# Patient Record
Sex: Female | Born: 1940 | Race: White | Hispanic: No | Smoking: Former smoker
Health system: Southern US, Community
[De-identification: ages and names within clinical notes are randomized; demographics above are authoritative.]

## PROBLEM LIST (undated history)

## (undated) DIAGNOSIS — I4891 Unspecified atrial fibrillation: Secondary | ICD-10-CM

## (undated) DIAGNOSIS — R002 Palpitations: Secondary | ICD-10-CM

## (undated) DIAGNOSIS — G14 Postpolio syndrome: Secondary | ICD-10-CM

## (undated) DIAGNOSIS — I499 Cardiac arrhythmia, unspecified: Secondary | ICD-10-CM

## (undated) DIAGNOSIS — I471 Supraventricular tachycardia: Secondary | ICD-10-CM

## (undated) DIAGNOSIS — A159 Respiratory tuberculosis unspecified: Secondary | ICD-10-CM

## (undated) DIAGNOSIS — J329 Chronic sinusitis, unspecified: Secondary | ICD-10-CM

## (undated) DIAGNOSIS — I10 Essential (primary) hypertension: Secondary | ICD-10-CM

## (undated) DIAGNOSIS — F419 Anxiety disorder, unspecified: Secondary | ICD-10-CM

## (undated) DIAGNOSIS — M1990 Unspecified osteoarthritis, unspecified site: Secondary | ICD-10-CM

## (undated) DIAGNOSIS — K219 Gastro-esophageal reflux disease without esophagitis: Secondary | ICD-10-CM

## (undated) DIAGNOSIS — I4719 Other supraventricular tachycardia: Secondary | ICD-10-CM

## (undated) DIAGNOSIS — F319 Bipolar disorder, unspecified: Secondary | ICD-10-CM

## (undated) DIAGNOSIS — E039 Hypothyroidism, unspecified: Secondary | ICD-10-CM

## (undated) HISTORY — DX: Supraventricular tachycardia: I47.1

## (undated) HISTORY — DX: Palpitations: R00.2

## (undated) HISTORY — DX: Other supraventricular tachycardia: I47.19

## (undated) HISTORY — PX: BREAST EXCISIONAL BIOPSY: SUR124

## (undated) HISTORY — PX: ABDOMINAL HYSTERECTOMY: SHX81

## (undated) HISTORY — PX: CATARACT EXTRACTION W/ INTRAOCULAR LENS  IMPLANT, BILATERAL: SHX1307

## (undated) HISTORY — PX: THYROIDECTOMY: SHX17

## (undated) HISTORY — PX: OTHER SURGICAL HISTORY: SHX169

## (undated) HISTORY — DX: Unspecified atrial fibrillation: I48.91

## (undated) HISTORY — DX: Postpolio syndrome: G14

## (undated) HISTORY — PX: CERVICAL LAMINECTOMY: SHX94

---

## 2003-01-11 ENCOUNTER — Encounter: Payer: Self-pay | Admitting: Family Medicine

## 2003-01-11 ENCOUNTER — Ambulatory Visit (HOSPITAL_COMMUNITY): Admission: RE | Admit: 2003-01-11 | Discharge: 2003-01-11 | Payer: Self-pay | Admitting: Family Medicine

## 2004-01-17 ENCOUNTER — Ambulatory Visit (HOSPITAL_COMMUNITY): Admission: RE | Admit: 2004-01-17 | Discharge: 2004-01-17 | Payer: Self-pay | Admitting: Family Medicine

## 2005-01-22 ENCOUNTER — Ambulatory Visit (HOSPITAL_COMMUNITY): Admission: RE | Admit: 2005-01-22 | Discharge: 2005-01-22 | Payer: Self-pay | Admitting: Family Medicine

## 2005-01-29 ENCOUNTER — Encounter: Admission: RE | Admit: 2005-01-29 | Discharge: 2005-01-29 | Payer: Self-pay | Admitting: Family Medicine

## 2006-01-28 ENCOUNTER — Ambulatory Visit (HOSPITAL_COMMUNITY): Admission: RE | Admit: 2006-01-28 | Discharge: 2006-01-28 | Payer: Self-pay | Admitting: Family Medicine

## 2007-02-09 ENCOUNTER — Ambulatory Visit (HOSPITAL_COMMUNITY): Admission: RE | Admit: 2007-02-09 | Discharge: 2007-02-09 | Payer: Self-pay | Admitting: Family Medicine

## 2008-02-11 ENCOUNTER — Ambulatory Visit (HOSPITAL_COMMUNITY): Admission: RE | Admit: 2008-02-11 | Discharge: 2008-02-11 | Payer: Self-pay | Admitting: Family Medicine

## 2008-09-22 ENCOUNTER — Emergency Department (HOSPITAL_COMMUNITY): Admission: EM | Admit: 2008-09-22 | Discharge: 2008-09-22 | Payer: Self-pay | Admitting: Emergency Medicine

## 2009-09-21 ENCOUNTER — Encounter: Admission: RE | Admit: 2009-09-21 | Discharge: 2009-09-21 | Payer: Self-pay | Admitting: Family Medicine

## 2010-06-07 ENCOUNTER — Ambulatory Visit (HOSPITAL_COMMUNITY): Admission: RE | Admit: 2010-06-07 | Discharge: 2010-06-07 | Payer: Self-pay | Admitting: Gastroenterology

## 2010-09-27 ENCOUNTER — Ambulatory Visit (HOSPITAL_COMMUNITY): Admission: RE | Admit: 2010-09-27 | Discharge: 2010-09-27 | Payer: Self-pay | Admitting: Family Medicine

## 2011-08-27 ENCOUNTER — Other Ambulatory Visit (HOSPITAL_COMMUNITY): Payer: Self-pay | Admitting: Family Medicine

## 2011-08-27 DIAGNOSIS — Z1231 Encounter for screening mammogram for malignant neoplasm of breast: Secondary | ICD-10-CM

## 2011-10-03 ENCOUNTER — Ambulatory Visit (HOSPITAL_COMMUNITY): Payer: Self-pay

## 2011-11-05 ENCOUNTER — Ambulatory Visit (HOSPITAL_COMMUNITY)
Admission: RE | Admit: 2011-11-05 | Discharge: 2011-11-05 | Disposition: A | Discharge status: Home / Self Care | Payer: BC Managed Care – PPO | Source: Ambulatory Visit | Attending: Family Medicine | Admitting: Family Medicine

## 2011-11-05 DIAGNOSIS — Z1231 Encounter for screening mammogram for malignant neoplasm of breast: Secondary | ICD-10-CM | POA: Insufficient documentation

## 2012-09-25 ENCOUNTER — Other Ambulatory Visit (HOSPITAL_COMMUNITY): Payer: Self-pay | Admitting: Family Medicine

## 2012-09-25 DIAGNOSIS — Z1231 Encounter for screening mammogram for malignant neoplasm of breast: Secondary | ICD-10-CM

## 2012-11-05 ENCOUNTER — Ambulatory Visit (HOSPITAL_COMMUNITY)
Admission: RE | Admit: 2012-11-05 | Discharge: 2012-11-05 | Disposition: A | Discharge status: Home / Self Care | Payer: BC Managed Care – PPO | Source: Ambulatory Visit | Attending: Family Medicine | Admitting: Family Medicine

## 2012-11-05 DIAGNOSIS — Z1231 Encounter for screening mammogram for malignant neoplasm of breast: Secondary | ICD-10-CM | POA: Insufficient documentation

## 2013-06-16 ENCOUNTER — Other Ambulatory Visit: Payer: Self-pay | Admitting: Gastroenterology

## 2013-06-16 DIAGNOSIS — R1310 Dysphagia, unspecified: Secondary | ICD-10-CM

## 2013-06-17 ENCOUNTER — Ambulatory Visit
Admission: RE | Admit: 2013-06-17 | Discharge: 2013-06-17 | Disposition: A | Discharge status: Home / Self Care | Payer: BC Managed Care – PPO | Source: Ambulatory Visit | Attending: Gastroenterology | Admitting: Gastroenterology

## 2013-06-17 DIAGNOSIS — R1310 Dysphagia, unspecified: Secondary | ICD-10-CM

## 2013-09-27 ENCOUNTER — Other Ambulatory Visit (HOSPITAL_COMMUNITY): Payer: Self-pay | Admitting: Family Medicine

## 2013-09-27 DIAGNOSIS — Z1231 Encounter for screening mammogram for malignant neoplasm of breast: Secondary | ICD-10-CM

## 2013-10-05 DIAGNOSIS — G14 Postpolio syndrome: Secondary | ICD-10-CM | POA: Insufficient documentation

## 2013-11-09 ENCOUNTER — Ambulatory Visit (HOSPITAL_COMMUNITY)
Admission: RE | Admit: 2013-11-09 | Discharge: 2013-11-09 | Disposition: A | Discharge status: Home / Self Care | Payer: BC Managed Care – PPO | Source: Ambulatory Visit | Attending: Family Medicine | Admitting: Family Medicine

## 2013-11-09 DIAGNOSIS — Z1231 Encounter for screening mammogram for malignant neoplasm of breast: Secondary | ICD-10-CM | POA: Insufficient documentation

## 2013-11-16 ENCOUNTER — Ambulatory Visit (HOSPITAL_COMMUNITY): Payer: BC Managed Care – PPO

## 2014-04-17 ENCOUNTER — Encounter (HOSPITAL_COMMUNITY): Payer: Self-pay | Admitting: Emergency Medicine

## 2014-04-17 ENCOUNTER — Inpatient Hospital Stay (HOSPITAL_COMMUNITY)
Admission: EM | Admit: 2014-04-17 | Discharge: 2014-04-19 | DRG: 310 | Disposition: A | Discharge status: Home / Self Care | Payer: Medicare Other | Attending: Internal Medicine | Admitting: Internal Medicine

## 2014-04-17 DIAGNOSIS — I471 Supraventricular tachycardia: Secondary | ICD-10-CM | POA: Diagnosis present

## 2014-04-17 DIAGNOSIS — E039 Hypothyroidism, unspecified: Secondary | ICD-10-CM | POA: Diagnosis present

## 2014-04-17 DIAGNOSIS — I2510 Atherosclerotic heart disease of native coronary artery without angina pectoris: Secondary | ICD-10-CM

## 2014-04-17 DIAGNOSIS — B91 Sequelae of poliomyelitis: Secondary | ICD-10-CM

## 2014-04-17 DIAGNOSIS — Z87891 Personal history of nicotine dependence: Secondary | ICD-10-CM

## 2014-04-17 DIAGNOSIS — Z882 Allergy status to sulfonamides status: Secondary | ICD-10-CM

## 2014-04-17 DIAGNOSIS — I4891 Unspecified atrial fibrillation: Principal | ICD-10-CM

## 2014-04-17 DIAGNOSIS — Z79899 Other long term (current) drug therapy: Secondary | ICD-10-CM

## 2014-04-17 DIAGNOSIS — I10 Essential (primary) hypertension: Secondary | ICD-10-CM

## 2014-04-17 DIAGNOSIS — Z7902 Long term (current) use of antithrombotics/antiplatelets: Secondary | ICD-10-CM

## 2014-04-17 DIAGNOSIS — R002 Palpitations: Secondary | ICD-10-CM | POA: Diagnosis present

## 2014-04-17 DIAGNOSIS — Z888 Allergy status to other drugs, medicaments and biological substances status: Secondary | ICD-10-CM

## 2014-04-17 DIAGNOSIS — G14 Postpolio syndrome: Secondary | ICD-10-CM

## 2014-04-17 DIAGNOSIS — I4719 Other supraventricular tachycardia: Secondary | ICD-10-CM

## 2014-04-17 HISTORY — DX: Essential (primary) hypertension: I10

## 2014-04-17 NOTE — ED Notes (Signed)
The pt has had tachycardia for years

## 2014-04-17 NOTE — ED Notes (Signed)
The pt started having a fast heart rate while in bed at 2030 tonight  With some lt sided chest pain.  No chest pain at  present

## 2014-04-18 ENCOUNTER — Telehealth: Payer: Self-pay | Admitting: Cardiology

## 2014-04-18 ENCOUNTER — Encounter (HOSPITAL_COMMUNITY): Payer: Self-pay | Admitting: Emergency Medicine

## 2014-04-18 ENCOUNTER — Emergency Department (HOSPITAL_COMMUNITY): Payer: Medicare Other

## 2014-04-18 DIAGNOSIS — G14 Postpolio syndrome: Secondary | ICD-10-CM

## 2014-04-18 DIAGNOSIS — I471 Supraventricular tachycardia: Secondary | ICD-10-CM | POA: Diagnosis present

## 2014-04-18 DIAGNOSIS — I4891 Unspecified atrial fibrillation: Principal | ICD-10-CM

## 2014-04-18 DIAGNOSIS — I2510 Atherosclerotic heart disease of native coronary artery without angina pectoris: Secondary | ICD-10-CM

## 2014-04-18 DIAGNOSIS — R002 Palpitations: Secondary | ICD-10-CM

## 2014-04-18 DIAGNOSIS — I10 Essential (primary) hypertension: Secondary | ICD-10-CM | POA: Diagnosis present

## 2014-04-18 DIAGNOSIS — B91 Sequelae of poliomyelitis: Secondary | ICD-10-CM

## 2014-04-18 DIAGNOSIS — I369 Nonrheumatic tricuspid valve disorder, unspecified: Secondary | ICD-10-CM

## 2014-04-18 LAB — COMPREHENSIVE METABOLIC PANEL
ALT: 14 U/L (ref 0–35)
AST: 29 U/L (ref 0–37)
Albumin: 3.6 g/dL (ref 3.5–5.2)
Alkaline Phosphatase: 81 U/L (ref 39–117)
BUN: 14 mg/dL (ref 6–23)
CALCIUM: 9.2 mg/dL (ref 8.4–10.5)
CO2: 26 mEq/L (ref 19–32)
Chloride: 106 mEq/L (ref 96–112)
Creatinine, Ser: 0.79 mg/dL (ref 0.50–1.10)
GFR calc Af Amer: >90 mL/min (ref >90)
GFR, EST NON AFRICAN AMERICAN: 81 mL/min — AB (ref >90)
Glucose, Bld: 101 mg/dL — ABNORMAL HIGH (ref 70–99)
Potassium: 3.8 mEq/L (ref 3.7–5.3)
SODIUM: 145 meq/L (ref 137–147)
Total Bilirubin: <0.2 mg/dL — ABNORMAL LOW (ref 0.3–1.2)
Total Protein: 7.4 g/dL (ref 6.0–8.3)

## 2014-04-18 LAB — CBC WITH DIFFERENTIAL/PLATELET
BASOS PCT: 0 % (ref 0–1)
Basophils Absolute: 0 10*3/uL (ref 0.0–0.1)
Eosinophils Absolute: 0.2 10*3/uL (ref 0.0–0.7)
Eosinophils Relative: 2 % (ref 0–5)
HCT: 40.2 % (ref 36.0–46.0)
HEMOGLOBIN: 13.4 g/dL (ref 12.0–15.0)
Lymphocytes Relative: 33 % (ref 12–46)
Lymphs Abs: 2.6 10*3/uL (ref 0.7–4.0)
MCH: 32.9 pg (ref 26.0–34.0)
MCHC: 33.3 g/dL (ref 30.0–36.0)
MCV: 98.8 fL (ref 78.0–100.0)
Monocytes Absolute: 0.6 10*3/uL (ref 0.1–1.0)
Monocytes Relative: 7 % (ref 3–12)
NEUTROS PCT: 58 % (ref 43–77)
Neutro Abs: 4.5 10*3/uL (ref 1.7–7.7)
PLATELETS: 189 10*3/uL (ref 150–400)
RBC: 4.07 MIL/uL (ref 3.87–5.11)
RDW: 12.4 % (ref 11.5–15.5)
WBC: 7.8 10*3/uL (ref 4.0–10.5)

## 2014-04-18 LAB — TSH: TSH: 0.748 u[IU]/mL (ref 0.350–4.500)

## 2014-04-18 LAB — TROPONIN I

## 2014-04-18 LAB — D-DIMER, QUANTITATIVE: D-Dimer, Quant: 0.41 ug/mL-FEU (ref 0.00–0.48)

## 2014-04-18 MED ORDER — ATENOLOL 50 MG PO TABS
50.0000 mg | ORAL_TABLET | Freq: Two times a day (BID) | ORAL | Status: DC
  Administered 2014-04-18: 50 mg via ORAL
  Filled 2014-04-18 (×3): qty 1

## 2014-04-18 MED ORDER — TEMAZEPAM 15 MG PO CAPS
30.0000 mg | ORAL_CAPSULE | Freq: Every day | ORAL | Status: DC
  Administered 2014-04-18: 30 mg via ORAL
  Filled 2014-04-18: qty 2

## 2014-04-18 MED ORDER — OMEGA-3-ACID ETHYL ESTERS 1 G PO CAPS
1.0000 g | ORAL_CAPSULE | Freq: Two times a day (BID) | ORAL | Status: DC
  Administered 2014-04-18 (×2): 1 g via ORAL
  Filled 2014-04-18 (×4): qty 1

## 2014-04-18 MED ORDER — DILTIAZEM LOAD VIA INFUSION
10.0000 mg | Freq: Once | INTRAVENOUS | Status: AC
  Administered 2014-04-18: 10 mg via INTRAVENOUS
  Filled 2014-04-18: qty 10

## 2014-04-18 MED ORDER — ZIPRASIDONE HCL 20 MG PO CAPS
20.0000 mg | ORAL_CAPSULE | Freq: Every day | ORAL | Status: DC
  Administered 2014-04-18: 20 mg via ORAL
  Filled 2014-04-18 (×2): qty 1

## 2014-04-18 MED ORDER — SODIUM CHLORIDE 0.9 % IV BOLUS (SEPSIS)
500.0000 mL | Freq: Once | INTRAVENOUS | Status: AC
  Administered 2014-04-18: 500 mL via INTRAVENOUS

## 2014-04-18 MED ORDER — ATENOLOL 25 MG PO TABS
25.0000 mg | ORAL_TABLET | Freq: Once | ORAL | Status: AC
  Administered 2014-04-18: 25 mg via ORAL
  Filled 2014-04-18: qty 1

## 2014-04-18 MED ORDER — ASPIRIN 81 MG PO CHEW
324.0000 mg | CHEWABLE_TABLET | Freq: Once | ORAL | Status: DC
  Filled 2014-04-18: qty 4

## 2014-04-18 MED ORDER — ZIPRASIDONE HCL 20 MG PO CAPS
20.0000 mg | ORAL_CAPSULE | Freq: Every day | ORAL | Status: DC
  Filled 2014-04-18: qty 1

## 2014-04-18 MED ORDER — SODIUM CHLORIDE 0.9 % IV SOLN: 250.0000 mL | INTRAVENOUS | Status: DC | PRN

## 2014-04-18 MED ORDER — ASPIRIN EC 81 MG PO TBEC
81.0000 mg | DELAYED_RELEASE_TABLET | Freq: Every day | ORAL | Status: DC
  Filled 2014-04-18 (×2): qty 1

## 2014-04-18 MED ORDER — ATENOLOL 25 MG PO TABS
25.0000 mg | ORAL_TABLET | Freq: Two times a day (BID) | ORAL | Status: DC
  Administered 2014-04-18: 25 mg via ORAL
  Filled 2014-04-18 (×2): qty 1

## 2014-04-18 MED ORDER — LEVOTHYROXINE SODIUM 100 MCG PO TABS
100.0000 ug | ORAL_TABLET | Freq: Every day | ORAL | Status: DC
  Administered 2014-04-18 – 2014-04-19 (×2): 100 ug via ORAL
  Filled 2014-04-18 (×4): qty 1

## 2014-04-18 MED ORDER — SODIUM CHLORIDE 0.9 % IJ SOLN: 3.0000 mL | INTRAMUSCULAR | Status: DC | PRN

## 2014-04-18 MED ORDER — ACYCLOVIR 400 MG PO TABS
400.0000 mg | ORAL_TABLET | Freq: Every day | ORAL | Status: DC
  Administered 2014-04-18: 400 mg via ORAL
  Filled 2014-04-18 (×3): qty 1

## 2014-04-18 MED ORDER — ATENOLOL 50 MG PO TABS: 50.0000 mg | ORAL_TABLET | Freq: Two times a day (BID) | ORAL | Status: DC

## 2014-04-18 MED ORDER — DILTIAZEM HCL 100 MG IV SOLR
5.0000 mg/h | INTRAVENOUS | Status: DC
  Administered 2014-04-18 (×2): 5 mg/h via INTRAVENOUS

## 2014-04-18 MED ORDER — OFF THE BEAT BOOK
Freq: Once | Status: AC
  Administered 2014-04-18: 14:00:00
  Filled 2014-04-18: qty 1

## 2014-04-18 MED ORDER — SODIUM CHLORIDE 0.9 % IJ SOLN: 3.0000 mL | Freq: Two times a day (BID) | INTRAMUSCULAR | Status: DC

## 2014-04-18 NOTE — Telephone Encounter (Signed)
Per Dr Marlou Porch he will add the pt to 04/20/14 schedule.  The pt is aware of appointment and will call back to get our address as she does not have anything to write on at this time.

## 2014-04-18 NOTE — H&P (Signed)
PCP:   Jonathon Bellows, MD   Chief Complaint:  palpitations  HPI: 73 yo female with h/o PAT usually controlled well with her atenolol, htn, cad comes in with one day of palpitations and she felt her heart really racing fast.  She thought it was her paroxysmal atrial tachycardia some came to the ED.  She has not had a problem with her PAT in years.  Denies any recent illnessess, no fevers/n/v/d.  No sob.  No cp.  No swelling.  No prior h/o irregular heartbeat.  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  She was just loaded and placed on a cardizem gtt, her bp is normal and rate is down to the 80s from 140s.  She no longer feels any palpitations.  Review of Systems:  Positive and negative as per HPI otherwise all other systems are negative  Past Medical History: Past Medical History  Diagnosis Date  . Hypertension   . Coronary artery disease    History reviewed. No pertinent past surgical history.  Medications: Prior to Admission medications   Not on File    Allergies:   Allergies  Allergen Reactions  . Sulfa Antibiotics     Childhood allergy  . Ciprocinonide [Fluocinolone] Rash    Social History:  reports that she has quit smoking. She does not have any smokeless tobacco history on file. She reports that she drinks alcohol. Her drug history is not on file.  Family History: History reviewed. No pertinent family history.  Physical Exam: Filed Vitals:   04/18/14 0200 04/18/14 0215 04/18/14 0218 04/18/14 0219  BP: 112/95 94/36 99/60    Pulse: 77 75 75 91  Temp:      TempSrc:      Resp: 19 21 22 20   Height:      Weight:      SpO2: 96% 96% 95% 90%   General appearance: alert, cooperative and no distress Head: Normocephalic, without obvious abnormality, atraumatic Eyes: negative Nose: Nares normal. Septum midline. Mucosa  normal. No drainage or sinus tenderness. Neck: no JVD and supple, symmetrical, trachea midline Lungs: clear to auscultation bilaterally Heart: irregularly irregular rhythm Abdomen: soft, non-tender; bowel sounds normal; no masses,  no organomegaly Extremities: extremities normal, atraumatic, no cyanosis or edema Pulses: 2+ and symmetric Skin: Skin color, texture, turgor normal. No rashes or lesions Neurologic: Grossly normal    Labs on Admission:   Recent Labs  04/17/14 2352  NA 145  K 3.8  CL 106  CO2 26  GLUCOSE 101*  BUN 14  CREATININE 0.79  CALCIUM 9.2    Recent Labs  04/17/14 2352  AST 29  ALT 14  ALKPHOS 81  BILITOT <0.2*  PROT 7.4  ALBUMIN 3.6    Recent Labs  04/17/14 2352  WBC 7.8  NEUTROABS 4.5  HGB 13.4  HCT 40.2  MCV 98.8  PLT 189    Recent Labs  04/17/14 2352  TROPONINI <0.30   Radiological Exams on Admission: Dg Chest 2 View  04/18/2014   CLINICAL DATA:  TACHYCARDIA  EXAM: CHEST  2 VIEW  COMPARISON:  None available  FINDINGS: The cardiac and mediastinal silhouettes are within normal limits.  The lungs are normally inflated. No airspace consolidation, pleural effusion, or pulmonary edema is identified. There is no pneumothorax.  No acute osseous abnormality identified.  IMPRESSION: No  acute cardiopulmonary abnormality.   Electronically Signed   By: Jeannine Boga M.D.   On: 04/18/2014 00:44    Assessment/Plan  73 yo female with new onset atrial fib with rvr with also h/o PAT/htn/CAD  Principal Problem:   Atrial fibrillation with RVR-  Will give an extra dose of her atenolol right now, and hopefully will be able to wean her cardizem quickly tonight, she is only on 5mg  /hr right now.  No evidence of chf /pulm edema.  Will ck echo in am.  Can have further f/u with her cardiologist if we can get her rate controlled tonight and possible d/c tom.  Her [place] score is 1.  Would put on aspirin.  Will also ck tsh level.  Active Problems:  Stable  unless o/w noted.   Palpitations   Coronary artery disease   Hypertension   PAT (paroxysmal atrial tachycardia)   Post-polio syndrome  obs to stepdown due to drip.  If can titrate off in next several hours in the ED (no stepdown beds) will downgrade to tele bed.   Full code.   Shuronda Santino A Shanon Brow 04/18/2014, 2:25 AM

## 2014-04-18 NOTE — Progress Notes (Signed)
  Echocardiogram 2D Echocardiogram has been performed.  Carney Corners 04/18/2014, 1:39 PM

## 2014-04-18 NOTE — ED Notes (Addendum)
Pt converted to NSR.  Admitting MD paged and responded.  Pt continues to deny chest pain.

## 2014-04-18 NOTE — Progress Notes (Addendum)
[facility] TEAM 1 - Stepdown/ICU TEAM Progress Note  Kaitlyn Hoover HCW:[id number] DOB: 1941-03-29 DOA: 04/17/2014 PCP: Jonathon Bellows, MD  Admit HPI / Brief Narrative: 73 yo female with hx of PAT usually controlled well with atenolol, HTN, and CAD who came in with a one day hx of palpitations. She came to the ED. She had not had a problem with her PAT in years. Denied any recent illnessess, no fevers, n/v/d, sob, or cp. No swelling. She was loaded and placed on a cardizem gtt.  HPI/Subjective: Pt is seen for a f/u visit.  Assessment/Plan:  Afib w/ RVR - prior hx of PAT Has converted back to NSR - off cardizem gtt - has been scheduled to f/u w/ Dr. Marlou Porch at his office on 04/20/14 as a work in - TSH normal/at goal - lytes normal - D-dimer normal - monitor tele overnight - CHADS2VASC score is 3, suggesting anticoag would be appropriate should she return to afib or prove to have parox afib  HTN Watch BP to assure it tolerates increased atenolol dosing  Hypothyroid  TSH at goal - no change in dosing   Reported hx of CAD No chest pain  Post-polio syndrome  Code Status: FULL Family Communication: no family present at time of exam Disposition Plan: probable d/c home 6/2 if remains in NSR w/ Cardiology f/u as discussed above on 04/20/14  Consultants: none  Procedures: TTE - 04/18/14 - pending  Antibiotics: none  DVT prophylaxis: SCDs  Objective: Blood pressure 143/60, pulse 57, temperature 97.5 F (36.4 C), temperature source Oral, resp. rate 16, height 5\' 3"  (1.6 m), weight 56.79 kg (125 lb 3.2 oz), SpO2 100.00%.  Intake/Output Summary (Last 24 hours) at 04/18/14 1716 Last data filed at 04/18/14 0359  Gross per 24 hour  Intake      0 ml  Output    300 ml  Net   -300 ml   Exam: F/U exam completed  Data Reviewed: Basic Metabolic Panel:  Recent Labs Lab 04/17/14 2352  NA 145  K 3.8  CL 106  CO2 26  GLUCOSE 101*  BUN 14  CREATININE 0.79  CALCIUM 9.2   Liver  Function Tests:  Recent Labs Lab 04/17/14 2352  AST 29  ALT 14  ALKPHOS 81  BILITOT <0.2*  PROT 7.4  ALBUMIN 3.6   CBC:  Recent Labs Lab 04/17/14 2352  WBC 7.8  NEUTROABS 4.5  HGB 13.4  HCT 40.2  MCV 98.8  PLT 189   Cardiac Enzymes:  Recent Labs Lab 04/17/14 2352  TROPONINI <0.30   Studies:  Recent x-ray studies have been reviewed in detail by the Attending Physician  Scheduled Meds:  Scheduled Meds: . acyclovir  400 mg Oral Daily  . aspirin  324 mg Oral Once  . aspirin EC  81 mg Oral Daily  . atenolol  25 mg Oral BID  . levothyroxine  100 mcg Oral Q breakfast  . omega-3 acid ethyl esters  1 g Oral BID  . sodium chloride  3 mL Intravenous Q12H  . temazepam  30 mg Oral Daily  . ziprasidone  20 mg Oral Q2000    Time spent on care of this patient: 25+ mins   Cherene Altes , MD   [facility] Office  [phone] Pager - Text Page per Amion as per below:  On-Call/Text Page:      [email or web address]      password [email or web address]  If 7PM-7AM, please contact night-coverage [email or web address] Password [email or web address]  04/18/2014, 5:16 PM   LOS: 1 day

## 2014-04-18 NOTE — ED Provider Notes (Signed)
CSN: [id number]     Arrival date & time 04/17/14  2329 History   First MD Initiated Contact with Patient 04/17/14 2347     Chief Complaint  Patient presents with  . Tachycardia     (Consider location/radiation/quality/duration/timing/severity/associated sxs/prior Treatment) Patient is a 73 y.o. female presenting with palpitations. The history is provided by the patient. No language interpreter was used.  Palpitations Palpitations quality:  Fast Onset quality:  Sudden Timing:  Constant Progression:  Unchanged Chronicity:  Recurrent Context: not anxiety, not caffeine and not hyperventilation   Relieved by:  Nothing Worsened by:  Nothing tried Ineffective treatments:  None tried Associated symptoms: no back pain, no cough, no leg pain and no near-syncope   Risk factors: no diabetes mellitus     Past Medical History  Diagnosis Date  . Hypertension   . Coronary artery disease    History reviewed. No pertinent past surgical history. No family history on file. History  Substance Use Topics  . Smoking status: Former Research scientist (life sciences)  . Smokeless tobacco: Not on file  . Alcohol Use: Yes   OB History   Grav Para Term Preterm Abortions TAB SAB Ect Mult Living                 Review of Systems  Constitutional: Negative for fever.  Respiratory: Negative for cough.   Cardiovascular: Positive for palpitations. Negative for leg swelling and near-syncope.  Musculoskeletal: Negative for back pain.  All other systems reviewed and are negative.     Allergies  Ciprocinonide and Sulfa antibiotics  Home Medications   Prior to Admission medications   Not on File   BP 101/73  Pulse 106  Temp(Src) 98.2 F (36.8 C) (Oral)  Resp 21  Ht 5\' 3"  (1.6 m)  Wt 125 lb 3.2 oz (56.79 kg)  BMI 22.18 kg/m2  SpO2 98% Physical Exam  Constitutional: She is oriented to person, place, and time. She appears well-developed and well-nourished. No distress.  HENT:  Head: Normocephalic and atraumatic.   Mouth/Throat: Oropharynx is clear and moist.  Eyes: Conjunctivae are normal. Pupils are equal, round, and reactive to light.  Neck: Normal range of motion. Neck supple.  Cardiovascular: An irregular rhythm present. Tachycardia present.   Pulmonary/Chest: Effort normal and breath sounds normal. She has no wheezes. She has no rales.  Abdominal: Soft. Bowel sounds are normal. There is no tenderness. There is no rebound and no guarding.  Musculoskeletal: Normal range of motion. She exhibits no edema and no tenderness.  Neurological: She is alert and oriented to person, place, and time.  Skin: Skin is warm and dry.  Psychiatric: She has a normal mood and affect.    ED Course  Procedures (including critical care time) Labs Review Labs Reviewed  COMPREHENSIVE METABOLIC PANEL - Abnormal; Notable for the following:    Glucose, Bld 101 (*)    Total Bilirubin <0.2 (*)    GFR calc non Af Amer 81 (*)    All other components within normal limits  CBC WITH DIFFERENTIAL  TROPONIN I  D-DIMER, QUANTITATIVE    Imaging Review Dg Chest 2 View  04/18/2014   CLINICAL DATA:  TACHYCARDIA  EXAM: CHEST  2 VIEW  COMPARISON:  None available  FINDINGS: The cardiac and mediastinal silhouettes are within normal limits.  The lungs are normally inflated. No airspace consolidation, pleural effusion, or pulmonary edema is identified. There is no pneumothorax.  No acute osseous abnormality identified.  IMPRESSION: No acute cardiopulmonary abnormality.   Electronically  Signed   By: Jeannine Boga M.D.   On: 04/18/2014 00:44     EKG Interpretation   Date/Time:  Sunday Apr 17 2014 23:36:11 EDT Ventricular Rate:  133 PR Interval:    QRS Duration: 86 QT Interval:  336 QTC Calculation: 500 R Axis:   66 Text Interpretation:  Atrial fibrillation with rapid ventricular response  ST \\T \ T wave changes  Confirmed by [facility]  MD, Sausha Raymond ([id number]) on  04/18/2014 12:37:12 AM      MDM   Final diagnoses:  None     Results for orders placed during the hospital encounter of 04/17/14  CBC WITH DIFFERENTIAL      Result Value Ref Range   WBC 7.8  4.0 - 10.5 K/uL   RBC 4.07  3.87 - 5.11 MIL/uL   Hemoglobin 13.4  12.0 - 15.0 g/dL   HCT 40.2  36.0 - 46.0 %   MCV 98.8  78.0 - 100.0 fL   MCH 32.9  26.0 - 34.0 pg   MCHC 33.3  30.0 - 36.0 g/dL   RDW 12.4  11.5 - 15.5 %   Platelets 189  150 - 400 K/uL   Neutrophils Relative % 58  43 - 77 %   Neutro Abs 4.5  1.7 - 7.7 K/uL   Lymphocytes Relative 33  12 - 46 %   Lymphs Abs 2.6  0.7 - 4.0 K/uL   Monocytes Relative 7  3 - 12 %   Monocytes Absolute 0.6  0.1 - 1.0 K/uL   Eosinophils Relative 2  0 - 5 %   Eosinophils Absolute 0.2  0.0 - 0.7 K/uL   Basophils Relative 0  0 - 1 %   Basophils Absolute 0.0  0.0 - 0.1 K/uL  COMPREHENSIVE METABOLIC PANEL      Result Value Ref Range   Sodium 145  137 - 147 mEq/L   Potassium 3.8  3.7 - 5.3 mEq/L   Chloride 106  96 - 112 mEq/L   CO2 26  19 - 32 mEq/L   Glucose, Bld 101 (*) 70 - 99 mg/dL   BUN 14  6 - 23 mg/dL   Creatinine, Ser 0.79  0.50 - 1.10 mg/dL   Calcium 9.2  8.4 - 10.5 mg/dL   Total Protein 7.4  6.0 - 8.3 g/dL   Albumin 3.6  3.5 - 5.2 g/dL   AST 29  0 - 37 U/L   ALT 14  0 - 35 U/L   Alkaline Phosphatase 81  39 - 117 U/L   Total Bilirubin <0.2 (*) 0.3 - 1.2 mg/dL   GFR calc non Af Amer 81 (*) >90 mL/min   GFR calc Af Amer >90  >90 mL/min  TROPONIN I      Result Value Ref Range   Troponin I <0.30  <0.30 ng/mL  D-DIMER, QUANTITATIVE      Result Value Ref Range   D-Dimer, Quant 0.41  0.00 - 0.48 ug/mL-FEU   Dg Chest 2 View  04/18/2014   CLINICAL DATA:  TACHYCARDIA  EXAM: CHEST  2 VIEW  COMPARISON:  None available  FINDINGS: The cardiac and mediastinal silhouettes are within normal limits.  The lungs are normally inflated. No airspace consolidation, pleural effusion, or pulmonary edema is identified. There is no pneumothorax.  No acute osseous abnormality identified.  IMPRESSION: No acute  cardiopulmonary abnormality.   Electronically Signed   By: Jeannine Boga M.D.   On: 04/18/2014 00:44   Medications  diltiazem (CARDIZEM)  1 mg/mL load via infusion 10 mg (not administered)    And  diltiazem (CARDIZEM) 100 mg in dextrose 5 % 100 mL infusion (not administered)  aspirin chewable tablet 324 mg (not administered)       Aniah Pauli Alfonso Patten, MD 04/18/14 7846

## 2014-04-18 NOTE — Telephone Encounter (Signed)
New message          Pt has a new onset of afib and will be discharged from the hospital on tomorrow / pt states she is a dr Marlou Porch pt and would like to make an appt asap with him / there are no open slots this week and he is not here next week / Can you work pt in?

## 2014-04-19 MED ORDER — CLOPIDOGREL BISULFATE 75 MG PO TABS: 75.0000 mg | ORAL_TABLET | Freq: Every day | ORAL | Status: DC

## 2014-04-19 MED ORDER — ATENOLOL 50 MG PO TABS: 50.0000 mg | ORAL_TABLET | Freq: Two times a day (BID) | ORAL | Status: DC

## 2014-04-19 NOTE — Discharge Summary (Signed)
Physician Discharge Summary  Kaitlyn Hoover UYQ:[id number] DOB: 03-10-1941 DOA: 04/17/2014  PCP: [name], D, MD  Admit date: 04/17/2014 Discharge date: 04/19/2014  Recommendations for Outpatient Follow-up:  1. Pt will need to follow up with PCP in 2 weeks post discharge 2. Please obtain BMP  3. F/u Dr. Marlou Porch on 04/20/14 @230PM    Discharge Diagnoses:  Afib w/ RVR - prior hx of PAT  -Initially started on Cardizem drip with spontaneous conversion to NSR -off cardizem gtt - has been scheduled to f/u w/ Dr. Marlou Porch at his office on 04/20/14@ 2:30pm  -lytes normal -  -D-dimer normal -CHADS2VASC score is 3,  -Discussed the risks, benefits, and alternatives of anticoagulation--she is hesitant to start anticoagulation at this time--she wishes to followup with her cardiologist Dr. Marlou Porch to make final determination -Patient has been ASA due to prior hx of GI irritation and N/V -Agreeable to starting plavix-->home with plavix  -Atenolol has been increased to 50 mg twice a day for blood pressure as well as HR HTN  -Patient is tolerating increased atenolol dose Hypothyroid  TSH at goal--0.748 Reported hx of CAD?  No chest pain  Post-polio syndrome   Discharge Condition: Stable  Disposition:  Follow-up Information   Follow up with Candee Furbish, MD. (April 20, 2014 @ 230PM)    Specialty:  Cardiology   Contact information:   [address] [place] [address] [phone]      home  Diet: Heart healthy Wt Readings from Last 3 Encounters:  04/17/14 56.79 kg (125 lb 3.2 oz)    History of present illness:  73 year old female with a history of PAT, hypertension, hypothyroidism, reported CAD presented to the emergency department due to palpitations. She denies any recent illnesses. There are no fevers, chills, nausea, vomiting, diarrhea, shortness of breath, chest pain, dizziness, syncope.  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 EKG in the emergency department suggested atrial fibrillation with RVR with a heart rate of 131. The patient was started on a Cardizem drip. The patient spontaneously converted to sinus rhythm. Her atenolol was increased to 50 mg twice a day. The patient remained in sinus rhythm. The patient was offered anticoagulation with discussion of the risks, benefits, and alternatives. The patient has an appointment to see her cardiologist, Dr. Michel Santee on 04/20/2014. She would prefer to more definitively discuss the issue with her cardiologist prior to making a definitive decision. The patient refused to take aspirin as it has caused nausea and vomiting and GI irritation in the past. She was started on Plavix. Her heart rate remained controlled and she remained hemodynamically stable.     Discharge Exam: Filed Vitals:   04/19/14 0500  BP: 156/66  Pulse: 76  Temp: 98 F (36.7 C)  Resp: 16   Filed Vitals:   04/18/14 1006 04/18/14 1137 04/18/14 2031 04/19/14 0500  BP: 133/65 143/60 152/78 156/66  Pulse:  57 61 76  Temp: 98 F (36.7 C) 97.5 F (36.4 C) 98 F (36.7 C) 98 F (36.7 C)  TempSrc: Oral Oral Oral Oral  Resp: 17 16 16 16   Height:      Weight:      SpO2: 97% 100% 100% 96%   General: A&O x 3, NAD, pleasant, cooperative Cardiovascular: RRR, no rub, no gallop, no S3 Respiratory: CTAB, no wheeze, no  rhonchi Abdomen:soft, nontender, nondistended, positive bowel sounds Extremities: No edema, No lymphangitis, no petechiae  Discharge Instructions      Discharge Instructions   Diet - low sodium heart healthy    Complete by:  As directed      Increase activity slowly    Complete by:  As directed             Medication List         acyclovir 400 MG tablet  Commonly known as:  ZOVIRAX  Take 400 mg by mouth daily.     atenolol 50 MG tablet  Commonly known as:  TENORMIN    Take 1 tablet (50 mg total) by mouth 2 (two) times daily.     clopidogrel 75 MG tablet  Commonly known as:  PLAVIX  Take 1 tablet (75 mg total) by mouth daily with breakfast.     fexofenadine 180 MG tablet  Commonly known as:  ALLEGRA  Take 180 mg by mouth daily.     levothyroxine 100 MCG tablet  Commonly known as:  SYNTHROID, LEVOTHROID  Take 100 mcg by mouth daily.     omeprazole 20 MG capsule  Commonly known as:  PRILOSEC  Take 20 mg by mouth daily.     temazepam 30 MG capsule  Commonly known as:  RESTORIL  Take 30 mg by mouth daily.     ziprasidone 20 MG capsule  Commonly known as:  GEODON  Take 20 mg by mouth daily.         The results of significant diagnostics from this hospitalization (including imaging, microbiology, ancillary and laboratory) are listed below for reference.    Significant Diagnostic Studies: Dg Chest 2 View  04/18/2014   CLINICAL DATA:  TACHYCARDIA  EXAM: CHEST  2 VIEW  COMPARISON:  None available  FINDINGS: The cardiac and mediastinal silhouettes are within normal limits.  The lungs are normally inflated. No airspace consolidation, pleural effusion, or pulmonary edema is identified. There is no pneumothorax.  No acute osseous abnormality identified.  IMPRESSION: No acute cardiopulmonary abnormality.   Electronically Signed   By: Jeannine Boga M.D.   On: 04/18/2014 00:44     Microbiology: No results found for this or any previous visit (from the past 240 hour(s)).   Labs: Basic Metabolic Panel:  Recent Labs Lab 04/17/14 2352  NA 145  K 3.8  CL 106  CO2 26  GLUCOSE 101*  BUN 14  CREATININE 0.79  CALCIUM 9.2   Liver Function Tests:  Recent Labs Lab 04/17/14 2352  AST 29  ALT 14  ALKPHOS 81  BILITOT <0.2*  PROT 7.4  ALBUMIN 3.6   No results found for this basename: LIPASE, AMYLASE,  in the last 168 hours No results found for this basename: AMMONIA,  in the last 168 hours CBC:  Recent Labs Lab 04/17/14 2352  WBC  7.8  NEUTROABS 4.5  HGB 13.4  HCT 40.2  MCV 98.8  PLT 189   Cardiac Enzymes:  Recent Labs Lab 04/17/14 2352  TROPONINI <0.30   BNP: No components found with this basename: POCBNP,  CBG: No results found for this basename: GLUCAP,  in the last 168 hours  Time coordinating discharge:  Greater than 30 minutes  Signed:  Orson Eva, DO [facility] Pager: [phone] 04/19/2014, 8:37 AM

## 2014-04-20 ENCOUNTER — Other Ambulatory Visit: Payer: Self-pay | Admitting: *Deleted

## 2014-04-20 ENCOUNTER — Ambulatory Visit (INDEPENDENT_AMBULATORY_CARE_PROVIDER_SITE_OTHER): Payer: Medicare Other | Admitting: Cardiology

## 2014-04-20 ENCOUNTER — Encounter: Payer: Self-pay | Admitting: *Deleted

## 2014-04-20 VITALS — BP 150/80 | HR 60 | Ht 63.0 in | Wt 125.8 lb

## 2014-04-20 DIAGNOSIS — I4891 Unspecified atrial fibrillation: Secondary | ICD-10-CM

## 2014-04-20 DIAGNOSIS — I10 Essential (primary) hypertension: Secondary | ICD-10-CM

## 2014-04-20 MED ORDER — APIXABAN 5 MG PO TABS: 5.0000 mg | ORAL_TABLET | Freq: Two times a day (BID) | ORAL | Status: DC

## 2014-04-20 MED ORDER — ATENOLOL 50 MG PO TABS: 50.0000 mg | ORAL_TABLET | Freq: Two times a day (BID) | ORAL | Status: DC

## 2014-04-20 NOTE — Patient Instructions (Signed)
Your physician recommends that you return for lab work in: 6 months : CBC,BMET  Your physician has recommended you make the following change in your medication:  1.  START ELIQUIS 5 MG TWICE DAILY 2. STOP PLAVIX  Your physician recommends that you schedule a follow-up appointment in: [facility]   Your physician wants you to follow-up in: 6 MONTHS WITH DR. Marlou Porch You will receive a reminder letter in the mail two months in advance. If you don't receive a letter, please call our office to schedule the follow-up appointment.

## 2014-04-20 NOTE — Progress Notes (Signed)
Utilization review completed- retro 

## 2014-04-20 NOTE — Progress Notes (Signed)
[facility] [address] [place], [place]  [address] Phone: [phone] Fax:  [phone]  Date:  04/20/2014   ID:  Kaitlyn Hoover, DOB 04/18/1941, MRN [id number]  PCP:  Jonathon Bellows, MD   History of Present Illness: Kaitlyn Hoover is a 73 y.o. female Here for the evaluation of atrial fibrillation with recent hospitalization, discharged on 04/19/14. She was initially started on Cardizem drip with spontaneous conversion to sinus rhythm. Lab work was normal. AFIB was 133.  Usually has SVT which can be terminated with Valsalva. Tachy is usually quite fast.   This atrial fibrillation episode was symptomatic for her, he was not prompted by any fevers, recent surgeries, thyroid issues. She has one glass of one per night.  After she converted, she felt well.  Wt Readings from Last 3 Encounters:  04/20/14 125 lb 12.8 oz (57.063 kg)  04/17/14 125 lb 3.2 oz (56.79 kg)     Past Medical History  Diagnosis Date  . Hypertension   . Coronary artery disease   . PAT (paroxysmal atrial tachycardia)   . Atrial fibrillation with RVR   . Post-polio syndrome   . Palpitations     No past surgical history on file.  Current Outpatient Prescriptions  Medication Sig Dispense Refill  . acyclovir (ZOVIRAX) 400 MG tablet Take 400 mg by mouth daily.       . Ascorbic Acid (VITAMIN C) 1000 MG tablet Take 1,000 mg by mouth daily.      Marland Kitchen atenolol (TENORMIN) 50 MG tablet Take 1 tablet (50 mg total) by mouth 2 (two) times daily.  60 tablet  0  . b complex vitamins tablet Take 1 tablet by mouth daily.      . calcium carbonate 1250 MG capsule Take 1,250 mg by mouth 2 (two) times daily with a meal.      . cholecalciferol (VITAMIN D) 1000 UNITS tablet Take 1,000 Units by mouth daily.      . fexofenadine (ALLEGRA) 180 MG tablet Take 180 mg by mouth daily.      . Fish Oil-Cholecalciferol (FISH OIL + D3) 1000-1000 MG-UNIT CAPS Take by mouth.      . levothyroxine (SYNTHROID, LEVOTHROID) 100 MCG tablet  Take 100 mcg by mouth daily.      . magnesium 30 MG tablet Take 500 mg by mouth 2 (two) times daily.      Marland Kitchen omeprazole (PRILOSEC) 20 MG capsule Take 20 mg by mouth daily.      . temazepam (RESTORIL) 30 MG capsule Take 30 mg by mouth daily.      . ziprasidone (GEODON) 20 MG capsule Take 20 mg by mouth daily.      . clopidogrel (PLAVIX) 75 MG tablet Take 1 tablet (75 mg total) by mouth daily with breakfast.  30 tablet  0   No current facility-administered medications for this visit.    Allergies:    Allergies  Allergen Reactions  . Sulfa Antibiotics     Childhood allergy  . Ciprocinonide [Fluocinolone] Rash    Social History:  The patient  reports that she has quit smoking. She does not have any smokeless tobacco history on file. She reports that she drinks alcohol.   No family history on file.Mother died in 49's from accident. Has HTN.   ROS:  Please see the history of present illness.      All other systems reviewed and negative.   PHYSICAL EXAM: VS:  BP 150/80  Pulse 60  Ht 5\' 3"  (1.6 m)  Wt 125 lb 12.8 oz (57.063 kg)  BMI 22.29 kg/m2 Well nourished, thin in no acute distress HEENT: normal, [place]/AT, EOMI, hearing aids, mild speech impairment Neck: no JVD, normal carotid upstroke, no bruit Cardiac:  normal S1, S2; RRR; no murmur Lungs:  clear to auscultation bilaterally, no wheezing, rhonchi or rales Abd: soft, nontender, no hepatomegaly, no bruits Ext: no edema, 2+ distal pulses Skin: warm and dry GU: deferred Neuro: no focal abnormalities noted, AAO x 3  EKG: 04/18/14 at 10:18 AM, sinus bradycardia rate 57, no other abnormalities.   ECHO: 04/18/14 - Left ventricle: The cavity size was normal. Wall thickness was normal. Systolic function was normal. The estimated ejection fraction was in the range of 60% to 65%. Wall motion was normal; there were no regional wall motion abnormalities. - Left atrium: The atrium was mildly dilated. - Atrial septum: No defect or patent foramen  ovale was identified. - Pulmonary arteries: PA peak pressure: 32 mm Hg (S).  Labs: 04/17/14-TSH normal, creatinine 0.79. Prior medical records reviewed  ASSESSMENT AND PLAN:  1. New onset atrial fibrillation, paroxysmal with prompt conversion. CHADS-VASc at least 3 (Female, AGE, HTN). We will start Eliquis. Stopped Plavix. See her back in one month to make sure that she is doing well with the medication. Risks and benefits of this medication and explained including bleeding. Rationale behind anticoagulation explained. Echocardiogram with mild left atrial enlargement. Overall reassuring. I will see her back in 6 months. If atrial fibrillation once again returns, she may take an extra atenolol tablet, lay down. Please let us know.  2. Chronic anticoagulation-we are going to start Eliquis. Creatinine, hemoglobin previously normal. Recheck blood work in 6 months. Stopping Plavix.  Signed, Candee Furbish, MD [facility]  04/20/2014 2:52 PM

## 2014-04-29 ENCOUNTER — Telehealth: Payer: Self-pay | Admitting: Cardiology

## 2014-04-29 NOTE — Telephone Encounter (Signed)
PA to [facility] for eliquis 

## 2014-04-29 NOTE — Telephone Encounter (Addendum)
New message     Ins needs prior authorization for eliquis---[facility]--[phone]

## 2014-05-06 ENCOUNTER — Other Ambulatory Visit: Payer: Self-pay | Admitting: *Deleted

## 2014-05-06 MED ORDER — APIXABAN 5 MG PO TABS: 5.0000 mg | ORAL_TABLET | Freq: Two times a day (BID) | ORAL | Status: DC

## 2014-05-09 ENCOUNTER — Telehealth: Payer: Self-pay | Admitting: *Deleted

## 2014-05-09 NOTE — Telephone Encounter (Signed)
PA to [facility] for eliquis

## 2014-05-09 NOTE — Telephone Encounter (Signed)
Received reply back from [place] patient has already been approved FOR ELIQUIS

## 2014-05-10 NOTE — Telephone Encounter (Signed)
PA [id number] approval for eliquis

## 2014-05-16 ENCOUNTER — Encounter: Payer: Self-pay | Admitting: *Deleted

## 2014-05-19 ENCOUNTER — Ambulatory Visit (INDEPENDENT_AMBULATORY_CARE_PROVIDER_SITE_OTHER): Payer: Medicare Other | Admitting: Physician Assistant

## 2014-05-19 ENCOUNTER — Encounter: Payer: Self-pay | Admitting: Physician Assistant

## 2014-05-19 VITALS — BP 121/81 | HR 57 | Ht 63.0 in | Wt 127.0 lb

## 2014-05-19 DIAGNOSIS — I480 Paroxysmal atrial fibrillation: Secondary | ICD-10-CM

## 2014-05-19 DIAGNOSIS — I10 Essential (primary) hypertension: Secondary | ICD-10-CM

## 2014-05-19 DIAGNOSIS — I4891 Unspecified atrial fibrillation: Secondary | ICD-10-CM

## 2014-05-19 NOTE — Progress Notes (Signed)
Cardiology Office Note    Date:  05/19/2014   ID:  BENITA BOONSTRA, DOB 27-Sep-1941, MRN [id number]  PCP:  Jonathon Bellows, MD  Cardiologist:  Dr. Candee Furbish      History of Present Illness: Kaitlyn Hoover is a 73 y.o. female with a hx of HTN, hypothyroidism, SVT who was admitted last month with paroxysmal AFib that converted spontaneously with rate control therapy.  CHADS2-VASc=3.  She followed up with Dr. Candee Furbish and was started on Eliquis.  She returns for follow up.   She is doing well. The patient denies chest pain, shortness of breath, syncope, orthopnea, PND or significant pedal edema.    Studies:  - Echo (04/18/14):  EF 60-65%, normal wall motion, mild LAE, PASP 32 mm Hg   Recent Labs: 04/17/2014: ALT 14; Creatinine 0.79; Hemoglobin 13.4; Potassium 3.8  04/18/2014: TSH 0.748   Wt Readings from Last 3 Encounters:  05/19/14 127 lb (57.607 kg)  04/20/14 125 lb 12.8 oz (57.063 kg)  04/17/14 125 lb 3.2 oz (56.79 kg)     Past Medical History  Diagnosis Date  . Hypertension   . PAT (paroxysmal atrial tachycardia)   . Atrial fibrillation with RVR   . Post-polio syndrome   . Palpitations     Current Outpatient Prescriptions  Medication Sig Dispense Refill  . acyclovir (ZOVIRAX) 400 MG tablet Take 400 mg by mouth daily.       Marland Kitchen apixaban (ELIQUIS) 5 MG TABS tablet Take 1 tablet (5 mg total) by mouth 2 (two) times daily.  10 tablet  0  . Ascorbic Acid (VITAMIN C) 1000 MG tablet Take 1,000 mg by mouth daily.      Marland Kitchen atenolol (TENORMIN) 50 MG tablet Take 1 tablet (50 mg total) by mouth 2 (two) times daily.  180 tablet  3  . b complex vitamins tablet Take 1 tablet by mouth daily.      . calcium carbonate 1250 MG capsule Take 1,250 mg by mouth 2 (two) times daily with a meal.      . cholecalciferol (VITAMIN D) 1000 UNITS tablet Take 1,000 Units by mouth daily.      . Coenzyme Q10 (CO Q-10) 100 MG CAPS Take 100 mLs by mouth daily.      . fexofenadine (ALLEGRA) 180 MG tablet  Take 180 mg by mouth daily.      Marland Kitchen levothyroxine (SYNTHROID, LEVOTHROID) 100 MCG tablet Take 100 mcg by mouth daily.      . magnesium 30 MG tablet Take 500 mg by mouth daily.       . Melatonin 5 MG CAPS Take 5 mg by mouth at bedtime.      . Omega-3 Fatty Acids (FISH OIL BURP-LESS) 1000 MG CAPS Take 3,000 Units by mouth 2 (two) times daily.      Marland Kitchen omeprazole (PRILOSEC) 20 MG capsule Take 20 mg by mouth daily.      . temazepam (RESTORIL) 30 MG capsule Take 30 mg by mouth daily.      . ziprasidone (GEODON) 20 MG capsule Take 20 mg by mouth daily.       No current facility-administered medications for this visit.    Allergies:   Sulfa antibiotics and Ciprocinonide   Social History:  The patient  reports that she has quit smoking. She does not have any smokeless tobacco history on file. She reports that she drinks alcohol.   Family History:  The patient's family history is not on file.  ROS:  Please see the history of present illness.   No bleeding problems.   All other systems reviewed and negative.   PHYSICAL EXAM: VS:  BP 121/81  Pulse 57  Ht 5\' 3"  (1.6 m)  Wt 127 lb (57.607 kg)  BMI 22.50 kg/m2 Well nourished, well developed, in no acute distress HEENT: normal Neck: no JVD Cardiac:  normal S1, S2; RRR; no murmur Lungs:  clear to auscultation bilaterally, no wheezing, rhonchi or rales Abd: soft, nontender, no hepatomegaly Ext: no edema Skin: warm and dry Neuro:  CNs 2-12 intact, no focal abnormalities noted  EKG:  Sinus bradycardia, HR 57, normal axis, nonspecific ST-T wave changes, no change from prior tracing     ASSESSMENT AND PLAN:  1. Paroxysmal atrial fibrillation:  Maintaining NSR.  Continue current dose of Atenolol and Eliquis.  She is tolerating Eliquis without any bleeding problems.  She will follow up with Dr. Candee Furbish as planned. 2. Essential hypertension:  Controlled. 3. Disposition:  F/u with Dr. Candee Furbish in 10/2014.    Signed, Versie Starks,  M[facility] 05/19/2014 2:27 PM    [facility] [place], [place],   [address] Phone: [phone]; Fax: [phone]

## 2014-05-19 NOTE — Patient Instructions (Signed)
Keep follow up and lab work with Dr. Candee Furbish in 10/2014 as planned. You should receive a reminder in the mail to schedule this about 2 months ahead of time. Return sooner if your symptoms change.

## 2014-06-17 ENCOUNTER — Telehealth: Payer: Self-pay | Admitting: Physician Assistant

## 2014-06-17 NOTE — Telephone Encounter (Signed)
Ekg faxed to [facility] x2 and is unreadable, Katie From their office called back this am asked if it could be Mailed. Placed in Mail To  [facility] [address]  [place] [address] [address]  [phone]

## 2014-08-03 ENCOUNTER — Other Ambulatory Visit: Payer: Self-pay | Admitting: *Deleted

## 2014-08-03 MED ORDER — ATENOLOL 50 MG PO TABS: 50.0000 mg | ORAL_TABLET | Freq: Two times a day (BID) | ORAL | Status: DC

## 2014-08-03 MED ORDER — APIXABAN 5 MG PO TABS: 5.0000 mg | ORAL_TABLET | Freq: Two times a day (BID) | ORAL | Status: DC

## 2014-08-15 ENCOUNTER — Encounter (HOSPITAL_COMMUNITY): Payer: Self-pay | Admitting: Emergency Medicine

## 2014-08-15 ENCOUNTER — Emergency Department (HOSPITAL_COMMUNITY)
Admission: EM | Admit: 2014-08-15 | Discharge: 2014-08-15 | Disposition: A | Discharge status: Home / Self Care | Payer: Medicare Other | Attending: Emergency Medicine | Admitting: Emergency Medicine

## 2014-08-15 DIAGNOSIS — I480 Paroxysmal atrial fibrillation: Secondary | ICD-10-CM

## 2014-08-15 DIAGNOSIS — Z87891 Personal history of nicotine dependence: Secondary | ICD-10-CM | POA: Insufficient documentation

## 2014-08-15 DIAGNOSIS — I10 Essential (primary) hypertension: Secondary | ICD-10-CM | POA: Insufficient documentation

## 2014-08-15 DIAGNOSIS — Z7902 Long term (current) use of antithrombotics/antiplatelets: Secondary | ICD-10-CM | POA: Diagnosis not present

## 2014-08-15 DIAGNOSIS — I4891 Unspecified atrial fibrillation: Secondary | ICD-10-CM | POA: Insufficient documentation

## 2014-08-15 DIAGNOSIS — R000 Tachycardia, unspecified: Secondary | ICD-10-CM | POA: Insufficient documentation

## 2014-08-15 DIAGNOSIS — Z8612 Personal history of poliomyelitis: Secondary | ICD-10-CM | POA: Insufficient documentation

## 2014-08-15 DIAGNOSIS — Z79899 Other long term (current) drug therapy: Secondary | ICD-10-CM | POA: Insufficient documentation

## 2014-08-15 LAB — CBC
HCT: 42.5 % (ref 36.0–46.0)
HEMOGLOBIN: 14.6 g/dL (ref 12.0–15.0)
MCH: 31.9 pg (ref 26.0–34.0)
MCHC: 34.4 g/dL (ref 30.0–36.0)
MCV: 92.8 fL (ref 78.0–100.0)
Platelets: 196 10*3/uL (ref 150–400)
RBC: 4.58 MIL/uL (ref 3.87–5.11)
RDW: 12.4 % (ref 11.5–15.5)
WBC: 7 10*3/uL (ref 4.0–10.5)

## 2014-08-15 LAB — I-STAT TROPONIN, ED: Troponin i, poc: 0 ng/mL (ref 0.00–0.08)

## 2014-08-15 LAB — BASIC METABOLIC PANEL
ANION GAP: 14 (ref 5–15)
BUN: 17 mg/dL (ref 6–23)
CHLORIDE: 102 meq/L (ref 96–112)
CO2: 25 mEq/L (ref 19–32)
CREATININE: 0.7 mg/dL (ref 0.50–1.10)
Calcium: 9.3 mg/dL (ref 8.4–10.5)
GFR calc non Af Amer: 85 mL/min — ABNORMAL LOW (ref >90)
Glucose, Bld: 83 mg/dL (ref 70–99)
Potassium: 4.1 mEq/L (ref 3.7–5.3)
Sodium: 141 mEq/L (ref 137–147)

## 2014-08-15 LAB — MAGNESIUM: Magnesium: 2 mg/dL (ref 1.5–2.5)

## 2014-08-15 MED ORDER — DILTIAZEM HCL 25 MG/5ML IV SOLN
15.0000 mg | Freq: Once | INTRAVENOUS | Status: AC
  Administered 2014-08-15: 15 mg via INTRAVENOUS
  Filled 2014-08-15: qty 5

## 2014-08-15 NOTE — ED Provider Notes (Signed)
CSN: [id number]     Arrival date & time 08/15/14  1837 History   First MD Initiated Contact with Patient 08/15/14 1913     Chief Complaint  Patient presents with  . Atrial Fibrillation     (Consider location/radiation/quality/duration/timing/severity/associated sxs/prior Treatment) Patient is a 73 y.o. female presenting with palpitations. The history is provided by the patient.  Palpitations Palpitations quality:  Irregular Onset quality:  Sudden Duration:  2 hours Timing:  Constant Progression:  Unchanged Chronicity:  Recurrent (hx of Parox Afib, states has had once before, normally in NSR, on Eliquis, follows with [facility] in Cards) Context: not anxiety, not appetite suppressants, not exercise, not hyperventilation, not nicotine and not stimulant use   Relieved by:  Nothing Worsened by:  Nothing tried Ineffective treatments:  None tried Associated symptoms: no back pain, no chest pain, no cough, no leg pain, no lower extremity edema, no nausea, no PND, no shortness of breath, no vomiting and no weakness   Risk factors: hx of atrial fibrillation   Risk factors: no diabetes mellitus, no heart disease, no hx of DVT, no hx of PE and no hypercoagulable state     Past Medical History  Diagnosis Date  . Hypertension   . PAT (paroxysmal atrial tachycardia)   . Atrial fibrillation with RVR   . Post-polio syndrome   . Palpitations    History reviewed. No pertinent past surgical history. No family history on file. History  Substance Use Topics  . Smoking status: Former Research scientist (life sciences)  . Smokeless tobacco: Not on file  . Alcohol Use: Yes   OB History   Grav Para Term Preterm Abortions TAB SAB Ect Mult Living                 Review of Systems  Constitutional: Negative for fever, activity change, appetite change and fatigue.  HENT: Negative for congestion and rhinorrhea.   Eyes: Negative for discharge, redness and itching.  Respiratory: Negative for cough, shortness of breath and  wheezing.   Cardiovascular: Positive for palpitations. Negative for chest pain and PND.  Gastrointestinal: Negative for nausea, vomiting, abdominal pain and diarrhea.  Genitourinary: Negative for dysuria and hematuria.  Musculoskeletal: Negative for back pain.  Skin: Negative for rash and wound.  Neurological: Negative for syncope.      Allergies  Sulfa antibiotics and Ciprocinonide  Home Medications   Prior to Admission medications   Medication Sig Start Date End Date Taking? Authorizing Provider  acyclovir (ZOVIRAX) 400 MG tablet Take 400 mg by mouth daily.  02/05/14  Yes Historical Provider, MD  apixaban (ELIQUIS) 5 MG TABS tablet Take 1 tablet (5 mg total) by mouth 2 (two) times daily. 08/03/14  Yes Candee Furbish, MD  Ascorbic Acid (VITAMIN C) 1000 MG tablet Take 1,000 mg by mouth daily.   Yes Historical Provider, MD  atenolol (TENORMIN) 50 MG tablet Take 1 tablet (50 mg total) by mouth 2 (two) times daily. 08/03/14  Yes Candee Furbish, MD  b complex vitamins tablet Take 1 tablet by mouth daily.   Yes Historical Provider, MD  calcium carbonate 1250 MG capsule Take 1,250 mg by mouth 2 (two) times daily with a meal.   Yes Historical Provider, MD  cholecalciferol (VITAMIN D) 1000 UNITS tablet Take 1,000 Units by mouth daily.   Yes Historical Provider, MD  Coenzyme Q10 (CO Q-10) 100 MG CAPS Take 100 mLs by mouth daily.   Yes Historical Provider, MD  fexofenadine (ALLEGRA) 180 MG tablet Take 180 mg by mouth  daily.   Yes Historical Provider, MD  levothyroxine (SYNTHROID, LEVOTHROID) 100 MCG tablet Take 100 mcg by mouth daily. 02/16/14  Yes Historical Provider, MD  Melatonin 5 MG CAPS Take 5 mg by mouth at bedtime.   Yes Historical Provider, MD  Omega-3 Fatty Acids (FISH OIL BURP-LESS) 1000 MG CAPS Take 3,000 Units by mouth 2 (two) times daily.   Yes Historical Provider, MD  temazepam (RESTORIL) 30 MG capsule Take 15 mg by mouth daily.  02/04/14  Yes Historical Provider, MD  ziprasidone (GEODON) 20  MG capsule Take 20 mg by mouth daily.   Yes Historical Provider, MD   BP 126/92  Pulse 93  Resp 24  Ht 5\' 2"  (1.575 m)  Wt 120 lb (54.432 kg)  BMI 21.94 kg/m2  SpO2 97% Physical Exam  Constitutional: She is oriented to person, place, and time. She appears well-developed and well-nourished. No distress.  HENT:  Head: Normocephalic and atraumatic.  Mouth/Throat: Oropharynx is clear and moist. No oropharyngeal exudate.  Eyes: Conjunctivae and EOM are normal. Pupils are equal, round, and reactive to light. Right eye exhibits no discharge. Left eye exhibits no discharge. No scleral icterus.  Neck: Normal range of motion. Neck supple.  Cardiovascular: Normal heart sounds.   No murmur heard. Tachy, irreg irreg  Pulmonary/Chest: Effort normal and breath sounds normal. No respiratory distress. She has no wheezes. She has no rales.  Abdominal: Soft. She exhibits no distension and no mass. There is no tenderness.  Neurological: She is alert and oriented to person, place, and time. She exhibits normal muscle tone. Coordination normal.  Skin: Skin is warm. No rash noted. She is not diaphoretic.    ED Course  Procedures (including critical care time) Labs Review Labs Reviewed  BASIC METABOLIC PANEL - Abnormal; Notable for the following:    GFR calc non Af Amer 85 (*)    All other components within normal limits  CBC  MAGNESIUM  I-STAT TROPOININ, ED    Imaging Review No results found.   EKG Interpretation   Date/Time:  Monday August 15 2014 18:41:57 EDT Ventricular Rate:  128 PR Interval:    QRS Duration: 84 QT Interval:  320 QTC Calculation: 467 R Axis:   78 Text Interpretation:  Atrial fibrillation with rapid ventricular response  Nonspecific ST abnormality Abnormal QRS-T angle, consider primary T wave  abnormality Abnormal ECG nonspecific ST segments similar to June 2015.  Afib with RVR new Confirmed by GOLDSTON  MD, SCOTT ([id number]) on 08/15/2014  7:14:39 PM      MDM    MDM; 73 y.o. WF w/ PMHx of Parox A fib, HTN, w/ cc: of palpitations. Occurred 2 hours ago. States felt palps. No chest pain, no SOB, no f/c, no abd pain. Hx of Afib. States has had it once before. On eliquis. Follows with [facility]. Here tachy, irreg irreg, stable. EKG with no ischemic change. Neg trop. D/w cards attending Meda Coffee) who recommend single Dilt dose. Given dilt and down to 60-70. Still afib. Watched for a while, patient reading leisurely, no complaints. D/w Cards attending Claiborne Billings) who states if no underlying trigger, and rate controlled, no indication for admission. Patient with no sxs or hx suggesting cause for Afib, likely just paroxysmal course of her afib. Patient has cardiologist. Rate controlled. D/w her on nature of Afib and that she will likely self convert. If she develops RVR again, to come back to ED or if she has chest pain, SOB. She will call Cardiologist in morning. She  takes Atenolol bid and instructed (per her Cards progress note) to take extra dose in AM or PM if she has palpitations tomorrow. Discharged.   Final diagnoses:  Paroxysmal atrial fibrillation    Discharge Medication List as of 08/15/2014  9:45 PM     Candee Furbish, MD [id number] [address] [phone]  Schedule an appointment as soon as possible for a visit   Discharged  Sol Passer, MD 08/16/14 [phone]

## 2014-08-15 NOTE — ED Notes (Signed)
MD at bedside. 

## 2014-08-15 NOTE — ED Notes (Signed)
Pt reports it is time for her to take her evening dose of geodon, Dr. Sunny Schlein informed, OK for pt to take nighttime dose.

## 2014-08-15 NOTE — Discharge Instructions (Signed)

## 2014-08-15 NOTE — ED Notes (Addendum)
Pt here for afib. Started feeling her heartrate going fast about 1730. Denies any cp of SOB with it. Takes atenolol and her HR is usually in the 50's. Pt states she took an atenolol and eliquis before leaving home.

## 2014-08-16 NOTE — ED Provider Notes (Signed)
I saw and evaluated the patient, reviewed the resident's note and I agree with the findings and plan.   EKG Interpretation   Date/Time:  Monday August 15 2014 18:41:57 EDT Ventricular Rate:  128 PR Interval:    QRS Duration: 84 QT Interval:  320 QTC Calculation: 467 R Axis:   78 Text Interpretation:  Atrial fibrillation with rapid ventricular response  Nonspecific ST abnormality Abnormal QRS-T angle, consider primary T wave  abnormality Abnormal ECG nonspecific ST segments similar to June 2015.  Afib with RVR new Confirmed by Nishat Livingston  MD, Manuelita Moxon ([id number]) on 08/15/2014  7:14:39 PM       Patient with recurrent afib, controlled with one dose of IV diltiazem after discussion with cardiology. No drip needed, did not recur. Stable for discharge. No anginal symptoms during this.  Ephraim Hamburger, MD 08/16/14 [phone]

## 2014-10-03 ENCOUNTER — Other Ambulatory Visit (HOSPITAL_COMMUNITY): Payer: Self-pay | Admitting: Family Medicine

## 2014-10-03 DIAGNOSIS — Z1231 Encounter for screening mammogram for malignant neoplasm of breast: Secondary | ICD-10-CM

## 2014-10-17 ENCOUNTER — Ambulatory Visit (INDEPENDENT_AMBULATORY_CARE_PROVIDER_SITE_OTHER): Payer: Medicare Other | Admitting: Cardiology

## 2014-10-17 ENCOUNTER — Encounter: Payer: Self-pay | Admitting: Cardiology

## 2014-10-17 VITALS — BP 120/88 | HR 57 | Ht 62.0 in | Wt 127.0 lb

## 2014-10-17 DIAGNOSIS — I10 Essential (primary) hypertension: Secondary | ICD-10-CM

## 2014-10-17 DIAGNOSIS — I480 Paroxysmal atrial fibrillation: Secondary | ICD-10-CM

## 2014-10-17 DIAGNOSIS — Z79899 Other long term (current) drug therapy: Secondary | ICD-10-CM

## 2014-10-17 LAB — CBC
HCT: 40.6 % (ref 36.0–46.0)
Hemoglobin: 13.4 g/dL (ref 12.0–15.0)
MCHC: 32.9 g/dL (ref 30.0–36.0)
MCV: 95.8 fl (ref 78.0–100.0)
Platelets: 195 10*3/uL (ref 150.0–400.0)
RBC: 4.24 Mil/uL (ref 3.87–5.11)
RDW: 14.2 % (ref 11.5–15.5)
WBC: 7.4 10*3/uL (ref 4.0–10.5)

## 2014-10-17 LAB — BASIC METABOLIC PANEL
BUN: 20 mg/dL (ref 6–23)
CALCIUM: 8.9 mg/dL (ref 8.4–10.5)
CO2: 27 mEq/L (ref 19–32)
Chloride: 103 mEq/L (ref 96–112)
Creatinine, Ser: 0.9 mg/dL (ref 0.4–1.2)
GFR: 67.86 mL/min (ref >60.00)
Glucose, Bld: 91 mg/dL (ref 70–99)
Potassium: 4 mEq/L (ref 3.5–5.1)
SODIUM: 137 meq/L (ref 135–145)

## 2014-10-17 MED ORDER — DILTIAZEM HCL ER COATED BEADS 180 MG PO CP24: 180.0000 mg | ORAL_CAPSULE | Freq: Every day | ORAL | Status: DC

## 2014-10-17 NOTE — Progress Notes (Signed)
[facility]. [address] [place], [place]  [address] Phone: [phone] Fax:  [phone]  Date:  10/17/2014   ID:  Kaitlyn Hoover, DOB [birth year], MRN [id number]  PCP:  Jonathon Bellows, MD   History of Present Illness: Kaitlyn Hoover is a 73 y.o. female Here for the evaluation of atrial fibrillation with recent hospitalization, discharged on 04/19/14. She was initially started on Cardizem drip with spontaneous conversion to sinus rhythm. Lab work was normal. AFIB was 133. Symptomatic. She is a [employer] scientist.   She had approximate 5 episodes of atrial fibrillation since she was first diagnosed. She's been to the emergency room twice for this.  Her brother was diagnosed as well with atrial fibrillation and diltiazem seems to working well for him.  Usually has SVT in the past which can be terminated with Valsalva.   AFIB was not prompted by any fevers, recent surgeries, thyroid issues. She has one glass of wine per night.  After she converted, she felt well.  Had another episode prompting ER visit 08/15/14-extra dose of diltiazem, sent home.  When asked if she had a history of coronary artery disease, she denies. This was listed in her past medical history.  Wt Readings from Last 3 Encounters:  10/17/14 127 lb (57.607 kg)  08/15/14 120 lb (54.432 kg)  05/19/14 127 lb (57.607 kg)     Past Medical History  Diagnosis Date  . Hypertension   . PAT (paroxysmal atrial tachycardia)   . Atrial fibrillation with RVR   . Post-polio syndrome   . Palpitations     No past surgical history on file.  Current Outpatient Prescriptions  Medication Sig Dispense Refill  . acyclovir (ZOVIRAX) 400 MG tablet Take 400 mg by mouth daily.     Marland Kitchen apixaban (ELIQUIS) 5 MG TABS tablet Take 1 tablet (5 mg total) by mouth 2 (two) times daily. 180 tablet 0  . Ascorbic Acid (VITAMIN C) 1000 MG tablet Take 1,000 mg by mouth daily.    Marland Kitchen atenolol (TENORMIN) 50 MG tablet Take 1 tablet (50 mg total) by mouth  2 (two) times daily. 180 tablet 0  . b complex vitamins tablet Take 1 tablet by mouth daily.    . calcium carbonate 1250 MG capsule Take 1,250 mg by mouth 2 (two) times daily with a meal.    . cholecalciferol (VITAMIN D) 1000 UNITS tablet Take 1,000 Units by mouth daily.    . Coenzyme Q10 (CO Q-10) 100 MG CAPS Take 100 mLs by mouth daily.    . fexofenadine (ALLEGRA) 180 MG tablet Take 180 mg by mouth daily.    Marland Kitchen levothyroxine (SYNTHROID, LEVOTHROID) 100 MCG tablet Take 100 mcg by mouth daily.    . Melatonin 5 MG CAPS Take 5 mg by mouth at bedtime.    . Omega-3 Fatty Acids (FISH OIL BURP-LESS) 1000 MG CAPS Take 3,000 Units by mouth 2 (two) times daily.    . temazepam (RESTORIL) 15 MG capsule     . traZODone (DESYREL) 50 MG tablet     . ziprasidone (GEODON) 20 MG capsule Take 20 mg by mouth daily.     No current facility-administered medications for this visit.    Allergies:    Allergies  Allergen Reactions  . Sulfa Antibiotics     Childhood allergy  . Ciprocinonide [Fluocinolone] Rash    Social History:  The patient  reports that she has quit smoking. She does not have any smokeless tobacco  history on file. She reports that she drinks alcohol.   Family History  Problem Relation Age of Onset  . Hypertension Mother   Mother died in 12's from accident. Has HTN.   ROS:  Please see the history of present illness.      All other systems reviewed and negative.   PHYSICAL EXAM: VS:  BP 120/88 mmHg  Pulse 57  Ht 5\' 2"  (1.575 m)  Wt 127 lb (57.607 kg)  BMI 23.22 kg/m2 Well nourished, thin in no acute distress HEENT: normal, [place]/AT, EOMI, hearing aids, mild speech impairment Neck: no JVD, normal carotid upstroke, no bruit Cardiac:  normal S1, S2; RRR; no murmur Lungs:  clear to auscultation bilaterally, no wheezing, rhonchi or rales Abd: soft, nontender, no hepatomegaly, no bruits Ext: no edema, 2+ distal pulses Skin: warm and dry GU: deferred Neuro: no focal abnormalities noted,  AAO x 3  EKG: 04/18/14 at 10:18 AM, sinus bradycardia rate 57, no other abnormalities.   ECHO: 04/18/14 - Left ventricle: The cavity size was normal. Wall thickness was normal. Systolic function was normal. The estimated ejection fraction was in the range of 60% to 65%. Wall motion was normal; there were no regional wall motion abnormalities. - Left atrium: The atrium was mildly dilated. - Atrial septum: No defect or patent foramen ovale was identified. - Pulmonary arteries: PA peak pressure: 32 mm Hg (S).  Labs: 04/17/14-TSH normal, creatinine 0.79. Prior medical records reviewed, ER visit reviewed.   ASSESSMENT AND PLAN:  1. Paroxysmal atrial fibrillation- CHADS-VASc at least 3 (Female, AGE, HTN).  Eliquis. Stopped Plavix previously. She is symptomatic with her episodes. Her brother, was diagnosed with atrial fibrillation around the same time. He seems to be doing very well with diltiazem. I will try diltiazem as well as, stop her atenolol 50 mg twice a day and start diltiazem CD 180 mg once a day. I've also asked her to refrain from drinking a glass of wine nightly. I have answered several questions for her. She must continue her anticoagulation.  Echocardiogram with mild left atrial enlargement. Overall reassuring. I will see her back in 2-4 weeks after changing to diltiazem to ensure that she has a adequate heart rate.  If atrial fibrillation once again returns, I've asked her to keep her atenolol handy, take an extra tablet if her heart rate is increased. Next step would be to use flecainide.Please let us know.  2. Essential hypertension-currently well controlled. Changing medications however. Monitor. 3. Chronic anticoagulation- Eliquis. Creatinine, hemoglobin previously normal. Recheck blood work in 6 months. Stopped Plavix.  Signed, Candee Furbish, MD [facility]  10/17/2014 11:24 AM

## 2014-10-17 NOTE — Patient Instructions (Addendum)
Please stop your Atenolol and start Diltiazem CD 180 mg a day. Continue all other medications as listed.  Please have blood work today (CBC, BMP)  Follow up in 2 to 4 weeks with Dr. Marlou Porch.  Thank you for choosing [facility]!!

## 2014-10-20 ENCOUNTER — Other Ambulatory Visit: Payer: Medicare Other

## 2014-11-02 ENCOUNTER — Encounter: Payer: Self-pay | Admitting: *Deleted

## 2014-11-03 ENCOUNTER — Encounter: Payer: Self-pay | Admitting: Cardiology

## 2014-11-03 ENCOUNTER — Ambulatory Visit (INDEPENDENT_AMBULATORY_CARE_PROVIDER_SITE_OTHER): Payer: Medicare Other | Admitting: Cardiology

## 2014-11-03 VITALS — BP 126/78 | HR 76 | Ht 62.0 in | Wt 127.0 lb

## 2014-11-03 DIAGNOSIS — I10 Essential (primary) hypertension: Secondary | ICD-10-CM

## 2014-11-03 DIAGNOSIS — Z7901 Long term (current) use of anticoagulants: Secondary | ICD-10-CM

## 2014-11-03 DIAGNOSIS — I471 Supraventricular tachycardia: Secondary | ICD-10-CM

## 2014-11-03 DIAGNOSIS — I4719 Other supraventricular tachycardia: Secondary | ICD-10-CM

## 2014-11-03 MED ORDER — DILTIAZEM HCL ER COATED BEADS 180 MG PO CP24: 180.0000 mg | ORAL_CAPSULE | Freq: Every day | ORAL | Status: DC

## 2014-11-03 NOTE — Patient Instructions (Signed)
The current medical regimen is effective;  continue present plan and medications.  Follow up in 4 months with Dr. Skains.  You will receive a letter in the mail 2 months before you are due.  Please call us when you receive this letter to schedule your follow up appointment.  Thank you for choosing [facility]!!     

## 2014-11-03 NOTE — Progress Notes (Signed)
[facility]. [address] [place], [place]  [address] Phone: [phone] Fax:  [phone]  Date:  11/03/2014   ID:  Kaitlyn Hoover, DOB 1940-12-28, MRN [id number]  PCP:  Jonathon Bellows, MD   History of Present Illness: Kaitlyn Hoover is a 73 y.o. female here for atrial fibrillation with recent hospitalization, discharged on 04/19/14. She was initially started on Cardizem drip with spontaneous conversion to sinus rhythm. Lab work was normal. AFIB was 133. Symptomatic. She is a [employer] scientist.   She had approximate 5 episodes of atrial fibrillation since she was first diagnosed. She's been to the emergency room twice for this.  Her brother was diagnosed as well with atrial fibrillation and diltiazem seems to working well for him.  Usually has SVT in the past which can be terminated with Valsalva.   AFIB was not prompted by any fevers, recent surgeries, thyroid issues. She has one glass of wine per night.  After she converted, she felt well.  Had another episode prompting ER visit 08/15/14-extra dose of diltiazem, sent home.  When asked if she had a history of coronary artery disease, she denies. This was listed in her past medical history.  Since putting her on diltiazem 180 mg, she has been doing quite well. She did have one episode which lasted less than 12 hours. During this episode, her heart rate was not quite as fast as it had been in the past. No bleeding, no syncope.  Wt Readings from Last 3 Encounters:  11/03/14 127 lb (57.607 kg)  10/17/14 127 lb (57.607 kg)  08/15/14 120 lb (54.432 kg)     Past Medical History  Diagnosis Date  . Hypertension   . PAT (paroxysmal atrial tachycardia)   . Atrial fibrillation with RVR   . Post-polio syndrome   . Palpitations     Past Surgical History  Procedure Laterality Date  . No surgical hx      Current Outpatient Prescriptions  Medication Sig Dispense Refill  . acyclovir (ZOVIRAX) 400 MG tablet Take 400 mg by mouth daily.      Marland Kitchen apixaban (ELIQUIS) 5 MG TABS tablet Take 1 tablet (5 mg total) by mouth 2 (two) times daily. 180 tablet 0  . Ascorbic Acid (VITAMIN C) 1000 MG tablet Take 1,000 mg by mouth daily.    Marland Kitchen b complex vitamins tablet Take 1 tablet by mouth daily.    . calcium carbonate 1250 MG capsule Take 1,250 mg by mouth 2 (two) times daily with a meal.    . cholecalciferol (VITAMIN D) 1000 UNITS tablet Take 1,000 Units by mouth daily.    . Coenzyme Q10 (CO Q-10) 100 MG CAPS Take 100 mLs by mouth daily.    Marland Kitchen diltiazem (CARDIZEM CD) 180 MG 24 hr capsule Take 1 capsule (180 mg total) by mouth daily. 30 capsule 6  . fexofenadine (ALLEGRA) 180 MG tablet Take 180 mg by mouth daily.    Marland Kitchen levothyroxine (SYNTHROID, LEVOTHROID) 100 MCG tablet Take 100 mcg by mouth daily.    . Melatonin 5 MG CAPS Take 5 mg by mouth at bedtime.    . Omega-3 Fatty Acids (FISH OIL BURP-LESS) 1000 MG CAPS Take 3,000 Units by mouth 2 (two) times daily.    . temazepam (RESTORIL) 15 MG capsule     . traZODone (DESYREL) 50 MG tablet     . ziprasidone (GEODON) 20 MG capsule Take 20 mg by mouth daily.     No  current facility-administered medications for this visit.    Allergies:    Allergies  Allergen Reactions  . Sulfa Antibiotics     Childhood allergy  . Ciprocinonide [Fluocinolone] Rash    Social History:  The patient  reports that she has quit smoking. She does not have any smokeless tobacco history on file. She reports that she drinks alcohol.   Family History  Problem Relation Age of Onset  . Hypertension Mother   Mother died in 70's from accident. Has HTN.   ROS:  Please see the history of present illness.      All other systems reviewed and negative.   PHYSICAL EXAM: VS:  BP 126/78 mmHg  Pulse 76  Ht 5\' 2"  (1.575 m)  Wt 127 lb (57.607 kg)  BMI 23.22 kg/m2 Well nourished, thin in no acute distress HEENT: normal, [place]/AT, EOMI, hearing aids, mild speech impairment Neck: no JVD, normal carotid upstroke, no  bruit Cardiac:  normal S1, S2; RRR; no murmur Lungs:  clear to auscultation bilaterally, no wheezing, rhonchi or rales Abd: soft, nontender, no hepatomegaly, no bruits Ext: no edema, 2+ distal pulses Skin: warm and dry GU: deferred Neuro: no focal abnormalities noted, AAO x 3  EKG: 04/18/14 at 10:18 AM, sinus bradycardia rate 57, no other abnormalities.   ECHO: 04/18/14 - Left ventricle: The cavity size was normal. Wall thickness was normal. Systolic function was normal. The estimated ejection fraction was in the range of 60% to 65%. Wall motion was normal; there were no regional wall motion abnormalities. - Left atrium: The atrium was mildly dilated. - Atrial septum: No defect or patent foramen ovale was identified. - Pulmonary arteries: PA peak pressure: 32 mm Hg (S).  Labs: 04/17/14-TSH normal, creatinine 0.79. Prior medical records reviewed, ER visit reviewed.   ASSESSMENT AND PLAN:  1. Paroxysmal atrial fibrillation- CHADS-VASc at least 3 (Female, AGE, HTN).  Eliquis. Stopped Plavix previously. She is symptomatic with her episodes. Her brother, was diagnosed with atrial fibrillation around the same time. He seems to be doing very well with diltiazem, thus we transitioned her previously from atenolol to diltiazem (stopped her atenolol 50 mg twice a day). She is only drinking a glass of wine nightly. I have answered several questions for her. Continue her anticoagulation.  Echocardiogram with mild left atrial enlargement. Overall reassuring. I will see her back in 4 months. If atrial fibrillation once again returns, I've asked her to keep her atenolol handy, take an extra tablet if her heart rate is increased. Next step would be to use flecainide.Please let us know. She is not interested at this time adding flecainide. 2. Essential hypertension-currently well controlled.  3. Chronic anticoagulation- Eliquis. Creatinine, hemoglobin previously normal. Recheck blood work in 6 months. Stopped  Plavix. 4. Four-month follow-up  Signed, Candee Furbish, MD [facility]  11/03/2014 10:25 AM

## 2014-11-07 ENCOUNTER — Other Ambulatory Visit: Payer: Self-pay | Admitting: Cardiology

## 2014-11-14 ENCOUNTER — Ambulatory Visit (HOSPITAL_COMMUNITY): Payer: Medicare Other

## 2014-11-17 ENCOUNTER — Ambulatory Visit (HOSPITAL_COMMUNITY)
Admission: RE | Admit: 2014-11-17 | Discharge: 2014-11-17 | Disposition: A | Discharge status: Home / Self Care | Payer: Medicare Other | Source: Ambulatory Visit | Attending: Family Medicine | Admitting: Family Medicine

## 2014-11-17 DIAGNOSIS — Z1231 Encounter for screening mammogram for malignant neoplasm of breast: Secondary | ICD-10-CM | POA: Insufficient documentation

## 2015-03-03 ENCOUNTER — Ambulatory Visit (INDEPENDENT_AMBULATORY_CARE_PROVIDER_SITE_OTHER): Payer: Self-pay | Admitting: Cardiology

## 2015-03-03 ENCOUNTER — Encounter: Payer: Self-pay | Admitting: Cardiology

## 2015-03-03 VITALS — BP 148/78 | HR 82 | Ht 62.0 in | Wt 135.4 lb

## 2015-03-03 DIAGNOSIS — I10 Essential (primary) hypertension: Secondary | ICD-10-CM | POA: Insufficient documentation

## 2015-03-03 DIAGNOSIS — I480 Paroxysmal atrial fibrillation: Secondary | ICD-10-CM

## 2015-03-03 DIAGNOSIS — Z79899 Other long term (current) drug therapy: Secondary | ICD-10-CM | POA: Insufficient documentation

## 2015-03-03 LAB — BASIC METABOLIC PANEL
BUN: 26 mg/dL — AB (ref 6–23)
CALCIUM: 9.4 mg/dL (ref 8.4–10.5)
CHLORIDE: 102 meq/L (ref 96–112)
CO2: 32 meq/L (ref 19–32)
Creatinine, Ser: 1.23 mg/dL — ABNORMAL HIGH (ref 0.40–1.20)
GFR: 45.46 mL/min — ABNORMAL LOW (ref >60.00)
GLUCOSE: 111 mg/dL — AB (ref 70–99)
Potassium: 3.8 mEq/L (ref 3.5–5.1)
Sodium: 138 mEq/L (ref 135–145)

## 2015-03-03 LAB — CBC
HEMATOCRIT: 39 % (ref 36.0–46.0)
Hemoglobin: 13 g/dL (ref 12.0–15.0)
MCHC: 33.4 g/dL (ref 30.0–36.0)
MCV: 94.9 fl (ref 78.0–100.0)
Platelets: 219 10*3/uL (ref 150.0–400.0)
RBC: 4.1 Mil/uL (ref 3.87–5.11)
RDW: 13 % (ref 11.5–15.5)
WBC: 7.9 10*3/uL (ref 4.0–10.5)

## 2015-03-03 NOTE — Progress Notes (Signed)
[facility]. [address] [place], [place]  [address] Phone: [phone] Fax:  [phone]  Date:  03/03/2015   ID:  Kaitlyn Hoover, DOB Oct 22, 1941, MRN [id number]  PCP:  Jonathon Bellows, MD   History of Present Illness: Kaitlyn Hoover is a 74 y.o. female here for atrial fibrillation with recent hospitalization, discharged on 04/19/14. She was initially started on Cardizem drip with spontaneous conversion to sinus rhythm. Lab work was normal. AFIB was 133. Symptomatic. She is a [employer].   She had approximate 5 episodes of atrial fibrillation since she was first diagnosed. She's been to the emergency room twice for this.  Her brother was diagnosed as well with atrial fibrillation and diltiazem seems to working well for him.  Usually has SVT in the past which can be terminated with Valsalva.   AFIB was not prompted by any fevers, recent surgeries, thyroid issues. She has one glass of wine per night.  After she converted, she felt well.  Had another episode prompting ER visit 08/15/14-extra dose of diltiazem, sent home.  When asked if she had a history of coronary artery disease, she denies. This was listed in her past medical history.  Since putting her on diltiazem 180 mg, she has been doing quite well. She did have one episode which lasted less than 12 hours. During this episode, her heart rate was not quite as fast as it had been in the past. No bleeding, no syncope.  03/03/15 - last Oct 26 2014 last episode. Good on Diltaizem.   Wt Readings from Last 3 Encounters:  03/03/15 135 lb 6.4 oz (61.417 kg)  11/03/14 127 lb (57.607 kg)  10/17/14 127 lb (57.607 kg)     Past Medical History  Diagnosis Date  . Hypertension   . PAT (paroxysmal atrial tachycardia)   . Atrial fibrillation with RVR   . Post-polio syndrome   . Palpitations     Past Surgical History  Procedure Laterality Date  . No surgical hx      Current Outpatient Prescriptions  Medication Sig Dispense  Refill  . acyclovir (ZOVIRAX) 400 MG tablet Take 400 mg by mouth daily.     . Ascorbic Acid (VITAMIN C) 1000 MG tablet Take 1,000 mg by mouth daily.    Marland Kitchen b complex vitamins tablet Take 1 tablet by mouth daily.    . calcium carbonate 1250 MG capsule Take 1,250 mg by mouth 2 (two) times daily with a meal.    . cholecalciferol (VITAMIN D) 1000 UNITS tablet Take 1,000 Units by mouth daily.    . Coenzyme Q10 (CO Q-10) 100 MG CAPS Take 100 mLs by mouth daily.    Marland Kitchen diltiazem (CARDIZEM CD) 180 MG 24 hr capsule Take 1 capsule (180 mg total) by mouth daily. 90 capsule 3  . ELIQUIS 5 MG TABS tablet TAKE 1 TABLET BY MOUTH 2 TIMES DAILY. 180 tablet 1  . fexofenadine (ALLEGRA) 180 MG tablet Take 180 mg by mouth daily.    Marland Kitchen levothyroxine (SYNTHROID, LEVOTHROID) 100 MCG tablet Take 100 mcg by mouth daily.    . Melatonin 5 MG CAPS Take 5 mg by mouth at bedtime.    . Omega-3 Fatty Acids (FISH OIL BURP-LESS) 1000 MG CAPS Take 3,000 Units by mouth 2 (two) times daily.    . pravastatin (PRAVACHOL) 20 MG tablet Take 20 mg by mouth daily.    . traZODone (DESYREL) 50 MG tablet     . ziprasidone (  GEODON) 20 MG capsule Take 20 mg by mouth daily.     No current facility-administered medications for this visit.    Allergies:    Allergies  Allergen Reactions  . Ciprocinonide [Fluocinolone] Rash  . Sulfa Antibiotics Rash    Childhood allergy    Social History:  The patient  reports that she has quit smoking. She does not have any smokeless tobacco history on file. She reports that she drinks alcohol.   Family History  Problem Relation Age of Onset  . Hypertension Mother   . Cancer Father     JAW BONE  . COPD Brother   . Atrial fibrillation Brother   Mother died in 38's from accident. Has HTN.   ROS:  Please see the history of present illness.      All other systems reviewed and negative.   PHYSICAL EXAM: VS:  BP 148/78 mmHg  Pulse 82  Ht 5\' 2"  (1.575 m)  Wt 135 lb 6.4 oz (61.417 kg)  BMI 24.76 kg/m2   SpO2 98% Well nourished, thin in no acute distress HEENT: normal, [place]/AT, EOMI, hearing aids, mild speech impairment Neck: no JVD, normal carotid upstroke, no bruit Cardiac:  normal S1, S2; RRR; no murmur Lungs:  clear to auscultation bilaterally, no wheezing, rhonchi or rales Abd: soft, nontender, no hepatomegaly, no bruits Ext: no edema, 2+ distal pulses Skin: warm and dry GU: deferred Neuro: no focal abnormalities noted, AAO x 3  EKG: 04/18/14 at 10:18 AM, sinus bradycardia rate 57, no other abnormalities.   ECHO: 04/18/14 - Left ventricle: The cavity size was normal. Wall thickness was normal. Systolic function was normal. The estimated ejection fraction was in the range of 60% to 65%. Wall motion was normal; there were no regional wall motion abnormalities. - Left atrium: The atrium was mildly dilated. - Atrial septum: No defect or patent foramen ovale was identified. - Pulmonary arteries: PA peak pressure: 32 mm Hg (S).  Labs: 04/17/14-TSH normal, creatinine 0.79. Prior medical records reviewed, ER visit reviewed.   ASSESSMENT AND PLAN:  1. Paroxysmal atrial fibrillation- She is doing very well. Last episode December 2015. Diltiazem. CHADS-VASc at least 3 (Female, AGE, HTN).  Eliquis. Stopped Plavix previously. She is symptomatic with her episodes. Her brother, was diagnosed with atrial fibrillation around the same time. He seems to be doing very well with diltiazem, thus we transitioned her previously from atenolol to diltiazem (stopped her atenolol 50 mg twice a day). She is only drinking a glass of wine nightly. I have answered several questions for her. Continue her anticoagulation.  Echocardiogram with mild left atrial enlargement. Overall reassuring. I've asked her to keep her atenolol handy, take an extra tablet if her heart rate is increased. Next step would be to use flecainide.Please let us know. She is not interested at this time adding flecainide. 2. Essential  hypertension-currently well controlled.  3. Chronic anticoagulation- Eliquis. Creatinine, hemoglobin previously normal. Recheck blood work 4. 29-month follow-up  Signed, Candee Furbish, MD [facility]  03/03/2015 1:53 PM

## 2015-03-03 NOTE — Patient Instructions (Signed)
The current medical regimen is effective;  continue present plan and medications.  Please have blood work today (BMP,CBC)  Follow up in 6 months with Dr. Marlou Porch.  You will receive a letter in the mail 2 months before you are due.  Please call us when you receive this letter to schedule your follow up appointment.  Thank you for choosing [facility]!!

## 2015-05-15 ENCOUNTER — Other Ambulatory Visit: Payer: Self-pay

## 2015-05-17 ENCOUNTER — Other Ambulatory Visit: Payer: Self-pay | Admitting: Cardiology

## 2015-05-30 ENCOUNTER — Telehealth: Payer: Self-pay | Admitting: Cardiology

## 2015-05-30 NOTE — Telephone Encounter (Signed)
Follow up ° ° ° ° °Returning Kaitlyn Hoover's call °

## 2015-05-30 NOTE — Telephone Encounter (Signed)
Attempted to call pt however connection had a lot of static.  I will attempt to call her back later.

## 2015-05-30 NOTE — Telephone Encounter (Signed)
Left message for pt to call back  °

## 2015-05-30 NOTE — Telephone Encounter (Signed)
New message      Pt c/o medication issue:  1. Name of Medication: eliquis 2. How are you currently taking this medication (dosage and times per day)? 5mg  daily  3. Are you having a reaction (difficulty breathing--STAT)?  4. What is your medication issue?  Pt cannot afford to take eliquis twice a day---she has been taking it once a day since mon the 11th.  Is this ok?

## 2015-05-30 NOTE — Telephone Encounter (Signed)
Spoke with pt who states understanding that she must take Eliquis twice a day in order for it to protect her from stroke.  Offered for her to change to Coumadin however pt states she just received a 90 day supply and she will take it twice a day.  She will let us know if she can not afford to get it the next time and will consider going on Coumadin then.

## 2015-06-26 ENCOUNTER — Other Ambulatory Visit: Payer: Self-pay | Admitting: Gastroenterology

## 2015-07-27 ENCOUNTER — Telehealth: Payer: Self-pay | Admitting: Cardiology

## 2015-07-27 NOTE — Telephone Encounter (Signed)
SPOKE WITH  PT  RE MESSAGE  LOCATED  AN ASSISTANCE  APP  TO HAVE PT  PICK AND  FILL OUT  TO SEE   IF  QUALIFIES   ALSO   LEFT SAMPLES  OF ELIQUIS AT FRONT  DESK FOR  PICK AS  WELL PER PT  WILL PICK UP  TOMORROW ./CY

## 2015-07-27 NOTE — Telephone Encounter (Signed)
New message    Pt has questions about her eliquis Pt is in doughnut hole and cannot afford; pt wanting to know what her options would be to get medication  Please call to discuss

## 2015-08-03 ENCOUNTER — Other Ambulatory Visit: Payer: Self-pay

## 2015-08-03 MED ORDER — APIXABAN 5 MG PO TABS: 5.0000 mg | ORAL_TABLET | Freq: Two times a day (BID) | ORAL | Status: DC

## 2015-08-16 ENCOUNTER — Telehealth: Payer: Self-pay | Admitting: Cardiology

## 2015-08-16 NOTE — Telephone Encounter (Signed)
New message     Pt brought a form to [place] about 1 month ago needing [place] to complete it for assistance with her eliquis.  She has 3wks work of rx but cannot afford to get a presc.  Did you get the form?  Please call pt and give update.

## 2015-08-18 NOTE — Telephone Encounter (Signed)
Spoke with patient and let her know we faxed PA form last week. It takes approx 2-3 weeks for a response. Also told her that sometimes patients find out before we do. She voiced understanding.

## 2015-09-04 ENCOUNTER — Ambulatory Visit (INDEPENDENT_AMBULATORY_CARE_PROVIDER_SITE_OTHER): Payer: Medicare Other | Admitting: Cardiology

## 2015-09-04 ENCOUNTER — Encounter: Payer: Self-pay | Admitting: Cardiology

## 2015-09-04 VITALS — BP 150/72 | HR 62 | Ht 62.0 in | Wt 134.0 lb

## 2015-09-04 DIAGNOSIS — I480 Paroxysmal atrial fibrillation: Secondary | ICD-10-CM | POA: Diagnosis not present

## 2015-09-04 DIAGNOSIS — Z79899 Other long term (current) drug therapy: Secondary | ICD-10-CM

## 2015-09-04 DIAGNOSIS — I10 Essential (primary) hypertension: Secondary | ICD-10-CM | POA: Diagnosis not present

## 2015-09-04 DIAGNOSIS — I6523 Occlusion and stenosis of bilateral carotid arteries: Secondary | ICD-10-CM

## 2015-09-04 NOTE — Patient Instructions (Signed)
Medication Instructions:  The current medical regimen is effective;  continue present plan and medications.  Testing/Procedures: Your physician has requested that you have a carotid duplex. This test is an ultrasound of the carotid arteries in your neck. It looks at blood flow through these arteries that supply the brain with blood. Allow one hour for this exam. There are no restrictions or special instructions.  Follow-Up: Follow up in 05/2016 with Dr. Marlou Porch.  You will receive a letter in the mail 2 months before you are due.  Please call us when you receive this letter to schedule your follow up appointment.  Thank you for choosing [facility]!!

## 2015-09-04 NOTE — Progress Notes (Signed)
[facility]. [address] [place], [place]  [address] Phone: [phone] Fax:  [phone]  Date:  09/04/2015   ID:  Kaitlyn Hoover, DOB [birth year], MRN [id number]  PCP:  Jonathon Bellows, MD   History of Present Illness: Kaitlyn Hoover is a 74 y.o. female "Dionne Milo for atrial fibrillation with recent hospitalization, discharged on 04/19/14. She was initially started on Cardizem drip with spontaneous conversion to sinus rhythm. Lab work was normal. AFIB was 133. Symptomatic. She is a Marine scientist.   She had approximate 5 episodes of atrial fibrillation since she was first diagnosed. She's been to the emergency room twice for this.  Her brother was diagnosed as well with atrial fibrillation and diltiazem seems to working well for him.  Usually has SVT in the past which can be terminated with Valsalva.   AFIB was not prompted by any fevers, recent surgeries, thyroid issues. She has one glass of wine per night.  After she converted, she felt well.  Had another episode prompting ER visit 08/15/14-extra dose of diltiazem, sent home.  When asked if she had a history of coronary artery disease, she denies. This was listed in her past medical history.  Since putting her on diltiazem 180 mg, she has been doing quite well. She did have one episode which lasted less than 12 hours. During this episode, her heart rate was not quite as fast as it had been in the past. No bleeding, no syncope.  03/03/15 - last Oct 26 2014 last episode. Good on Diltaizem.   09/04/15 - doing really well. No episodes. Occasionally will feel SVT, Valsalva. No chest pain. She reminded me that one episode in the hospital was not paid for by her insurance. Used to work for home health.   Wt Readings from Last 3 Encounters:  09/04/15 134 lb (60.782 kg)  03/03/15 135 lb 6.4 oz (61.417 kg)  11/03/14 127 lb (57.607 kg)     Past Medical History  Diagnosis Date  . Hypertension   . PAT (paroxysmal atrial tachycardia)  ([place])   . Atrial fibrillation with RVR ([place])   . Post-polio syndrome   . Palpitations     Past Surgical History  Procedure Laterality Date  . No surgical hx      Current Outpatient Prescriptions  Medication Sig Dispense Refill  . acyclovir (ZOVIRAX) 400 MG tablet Take 400 mg by mouth daily.     Marland Kitchen apixaban (ELIQUIS) 5 MG TABS tablet Take 1 tablet (5 mg total) by mouth 2 (two) times daily. 180 tablet 3  . Ascorbic Acid (VITAMIN C) 1000 MG tablet Take 1,000 mg by mouth daily.    Marland Kitchen b complex vitamins tablet Take 1 tablet by mouth daily.    . Calcium Carbonate-Vitamin D (CALCIUM-CARB 600 + D PO) Take 2 tablets by mouth at bedtime.    . cholecalciferol (VITAMIN D) 1000 UNITS tablet Take 1,000 Units by mouth daily.    . Coenzyme Q10 (CO Q-10) 100 MG CAPS Take 100 mLs by mouth daily.    Marland Kitchen diltiazem (CARDIZEM CD) 180 MG 24 hr capsule Take 1 capsule (180 mg total) by mouth daily. 90 capsule 3  . fexofenadine (ALLEGRA) 180 MG tablet Take 180 mg by mouth daily.    Marland Kitchen levothyroxine (SYNTHROID, LEVOTHROID) 100 MCG tablet Take 100 mcg by mouth daily.    . Magnesium 400 MG CAPS Take 1 tablet by mouth daily.    . Melatonin 5 MG CAPS Take  5 mg by mouth at bedtime.    . Multiple Vitamin (MULTIVITAMIN) capsule Take 1 capsule by mouth 2 (two) times daily.    . Omega-3 Fatty Acids (FISH OIL BURP-LESS) 1000 MG CAPS Take 3,000 Units by mouth 2 (two) times daily.    . temazepam (RESTORIL) 7.5 MG capsule Take 7.5 mg by mouth at bedtime as needed for sleep.    . ziprasidone (GEODON) 20 MG capsule Take 20 mg by mouth daily.     No current facility-administered medications for this visit.    Allergies:    Allergies  Allergen Reactions  . Ciprocinonide [Fluocinolone] Rash  . Sulfa Antibiotics Rash    Childhood allergy    Social History:  The patient  reports that she has quit smoking. She does not have any smokeless tobacco history on file. She reports that she drinks alcohol.   Family History  Problem  Relation Age of Onset  . Hypertension Mother   . Cancer Father     JAW BONE  . COPD Brother   . Atrial fibrillation Brother   Mother died in 70's from accident. Has HTN.   ROS:  Please see the history of present illness.      All other systems reviewed and negative.   PHYSICAL EXAM: VS:  BP 150/72 mmHg  Pulse 62  Ht 5\' 2"  (1.575 m)  Wt 134 lb (60.782 kg)  BMI 24.50 kg/m2 Well nourished, thin in no acute distress HEENT: normal, [place]/AT, EOMI, hearing aids, mild speech impairment Neck: no JVD, normal carotid upstroke, no bruit Cardiac:  normal S1, S2; RRR; no murmur Lungs:  clear to auscultation bilaterally, no wheezing, rhonchi or rales Abd: soft, nontender, no hepatomegaly, no bruits Ext: no edema, 2+ distal pulses Skin: warm and dry GU: deferred Neuro: no focal abnormalities noted, AAO x 3  EKG: 04/18/14 at 10:18 AM, sinus bradycardia rate 57, no other abnormalities.   ECHO: 04/18/14 - Left ventricle: The cavity size was normal. Wall thickness was normal. Systolic function was normal. The estimated ejection fraction was in the range of 60% to 65%. Wall motion was normal; there were no regional wall motion abnormalities. - Left atrium: The atrium was mildly dilated. - Atrial septum: No defect or patent foramen ovale was identified. - Pulmonary arteries: PA peak pressure: 32 mm Hg (S).  Labs: 04/17/14-TSH normal, creatinine 0.79. Prior medical records reviewed, ER visit reviewed.   ASSESSMENT AND PLAN:  1. Paroxysmal atrial fibrillation- She is doing very well. Last episode December 2015. Diltiazem. CHADS-VASc at least 3 (Female, AGE, HTN).  Eliquis. Stopped Plavix previously. She is symptomatic with her episodes. Her brother, was diagnosed with atrial fibrillation around the same time. He seems to be doing very well with diltiazem, thus we transitioned her previously from atenolol to diltiazem (stopped her atenolol 50 mg twice a day). She is only drinking a glass of wine nightly. I  have answered several questions for her. Continue her anticoagulation.  Echocardiogram with mild left atrial enlargement. Overall reassuring. I've asked her to keep her atenolol handy, take an extra tablet if her heart rate is increased. Next step would be to use flecainide.Please let us know. She is not interested at this time adding flecainide. Overall she's doing very well. She likes the diltiazem. 2. Essential hypertension-currently well controlled.  3. Chronic anticoagulation- Eliquis. Creatinine, hemoglobin previously normal. Recheck blood work in January with Dr. Justin Mend 4. Carotid artery stenosis notes that she has had test before which showed moderate amount of  right carotid artery plaque. We will check ultrasound. Carotid. We are going to try to alternate visits on a yearly basis with her primary doctor. 5. 80-month follow-up  Signed, Candee Furbish, MD [facility]  09/04/2015 2:40 PM

## 2015-09-12 ENCOUNTER — Ambulatory Visit (HOSPITAL_COMMUNITY)
Admission: RE | Admit: 2015-09-12 | Discharge: 2015-09-12 | Disposition: A | Discharge status: Home / Self Care | Payer: Medicare Other | Source: Ambulatory Visit | Attending: Cardiology | Admitting: Cardiology

## 2015-09-12 DIAGNOSIS — I6523 Occlusion and stenosis of bilateral carotid arteries: Secondary | ICD-10-CM

## 2015-09-12 DIAGNOSIS — I10 Essential (primary) hypertension: Secondary | ICD-10-CM | POA: Diagnosis not present

## 2015-09-12 DIAGNOSIS — R002 Palpitations: Secondary | ICD-10-CM | POA: Insufficient documentation

## 2015-10-30 ENCOUNTER — Other Ambulatory Visit: Payer: Self-pay

## 2015-10-30 DIAGNOSIS — Z1231 Encounter for screening mammogram for malignant neoplasm of breast: Secondary | ICD-10-CM

## 2015-11-16 ENCOUNTER — Other Ambulatory Visit: Payer: Self-pay

## 2015-11-16 DIAGNOSIS — I10 Essential (primary) hypertension: Secondary | ICD-10-CM

## 2015-11-16 MED ORDER — DILTIAZEM HCL ER COATED BEADS 180 MG PO CP24: 180.0000 mg | ORAL_CAPSULE | Freq: Every day | ORAL | Status: DC

## 2015-11-16 NOTE — Telephone Encounter (Signed)
Kaitlyn Pain, MD at 09/04/2015 1:43 PM  diltiazem (CARDIZEM CD) 180 MG 24 hr capsuleTake 1 capsule (180 mg total) by mouth daily ASSESSMENT AND PLAN:  1. Paroxysmal atrial fibrillation- She is doing very well. Last episode December 2015. Diltiazem. CHADS-VASc at least 3 (Female, AGE, HTN). Eliquis. Stopped Plavix previously. She is symptomatic with her episodes. Her brother, was diagnosed with atrial fibrillation around the same time. He seems to be doing very well with diltiazem, thus we transitioned her previously from atenolol to diltiazem (stopped her atenolol 50 mg twice a day). She is only drinking a glass of wine nightly. I have answered several questions for her. Continue her anticoagulation. Echocardiogram with mild left atrial enlargement. Overall reassuring. I've asked her to keep her atenolol handy, take an extra tablet if her heart rate is increased. Next step would be to use flecainide.Please let us know. She is not interested at this time adding flecainide. Overall she's doing very well. She likes the diltiazem.

## 2015-11-29 ENCOUNTER — Ambulatory Visit: Payer: Medicare Other

## 2015-12-01 ENCOUNTER — Ambulatory Visit
Admission: RE | Admit: 2015-12-01 | Discharge: 2015-12-01 | Disposition: A | Discharge status: Home / Self Care | Payer: PPO | Source: Ambulatory Visit

## 2015-12-01 DIAGNOSIS — Z1231 Encounter for screening mammogram for malignant neoplasm of breast: Secondary | ICD-10-CM

## 2015-12-26 ENCOUNTER — Telehealth: Payer: Self-pay | Admitting: Cardiology

## 2015-12-26 NOTE — Telephone Encounter (Signed)
Pt wants to know if ok to take a baby asa with Eliquis, pls [phone]

## 2015-12-26 NOTE — Telephone Encounter (Signed)
Patient would like to know if she may take ASA for inflammation with Eliquis.

## 2015-12-27 NOTE — Telephone Encounter (Signed)
Patient notified. Patient voiced understanding.

## 2015-12-27 NOTE — Telephone Encounter (Signed)
I would avoid ASA  Candee Furbish, MD

## 2016-01-08 DIAGNOSIS — A809 Acute poliomyelitis, unspecified: Secondary | ICD-10-CM | POA: Diagnosis not present

## 2016-01-08 DIAGNOSIS — E785 Hyperlipidemia, unspecified: Secondary | ICD-10-CM | POA: Diagnosis not present

## 2016-01-08 DIAGNOSIS — Z0000 Encounter for general adult medical examination without abnormal findings: Secondary | ICD-10-CM | POA: Diagnosis not present

## 2016-01-08 DIAGNOSIS — E039 Hypothyroidism, unspecified: Secondary | ICD-10-CM | POA: Diagnosis not present

## 2016-01-08 DIAGNOSIS — I10 Essential (primary) hypertension: Secondary | ICD-10-CM | POA: Diagnosis not present

## 2016-01-08 DIAGNOSIS — I4891 Unspecified atrial fibrillation: Secondary | ICD-10-CM | POA: Diagnosis not present

## 2016-01-12 ENCOUNTER — Encounter: Payer: Self-pay | Admitting: Neurology

## 2016-01-12 ENCOUNTER — Ambulatory Visit (INDEPENDENT_AMBULATORY_CARE_PROVIDER_SITE_OTHER): Payer: PPO | Admitting: Neurology

## 2016-01-12 ENCOUNTER — Encounter: Payer: Self-pay | Admitting: Cardiology

## 2016-01-12 VITALS — BP 128/72 | HR 68 | Resp 16 | Ht 62.0 in | Wt 138.0 lb

## 2016-01-12 DIAGNOSIS — M6250 Muscle wasting and atrophy, not elsewhere classified, unspecified site: Secondary | ICD-10-CM | POA: Diagnosis not present

## 2016-01-12 DIAGNOSIS — G14 Postpolio syndrome: Secondary | ICD-10-CM

## 2016-01-12 DIAGNOSIS — R531 Weakness: Secondary | ICD-10-CM

## 2016-01-12 DIAGNOSIS — R3915 Urgency of urination: Secondary | ICD-10-CM | POA: Insufficient documentation

## 2016-01-12 DIAGNOSIS — M6281 Muscle weakness (generalized): Secondary | ICD-10-CM | POA: Diagnosis not present

## 2016-01-12 NOTE — Progress Notes (Signed)
[facility]  PATIENT: Kaitlyn Hoover DOB: 1941-11-01  REFERRING DOCTOR OR PCP:  [name], MD SOURCE: patient, records form Dr. Justin Mend, records in EMR  _________________________________   HISTORICAL  CHIEF COMPLAINT:  Chief Complaint  Patient presents with  . Post Polio Syndrome    Sts. she had Polio in 1946.  Around 1995 she began having weakness in bilat legs.  Weakness has slowly worsened, and is worse in the mornings.  She is finding it more difficult to exercise--so [facility]    HISTORY OF PRESENT ILLNESS:  Kaitlyn Hoover is a 75 year old woman who has had progressive weakness in her legs over the past 20 years. She was diagnosed with post polio syndrome in 1998.Marland Kitchen   She had polio when she was 4 and it affected her legs, much more so on the right. Over the next year or 2 she recovered and was able to be very active as a child. Starting around age 75, she began to notice that she was getting weaker again in the legs. Initially the right leg was more involved but both legs seem to be involved now. She does report some urinary incontinence at times though usually just has frequency. She is on oxybutynin with benefit.   She denies neck pain or significant LBP She does get some mild discomfort in her legs at times.  She has noted mild swallowing difficulties and sees GI.   She was placed onm . She has not had any weakness in her arms. She snores but has not been told that she has gasping or pauses in her breathing. She denies significant fatigue or excessive daytime sleepiness.  About 5 years ago, she had a patch of dysesthetic numbness near the left knee.  This improved over time.    In 1998, she went to a post polio center in [place] and had a nerve conduction/EMG test performed confirming the history of polio and she was told that it was consistent with post polio syndrome.  Those results are not available at this  time.  Sometimes when she gets out of bed in the morning her legs freeze.    She has some discomfort in the mornings  REVIEW OF SYSTEMS: Constitutional: No fevers, chills, sweats, or change in appetite Eyes: No visual changes, double vision, eye pain Ear, nose and throat: No hearing loss, ear pain, nasal congestion, sore throat Cardiovascular: No chest pain, palpitations Respiratory: No shortness of breath at rest or with exertion.   No wheezes GastrointestinaI: No nausea, vomiting, diarrhea, abdominal pain, fecal incontinence Genitourinary: No dysuria, urinary retention or frequency.  No nocturia. Musculoskeletal: No neck pain, back pain Integumentary: No rash, pruritus, skin lesions Neurological: as above Psychiatric: No depression at this time.  No anxiety Endocrine: No palpitations, diaphoresis, change in appetite, change in weigh or increased thirst Hematologic/Lymphatic: No anemia, purpura, petechiae. Allergic/Immunologic: No itchy/runny eyes, nasal congestion, recent allergic reactions, rashes  ALLERGIES: Allergies  Allergen Reactions  . Ciprocinonide [Fluocinolone] Rash  . Sulfa Antibiotics Rash    Childhood allergy    HOME MEDICATIONS:  Current outpatient prescriptions:  .  acyclovir (ZOVIRAX) 400 MG tablet, Take 400 mg by mouth daily. , Disp: , Rfl:  .  apixaban (ELIQUIS) 5 MG TABS tablet, Take 1 tablet (5 mg total) by mouth 2 (two) times daily., Disp: 180 tablet, Rfl: 3 .  Ascorbic Acid (VITAMIN C) 1000 MG tablet, Take 1,000 mg by mouth daily., Disp: ,  Rfl:  .  b complex vitamins tablet, Take 1 tablet by mouth daily., Disp: , Rfl:  .  Calcium Carbonate-Vitamin D (CALCIUM-CARB 600 + D PO), Take 2 tablets by mouth at bedtime., Disp: , Rfl:  .  cholecalciferol (VITAMIN D) 1000 UNITS tablet, Take 1,000 Units by mouth daily., Disp: , Rfl:  .  Coenzyme Q10 (CO Q-10) 100 MG CAPS, Take 100 mLs by mouth daily., Disp: , Rfl:  .  diltiazem (CARDIZEM CD) 180 MG 24 hr capsule,  Take 1 capsule (180 mg total) by mouth daily., Disp: 90 capsule, Rfl: 2 .  fexofenadine (ALLEGRA) 180 MG tablet, Take 180 mg by mouth daily., Disp: , Rfl:  .  levothyroxine (SYNTHROID, LEVOTHROID) 100 MCG tablet, Take 100 mcg by mouth daily., Disp: , Rfl:  .  Magnesium 400 MG CAPS, Take 1 tablet by mouth daily., Disp: , Rfl:  .  Melatonin 5 MG CAPS, Take 5 mg by mouth at bedtime., Disp: , Rfl:  .  Multiple Vitamin (MULTIVITAMIN) capsule, Take 1 capsule by mouth 2 (two) times daily., Disp: , Rfl:  .  Omega-3 Fatty Acids (FISH OIL BURP-LESS) 1000 MG CAPS, Take 3,000 Units by mouth 2 (two) times daily., Disp: , Rfl:  .  temazepam (RESTORIL) 7.5 MG capsule, Take 7.5 mg by mouth at bedtime as needed for sleep., Disp: , Rfl:  .  ziprasidone (GEODON) 20 MG capsule, Take 20 mg by mouth daily., Disp: , Rfl:   PAST MEDICAL HISTORY: Past Medical History  Diagnosis Date  . Hypertension   . PAT (paroxysmal atrial tachycardia) ([place])   . Atrial fibrillation with RVR ([place])   . Post-polio syndrome   . Palpitations     PAST SURGICAL HISTORY: Past Surgical History  Procedure Laterality Date  . No surgical hx      FAMILY HISTORY: Family History  Problem Relation Age of Onset  . Hypertension Mother   . Cancer Father     JAW BONE  . COPD Brother   . Atrial fibrillation Brother     SOCIAL HISTORY:  Social History   Social History  . Marital Status: Divorced    Spouse Name: N/A  . Number of Children: N/A  . Years of Education: N/A   Occupational History  . Not on file.   Social History Main Topics  . Smoking status: Former Research scientist (life sciences)  . Smokeless tobacco: Not on file  . Alcohol Use: Yes  . Drug Use: Not on file  . Sexual Activity: Not on file   Other Topics Concern  . Not on file   Social History Narrative     PHYSICAL EXAM  Filed Vitals:   01/12/16 1016  BP: 128/72  Pulse: 68  Resp: 16  Height: 5\' 2"  (1.575 m)  Weight: 138 lb (62.596 kg)    Body mass index is 25.23  kg/(m^2).   General: The patient is well-developed and well-nourished and in no acute distress  Neck: The neck is supple, no carotid bruits are noted.  The neck is nontender.  Cardiovascular: The heart has a regular rate and rhythm with a normal S1 and S2. There were no murmurs, gallops or rubs. Lungs are clear to auscultation.  Skin: Extremities are without significant edema.  Extremities:   Right leg 1/2 inch shorter than left  Musculoskeletal:  Back is nontender  Neurologic Exam  Mental status: The patient is alert and oriented x 3 at the time of the examination. The patient has apparent normal recent and remote  memory, with an apparently normal attention span and concentration ability.   Speech is normal.  Cranial nerves: Extraocular movements are full.  There is good facial sensation to soft touch bilaterally.Facial strength is normal.  Trapezius and sternocleidomastoid strength is normal. No dysarthria is noted.  The tongue is midline, and the patient has symmetric elevation of the soft palate. No obvious hearing deficits are noted.  Motor:  Muscle bulk is reduced in right legl.   Tone is normal. Strength is  5 / 5 in arms and left leg.   Strength is 4+-5/5 in iliopsoas and quads, 4/5 hamstrings, 3/5 tib ant, and 1/5 EHL , 2/5 EDB..   Sensory: Sensory testing is intact to pinprick, soft touch and vibration sensation in all 4 extremities.  Coordination: Cerebellar testing reveals good finger-nose-finger and heel-to-shin bilaterally.  Gait and station: Station is normal.   Gait has right foot drop.l. Tandem gait is wide. Romberg is negative.   Reflexes: Deep tendon reflexes are reduced in right leg compared to left.   Plantar responses are flexor.    DIAGNOSTIC DATA (LABS, IMAGING, TESTING) - I reviewed patient records, labs, notes, testing and imaging myself where available.  Lab Results  Component Value Date   WBC 7.9 03/03/2015   HGB 13.0 03/03/2015   HCT 39.0  03/03/2015   MCV 94.9 03/03/2015   PLT 219.0 03/03/2015      Component Value Date/Time   NA 138 03/03/2015 1411   K 3.8 03/03/2015 1411   CL 102 03/03/2015 1411   CO2 32 03/03/2015 1411   GLUCOSE 111* 03/03/2015 1411   BUN 26* 03/03/2015 1411   CREATININE 1.23* 03/03/2015 1411   CALCIUM 9.4 03/03/2015 1411   PROT 7.4 04/17/2014 2352   ALBUMIN 3.6 04/17/2014 2352   AST 29 04/17/2014 2352   ALT 14 04/17/2014 2352   ALKPHOS 81 04/17/2014 2352   BILITOT <0.2* 04/17/2014 2352   GFRNONAA 85* 08/15/2014 1851   GFRAA >90 08/15/2014 1851   No results found for: CHOL, HDL, LDLCALC, LDLDIRECT, TRIG, CHOLHDL No results found for: HGBA1C No results found for: VITAMINB12 Lab Results  Component Value Date   TSH 0.748 04/18/2014       ASSESSMENT AND PLAN  Post poliomyelitis syndrome - Plan: MR Cervical Spine Wo Contrast  Progressive focal motor weakness - Plan: MR Cervical Spine Wo Contrast, MR Lumbar Spine Wo Contrast, CK  Muscle atrophy - Plan: MR Lumbar Spine Wo Contrast  Urinary urgency - Plan: MR Cervical Spine Wo Contrast   In summary, Kaitlyn Hoover is a 75 year old woman with postpolio syndrome who has had progressive weakness over the last 20 years, predominantly in the right leg that was affected when she was younger. However, more recently she is noting some weakness in the left leg as well. Additionally she has urinary urgency. Because of symptoms on the left that have not been involved with her initial polio and because of the urinary urgency, we need to rule out other processes that may be superimposed on her postpolio syndrome.    We need to check an MRI of the cervical spine to make sure that a myelopathy is not attributing to her progressive weakness. Additionally I will check an MRI of the lumbar spine to make sure that the worsening symptoms are not due to polyradiculopathy or severe spinal stenosis.    We discussed that there is no direct treatment for postpolio  syndrome. Vigorous exercises.recommended. She is advised to wear her right foot/ankle splint more.  She will return to see me in 2-3 months or sooner if there are new or worsening neurologic symptoms.  Jaysa Kise A. Felecia Shelling, MD, PhD [id number], [id number] AM Certified in Neurology, Clinical Neurophysiology, Sleep Medicine, Pain Medicine and Neuroimaging  [facility] [address] [place], [place] [address] [phone]

## 2016-01-13 LAB — CK: Total CK: 80 U/L (ref 24–173)

## 2016-01-18 ENCOUNTER — Telehealth: Payer: Self-pay | Admitting: Cardiology

## 2016-01-18 DIAGNOSIS — I779 Disorder of arteries and arterioles, unspecified: Secondary | ICD-10-CM

## 2016-01-18 DIAGNOSIS — M6250 Muscle wasting and atrophy, not elsewhere classified, unspecified site: Secondary | ICD-10-CM

## 2016-01-18 DIAGNOSIS — I739 Peripheral vascular disease, unspecified: Secondary | ICD-10-CM

## 2016-01-18 MED ORDER — ATORVASTATIN CALCIUM 40 MG PO TABS: 40.0000 mg | ORAL_TABLET | Freq: Every day | ORAL | Status: DC

## 2016-01-18 NOTE — Telephone Encounter (Signed)
Pt is aware  Of atorvastatin 40 mg was send to pt's pharmacy and Appointment date for labs.

## 2016-01-18 NOTE — Telephone Encounter (Signed)
Returning your call. °

## 2016-01-18 NOTE — Telephone Encounter (Signed)
Pt said she received a call yesterday and was told to call back for triage

## 2016-01-18 NOTE — Telephone Encounter (Signed)
Pt return call . She states that after she had the carotid duplex on 09/12/15, Dr skain's nurse called her the results she states that MD wants for her to start Atorvastatin due to her Carotic disease. Pt said that she has Post Polio symdrome , she always has leg pain and weakness. the nurse was going to call back to see if was okay with Dr. Marlou Porch for her to start this medication, but the nurse did not call her back. On 09/12/15 the carotid Duples results MD's not states:  "Please have her come in and check lipid panel and ALT if not recently done by Dr. Justin Mend (PCP) and then start atorvastatin 40mg  PO QD given her carotid dz. 6 weeks after starting, have her repeat lipid panel and ALT and have her come in for office visit to ensure medication is working well".  Pt states she is willing to try this medication if prescribed.  Lipids and BMET was done on 01/08/16 Atorvastatin 40 mg one tablet by mouth once daily  sent to [facility] pt's pharmacy. Pt is aware to have repeat labs ( Lipid and ALT)  6 weeks after starting medication, on 03/01/16.  Left pt a message to call back to let her know about appointment and prescription.

## 2016-01-18 NOTE — Telephone Encounter (Signed)
Left pt a message to call back. Pt has Lipids and BMET results back . Dr Marlou Porch would like to know if pt is taking Atorvastatin as prescribed before.

## 2016-01-24 ENCOUNTER — Ambulatory Visit
Admission: RE | Admit: 2016-01-24 | Discharge: 2016-01-24 | Disposition: A | Discharge status: Home / Self Care | Payer: PPO | Source: Ambulatory Visit | Attending: Neurology | Admitting: Neurology

## 2016-01-24 DIAGNOSIS — G14 Postpolio syndrome: Secondary | ICD-10-CM

## 2016-01-24 DIAGNOSIS — M6250 Muscle wasting and atrophy, not elsewhere classified, unspecified site: Secondary | ICD-10-CM

## 2016-01-24 DIAGNOSIS — R3915 Urgency of urination: Secondary | ICD-10-CM

## 2016-01-24 DIAGNOSIS — M6281 Muscle weakness (generalized): Secondary | ICD-10-CM | POA: Diagnosis not present

## 2016-01-24 DIAGNOSIS — R531 Weakness: Secondary | ICD-10-CM

## 2016-01-26 ENCOUNTER — Telehealth: Payer: Self-pay | Admitting: Neurology

## 2016-01-26 DIAGNOSIS — M48061 Spinal stenosis, lumbar region without neurogenic claudication: Secondary | ICD-10-CM

## 2016-01-26 DIAGNOSIS — G14 Postpolio syndrome: Secondary | ICD-10-CM

## 2016-01-26 DIAGNOSIS — R269 Unspecified abnormalities of gait and mobility: Secondary | ICD-10-CM | POA: Insufficient documentation

## 2016-01-26 NOTE — Telephone Encounter (Signed)
I spoke to Kaitlyn Hoover to let her know the results of the MRI.   The MRI of the cervical spine shows degenerative changes that could easily explain neck and arm pain but would not be expected to affect her walking   The MRI of the lumbar spine shows severe spinal stenosis at L4-L5 and mild to moderate spinal stenosis at L5-S1.   I think that the severe spinal stenosis could be affecting her walking. She does note that she feels weaker and gets some leg pain if she walks longer distances.   I will set up a neurosurgical appointment for her.   It is difficult to know the relative role that the spinal stenosis and the postpolio are playing in her issues.

## 2016-02-08 ENCOUNTER — Telehealth: Payer: Self-pay | Admitting: Cardiology

## 2016-02-08 NOTE — Telephone Encounter (Signed)
Pt reporting yesterday while walking her dog she became very SOB and dizzy after attempting to walk up an incline.  She sat on the curb and had someone drive her home shortly after.  Her HR was 102.  She took Atenolol and after 1 hr it was unchanged.  She took Diltiazem and her HR decreased to 64 but was still irregular.  Today she has felt weak but her rhythm is regular and she is feeling better.  BP today 122/64  HR 64.  Reassured pt and advised I will review with Dr Marlou Porch.  She is aware to call back if further concerns.

## 2016-02-08 NOTE — Telephone Encounter (Signed)
Patient c/o Palpitations:  High priority if patient c/o lightheadedness and shortness of breath.  1. How long have you been having palpitations? Since yesterday 3/22  2. Are you currently experiencing lightheadedness and shortness of breath?no  3. Have you checked your BP and heart rate? (document readings) BP stated is was normal- HR 64  4. Are you experiencing any other symptoms? no

## 2016-02-19 DIAGNOSIS — M4316 Spondylolisthesis, lumbar region: Secondary | ICD-10-CM | POA: Diagnosis not present

## 2016-02-19 DIAGNOSIS — I10 Essential (primary) hypertension: Secondary | ICD-10-CM | POA: Diagnosis not present

## 2016-02-19 DIAGNOSIS — M4726 Other spondylosis with radiculopathy, lumbar region: Secondary | ICD-10-CM | POA: Diagnosis not present

## 2016-02-19 DIAGNOSIS — M4806 Spinal stenosis, lumbar region: Secondary | ICD-10-CM | POA: Diagnosis not present

## 2016-03-01 ENCOUNTER — Other Ambulatory Visit (INDEPENDENT_AMBULATORY_CARE_PROVIDER_SITE_OTHER): Payer: PPO | Admitting: *Deleted

## 2016-03-01 DIAGNOSIS — Z79899 Other long term (current) drug therapy: Secondary | ICD-10-CM | POA: Diagnosis not present

## 2016-03-01 DIAGNOSIS — I739 Peripheral vascular disease, unspecified: Secondary | ICD-10-CM

## 2016-03-01 DIAGNOSIS — I779 Disorder of arteries and arterioles, unspecified: Secondary | ICD-10-CM

## 2016-03-01 DIAGNOSIS — M6250 Muscle wasting and atrophy, not elsewhere classified, unspecified site: Secondary | ICD-10-CM | POA: Diagnosis not present

## 2016-03-01 LAB — LIPID PANEL
CHOL/HDL RATIO: 2.7 ratio (ref <=5.0)
Cholesterol: 117 mg/dL — ABNORMAL LOW (ref 125–200)
HDL: 44 mg/dL — AB (ref >=46)
LDL CALC: 54 mg/dL (ref <130)
Triglycerides: 95 mg/dL (ref <150)
VLDL: 19 mg/dL (ref <30)

## 2016-03-01 LAB — ALT: ALT: 15 U/L (ref 6–29)

## 2016-03-01 NOTE — Addendum Note (Signed)
Addended by: Eulis Foster on: 03/01/2016 08:48 AM   Modules accepted: Orders

## 2016-03-06 ENCOUNTER — Ambulatory Visit (INDEPENDENT_AMBULATORY_CARE_PROVIDER_SITE_OTHER): Payer: PPO | Admitting: Neurology

## 2016-03-06 ENCOUNTER — Encounter: Payer: Self-pay | Admitting: Neurology

## 2016-03-06 VITALS — BP 130/68 | HR 68 | Resp 16 | Ht 62.0 in | Wt 137.0 lb

## 2016-03-06 DIAGNOSIS — M48061 Spinal stenosis, lumbar region without neurogenic claudication: Secondary | ICD-10-CM

## 2016-03-06 DIAGNOSIS — R269 Unspecified abnormalities of gait and mobility: Secondary | ICD-10-CM | POA: Diagnosis not present

## 2016-03-06 DIAGNOSIS — G14 Postpolio syndrome: Secondary | ICD-10-CM

## 2016-03-06 DIAGNOSIS — M4806 Spinal stenosis, lumbar region: Secondary | ICD-10-CM

## 2016-03-06 MED ORDER — TRAMADOL HCL 50 MG PO TABS: 50.0000 mg | ORAL_TABLET | Freq: Three times a day (TID) | ORAL | Status: DC | PRN

## 2016-03-06 NOTE — Progress Notes (Signed)
[facility]  PATIENT: Kaitlyn Hoover DOB: [birth year]  REFERRING DOCTOR OR PCP:  [name], MD SOURCE: patient, records form Dr. Justin Mend, records in EMR  _________________________________   HISTORICAL  CHIEF COMPLAINT:  Chief Complaint  Patient presents with  . Post-Polio Syndrome    Sts. she has seen NS ([facility]--unsure which NS she saw).  Sts. she was presented with a surgical option for lumbar spinal stenosis, but she has concerns regarding surgery.  Sts. she was told she may have postop pain for 4 mos. after surgery, and possibly could have pain for the remainder of her life.  She would like to discuss further./fim  . Lumbar Spinal Stenosis    HISTORY OF PRESENT ILLNESS:  Kaitlyn Hoover is a 75 year old woman who has had progressive weakness in her legs over the past 20 years. She was diagnosed with post polio syndrome in 1998.    MRI of the lumbar spine showed severe spinal stenosis.    Her legs feel weaker and more painful as she walks more.   She has not had much lower back pain.   Pain is mildly better when she leans forward as she walks.      She saw Dr. Maebelle Munroe neurosurgery area and he discussed her treatment with her but she has many concerns about the long recovery and possibility that she would not get any better.  Polio History:   She had polio when she was 4 and it affected her legs, much more so on the right. Over the next year or 2 she recovered and was able to be very active as a child. Starting around age 89, she began to notice that she was getting weaker again in the legs. Initially the right leg was more involved but both legs seem to be involved now.   In 1998, she went to a post polio center in [place] and had a nerve conduction/EMG test performed confirming the history of polio and she was told that it was consistent with post polio syndrome.  Those results are not available at this time.  Bladder:  She does report some urinary incontinence at  times though usually just has frequency. She is on oxybutynin with benefit.   She denies neck pain or significant LBP She does get some mild discomfort in her legs at times.  Spinal stenosis: MRI of the lumbar spine shows severe spinal stenosis at L4-L5 with an AP diameter of only about 5. Mm.   There is also left greater than right lateral recess stenosis there that could be causing L5 nerve root compression. There is milder spinal stenosis at L5-S1 that could lead to left S1 nerve root compression.   She does report symptoms of leg pain as she walks that are generally better when she is leaning forward generally worse when she is standing up straight. She finds her legs become weaker and weaker as she walks.  Sometimes when she gets out of bed in the morning her legs freeze.    She has some discomfort in the mornings  REVIEW OF SYSTEMS: Constitutional: No fevers, chills, sweats, or change in appetite Eyes: No visual changes, double vision, eye pain Ear, nose and throat: No hearing loss, ear pain, nasal congestion, sore throat Cardiovascular: No chest pain, palpitations Respiratory: No shortness of breath at rest or with exertion.   No wheezes GastrointestinaI: No nausea, vomiting, diarrhea, abdominal pain, fecal incontinence Genitourinary: No dysuria, urinary retention or frequency.  No nocturia. Musculoskeletal: No  neck pain, back pain Integumentary: No rash, pruritus, skin lesions Neurological: as above Psychiatric: No depression at this time.  No anxiety Endocrine: No palpitations, diaphoresis, change in appetite, change in weigh or increased thirst Hematologic/Lymphatic: No anemia, purpura, petechiae. Allergic/Immunologic: No itchy/runny eyes, nasal congestion, recent allergic reactions, rashes  ALLERGIES: Allergies  Allergen Reactions  . Ciprocinonide [Fluocinolone] Rash  . Sulfa Antibiotics Rash    Childhood allergy    HOME MEDICATIONS:  Current outpatient prescriptions:    .  acyclovir (ZOVIRAX) 400 MG tablet, Take 400 mg by mouth daily. , Disp: , Rfl:  .  apixaban (ELIQUIS) 5 MG TABS tablet, Take 1 tablet (5 mg total) by mouth 2 (two) times daily., Disp: 180 tablet, Rfl: 3 .  Ascorbic Acid (VITAMIN C) 1000 MG tablet, Take 1,000 mg by mouth daily., Disp: , Rfl:  .  atorvastatin (LIPITOR) 40 MG tablet, Take 1 tablet (40 mg total) by mouth daily., Disp: 30 tablet, Rfl: 6 .  b complex vitamins tablet, Take 1 tablet by mouth daily., Disp: , Rfl:  .  Calcium Carbonate-Vitamin D (CALCIUM-CARB 600 + D PO), Take 2 tablets by mouth at bedtime., Disp: , Rfl:  .  cholecalciferol (VITAMIN D) 1000 UNITS tablet, Take 1,000 Units by mouth daily., Disp: , Rfl:  .  Coenzyme Q10 (CO Q-10) 100 MG CAPS, Take 100 mLs by mouth daily., Disp: , Rfl:  .  diltiazem (CARDIZEM CD) 180 MG 24 hr capsule, Take 1 capsule (180 mg total) by mouth daily., Disp: 90 capsule, Rfl: 2 .  fexofenadine (ALLEGRA) 180 MG tablet, Take 180 mg by mouth daily., Disp: , Rfl:  .  levothyroxine (SYNTHROID, LEVOTHROID) 100 MCG tablet, Take 100 mcg by mouth daily., Disp: , Rfl:  .  Magnesium 400 MG CAPS, Take 1 tablet by mouth daily., Disp: , Rfl:  .  Melatonin 5 MG CAPS, Take 5 mg by mouth at bedtime., Disp: , Rfl:  .  Multiple Vitamin (MULTIVITAMIN) capsule, Take 1 capsule by mouth 2 (two) times daily., Disp: , Rfl:  .  Omega-3 Fatty Acids (FISH OIL BURP-LESS) 1000 MG CAPS, Take 3,000 Units by mouth 2 (two) times daily., Disp: , Rfl:  .  temazepam (RESTORIL) 7.5 MG capsule, Take 7.5 mg by mouth at bedtime as needed for sleep., Disp: , Rfl:  .  ziprasidone (GEODON) 20 MG capsule, Take 20 mg by mouth daily., Disp: , Rfl:   PAST MEDICAL HISTORY: Past Medical History  Diagnosis Date  . Hypertension   . PAT (paroxysmal atrial tachycardia) ([place])   . Atrial fibrillation with RVR ([place])   . Post-polio syndrome   . Palpitations     PAST SURGICAL HISTORY: Past Surgical History  Procedure Laterality Date  . No  surgical hx      FAMILY HISTORY: Family History  Problem Relation Age of Onset  . Hypertension Mother   . Cancer Father     JAW BONE  . COPD Brother   . Atrial fibrillation Brother     SOCIAL HISTORY:  Social History   Social History  . Marital Status: Divorced    Spouse Name: N/A  . Number of Children: N/A  . Years of Education: N/A   Occupational History  . Not on file.   Social History Main Topics  . Smoking status: Former Research scientist (life sciences)  . Smokeless tobacco: Not on file  . Alcohol Use: Yes  . Drug Use: Not on file  . Sexual Activity: Not on file   Other Topics Concern  .  Not on file   Social History Narrative     PHYSICAL EXAM  There were no vitals filed for this visit.  There is no weight on file to calculate BMI.   General: The patient is well-developed and well-nourished and in no acute distress   Extremities:   Right leg 1/2 inch shorter than left  Musculoskeletal:  Back is nontender  Neurologic Exam  Mental status: The patient is alert and oriented x 3 at the time of the examination. The patient has apparent normal recent and remote memory, with an apparently normal attention span and concentration ability.   Speech is normal.  Cranial nerves: Extraocular movements are full.  There is good facial sensation to soft touch bilaterally.Facial strength is normal.  Trapezius and sternocleidomastoid strength is normal. No dysarthria is noted.  The tongue is midline, and the patient has symmetric elevation of the soft palate. No obvious hearing deficits are noted.  Motor:  Muscle bulk is reduced in right legl.   Tone is normal. Strength is  5 / 5 in arms and left leg.   Strength is 4+-5/5 in iliopsoas and quads, 4/5 hamstrings, 3/5 tib ant, and 1/5 EHL , 2/5 EDB..   Sensory: Sensory testing is intact to pinprick, soft touch and vibration sensation in all 4 extremities.  Coordination: Cerebellar testing reveals good finger-nose-finger and heel-to-shin  bilaterally.  Gait and station: Station is normal.   Gait has right foot drop. Tandem gait is wide. Romberg is negative.   Reflexes: Deep tendon reflexes are reduced in right leg compared to left.   Plantar responses are flexor.    DIAGNOSTIC DATA (LABS, IMAGING, TESTING) - I reviewed patient records, labs, notes, testing and imaging myself where available.  Lab Results  Component Value Date   WBC 7.9 03/03/2015   HGB 13.0 03/03/2015   HCT 39.0 03/03/2015   MCV 94.9 03/03/2015   PLT 219.0 03/03/2015      Component Value Date/Time   NA 138 03/03/2015 1411   K 3.8 03/03/2015 1411   CL 102 03/03/2015 1411   CO2 32 03/03/2015 1411   GLUCOSE 111* 03/03/2015 1411   BUN 26* 03/03/2015 1411   CREATININE 1.23* 03/03/2015 1411   CALCIUM 9.4 03/03/2015 1411   PROT 7.4 04/17/2014 2352   ALBUMIN 3.6 04/17/2014 2352   AST 29 04/17/2014 2352   ALT 15 03/01/2016 0848   ALKPHOS 81 04/17/2014 2352   BILITOT <0.2* 04/17/2014 2352   GFRNONAA 85* 08/15/2014 1851   GFRAA >90 08/15/2014 1851   Lab Results  Component Value Date   CHOL 117* 03/01/2016   HDL 44* 03/01/2016   LDLCALC 54 03/01/2016   TRIG 95 03/01/2016   CHOLHDL 2.7 03/01/2016   No results found for: HGBA1C No results found for: VITAMINB12 Lab Results  Component Value Date   TSH 0.748 04/18/2014       ASSESSMENT AND PLAN  Post poliomyelitis syndrome  Gait disturbance  Spinal stenosis of lumbar region   1.   We had a further discussion about her spinal stenosis and possibility of surgery. I believe that her worsening gait is a combination of the spinal stenosis and her postpolio syndrome. The pain that she experiences with walking is more likely due to entirely from the spinal stenosis. I think she would have a reasonable chance of improved gait function is decompressive surgery was performed, though unfortunately this can't be guaranteed. Pain at baseline is minimal and hopefully she would not have too much pain  after the surgery. She is going to give surgery some additional thought and get back to Dr. Cyndy Freeze if she opts to proceed.. Today, I reviewed the MRI of her lumbar spine in her presence. 2.    She asked about stem cells and postpolio syndrome. Unfortunately, stem cell centers with [employer] and ridiculous claims are popping up all over the country. Amazingly, some are even charging patients thousands of dollars to be part of a "research study".  I would recommend that she not consider the liposuction/stem cell procedures as there is no evidence that this would help her in any way. There has been some legitimate stem cell research at academic centers with motor neuron disease such as ALS though I am unfamiliar with anything involving postpolio at this time. 3.    She will continue her current medications. 4.  She will return to see me in 4 months or sooner if there are new or worsening neurologic symptoms.  45 minutes face-to-face evaluation with greater than one half of the time counseling and coordinating care about her spinal stenosis, post polio and various treatment options.   Gevorg Brum A. Felecia Shelling, MD, PhD [id number], [id number] AM Certified in Neurology, Clinical Neurophysiology, Sleep Medicine, Pain Medicine and Neuroimaging  [facility] [address], [place], [place] [address] [phone]

## 2016-03-08 ENCOUNTER — Other Ambulatory Visit: Payer: Self-pay | Admitting: *Deleted

## 2016-03-08 MED ORDER — ATORVASTATIN CALCIUM 40 MG PO TABS: 40.0000 mg | ORAL_TABLET | Freq: Every day | ORAL | Status: DC

## 2016-03-18 ENCOUNTER — Other Ambulatory Visit: Payer: Self-pay | Admitting: *Deleted

## 2016-03-18 MED ORDER — APIXABAN 5 MG PO TABS: 5.0000 mg | ORAL_TABLET | Freq: Two times a day (BID) | ORAL | Status: DC

## 2016-03-25 ENCOUNTER — Telehealth: Payer: Self-pay | Admitting: Cardiology

## 2016-03-25 ENCOUNTER — Telehealth: Payer: Self-pay | Admitting: Neurology

## 2016-03-25 NOTE — Telephone Encounter (Signed)
Pt called said she very constipated with the traMADol (ULTRAM) 50 MG tablet . Said she has went 3 days without BM. Pt is inquiring if there another medication she could try that would not cause constipation.

## 2016-03-25 NOTE — Telephone Encounter (Signed)
New message      Pt c/o medication issue:  1. Name of Medication: atorvastatin 2. How are you currently taking this medication (dosage and times per day)? 40mg  daily 3. Are you having a reaction (difficulty breathing--STAT)? no 4. What is your medication issue? Pt stopped medication last Friday.  She cannot take it.  It makes her have severe muscle cramps.  Pt states that she has leg problems and if the muscle cramps does not go away, she will restart the atorvastatin.

## 2016-03-25 NOTE — Telephone Encounter (Signed)
Left message to call back  

## 2016-03-25 NOTE — Telephone Encounter (Signed)
Patient st she has taken Lipitor as directed for 6 weeks and can barely walk her legs ache so badly. She has tried Pravastatin in the past and had similar issues.  She offers to stay off the Lipitor for 2 weeks and see if cramping subsides. If it doesn't, she will restart the medication (she st she has problems with her legs anyway so she isn't 100% sure it's the Lipitor - the problems have just worsened since starting). Instructed the patient to continue to hold her Lipitor and Dr. Marlou Porch will be consulted for medication recommendations.  The patient is interested in trying a different statin than Lipitor and pravastatin.

## 2016-03-25 NOTE — Telephone Encounter (Signed)
I have spoken with Kaitlyn Hoover and per RAS, advised she take otc stool stofteners/laxatives to help with constipation--that there really isn't another pain med that will cause less constipation.  She verbalized understanding of same, sts. MOM helped--she will take this/fim

## 2016-04-01 NOTE — Telephone Encounter (Signed)
Continue to hold atorvastatin. Please set up with lipid clinic for further suggestions.  Candee Furbish, MD

## 2016-04-01 NOTE — Telephone Encounter (Signed)
Pt aware and will continue to hold atorvastatin.  She is able someone will be calling her to schedule with the Lipid Clinic.

## 2016-04-12 ENCOUNTER — Ambulatory Visit (INDEPENDENT_AMBULATORY_CARE_PROVIDER_SITE_OTHER): Payer: PPO | Admitting: Pharmacist

## 2016-04-12 DIAGNOSIS — E785 Hyperlipidemia, unspecified: Secondary | ICD-10-CM

## 2016-04-12 MED ORDER — ROSUVASTATIN CALCIUM 10 MG PO TABS: 10.0000 mg | ORAL_TABLET | Freq: Every day | ORAL | Status: DC

## 2016-04-12 NOTE — Patient Instructions (Signed)
Start taking rosuvastatin (Crestor) 10mg  daily.   If you start to develop increasing pain in your legs that will not improve, stop taking the rosuvastatin until your pain becomes manageable again.  Once your pain becomes manageable, resume rosuvastatin three times a week. You may choose any three days of the week but separate doses by at least 24hrs  If your pain returns, call our clinic [phone]

## 2016-04-12 NOTE — Progress Notes (Signed)
Patient ID: Kaitlyn Hoover                 DOB: 1941-03-04                    MRN: [id number]     HPI: Kaitlyn Hoover is a 75 y.o. female patient referred to lipid clinic by Dr. Marlou Porch. PMH significant for carotid artery disease, TIA (per patient), Afib, HTN, spinal stenosis, and post poliomyelitis syndrome. Atorvastatin 40mg  started in Mar 2017 in the setting of carotid artery disease. Pt reports a 6 week trial with atorvastatin and developed myalgias to the point of causing difficulty walking. Reports having chronic pain in lower extremities due to post poliomyelitis syndrome and spinal stenosis therefore, pt not sure if pain due to atorvastatin - problems have just worsened since starting. Has had a similar issue with pravastatin in the past. States pain in LE has subsided since discontinuing the atorvastatin. Currently pursuing weight loss with goal of 1lb per week.  Current Medications: None Intolerances: Pravastatin 20mg , atorvastatin 40mg  - worsening myalgia Risk Factors: TIA, carotid artery disease LDL goal: <70 mg/dL  Family History: Includes HTN - mother, cancer - father, COPD/Afib - brother  Social History: Former smoker (unsure of quit date). Denies EtOH or illicit drugs  0000000 Lipid Panel (no therapy): TC 226, TG 167, HDL 41, LDL 152 - LFTs WNL 03/01/2016 Lipid Panel (atorva 40mg ): TC 117, TG 95, HDL 44, LDL 54   Past Medical History  Diagnosis Date  . Hypertension   . PAT (paroxysmal atrial tachycardia) ([place])   . Atrial fibrillation with RVR ([place])   . Post-polio syndrome   . Palpitations     Current Outpatient Prescriptions on File Prior to Visit  Medication Sig Dispense Refill  . acyclovir (ZOVIRAX) 400 MG tablet Take 400 mg by mouth daily.     Marland Kitchen apixaban (ELIQUIS) 5 MG TABS tablet Take 1 tablet (5 mg total) by mouth 2 (two) times daily. 180 tablet 1  . Ascorbic Acid (VITAMIN C) 1000 MG tablet Take 1,000 mg by mouth daily.    Marland Kitchen b complex vitamins tablet Take 1  tablet by mouth daily.    . Calcium Carbonate-Vitamin D (CALCIUM-CARB 600 + D PO) Take 2 tablets by mouth at bedtime.    . cholecalciferol (VITAMIN D) 1000 UNITS tablet Take 1,000 Units by mouth daily.    . Coenzyme Q10 (CO Q-10) 100 MG CAPS Take 100 mLs by mouth daily.    Marland Kitchen diltiazem (CARDIZEM CD) 180 MG 24 hr capsule Take 1 capsule (180 mg total) by mouth daily. 90 capsule 2  . fexofenadine (ALLEGRA) 180 MG tablet Take 180 mg by mouth daily.    Marland Kitchen levothyroxine (SYNTHROID, LEVOTHROID) 100 MCG tablet Take 100 mcg by mouth daily.    . Magnesium 400 MG CAPS Take 1 tablet by mouth daily.    . Melatonin 5 MG CAPS Take 5 mg by mouth at bedtime.    . Multiple Vitamin (MULTIVITAMIN) capsule Take 1 capsule by mouth 2 (two) times daily.    . Omega-3 Fatty Acids (FISH OIL BURP-LESS) 1000 MG CAPS Take 3,000 Units by mouth 2 (two) times daily.    . temazepam (RESTORIL) 7.5 MG capsule Take 7.5 mg by mouth at bedtime as needed for sleep.    . traMADol (ULTRAM) 50 MG tablet Take 1 tablet (50 mg total) by mouth 3 (three) times daily as needed. 90 tablet 5  . ziprasidone (GEODON) 20 MG  capsule Take 20 mg by mouth daily.     No current facility-administered medications on file prior to visit.    Allergies  Allergen Reactions  . Ciprocinonide [Fluocinolone] Rash  . Sulfa Antibiotics Rash    Childhood allergy    Assessment/Plan:  1.) Hyperlipidemia: LDL 54 at goal of <70 mg/dL on atorvastatin 40mg . Discontinued in Apr 2017 due to worsening myalgias. Discussed therapy options with pt and is willing to trial another statin. Will start rosuvastatin 10mg  daily. Instructed patient to stop medication if she begins to experience further myalgias. Will then hold rosuvastatin until pain resolves and switch to 3x weekly dosing. Pt agreeable to this plan and will call clinic with any issues/concerns. Follow up in 3 months for office visit and lipid panel/LFT  Stephens November, PharmD Clinical Pharmacy Resident 2:52 PM,  04/12/2016.

## 2016-04-16 DIAGNOSIS — F28 Other psychotic disorder not due to a substance or known physiological condition: Secondary | ICD-10-CM | POA: Diagnosis not present

## 2016-04-24 ENCOUNTER — Telehealth: Payer: Self-pay | Admitting: Neurology

## 2016-04-24 NOTE — Telephone Encounter (Signed)
Pt called said she can hardly walk, has to take 3 tramadol/day and thinks she will need the surgery. She is wondering if she should come back and see Dr Felecia Shelling or Dr Christella Noa. Please call and direct her. Thank you

## 2016-04-25 NOTE — Telephone Encounter (Signed)
I have spoken with Kaitlyn Hoover this morning, and per RAS last ov note, advised that he felt a good bit of her pain was due to spinal stenosis, and she would likely benefit from surgery.  The next appropriate step for her would be to f/u with Dr. Christella Noa.  She is agreeable with this.  She has already met with Dr. Christella Noa once and sts. she was comfortable with him.  She will call and sched. a f/u appt. with him.  She does not need anything further from our office at this time/fim

## 2016-04-30 DIAGNOSIS — J0100 Acute maxillary sinusitis, unspecified: Secondary | ICD-10-CM | POA: Diagnosis not present

## 2016-05-06 ENCOUNTER — Other Ambulatory Visit: Payer: Self-pay | Admitting: Neurosurgery

## 2016-05-14 DIAGNOSIS — F28 Other psychotic disorder not due to a substance or known physiological condition: Secondary | ICD-10-CM | POA: Diagnosis not present

## 2016-05-22 ENCOUNTER — Telehealth: Payer: Self-pay | Admitting: Pharmacist

## 2016-05-22 NOTE — Telephone Encounter (Signed)
Pt called to report she continued to have myalgias with Crestor.  She started with Crestor 10mg  daily and could not tolerate this.  She decreased to 3 times a week with no relief in symptoms.  We discussed options including PCSK-9 inhibitors or clinical trials.  She was not interested in either one at the moment.  She states she has lost 7 lbs since her visit and will just continue lifestyle modifications.  She has a back surgery later this fall and will hopefully be more active at that point.

## 2016-05-31 ENCOUNTER — Ambulatory Visit: Payer: PPO | Admitting: Cardiology

## 2016-06-11 DIAGNOSIS — F28 Other psychotic disorder not due to a substance or known physiological condition: Secondary | ICD-10-CM | POA: Diagnosis not present

## 2016-06-12 ENCOUNTER — Telehealth: Payer: Self-pay | Admitting: Cardiology

## 2016-06-12 NOTE — Telephone Encounter (Signed)
New message    Pt went to get Eliquis today and it was $400. Not going to be able to get the prescription. She is trying to get help with [facility]. She sent the information via text but her name was not on the form. Please keep an eye out for it.   Patient calling the office for samples of medication:   1.  What medication and dosage are you requesting samples for? Eliquis 5 mg  2.  Are you currently out of this medication? She has a week supply left. Has an appt in the morning, would like to pick-up the medicine then.

## 2016-06-13 ENCOUNTER — Encounter: Payer: Self-pay | Admitting: Cardiology

## 2016-06-13 ENCOUNTER — Telehealth: Payer: Self-pay | Admitting: Cardiology

## 2016-06-13 ENCOUNTER — Ambulatory Visit (INDEPENDENT_AMBULATORY_CARE_PROVIDER_SITE_OTHER): Payer: PPO | Admitting: Cardiology

## 2016-06-13 VITALS — BP 118/62 | HR 69 | Ht 62.0 in | Wt 129.8 lb

## 2016-06-13 DIAGNOSIS — Z7901 Long term (current) use of anticoagulants: Secondary | ICD-10-CM | POA: Diagnosis not present

## 2016-06-13 DIAGNOSIS — Z789 Other specified health status: Secondary | ICD-10-CM

## 2016-06-13 DIAGNOSIS — I480 Paroxysmal atrial fibrillation: Secondary | ICD-10-CM

## 2016-06-13 DIAGNOSIS — I739 Peripheral vascular disease, unspecified: Secondary | ICD-10-CM

## 2016-06-13 DIAGNOSIS — E785 Hyperlipidemia, unspecified: Secondary | ICD-10-CM | POA: Diagnosis not present

## 2016-06-13 DIAGNOSIS — I779 Disorder of arteries and arterioles, unspecified: Secondary | ICD-10-CM | POA: Diagnosis not present

## 2016-06-13 DIAGNOSIS — Z889 Allergy status to unspecified drugs, medicaments and biological substances status: Secondary | ICD-10-CM

## 2016-06-13 MED ORDER — DILTIAZEM HCL 60 MG PO TABS: 60.0000 mg | ORAL_TABLET | Freq: Four times a day (QID) | ORAL | 3 refills | Status: DC | PRN

## 2016-06-13 MED ORDER — APIXABAN 5 MG PO TABS: 5.0000 mg | ORAL_TABLET | Freq: Two times a day (BID) | ORAL | 1 refills | Status: DC

## 2016-06-13 NOTE — Patient Instructions (Addendum)
Medication Instructions:  You may take Diltiazem 60 mg up to 4 times a day as needed for palpitations. Continue all other medications as listed.  Follow-Up Follow up in 6 months with Dr. Marlou Porch.  You will receive a letter in the mail 2 months before you are due.  Please call us when you receive this letter to schedule your follow up appointment.  If you need a refill on your cardiac medications before your next appointment, please call your pharmacy.  Thank you for choosing [facility]!!

## 2016-06-13 NOTE — Progress Notes (Signed)
Kaitlyn Hoover. [address], [address] [place],   [address] Phone: [phone] Fax:  [phone]  Date:  06/13/2016   ID:  Kaitlyn Hoover, DOB 1941-02-02, MRN [id number]  PCP:  Jonathon Bellows, MD   History of Present Illness: Kaitlyn Hoover is a 75 y.o. female "Dionne Milo for atrial fibrillation with recent hospitalization, discharged on 04/19/14. She was initially started on Cardizem drip with spontaneous conversion to sinus rhythm. Lab work was normal. AFIB was 133. Symptomatic. She is a Marine scientist.   She had approximate 5 episodes of atrial fibrillation since she was first diagnosed. She's been to the emergency room twice for this.  Her brother was diagnosed as well with atrial fibrillation and diltiazem seems to working well for him.  Usually has SVT in the past which can be terminated with Valsalva.   AFIB was not prompted by any fevers, recent surgeries, thyroid issues. She has one glass of wine per night.  After she converted, she felt well.  Had another episode prompting ER visit 08/15/14-extra dose of diltiazem, sent home.  When asked if she had a history of coronary artery disease, she denies. This was listed in her past medical history.  Since putting her on diltiazem 180 mg, she has been doing quite well. She did have one episode which lasted less than 12 hours. During this episode, her heart rate was not quite as fast as it had been in the past. No bleeding, no syncope.  03/03/15 - last Oct 26 2014 last episode. Good on Diltaizem.   09/04/15 - doing really well. No episodes. Occasionally will feel SVT, Valsalva. No chest pain. She reminded me that one episode in the hospital was not paid for by her insurance. Used to work for home health.   Wt Readings from Last 3 Encounters:  06/13/16 129 lb 12.8 oz (58.9 kg)  03/06/16 137 lb (62.1 kg)  01/12/16 138 lb (62.6 kg)     Past Medical History:  Diagnosis Date  . Atrial fibrillation with RVR ([place])   . Hypertension   .  Palpitations   . PAT (paroxysmal atrial tachycardia) ([place])   . Post-polio syndrome     Past Surgical History:  Procedure Laterality Date  . no surgical hx      Current Outpatient Prescriptions  Medication Sig Dispense Refill  . acyclovir (ZOVIRAX) 400 MG tablet Take 400 mg by mouth daily.     Marland Kitchen apixaban (ELIQUIS) 5 MG TABS tablet Take 1 tablet (5 mg total) by mouth 2 (two) times daily. 180 tablet 1  . Ascorbic Acid (VITAMIN C) 1000 MG tablet Take 1,000 mg by mouth daily.    . Calcium Carbonate-Vitamin D (CALCIUM-CARB 600 + D PO) Take 2 tablets by mouth at bedtime.    . Coenzyme Q10 (CO Q-10) 100 MG CAPS Take 100 mLs by mouth daily.    Marland Kitchen diltiazem (CARDIZEM CD) 180 MG 24 hr capsule Take 1 capsule (180 mg total) by mouth daily. 90 capsule 2  . FANAPT 2 MG TABS Take 2 mg by mouth daily.    . fexofenadine (ALLEGRA) 180 MG tablet Take 180 mg by mouth daily.    Marland Kitchen levothyroxine (SYNTHROID, LEVOTHROID) 100 MCG tablet Take 100 mcg by mouth daily.    . Melatonin 3 MG TABS Take 3 mg by mouth at bedtime.    . Multiple Vitamin (MULTIVITAMIN) capsule Take 1 capsule by mouth 2 (two) times daily.    . Omega-3 Fatty Acids (  FISH OIL BURP-LESS) 1000 MG CAPS Take 3,000 Units by mouth 2 (two) times daily.    . temazepam (RESTORIL) 15 MG capsule Take 15 mg by mouth at bedtime as needed for sleep.    . traMADol (ULTRAM) 50 MG tablet Take 1 tablet (50 mg total) by mouth 3 (three) times daily as needed. 90 tablet 5  . ziprasidone (GEODON) 20 MG capsule Take 20 mg by mouth daily.    Marland Kitchen diltiazem (CARDIZEM) 60 MG tablet Take 1 tablet (60 mg total) by mouth 4 (four) times daily as needed. 30 tablet 3   No current facility-administered medications for this visit.     Allergies:    Allergies  Allergen Reactions  . Ciprocinonide [Fluocinolone] Rash  . Sulfa Antibiotics Rash    Childhood allergy    Social History:  The patient  reports that she has quit smoking. She does not have any smokeless tobacco history  on file. She reports that she drinks alcohol.   Family History  Problem Relation Age of Onset  . Hypertension Mother   . Cancer Father     JAW BONE  . COPD Brother   . Atrial fibrillation Brother   Mother died in 62's from accident. Has HTN.   ROS:  Please see the history of present illness.      All other systems reviewed and negative.   PHYSICAL EXAM: VS:  BP 118/62 (Cuff Size: Normal)   Pulse 69   Ht 5\' 2"  (1.575 m)   Wt 129 lb 12.8 oz (58.9 kg)   BMI 23.74 kg/m  Well nourished, thin in no acute distress  HEENT: normal, [place]/AT, EOMI, hearing aids, mild speech impairment Neck: no JVD, normal carotid upstroke, no bruit Cardiac:  normal S1, S2; RRR; no murmur  Lungs:  clear to auscultation bilaterally, no wheezing, rhonchi or rales  Abd: soft, nontender, no hepatomegaly, no bruits  Ext: no edema, 2+ distal pulses Skin: warm and dry  GU: deferred Neuro: no focal abnormalities noted, AAO x 3  EKG: 04/18/14 at 10:18 AM, sinus bradycardia rate 57, no other abnormalities.   ECHO: 04/18/14 - Left ventricle: The cavity size was normal. Wall thickness was normal. Systolic function was normal. The estimated ejection fraction was in the range of 60% to 65%. Wall motion was normal; there were no regional wall motion abnormalities. - Left atrium: The atrium was mildly dilated. - Atrial septum: No defect or patent foramen ovale was identified. - Pulmonary arteries: PA peak pressure: 32 mm Hg (S).  Labs: 04/17/14-TSH normal, creatinine 0.79. Prior medical records reviewed, ER visit reviewed.   ASSESSMENT AND PLAN:  1. Paroxysmal atrial fibrillation- She is doing very well. Last episode December 2015. Diltiazem. CHADS-VASc at least 3 (Female, AGE, HTN).  Eliquis. Stopped Plavix previously. She is symptomatic with her episodes. Her brother, was diagnosed with atrial fibrillation around the same time. He seems to be doing very well with diltiazem, thus we transitioned her previously from  atenolol to diltiazem (stopped her atenolol 50 mg twice a day). She is only drinking a glass of wine nightly. I have answered several questions for her. Continue her anticoagulation.  Echocardiogram with mild left atrial enlargement. Overall reassuring. I prescribed her diltiazem 60 mg by mouth to be taken when necessary if necessary atrial fibrillation. Next step would be to use flecainide. Please let us know. She is not interested at this time adding flecainide. Overall she's doing very well. She likes the diltiazem. 2. Essential hypertension-currently well controlled.  3. Chronic anticoagulation- Eliquis. Cost worry. Filling out patient assistance form. Creatinine, hemoglobin previously normal. Blood work with Dr. Justin Mend 4. Carotid artery stenosis notes that she has had test before which showed moderate amount of right carotid artery plaque. We will check ultrasound. Carotid.  5. Statin intolerance - tried Crestor, could not tolerate, even decreased frequency. Discussed with Gay Filler. We may try Crestor once a week after she has her back surgery. 6. 85-month follow-up  Signed, Candee Furbish, MD [facility]  06/13/2016 10:32 AM

## 2016-06-13 NOTE — Telephone Encounter (Signed)
Pt wants printed Rx for Eliquis

## 2016-06-13 NOTE — Telephone Encounter (Signed)
Pt was here for an appt today with Dr Marlou Porch.  His portion of the Pt Assistance paperwork for BMS was filled and given to the pt to complete.  She reports she has done this in the past and knows how to complete it.  She will c/b if further needs.

## 2016-06-13 NOTE — Telephone Encounter (Signed)
°  New Prob   Pt has a question regarding her prescription assistance form given to her today. Please call.

## 2016-06-14 ENCOUNTER — Telehealth: Payer: Self-pay | Admitting: Cardiology

## 2016-06-14 NOTE — Telephone Encounter (Signed)
Wonderful. Thank you.

## 2016-06-14 NOTE — Telephone Encounter (Signed)
New message         The patient asst program is going to send a fax but the pt has been approved on Eliquis through 2017   Just to let the staff be aware

## 2016-07-05 DIAGNOSIS — M2669 Other specified disorders of temporomandibular joint: Secondary | ICD-10-CM | POA: Diagnosis not present

## 2016-07-16 ENCOUNTER — Other Ambulatory Visit: Payer: PPO | Admitting: *Deleted

## 2016-07-16 ENCOUNTER — Encounter: Payer: Self-pay | Admitting: Cardiology

## 2016-07-16 DIAGNOSIS — E785 Hyperlipidemia, unspecified: Secondary | ICD-10-CM | POA: Diagnosis not present

## 2016-07-16 LAB — LIPID PANEL
CHOL/HDL RATIO: 4 ratio (ref <=5.0)
CHOLESTEROL: 173 mg/dL (ref 125–200)
HDL: 43 mg/dL — ABNORMAL LOW (ref >=46)
LDL CALC: 109 mg/dL (ref <130)
TRIGLYCERIDES: 105 mg/dL (ref <150)
VLDL: 21 mg/dL (ref <30)

## 2016-07-16 LAB — HEPATIC FUNCTION PANEL
ALT: 12 U/L (ref 6–29)
AST: 18 U/L (ref 10–35)
Albumin: 3.8 g/dL (ref 3.6–5.1)
Alkaline Phosphatase: 59 U/L (ref 33–130)
BILIRUBIN DIRECT: 0.1 mg/dL (ref <=0.2)
BILIRUBIN INDIRECT: 0.3 mg/dL (ref 0.2–1.2)
TOTAL PROTEIN: 6.8 g/dL (ref 6.1–8.1)
Total Bilirubin: 0.4 mg/dL (ref 0.2–1.2)

## 2016-07-17 ENCOUNTER — Ambulatory Visit (INDEPENDENT_AMBULATORY_CARE_PROVIDER_SITE_OTHER): Payer: PPO | Admitting: Pharmacist

## 2016-07-17 DIAGNOSIS — E785 Hyperlipidemia, unspecified: Secondary | ICD-10-CM | POA: Diagnosis not present

## 2016-07-17 MED ORDER — ROSUVASTATIN CALCIUM 10 MG PO TABS: ORAL_TABLET | ORAL | 11 refills | Status: DC

## 2016-07-17 NOTE — Patient Instructions (Addendum)
START taking Crestor (rosuvastatin) 10mg  once a week as tolerated.  Recheck cholesterol in 3 months on Tuesday, December 5th. Come in any time after 7:30am for fasting blood work.  Follow up in lipid clinic on Wednesday, December 6th at 11am for lipid visit.  Call Mohamadou Maciver in clinic with any questions about your cholesterol [phone].

## 2016-07-17 NOTE — Progress Notes (Signed)
Patient ID: Kaitlyn Hoover                 DOB: [birth year]                    MRN: [id number]     HPI: Kaitlyn Hoover is a 75 y.o. female patient referred to lipid clinic by Dr. Marlou Porch. She was seen by our clinic in the past, and presents today for 3 month f/u. PMH is significant for carotid artery disease, TIA (per patient), Afib, HTN, spinal stenosis, and post poliomyelitis syndrome.   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 At last OV with our lipid clinic, she was started on rosuvastatin 10mg  daily, and instructed to decrease her dose to a few times a week if she experienced side effects. Reports she tried 3x weekly dosing for about 2 weeks, but had to stop it due to worsening muscle pains. She believes her leg pains are multi-factorial, and is uncertain if her muscle pain is from spinal stenosis/post-poliomyelitis syndrome or from statin. Reports having a surgery scheduled for spinal stenosis on 07/31/2016, and she believes this will help with pain and her tolerance with statins.   She is taking fish oil 3g BID without history of elevated triglycerides. Pt reports her psychiatrist started her on this as a mood stabilizer.  Current Medications: omega-3 3g BID (for mood stabilization) Intolerances: pravastatin 20mg , atorvastatin 40mg , rosuvastatin 10mg  daily and TIW- worsening myalgias Risk Factors: TIA, carotid artery disease LDL goal: <70 mg/dL  Diet: fruits, skim milks, grain waffles, yogurt, vegetables, doesn't eat much meat, 1-2x week of frozen burger, doesn't eat out much (1x/week)  Exercise: Fairly limited due to severe muscle pains. Reports using motorized shopping cart at stores. Used to swim, but stopped due to pain.   Family History: Includes HTN - mother, cancer - father, COPD/Afib - brother  Social History: Quit smoking. Does not have smokeless  tobacco history on file.  Labs: (07/16/2016) TC 173, TG 105, HDL 43, LDL 109, AST 18, ALT 12 (omega-3 3g BID)  (03/01/2016) TC 117, TG 95, HDL 44, LDL 54, ALT 15 (atorva 40mg )  (01/09/2016) TC 226, TG 167, HDL 41, LDL 152 (no therapy)    Past Medical History:  Diagnosis Date  . Atrial fibrillation with RVR ([place])   . Hypertension   . Palpitations   . PAT (paroxysmal atrial tachycardia) ([place])   . Post-polio syndrome     Current Outpatient Prescriptions on File Prior to Visit  Medication Sig Dispense Refill  . acyclovir (ZOVIRAX) 400 MG tablet Take 400 mg by mouth daily.     Marland Kitchen apixaban (ELIQUIS) 5 MG TABS tablet Take 1 tablet (5 mg total) by mouth 2 (two) times daily. 180 tablet 1  . Ascorbic Acid (VITAMIN C) 1000 MG tablet Take 1,000 mg by mouth daily.    . Calcium Carbonate-Vitamin D (CALCIUM-CARB 600 + D PO) Take 2 tablets by mouth at bedtime.    . Coenzyme Q10 (CO Q-10) 100 MG CAPS Take 100 mLs by mouth daily.    Marland Kitchen diltiazem (CARDIZEM CD) 180 MG 24 hr capsule Take 1 capsule (180 mg total) by mouth daily. 90 capsule 2  . diltiazem (CARDIZEM) 60 MG tablet Take 1 tablet (60 mg total) by mouth 4 (four) times daily as needed. 30 tablet 3  . FANAPT 2 MG TABS Take 2 mg by  mouth daily.    . fexofenadine (ALLEGRA) 180 MG tablet Take 180 mg by mouth daily.    Marland Kitchen levothyroxine (SYNTHROID, LEVOTHROID) 100 MCG tablet Take 100 mcg by mouth daily.    . Melatonin 3 MG TABS Take 3 mg by mouth at bedtime.    . Multiple Vitamin (MULTIVITAMIN) capsule Take 1 capsule by mouth 2 (two) times daily.    . Omega-3 Fatty Acids (FISH OIL BURP-LESS) 1000 MG CAPS Take 3,000 Units by mouth 2 (two) times daily.    . temazepam (RESTORIL) 15 MG capsule Take 15 mg by mouth at bedtime as needed for sleep.    . traMADol (ULTRAM) 50 MG tablet Take 1 tablet (50 mg total) by mouth 3 (three) times daily as needed. 90 tablet 5  . ziprasidone (GEODON) 20 MG capsule Take 20 mg by mouth daily.     No current  facility-administered medications on file prior to visit.     Allergies  Allergen Reactions  . Ciprocinonide [Fluocinolone] Rash  . Sulfa Antibiotics Rash    Childhood allergy    Assessment/Plan:  1. Hyperlipidemia with CAD: LDL was at goal of <70 mg/dL on atorvastatin 40mg , however pt experienced myalgias with this dose. Her most recent LDL was 109 without any therapy. Patient agreeable to rechallenge with Crestor 10mg  once a week. Advised pt she can cut tablet into half and take 5mg  once a week if muscle pain develops with 10mg  weekly. Also advised her to call the clinic if she experiences worsening muscle pains. Will f/u with lipid panel and OV in 3 months with plan to start Zetia if LDL still elevated.   Kaitlyn Hoover E. Kaitlyn Hoover, PharmD, [place] [id number], [place], [place] [address] Phone: [phone]; Fax: [phone] 07/17/2016 12:15 PM

## 2016-07-24 ENCOUNTER — Encounter (HOSPITAL_COMMUNITY)
Admission: RE | Admit: 2016-07-24 | Discharge: 2016-07-24 | Disposition: A | Discharge status: Home / Self Care | Payer: PPO | Source: Ambulatory Visit | Attending: Neurosurgery | Admitting: Neurosurgery

## 2016-07-24 ENCOUNTER — Encounter (HOSPITAL_COMMUNITY): Payer: Self-pay

## 2016-07-24 DIAGNOSIS — Z01812 Encounter for preprocedural laboratory examination: Secondary | ICD-10-CM | POA: Diagnosis not present

## 2016-07-24 HISTORY — DX: Respiratory tuberculosis unspecified: A15.9

## 2016-07-24 HISTORY — DX: Anxiety disorder, unspecified: F41.9

## 2016-07-24 HISTORY — DX: Hypothyroidism, unspecified: E03.9

## 2016-07-24 HISTORY — DX: Bipolar disorder, unspecified: F31.9

## 2016-07-24 HISTORY — DX: Chronic sinusitis, unspecified: J32.9

## 2016-07-24 HISTORY — DX: Unspecified osteoarthritis, unspecified site: M19.90

## 2016-07-24 HISTORY — DX: Gastro-esophageal reflux disease without esophagitis: K21.9

## 2016-07-24 HISTORY — DX: Cardiac arrhythmia, unspecified: I49.9

## 2016-07-24 LAB — BASIC METABOLIC PANEL
ANION GAP: 6 (ref 5–15)
BUN: 21 mg/dL — ABNORMAL HIGH (ref 6–20)
CALCIUM: 9.3 mg/dL (ref 8.9–10.3)
CO2: 27 mmol/L (ref 22–32)
Chloride: 105 mmol/L (ref 101–111)
Creatinine, Ser: 0.94 mg/dL (ref 0.44–1.00)
GFR calc Af Amer: >60 mL/min (ref >60)
GFR, EST NON AFRICAN AMERICAN: 58 mL/min — AB (ref >60)
GLUCOSE: 98 mg/dL (ref 65–99)
Potassium: 3.9 mmol/L (ref 3.5–5.1)
SODIUM: 138 mmol/L (ref 135–145)

## 2016-07-24 LAB — CBC
HCT: 39.5 % (ref 36.0–46.0)
Hemoglobin: 12.7 g/dL (ref 12.0–15.0)
MCH: 30.5 pg (ref 26.0–34.0)
MCHC: 32.2 g/dL (ref 30.0–36.0)
MCV: 95 fL (ref 78.0–100.0)
PLATELETS: 227 10*3/uL (ref 150–400)
RBC: 4.16 MIL/uL (ref 3.87–5.11)
RDW: 13 % (ref 11.5–15.5)
WBC: 7.9 10*3/uL (ref 4.0–10.5)

## 2016-07-24 LAB — SURGICAL PCR SCREEN
MRSA, PCR: NEGATIVE
STAPHYLOCOCCUS AUREUS: NEGATIVE

## 2016-07-24 NOTE — Pre-Procedure Instructions (Signed)
Kaitlyn Hoover  07/24/2016      [facility] # [place], [place] [address] Phone: [phone] Fax: [phone]    Your procedure is scheduled on   Wednesday  07/31/16  Report to [facility] Admitting at 630 A.M.  Call this number if you have problems the morning of surgery:  [phone]   Remember:  Do not eat food or drink liquids after midnight.  Take these medicines the morning of surgery with A SIP OF WATER   DILTIAZEM (CARDIZEM), LEVOTHYROXINE, OMEPRAZOLE (PRILOSEC) OXYBUTYNIN, TRAMADOL IF NEEDED    (STOP ELIQUIS AS DIRECTED  ( OMEGA 3 FISH OIL, MULTIVITAMIN, GINKO BILOBA, CO Q 1 0, IBUPROFEN/ ADVIL/ MOTRIN, GOODY POWDERS, BC'S, HERBAL MEDICINES)              Do not wear jewelry, make-up or nail polish.  Do not wear lotions, powders, or perfumes, or deoderant.  Do not shave 48 hours prior to surgery.  Men may shave face and neck.  Do not bring valuables to the hospital.  [facility] is not responsible for any belongings or valuables.  Contacts, dentures or bridgework may not be worn into surgery.  Leave your suitcase in the car.  After surgery it may be brought to your room.  For patients admitted to the hospital, discharge time will be determined by your treatment team.  Patients discharged the day of surgery will not be allowed to drive home.   Name and phone number of your driver:    Special instructions:  [place] - Preparing for Surgery  Before surgery, you can play an important role.  Because skin is not sterile, your skin needs to be as free of germs as possible.  You can reduce the number of germs on you skin by washing with CHG (chlorahexidine gluconate) soap before surgery.  CHG is an antiseptic cleaner which kills germs and bonds with the skin to continue killing germs even after washing.  Please DO NOT use if you have an allergy to CHG or antibacterial soaps.  If your skin becomes  reddened/irritated stop using the CHG and inform your nurse when you arrive at Short Stay.  Do not shave (including legs and underarms) for at least 48 hours prior to the first CHG shower.  You may shave your face.  Please follow these instructions carefully:   1.  Shower with CHG Soap the night before surgery and the                                morning of Surgery.  2.  If you choose to wash your hair, wash your hair first as usual with your       normal shampoo.  3.  After you shampoo, rinse your hair and body thoroughly to remove the                      Shampoo.  4.  Use CHG as you would any other liquid soap.  You can apply chg directly       to the skin and wash gently with scrungie or a clean washcloth.  5.  Apply the CHG Soap to your body ONLY FROM THE NECK DOWN.        Do not use on open wounds or open sores.  Avoid contact with your  eyes,       ears, mouth and genitals (private parts).  Wash genitals (private parts)       with your normal soap.  6.  Wash thoroughly, paying special attention to the area where your surgery        will be performed.  7.  Thoroughly rinse your body with warm water from the neck down.  8.  DO NOT shower/wash with your normal soap after using and rinsing off       the CHG Soap.  9.  Pat yourself dry with a clean towel.            10.  Wear clean pajamas.            11.  Place clean sheets on your bed the night of your first shower and do not        sleep with pets.  Day of Surgery  Do not apply any lotions/deoderants the morning of surgery.  Please wear clean clothes to the hospital/surgery center.    Please read over the following fact sheets that you were given. Pain Booklet, Coughing and Deep Breathing, MRSA Information and Surgical Site Infection Prevention

## 2016-07-24 NOTE — Progress Notes (Signed)
NIKKI AT DR. CABBELL'S OFFICE MADE AWARE OF PATIENT BEING ON ANTIBIOTIC FOR SINUS INFECTION.  ALSO ASKED NIKKI TO CHECK ABOUT PROCEDURE- PATIENT STATES SHE THOUGHT SHE WAS HAVING A FUSION AND WAS NNOT IN ORDERS.  WILL NEED TO SIGN CONSENT DOS.   CARDIOLOGIST IS DR. Marlou Porch   PATIENT STATED SHE WAS INSTRUCTED NOT TO TAKE ANYMORE ELIQUIS AFTER 07/27/16.

## 2016-07-25 ENCOUNTER — Other Ambulatory Visit: Payer: Self-pay | Admitting: Neurosurgery

## 2016-07-25 NOTE — Progress Notes (Signed)
Anesthesia Chart Review: Patient is a 75 year old female posted for L4-5, L5-S1 laminectomy, PLIF on 07/31/16 by Dr. Christella Noa.  History includes former smoker, HTN, PAF (last episode of atrial fibrillation with RVR 07/2014), post polio syndrome, thyroidectomy, hypothyroidism, + PPD, bipolar disorder, anxiety, GERD, arthritis, hysterectomy, cervical laminectomy.   PCP is Dr. [name]. Cardiologist is Dr. Candee Furbish, last visit 06/13/16. He gave permission to hold Eliquis three days prior to surgery.  Meds includes acyclovir, Fosamax, Eliquis, Cardizem CD, Fanapt, Allegra, levothyroxine, melatonin, fish oil, Prilosec, levothyroxine, Restoril, tramadol, Crestor (weekly as tolerated; has statin intolerance). Eliquis to be held after 07/27/16 dose.   09/04/15 EKG ([facility]): NSR, ST abnormality, possible digitalis effect.  04/18/14 Echo: Study Conclusions - Left ventricle: The cavity size was normal. Wall thickness was normal. Systolic function was normal. The estimated ejection fraction was in the range of 60% to 65%. Wall motion was normal; there were no regional wall motion abnormalities. - Left atrium: The atrium was mildly dilated. - Atrial septum: No defect or patent foramen ovale was identified. - Pulmonary arteries: PA peak pressure: 32 mm Hg (S).  09/12/15 Carotid U/S: Heterogeneous plaque, bilaterally. 40-59% bilateral ICA stenosis. Normal subclavian arteries, bilaterally. Patent vertebral arteries with antegrade flow. f/u 1 year with Dopplers  Preoperative labs noted. (T&S was not done at PAT due to case not initially being posted for lumbar fusion. Nikki from Dr. Lacy Duverney office called and confirmed patient will need T&S on the day of surgery.)  She will need new consent/sign consent and T&S on the day of surgery. If no acute changes then I would anticipate that she could proceed as planned.  George Hugh [facility] Short Stay Center/Anesthesiology Phone [phone] 07/25/2016 12:22 PM

## 2016-07-30 ENCOUNTER — Encounter (HOSPITAL_COMMUNITY): Payer: Self-pay | Admitting: Anesthesiology

## 2016-07-30 NOTE — Anesthesia Preprocedure Evaluation (Addendum)
Anesthesia Evaluation  Patient identified by MRN, date of birth, ID band Patient awake    Reviewed: Allergy & Precautions, NPO status , Patient's Chart, lab work & pertinent test results  Airway Mallampati: III  TM Distance: >3 FB Neck ROM: Full    Dental  (+) Partial Upper, Partial Lower   Pulmonary former smoker,    Pulmonary exam normal breath sounds clear to auscultation       Cardiovascular hypertension, Pt. on medications Normal cardiovascular exam+ dysrhythmias Atrial Fibrillation  Rhythm:Regular Rate:Normal  Hx/o paroxysmal atrial fibrillation- currently in NSR   Neuro/Psych PSYCHIATRIC DISORDERS Anxiety Depression Bipolar Disorder Post polio syndrome  Neuromuscular disease    GI/Hepatic Neg liver ROS, GERD  Medicated and Controlled,  Endo/Other  Hypothyroidism   Renal/GU negative Renal ROS Bladder dysfunction      Musculoskeletal  (+) Arthritis , Lumbar spinal stenosis   Abdominal   Peds  Hematology Chronic anticoagulant use-Eliquis, Last dose 9/9   Anesthesia Other Findings Atrophy muscles right lower extremity.  Reproductive/Obstetrics                          Lab Results  Component Value Date   WBC 7.9 07/24/2016   HGB 12.7 07/24/2016   HCT 39.5 07/24/2016   MCV 95.0 07/24/2016   PLT 227 07/24/2016     Chemistry      Component Value Date/Time   NA 138 07/24/2016 1539   K 3.9 07/24/2016 1539   CL 105 07/24/2016 1539   CO2 27 07/24/2016 1539   BUN 21 (H) 07/24/2016 1539   CREATININE 0.94 07/24/2016 1539      Component Value Date/Time   CALCIUM 9.3 07/24/2016 1539   ALKPHOS 59 07/16/2016 0958   AST 18 07/16/2016 0958   ALT 12 07/16/2016 0958   BILITOT 0.4 07/16/2016 0958     EKG: normal EKG, normal sinus rhythm.  Anesthesia Physical Anesthesia Plan  ASA: III  Anesthesia Plan: General   Post-op Pain Management:    Induction: Intravenous  Airway Management  Planned: Oral ETT  Additional Equipment:   Intra-op Plan:   Post-operative Plan: Extubation in OR  Informed Consent: I have reviewed the patients History and Physical, chart, labs and discussed the procedure including the risks, benefits and alternatives for the proposed anesthesia with the patient or authorized representative who has indicated his/her understanding and acceptance.   Dental advisory given  Plan Discussed with: Anesthesiologist, CRNA and Surgeon  Anesthesia Plan Comments: (Will use minimal doses of muscle relaxants.)       Anesthesia Quick Evaluation

## 2016-07-31 ENCOUNTER — Encounter (HOSPITAL_COMMUNITY): Payer: Self-pay | Admitting: *Deleted

## 2016-07-31 ENCOUNTER — Ambulatory Visit (HOSPITAL_COMMUNITY): Payer: PPO

## 2016-07-31 ENCOUNTER — Encounter (HOSPITAL_COMMUNITY)
Admission: AD | Disposition: A | Discharge status: Home / Self Care | Payer: Self-pay | Source: Ambulatory Visit | Attending: Neurosurgery

## 2016-07-31 ENCOUNTER — Inpatient Hospital Stay (HOSPITAL_COMMUNITY)
Admission: AD | Admit: 2016-07-31 | Discharge: 2016-08-02 | DRG: 460 | Disposition: A | Discharge status: Home / Self Care | Payer: PPO | Source: Ambulatory Visit | Attending: Neurosurgery | Admitting: Neurosurgery

## 2016-07-31 ENCOUNTER — Ambulatory Visit (HOSPITAL_COMMUNITY): Payer: PPO | Admitting: Vascular Surgery

## 2016-07-31 DIAGNOSIS — M6281 Muscle weakness (generalized): Secondary | ICD-10-CM

## 2016-07-31 DIAGNOSIS — M4806 Spinal stenosis, lumbar region: Secondary | ICD-10-CM | POA: Diagnosis not present

## 2016-07-31 DIAGNOSIS — Z419 Encounter for procedure for purposes other than remedying health state, unspecified: Secondary | ICD-10-CM

## 2016-07-31 DIAGNOSIS — I10 Essential (primary) hypertension: Secondary | ICD-10-CM | POA: Diagnosis present

## 2016-07-31 DIAGNOSIS — K219 Gastro-esophageal reflux disease without esophagitis: Secondary | ICD-10-CM | POA: Diagnosis not present

## 2016-07-31 DIAGNOSIS — E890 Postprocedural hypothyroidism: Secondary | ICD-10-CM | POA: Diagnosis not present

## 2016-07-31 DIAGNOSIS — I480 Paroxysmal atrial fibrillation: Secondary | ICD-10-CM | POA: Diagnosis present

## 2016-07-31 DIAGNOSIS — F418 Other specified anxiety disorders: Secondary | ICD-10-CM | POA: Diagnosis not present

## 2016-07-31 DIAGNOSIS — Z961 Presence of intraocular lens: Secondary | ICD-10-CM | POA: Diagnosis not present

## 2016-07-31 DIAGNOSIS — F319 Bipolar disorder, unspecified: Secondary | ICD-10-CM | POA: Diagnosis not present

## 2016-07-31 DIAGNOSIS — M4316 Spondylolisthesis, lumbar region: Secondary | ICD-10-CM | POA: Diagnosis not present

## 2016-07-31 DIAGNOSIS — Z888 Allergy status to other drugs, medicaments and biological substances status: Secondary | ICD-10-CM | POA: Diagnosis not present

## 2016-07-31 DIAGNOSIS — Z79899 Other long term (current) drug therapy: Secondary | ICD-10-CM | POA: Diagnosis not present

## 2016-07-31 DIAGNOSIS — Z881 Allergy status to other antibiotic agents status: Secondary | ICD-10-CM

## 2016-07-31 DIAGNOSIS — M5136 Other intervertebral disc degeneration, lumbar region: Secondary | ICD-10-CM | POA: Diagnosis not present

## 2016-07-31 DIAGNOSIS — G14 Postpolio syndrome: Secondary | ICD-10-CM | POA: Diagnosis present

## 2016-07-31 DIAGNOSIS — Z87891 Personal history of nicotine dependence: Secondary | ICD-10-CM

## 2016-07-31 DIAGNOSIS — Z974 Presence of external hearing-aid: Secondary | ICD-10-CM

## 2016-07-31 DIAGNOSIS — M545 Low back pain: Secondary | ICD-10-CM | POA: Diagnosis not present

## 2016-07-31 DIAGNOSIS — M21371 Foot drop, right foot: Secondary | ICD-10-CM | POA: Diagnosis not present

## 2016-07-31 DIAGNOSIS — Z8249 Family history of ischemic heart disease and other diseases of the circulatory system: Secondary | ICD-10-CM

## 2016-07-31 DIAGNOSIS — Z981 Arthrodesis status: Secondary | ICD-10-CM | POA: Diagnosis not present

## 2016-07-31 DIAGNOSIS — H918X9 Other specified hearing loss, unspecified ear: Secondary | ICD-10-CM | POA: Diagnosis not present

## 2016-07-31 DIAGNOSIS — Z7901 Long term (current) use of anticoagulants: Secondary | ICD-10-CM | POA: Diagnosis not present

## 2016-07-31 LAB — PROTIME-INR
INR: 1.05
Prothrombin Time: 13.7 seconds (ref 11.4–15.2)

## 2016-07-31 LAB — ABO/RH: ABO/RH(D): O POS

## 2016-07-31 LAB — TYPE AND SCREEN
ABO/RH(D): O POS
Antibody Screen: NEGATIVE

## 2016-07-31 SURGERY — POSTERIOR LUMBAR FUSION 2 LEVEL
Anesthesia: General

## 2016-07-31 MED ORDER — ADULT MULTIVITAMIN W/MINERALS CH
1.0000 | ORAL_TABLET | Freq: Two times a day (BID) | ORAL | Status: DC
  Administered 2016-08-01 – 2016-08-02 (×3): 1 via ORAL
  Filled 2016-07-31 (×2): qty 1

## 2016-07-31 MED ORDER — METOCLOPRAMIDE HCL 5 MG/ML IJ SOLN
INTRAMUSCULAR | Status: AC
  Filled 2016-07-31: qty 2

## 2016-07-31 MED ORDER — ROCURONIUM BROMIDE 100 MG/10ML IV SOLN
INTRAVENOUS | Status: DC | PRN
  Administered 2016-07-31: 30 mg via INTRAVENOUS
  Administered 2016-07-31 (×3): 10 mg via INTRAVENOUS

## 2016-07-31 MED ORDER — EPHEDRINE SULFATE 50 MG/ML IJ SOLN
INTRAMUSCULAR | Status: DC | PRN
  Administered 2016-07-31 (×3): 5 mg via INTRAVENOUS

## 2016-07-31 MED ORDER — POLYETHYLENE GLYCOL 3350 17 G PO PACK
17.0000 g | PACK | Freq: Every day | ORAL | Status: DC
  Filled 2016-07-31: qty 1

## 2016-07-31 MED ORDER — PHENOL 1.4 % MT LIQD: 1.0000 | OROMUCOSAL | Status: DC | PRN

## 2016-07-31 MED ORDER — CYCLOBENZAPRINE HCL 10 MG PO TABS: 10.0000 mg | ORAL_TABLET | Freq: Three times a day (TID) | ORAL | Status: DC | PRN

## 2016-07-31 MED ORDER — ONDANSETRON HCL 4 MG/2ML IJ SOLN
INTRAMUSCULAR | Status: AC
  Filled 2016-07-31: qty 2

## 2016-07-31 MED ORDER — OXYCODONE-ACETAMINOPHEN 5-325 MG PO TABS
1.0000 | ORAL_TABLET | ORAL | Status: DC | PRN
  Administered 2016-07-31: 1 via ORAL
  Administered 2016-08-01 – 2016-08-02 (×4): 2 via ORAL
  Administered 2016-08-02 (×2): 1 via ORAL
  Filled 2016-07-31: qty 2
  Filled 2016-07-31: qty 1
  Filled 2016-07-31 (×2): qty 2
  Filled 2016-07-31: qty 1
  Filled 2016-07-31: qty 2

## 2016-07-31 MED ORDER — HYDROCODONE-ACETAMINOPHEN 5-325 MG PO TABS: 1.0000 | ORAL_TABLET | ORAL | Status: DC | PRN

## 2016-07-31 MED ORDER — SUGAMMADEX SODIUM 200 MG/2ML IV SOLN
INTRAVENOUS | Status: AC
  Filled 2016-07-31: qty 2

## 2016-07-31 MED ORDER — PROPOFOL 10 MG/ML IV BOLUS
INTRAVENOUS | Status: DC | PRN
  Administered 2016-07-31: 100 mg via INTRAVENOUS

## 2016-07-31 MED ORDER — ZOLPIDEM TARTRATE 5 MG PO TABS: 5.0000 mg | ORAL_TABLET | Freq: Every evening | ORAL | Status: DC | PRN

## 2016-07-31 MED ORDER — BUPIVACAINE HCL 0.5 % IJ SOLN
INTRAMUSCULAR | Status: DC | PRN
  Administered 2016-07-31: 30 mL

## 2016-07-31 MED ORDER — MELATONIN 3 MG PO TABS
3.0000 mg | ORAL_TABLET | Freq: Every day | ORAL | Status: DC
  Administered 2016-08-01: 3 mg via ORAL
  Filled 2016-07-31 (×3): qty 1

## 2016-07-31 MED ORDER — POTASSIUM CHLORIDE IN NACL 20-0.9 MEQ/L-% IV SOLN
INTRAVENOUS | Status: DC
  Administered 2016-07-31: 1000 mL via INTRAVENOUS
  Filled 2016-07-31: qty 1000

## 2016-07-31 MED ORDER — LEVOTHYROXINE SODIUM 100 MCG PO TABS
100.0000 ug | ORAL_TABLET | Freq: Every day | ORAL | Status: DC
  Administered 2016-08-01 – 2016-08-02 (×2): 100 ug via ORAL
  Filled 2016-07-31 (×2): qty 1

## 2016-07-31 MED ORDER — SODIUM CHLORIDE 0.9 % IV SOLN
250.0000 mL | INTRAVENOUS | Status: DC
Start: 2016-07-31 — End: 2016-08-02

## 2016-07-31 MED ORDER — ACETAMINOPHEN 325 MG PO TABS: 650.0000 mg | ORAL_TABLET | ORAL | Status: DC | PRN

## 2016-07-31 MED ORDER — SODIUM CHLORIDE 0.9% FLUSH
3.0000 mL | Freq: Two times a day (BID) | INTRAVENOUS | Status: DC
  Administered 2016-08-02: 3 mL via INTRAVENOUS

## 2016-07-31 MED ORDER — CEFAZOLIN SODIUM-DEXTROSE 2-4 GM/100ML-% IV SOLN
2.0000 g | INTRAVENOUS | Status: AC
  Administered 2016-07-31 (×2): 2 g via INTRAVENOUS
  Filled 2016-07-31: qty 100

## 2016-07-31 MED ORDER — CHLORHEXIDINE GLUCONATE CLOTH 2 % EX PADS: 6.0000 | MEDICATED_PAD | Freq: Once | CUTANEOUS | Status: DC

## 2016-07-31 MED ORDER — OXYBUTYNIN CHLORIDE ER 10 MG PO TB24
10.0000 mg | ORAL_TABLET | Freq: Two times a day (BID) | ORAL | Status: DC
  Administered 2016-08-01 – 2016-08-02 (×3): 10 mg via ORAL
  Filled 2016-07-31 (×5): qty 1

## 2016-07-31 MED ORDER — SENNA 8.6 MG PO TABS
1.0000 | ORAL_TABLET | Freq: Two times a day (BID) | ORAL | Status: DC
  Administered 2016-08-01 – 2016-08-02 (×2): 8.6 mg via ORAL
  Filled 2016-07-31 (×2): qty 1

## 2016-07-31 MED ORDER — BISACODYL 5 MG PO TBEC
5.0000 mg | DELAYED_RELEASE_TABLET | Freq: Every day | ORAL | Status: DC | PRN
  Filled 2016-07-31: qty 1

## 2016-07-31 MED ORDER — ACYCLOVIR 800 MG PO TABS
400.0000 mg | ORAL_TABLET | Freq: Every day | ORAL | Status: DC
  Filled 2016-07-31 (×2): qty 1

## 2016-07-31 MED ORDER — DILTIAZEM HCL ER COATED BEADS 180 MG PO CP24
180.0000 mg | ORAL_CAPSULE | Freq: Every day | ORAL | Status: DC
  Administered 2016-08-01 – 2016-08-02 (×2): 180 mg via ORAL
  Filled 2016-07-31 (×2): qty 1

## 2016-07-31 MED ORDER — SURGIFOAM 100 EX MISC
CUTANEOUS | Status: DC | PRN
  Administered 2016-07-31: 10:00:00 via TOPICAL

## 2016-07-31 MED ORDER — ACETAMINOPHEN 10 MG/ML IV SOLN
INTRAVENOUS | Status: DC | PRN
  Administered 2016-07-31: 1000 mg via INTRAVENOUS

## 2016-07-31 MED ORDER — MAGNESIUM CITRATE PO SOLN: 1.0000 | Freq: Once | ORAL | Status: DC | PRN

## 2016-07-31 MED ORDER — MIDAZOLAM HCL 2 MG/2ML IJ SOLN
INTRAMUSCULAR | Status: AC
  Filled 2016-07-31: qty 2

## 2016-07-31 MED ORDER — SUCCINYLCHOLINE CHLORIDE 200 MG/10ML IV SOSY
PREFILLED_SYRINGE | INTRAVENOUS | Status: AC
  Filled 2016-07-31: qty 10

## 2016-07-31 MED ORDER — PHENYLEPHRINE HCL 10 MG/ML IJ SOLN
INTRAVENOUS | Status: DC | PRN
  Administered 2016-07-31: 10 ug/min via INTRAVENOUS

## 2016-07-31 MED ORDER — PANTOPRAZOLE SODIUM 40 MG PO TBEC
40.0000 mg | DELAYED_RELEASE_TABLET | Freq: Every day | ORAL | Status: DC
  Administered 2016-08-01: 40 mg via ORAL
  Filled 2016-07-31 (×2): qty 1

## 2016-07-31 MED ORDER — MENTHOL 3 MG MT LOZG: 1.0000 | LOZENGE | OROMUCOSAL | Status: DC | PRN

## 2016-07-31 MED ORDER — ROSUVASTATIN CALCIUM 10 MG PO TABS
10.0000 mg | ORAL_TABLET | Freq: Every day | ORAL | Status: DC
  Filled 2016-07-31 (×2): qty 1

## 2016-07-31 MED ORDER — MIDAZOLAM HCL 5 MG/5ML IJ SOLN
INTRAMUSCULAR | Status: DC | PRN
  Administered 2016-07-31 (×2): 0.5 mg via INTRAVENOUS

## 2016-07-31 MED ORDER — 0.9 % SODIUM CHLORIDE (POUR BTL) OPTIME
TOPICAL | Status: DC | PRN
  Administered 2016-07-31: 1000 mL

## 2016-07-31 MED ORDER — PHENYLEPHRINE 40 MCG/ML (10ML) SYRINGE FOR IV PUSH (FOR BLOOD PRESSURE SUPPORT)
PREFILLED_SYRINGE | INTRAVENOUS | Status: AC
  Filled 2016-07-31: qty 10

## 2016-07-31 MED ORDER — LIDOCAINE HCL (CARDIAC) 20 MG/ML IV SOLN
INTRAVENOUS | Status: DC | PRN
  Administered 2016-07-31: 60 mg via INTRAVENOUS

## 2016-07-31 MED ORDER — VITAMIN C 500 MG PO TABS
1000.0000 mg | ORAL_TABLET | Freq: Every day | ORAL | Status: DC
  Administered 2016-08-01 – 2016-08-02 (×2): 1000 mg via ORAL
  Filled 2016-07-31 (×2): qty 2

## 2016-07-31 MED ORDER — HYDROMORPHONE HCL 1 MG/ML IJ SOLN
INTRAMUSCULAR | Status: AC
  Filled 2016-07-31: qty 1

## 2016-07-31 MED ORDER — SUGAMMADEX SODIUM 200 MG/2ML IV SOLN
INTRAVENOUS | Status: DC | PRN
  Administered 2016-07-31: 150 mg via INTRAVENOUS

## 2016-07-31 MED ORDER — ONDANSETRON HCL 4 MG/2ML IJ SOLN
INTRAMUSCULAR | Status: DC | PRN
  Administered 2016-07-31: 4 mg via INTRAVENOUS

## 2016-07-31 MED ORDER — AMOXICILLIN-POT CLAVULANATE 875-125 MG PO TABS
1.0000 | ORAL_TABLET | Freq: Two times a day (BID) | ORAL | Status: DC
  Administered 2016-08-01 – 2016-08-02 (×3): 1 via ORAL
  Filled 2016-07-31 (×3): qty 1

## 2016-07-31 MED ORDER — ONDANSETRON HCL 4 MG/2ML IJ SOLN
4.0000 mg | INTRAMUSCULAR | Status: DC | PRN
  Administered 2016-08-01: 4 mg via INTRAVENOUS
  Filled 2016-07-31: qty 2

## 2016-07-31 MED ORDER — ALUM & MAG HYDROXIDE-SIMETH 200-200-20 MG/5ML PO SUSP
30.0000 mL | Freq: Four times a day (QID) | ORAL | Status: DC | PRN
  Administered 2016-07-31 – 2016-08-01 (×4): 30 mL via ORAL
  Filled 2016-07-31 (×6): qty 30

## 2016-07-31 MED ORDER — METOCLOPRAMIDE HCL 5 MG/ML IJ SOLN
10.0000 mg | Freq: Once | INTRAMUSCULAR | Status: AC | PRN
  Administered 2016-07-31: 10 mg via INTRAVENOUS

## 2016-07-31 MED ORDER — LIDOCAINE-EPINEPHRINE 0.5 %-1:200000 IJ SOLN
INTRAMUSCULAR | Status: DC | PRN
  Administered 2016-07-31: 10 mL

## 2016-07-31 MED ORDER — ILOPERIDONE 2 MG PO TABS
3.0000 mg | ORAL_TABLET | Freq: Every day | ORAL | Status: DC
  Filled 2016-07-31 (×3): qty 2

## 2016-07-31 MED ORDER — LIDOCAINE 2% (20 MG/ML) 5 ML SYRINGE
INTRAMUSCULAR | Status: AC
  Filled 2016-07-31: qty 5

## 2016-07-31 MED ORDER — TEMAZEPAM 15 MG PO CAPS
15.0000 mg | ORAL_CAPSULE | Freq: Every evening | ORAL | Status: DC | PRN
  Administered 2016-08-01: 15 mg via ORAL
  Filled 2016-07-31: qty 1

## 2016-07-31 MED ORDER — SODIUM CHLORIDE 0.9% FLUSH: 3.0000 mL | INTRAVENOUS | Status: DC | PRN

## 2016-07-31 MED ORDER — SUFENTANIL CITRATE 50 MCG/ML IV SOLN
INTRAVENOUS | Status: AC
  Filled 2016-07-31: qty 1

## 2016-07-31 MED ORDER — EPHEDRINE 5 MG/ML INJ
INTRAVENOUS | Status: AC
  Filled 2016-07-31: qty 10

## 2016-07-31 MED ORDER — MEPERIDINE HCL 25 MG/ML IJ SOLN: 6.2500 mg | INTRAMUSCULAR | Status: DC | PRN

## 2016-07-31 MED ORDER — LORATADINE 10 MG PO TABS
10.0000 mg | ORAL_TABLET | Freq: Every day | ORAL | Status: DC
  Administered 2016-08-01 – 2016-08-02 (×2): 10 mg via ORAL
  Filled 2016-07-31 (×2): qty 1

## 2016-07-31 MED ORDER — PROPOFOL 10 MG/ML IV BOLUS
INTRAVENOUS | Status: AC
  Filled 2016-07-31: qty 20

## 2016-07-31 MED ORDER — DEXAMETHASONE SODIUM PHOSPHATE 10 MG/ML IJ SOLN
INTRAMUSCULAR | Status: AC
  Filled 2016-07-31: qty 1

## 2016-07-31 MED ORDER — ARTIFICIAL TEARS OP OINT
TOPICAL_OINTMENT | OPHTHALMIC | Status: DC | PRN
  Administered 2016-07-31: 1 via OPHTHALMIC

## 2016-07-31 MED ORDER — FENTANYL CITRATE (PF) 100 MCG/2ML IJ SOLN
INTRAMUSCULAR | Status: AC
  Filled 2016-07-31: qty 2

## 2016-07-31 MED ORDER — OXYCODONE-ACETAMINOPHEN 5-325 MG PO TABS
ORAL_TABLET | ORAL | Status: AC
  Filled 2016-07-31: qty 1

## 2016-07-31 MED ORDER — ACETAMINOPHEN 10 MG/ML IV SOLN
INTRAVENOUS | Status: AC
  Filled 2016-07-31: qty 100

## 2016-07-31 MED ORDER — SUFENTANIL CITRATE 50 MCG/ML IV SOLN
INTRAVENOUS | Status: DC | PRN
  Administered 2016-07-31 (×2): 5 ug via INTRAVENOUS
  Administered 2016-07-31: 15 ug via INTRAVENOUS
  Administered 2016-07-31 (×5): 5 ug via INTRAVENOUS

## 2016-07-31 MED ORDER — LACTATED RINGERS IV SOLN
INTRAVENOUS | Status: DC
  Administered 2016-07-31 (×3): via INTRAVENOUS

## 2016-07-31 MED ORDER — ARTIFICIAL TEARS OP OINT
TOPICAL_OINTMENT | OPHTHALMIC | Status: AC
  Filled 2016-07-31: qty 3.5

## 2016-07-31 MED ORDER — ALBUMIN HUMAN 5 % IV SOLN
INTRAVENOUS | Status: DC | PRN
  Administered 2016-07-31: 13:00:00 via INTRAVENOUS

## 2016-07-31 MED ORDER — SODIUM CHLORIDE 0.9 % IJ SOLN
INTRAMUSCULAR | Status: AC
  Filled 2016-07-31: qty 10

## 2016-07-31 MED ORDER — ACETAMINOPHEN 650 MG RE SUPP: 650.0000 mg | RECTAL | Status: DC | PRN

## 2016-07-31 MED ORDER — ROCURONIUM BROMIDE 10 MG/ML (PF) SYRINGE
PREFILLED_SYRINGE | INTRAVENOUS | Status: AC
  Filled 2016-07-31: qty 10

## 2016-07-31 MED ORDER — MORPHINE SULFATE (PF) 2 MG/ML IV SOLN
1.0000 mg | INTRAVENOUS | Status: DC | PRN
  Administered 2016-07-31: 2 mg via INTRAVENOUS
  Filled 2016-07-31: qty 1

## 2016-07-31 MED ORDER — CALCIUM CARBONATE-VITAMIN D 500-200 MG-UNIT PO TABS
2.0000 | ORAL_TABLET | Freq: Every day | ORAL | Status: DC
  Administered 2016-08-01: 2 via ORAL
  Filled 2016-07-31: qty 2

## 2016-07-31 MED ORDER — HYDROMORPHONE HCL 1 MG/ML IJ SOLN
0.2500 mg | INTRAMUSCULAR | Status: DC | PRN
  Administered 2016-07-31: 1 mg via INTRAVENOUS

## 2016-07-31 SURGICAL SUPPLY — 69 items
ADH SKN CLS APL DERMABOND .7 (GAUZE/BANDAGES/DRESSINGS) ×1
APL SKNCLS STERI-STRIP NONHPOA (GAUZE/BANDAGES/DRESSINGS)
BAG DECANTER FOR FLEXI CONT (MISCELLANEOUS) ×2 IMPLANT
BENZOIN TINCTURE PRP APPL 2/3 (GAUZE/BANDAGES/DRESSINGS) IMPLANT
BIT DRILL PLIF MAS 5.0MM DISP (DRILL) IMPLANT
BLADE CLIPPER SURG (BLADE) IMPLANT
BUR MATCHSTICK NEURO 3.0 LAGG (BURR) ×2 IMPLANT
BUR PRECISION FLUTE 5.0 (BURR) ×2 IMPLANT
CANISTER SUCT 3000ML PPV (MISCELLANEOUS) ×2 IMPLANT
CAP RELINE MOD TULIP RMM (Cap) ×4 IMPLANT
CONT SPEC 4OZ CLIKSEAL STRL BL (MISCELLANEOUS) ×2 IMPLANT
COVER BACK TABLE 60X90IN (DRAPES) IMPLANT
DECANTER SPIKE VIAL GLASS SM (MISCELLANEOUS) ×2 IMPLANT
DERMABOND ADVANCED (GAUZE/BANDAGES/DRESSINGS) ×1
DERMABOND ADVANCED .7 DNX12 (GAUZE/BANDAGES/DRESSINGS) ×1 IMPLANT
DRAPE C-ARM 42X72 X-RAY (DRAPES) ×4 IMPLANT
DRAPE C-ARMOR (DRAPES) ×1 IMPLANT
DRAPE LAPAROTOMY 100X72X124 (DRAPES) ×2 IMPLANT
DRAPE POUCH INSTRU U-SHP 10X18 (DRAPES) ×2 IMPLANT
DRAPE SURG 17X23 STRL (DRAPES) ×2 IMPLANT
DRILL PLIF MAS 5.0MM DISP (DRILL) ×2
DRSG OPSITE POSTOP 4X8 (GAUZE/BANDAGES/DRESSINGS) ×1 IMPLANT
DURAPREP 26ML APPLICATOR (WOUND CARE) ×2 IMPLANT
ELECT REM PT RETURN 9FT ADLT (ELECTROSURGICAL) ×2
ELECTRODE REM PT RTRN 9FT ADLT (ELECTROSURGICAL) ×1 IMPLANT
GAUZE SPONGE 4X4 12PLY STRL (GAUZE/BANDAGES/DRESSINGS) IMPLANT
GAUZE SPONGE 4X4 16PLY XRAY LF (GAUZE/BANDAGES/DRESSINGS) IMPLANT
GLOVE ECLIPSE 6.5 STRL STRAW (GLOVE) ×4 IMPLANT
GLOVE EXAM NITRILE LRG STRL (GLOVE) IMPLANT
GLOVE EXAM NITRILE XL STR (GLOVE) IMPLANT
GLOVE EXAM NITRILE XS STR PU (GLOVE) IMPLANT
GLOVE INDICATOR 7.0 STRL GRN (GLOVE) ×2 IMPLANT
GLOVE INDICATOR 7.5 STRL GRN (GLOVE) ×2 IMPLANT
GLOVE SS BIOGEL STRL SZ 7 (GLOVE) IMPLANT
GLOVE SUPERSENSE BIOGEL SZ 7 (GLOVE) ×2
GOWN STRL REUS W/ TWL LRG LVL3 (GOWN DISPOSABLE) ×2 IMPLANT
GOWN STRL REUS W/ TWL XL LVL3 (GOWN DISPOSABLE) IMPLANT
GOWN STRL REUS W/TWL 2XL LVL3 (GOWN DISPOSABLE) IMPLANT
GOWN STRL REUS W/TWL LRG LVL3 (GOWN DISPOSABLE) ×6
GOWN STRL REUS W/TWL XL LVL3 (GOWN DISPOSABLE) ×2
KIT BASIN OR (CUSTOM PROCEDURE TRAY) ×2 IMPLANT
KIT POSITION SURG JACKSON T1 (MISCELLANEOUS) ×2 IMPLANT
KIT ROOM TURNOVER OR (KITS) ×2 IMPLANT
MILL MEDIUM DISP (BLADE) ×1 IMPLANT
NDL HYPO 25X1 1.5 SAFETY (NEEDLE) ×1 IMPLANT
NDL SPNL 18GX3.5 QUINCKE PK (NEEDLE) IMPLANT
NEEDLE HYPO 25X1 1.5 SAFETY (NEEDLE) ×2 IMPLANT
NEEDLE SPNL 18GX3.5 QUINCKE PK (NEEDLE) IMPLANT
NS IRRIG 1000ML POUR BTL (IV SOLUTION) ×2 IMPLANT
PACK LAMINECTOMY NEURO (CUSTOM PROCEDURE TRAY) ×2 IMPLANT
PAD ARMBOARD 7.5X6 YLW CONV (MISCELLANEOUS) ×6 IMPLANT
ROD RELINE COCR 5.0X60MM (Rod) ×1 IMPLANT
ROD RELINE-O COCR 5.0X55MM (Rod) ×1 IMPLANT
SCREW LOCK RSS 4.5/5.0MM (Screw) ×6 IMPLANT
SCREW RELINE RMM 5.5X35 4S (Screw) ×2 IMPLANT
SCREW SHANK RELINE MOD 5.0X35 (Screw) ×3 IMPLANT
SCREW SHANK RELINE MOD 5.0X40 (Screw) ×1 IMPLANT
SPACER OPAL REVOLVE 10MM ×2 IMPLANT
SPONGE LAP 4X18 X RAY DECT (DISPOSABLE) IMPLANT
SPONGE SURGIFOAM ABS GEL 100 (HEMOSTASIS) ×2 IMPLANT
STRIP CLOSURE SKIN 1/2X4 (GAUZE/BANDAGES/DRESSINGS) IMPLANT
SUT PROLENE 6 0 BV (SUTURE) IMPLANT
SUT VIC AB 0 CT1 18XCR BRD8 (SUTURE) ×1 IMPLANT
SUT VIC AB 0 CT1 8-18 (SUTURE) ×6
SUT VIC AB 2-0 CT1 18 (SUTURE) ×3 IMPLANT
SUT VIC AB 3-0 SH 8-18 (SUTURE) ×2 IMPLANT
TOWEL OR 17X24 6PK STRL BLUE (TOWEL DISPOSABLE) ×2 IMPLANT
TOWEL OR 17X26 10 PK STRL BLUE (TOWEL DISPOSABLE) ×2 IMPLANT
WATER STERILE IRR 1000ML POUR (IV SOLUTION) ×2 IMPLANT

## 2016-07-31 NOTE — Op Note (Signed)
07/31/2016  2:14 PM  PATIENT:  Kaitlyn Hoover  75 y.o. 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   PROCEDURE:  Procedure(s): Lumbar four-five posterior lumbar interbody arthrodesis (Synthes opal)13mm x32mm x2cages, autograft morsels Lumbar laminectomy L4,5,hemilaminectomy S1 for decompression of the L4, L5, and S1 nerve roots, beyond the needed exposure for a PLIF Posterolateral arthrodesis L4/5,5/S1 autograft morsels Segmental pedicle screw(nuvasive) fixation L4-S1 SURGEON:  Surgeon(s): Ashok Pall, MD Newman Pies, MD  ASSISTANTS:Jenkins, Dellis Filbert  ANESTHESIA:   general  EBL:  Total I/O In: 1250 [I.V.:1000; IV Piggyback:250] Out: 860 [Urine:755; Blood:105]  BLOOD ADMINISTERED:none  CELL SAVER GIVEN:none  COUNT:per nursing  DRAINS: none   SPECIMEN:  No Specimen  DICTATION: Kaitlyn Hoover is a 76 y.o. female whom was taken to the operating room intubated, and placed under a general anesthetic without difficulty. A foley catheter was placed under sterile conditions. She was positioned prone on a Jackson stable with all pressure points properly padded.  Her lumbar region was prepped and draped in a sterile manner. I infiltrated 10cc's 1/2%lidocaine/1:2000,000 strength epinephrine into the planned incision. I opened the skin with a 10 blade and took the incision down to the thoracolumbar fascia. I exposed the lamina of L3,4,5,and S1 in a subperiosteal fashion bilaterally. I confirmed my location with an intraoperative xray.  I placed self retaining retractors and started the decompression.  I decompressed the spinal canal at L4/5, and L5/S1 via complete L4, and L5 laminectomies  and inferior L4, and L5 facetectomies. I used the Leksell rongeur, drill, and Kerrison punches to remove the bone and to decompress the neural elements. The lateral recesses were decompressed and the nerve roots L4,5,and S1 had free egress out of the canal. I removed more bone than was necessary to perform a PLIF at L4/5. PLIF's were performed at L4/5 in the same fashion. I opened the disc space with a 15 blade then used a variety of instruments to remove the disc and prepare the space for the arthrodesis. I used curettes, rongeurs, punches, shavers for the disc space, and rasps in the discetomy. I measured the disc space and placed 10 x 72mm  Peek cages(Synthes Opal) into the disc space(s).  We decorticated the lateral bone and transverse processes at L4,5,and S1. I then placed autograft morsels on the decorticated surfaces to complete the posterolateral arthrodesis.  I placed pedicle screws at L4,5, and S1, using fluoroscopic guidance. I drilled a pilot hole, then cannulated the pedicle with a drill at each site. I then tapped each pedicle, assessing each site for pedicle violations. No cutouts were appreciated. Screws (Nuvasive plif) were then placed at each site without difficulty. Dr. Arnoldo Morale and I placed the tulip heads, then rods, and finally locking caps. The locking caps were secured with the torque screwdriver.  Final films were performed and all screws appeared to be in good position.  We closed the wound in a layered fashion. We approximated the thoracolumbar fascia, subcutaneous, and subcuticular planes with vicryl sutures. I used dermabond, and an occlusive bandage for a sterile dressing.     PLAN OF CARE: Admit to inpatient   PATIENT DISPOSITION:  PACU - hemodynamically stable.   Delay start of Pharmacological VTE agent (>24hrs) due to surgical blood loss or risk of bleeding:  yes

## 2016-07-31 NOTE — Progress Notes (Signed)
Received from PACU. Oreinted to room, safety precautions & plan of care.  Nausea continues with movement, tolerates sips.

## 2016-07-31 NOTE — Anesthesia Postprocedure Evaluation (Signed)
Anesthesia Post Note  Patient: Kaitlyn Hoover  Procedure(s) Performed: Procedure(s) (LRB): Lumbar four-five ,five-sacal-one Lumbar Fusion and lumbar four-five lumbar five sacral one laminectomy (N/A)  Patient location during evaluation: PACU Anesthesia Type: General Level of consciousness: awake and alert and oriented Pain management: pain level controlled Vital Signs Assessment: post-procedure vital signs reviewed and stable Respiratory status: spontaneous breathing, nonlabored ventilation, respiratory function stable and patient connected to nasal cannula oxygen Cardiovascular status: blood pressure returned to baseline and stable Postop Assessment: no signs of nausea or vomiting Anesthetic complications: no    Last Vitals:  Vitals:   07/31/16 1457 07/31/16 1500  BP: (!) 127/52   Pulse:  85  Resp: 12 10  Temp:      Last Pain:  Vitals:   07/31/16 1437  TempSrc:   PainSc: 5                  Adelia Baptista A.

## 2016-07-31 NOTE — Transfer of Care (Signed)
Immediate Anesthesia Transfer of Care Note  Patient: Kaitlyn Hoover  Procedure(s) Performed: Procedure(s): Lumbar four-five ,five-sacal-one Lumbar Fusion and lumbar four-five lumbar five sacral one laminectomy (N/A)  Patient Location: PACU  Anesthesia Type:General  Level of Consciousness: awake, alert , oriented and patient cooperative  Airway & Oxygen Therapy: Patient Spontanous Breathing and Patient connected to nasal cannula oxygen  Post-op Assessment: Report given to RN, Post -op Vital signs reviewed and stable, Patient moving all extremities and Patient moving all extremities X 4  Post vital signs: Reviewed and stable  Last Vitals:  Vitals:   07/31/16 0717  BP: (!) 171/54  Pulse: 64  Resp: 20  Temp: 36.6 C    Last Pain:  Vitals:   07/31/16 0815  TempSrc:   PainSc: 5       Patients Stated Pain Goal: 3 ([id number])  Complications: No apparent anesthesia complications

## 2016-07-31 NOTE — Anesthesia Procedure Notes (Signed)
Procedure Name: Intubation Date/Time: 07/31/2016 9:21 AM Performed by: Izora Gala Pre-anesthesia Checklist: Patient identified, Emergency Drugs available, Suction available and Patient being monitored Patient Re-evaluated:Patient Re-evaluated prior to inductionOxygen Delivery Method: Circle system utilized Preoxygenation: Pre-oxygenation with 100% oxygen Intubation Type: IV induction Ventilation: Mask ventilation without difficulty Laryngoscope Size: Miller and 3 Grade View: Grade I Tube type: Oral Tube size: 7.0 mm Number of attempts: 1 Airway Equipment and Method: Stylet and Bite block Placement Confirmation: ETT inserted through vocal cords under direct vision,  positive ETCO2 and breath sounds checked- equal and bilateral Secured at: 21 cm Tube secured with: Tape Dental Injury: Teeth and Oropharynx as per pre-operative assessment

## 2016-07-31 NOTE — H&P (Signed)
BP (!) 171/54   Pulse 64   Temp 97.8 F (36.6 C) (Oral)   Resp 20   Wt 58.5 kg (129 lb)   SpO2 99%   BMI 23.59 kg/m  Ms. Deltoro is a 75 year old who presents today for evaluation of pain that she has in the back and both lower extremities. She says she has problems since 1998. 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 She says that she is not sure how much of this is post polio and how much may be the spinal stenosis. 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  DATA: MRI of the lumbar spine is reviewed. It shows profound stenosis at L4-5 and facet arthropathy, lateral recess stenosis, and at L5-S1 lateral recess stenosis, ligamentous hypertrophy, stenosis, and not as bad as 4-5 but both are fairly bad. Conus medullaris is normal. Cauda equina is normal.  PAST MEDICAL HISTORY: Past medical history is significant for gastrointestinal problems, atrial fibrillation and the polio. She has bladder incontinence.  PAST SURGICAL HISTORY: She has had surgery for the polio, Cesarean sections, and a cervical laminectomy. INTERVAL PFSH: Mother and father are both deceased. Hypertension and cancer present in the family history. She does not smoke. She does drink alcohol daily. She does not have a history of substance abuse. She has gained 10 pounds in the last year.  ALLERGIES: CIPROFLOXACIN GIVES HER A RASH. SULFA DRUGS UNKNOWN WHAT HER REACTION IS BUT SHE BELIEVES SHE IS ALLERGIC TO IT. MEDICATIONS: She currently takes Atenolol, Cardia, Eliquis, Ziprasidone, Temazepam, Acyclovir, Levothyroxine, Oxybutynin, Fosamax, Allegra, Flonase, Noritote 1%, Tylenol, and Ketoprofen Lidocaine Gel. REVIEW OF SYSTEMS: Review of systems is positive for eyeglasses, cataracts, she has implants. Hearing aid, hearing loss, tinnitus. Sinus problems. Irregular pulse. Leg weakness post polio, right foot drop. She has rosacea, bipolar disorder, bleeding tendencies because she is on Eliquis,  and she has had a blood transfusion. On the pain chart, she lists pain in the lower extremities. Walking makes the pain worse.  PHYSICAL EXAMINATION: Vital signs: Height 5 feet 1.5 inches, weight 136.8 pounds. Blood pressure I s148/52. Pulse is 56. Temperature is 99.5. Respiratory rate is 12. Pain is 5/10.  On exam, she is alert, oriented x4 and answering all questions appropriately. Memory, language, attention span, and fund of knowledge are normal. Pupils equal, round, and reactive to light. Full extraocular movements. Full visual fields. Hearing is intact to voice with her appliances. Uvula elevates in the midline. Shoulder shrug is normal. Tongue protrudes in the midline. Symmetric facial movements and sensation. 5/5 strength in the upper and lower extremities with the exception of a dropped right foot. She has sightly abnormal gait, but I am assuming this is to accommodate the dropped right foot. Reflexes are trace if present at all at the knees and ankles. She has 1+ in the upper extremities. Proprioception is intact.  IMPRESSION/PLAN: Ms. 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 She has stenosis at 4-5, 5-1. I think operative decompression would be beneficial, but only if she is in enough pain that that makes sense to her. I also believe that she could try injections with very little risk and while they may not be helpful it may be something to do if you just have to know that you did everything before you submitted to surgery. I do think the surgery would involve an arthrodesis and pedicle screws, as she has such significant facet arthropathy. I gave her a  detailed sheet with regard to that operation, a lumbar fusion, and she will contact us.

## 2016-07-31 NOTE — Progress Notes (Signed)
Off monitor pending room assignment/stable/comfortable

## 2016-07-31 NOTE — Progress Notes (Deleted)
NO Rx for BP per Dr Royce Macadamia

## 2016-08-01 MED ORDER — MANAGING BACK PAIN BOOK
Freq: Once | Status: AC
  Administered 2016-08-01: 08:00:00
  Filled 2016-08-01: qty 1

## 2016-08-01 MED FILL — Heparin Sodium (Porcine) Inj 1000 Unit/ML: INTRAMUSCULAR | Qty: 30 | Status: AC

## 2016-08-01 MED FILL — Sodium Chloride IV Soln 0.9%: INTRAVENOUS | Qty: 1000 | Status: AC

## 2016-08-01 NOTE — Progress Notes (Signed)
Orthopedic Tech Progress Note Patient Details:  Kaitlyn Hoover 1941/03/06 [id number]  Patient ID: [name], female   DOB: [birth year], 75 y.o.   MRN: [id number]   [name] 08/01/2016, 10:33 AMCalled [employer] for Lumbar brace.

## 2016-08-01 NOTE — Evaluation (Signed)
Physical Therapy Evaluation Patient Details Name: Kaitlyn Hoover MRN: [id number] DOB: 03-27-1941 Today's Date: 08/01/2016   History of Present Illness  Patient is a 75 y/o female with hx of post polio, A-fib, bipolar disorder, HTN and TB presents s/p L4-5, L5-S1 PLIF.  Clinical Impression  Patient presents with pain, weakness and post surgical deficits s/p above surgery. Tolerated transfers and gait training with Min guard-Min A for balance/safety. Pt impulsive with mobility. Education re: back precautions, brace, positioning, functional activities etc. Discussed hiring outside help to assist with dog walking, IADLs due to pt's new precautions. Pt will have son and brother's support at d/c. Will plan for stair training tomorrow as tolerated. Will follow acutely to maximize independence and mobility prior to return home.    Follow Up Recommendations Supervision - Intermittent;Home health PT    Equipment Recommendations  Rolling walker with 5" wheels    Recommendations for Other Services OT consult     Precautions / Restrictions Precautions Precautions: Back Precaution Booklet Issued: No Precaution Comments: Reviewed back precautions Required Braces or Orthoses: Spinal Brace Spinal Brace: Lumbar corset Restrictions Weight Bearing Restrictions: No      Mobility  Bed Mobility Overal bed mobility: Needs Assistance Bed Mobility: Rolling;Sidelying to Sit Rolling: Min guard Sidelying to sit: Min assist       General bed mobility comments: HOB flat, use of rails for support. Cues for log roll technique. Increased time. Assist to elevate trunk.  Transfers Overall transfer level: Needs assistance Equipment used: None Transfers: Sit to/from Stand Sit to Stand: Min guard;Min assist         General transfer comment: Min guard for safety, Min A to stand from low toilet and grab bar. Transferred to chair post ambulation bout.  Ambulation/Gait Ambulation/Gait assistance: Min  guard Ambulation Distance (Feet): 150 Feet Assistive device: Rolling walker (2 wheeled) Gait Pattern/deviations: Step-through pattern;Decreased stride length;Steppage Gait velocity: decreased Gait velocity interpretation: <1.8 ft/sec, indicative of risk for recurrent falls General Gait Details: Slow, mostly steady gait but reaching for furniture in room for support; balance improved with use of RW. Cues to adhere to back precautions during mobility.  Stairs            Wheelchair Mobility    Modified Rankin (Stroke Patients Only)       Balance Overall balance assessment: Needs assistance Sitting-balance support: Feet supported;No upper extremity supported Sitting balance-Leahy Scale: Good Sitting balance - Comments: Able to perform pericare without assist, cues to avoid twisting.   Standing balance support: During functional activity Standing balance-Leahy Scale: Fair Standing balance comment: Reaching for furniture for support, balance improved with use of RW for ambulation.                              Pertinent Vitals/Pain Pain Assessment: 0-10 Pain Score: 5  Pain Location: back at incision Pain Descriptors / Indicators: Sore;Operative site guarding Pain Intervention(s): Monitored during session;Repositioned;Patient requesting pain meds-RN notified;Limited activity within patient's tolerance    Home Living Family/patient expects to be discharged to:: Private residence Living Arrangements: Other relatives (brother and son) Available Help at Discharge: Family;Available 24 hours/day Type of Home: House Home Access: Stairs to enter Entrance Stairs-Rails: Left;Can reach both Entrance Stairs-Number of Steps: 6+6 to side door to kitchen or 3 STE to front door Home Layout: One level Home Equipment: Shower seat;Grab bars - tub/shower      Prior Function Level of Independence: Independent  Comments: Pt drives, rides buggys and scooters in grocery  store. Has a brace for RLE but does not wear it due to hx of polio.      Hand Dominance        Extremity/Trunk Assessment   Upper Extremity Assessment: Defer to OT evaluation           Lower Extremity Assessment: Generalized weakness (Premorbid weakness RLE due to hx of polio; decreased DF.)      Cervical / Trunk Assessment: Other exceptions  Communication   Communication: No difficulties  Cognition Arousal/Alertness: Awake/alert Behavior During Therapy: WFL for tasks assessed/performed Overall Cognitive Status: Within Functional Limits for tasks assessed (concern for safety awareness.)                      General Comments General comments (skin integrity, edema, etc.): Brother persent in room during session.    Exercises        Assessment/Plan    PT Assessment Patient needs continued PT services  PT Diagnosis Difficulty walking;Acute pain   PT Problem List Decreased strength;Decreased balance;Pain;Decreased knowledge of use of DME;Decreased activity tolerance;Decreased mobility;Decreased knowledge of precautions  PT Treatment Interventions Therapeutic activities;Gait training;Therapeutic exercise;Patient/family education;Stair training;Functional mobility training;Balance training;DME instruction   PT Goals (Current goals can be found in the Care Plan section) Acute Rehab PT Goals Patient Stated Goal: to be able to take my dog for a walk PT Goal Formulation: With patient Time For Goal Achievement: 08/15/16 Potential to Achieve Goals: Good    Frequency Min 5X/week   Barriers to discharge Inaccessible home environment stairs to enter home    Co-evaluation               End of Session Equipment Utilized During Treatment: Gait belt;Back brace Activity Tolerance: Patient tolerated treatment well Patient left: in chair;with call bell/phone within reach Nurse Communication: Mobility status         Time: [id number] PT Time Calculation (min)  (ACUTE ONLY): 32 min   Charges:   PT Evaluation $PT Eval Moderate Complexity: 1 Procedure PT Treatments $Gait Training: 8-22 mins   PT G Codes:        Maniah Nading A Dahmir Epperly 08/01/2016, 11:57 AM Wray Kearns, Patrick, DPT [phone]

## 2016-08-01 NOTE — Progress Notes (Signed)
.  Patient ID: Kaitlyn Hoover, female   DOB: 05/04/1941, 75 y.o.   MRN: [id number] BP (!) 149/61 (BP Location: Right Arm)   Pulse 94   Temp 99.6 F (37.6 C) (Oral)   Resp 18   Ht 5\' 2"  (1.575 m)   Wt 58.8 kg (129 lb 9.6 oz)   SpO2 98%   BMI 23.70 kg/m  Alert and oriented x 4, speech is clear and fluent Moving well Normal strength lower extremities Wound is clean, dry, without signs of infection Discharge tomorrow.

## 2016-08-01 NOTE — Evaluation (Signed)
Occupational Therapy Evaluation Patient Details Name: Kaitlyn Hoover MRN: [id number] DOB: 08-24-1941 Today's Date: 08/01/2016    History of Present Illness Patient is a 75 y/o female with hx of post polio, A-fib, bipolar disorder, HTN and TB presents s/p L4-5, L5-S1 PLIF.   Clinical Impression   Pt admitted with the above diagnoses and presents with below problem list. Pt will benefit from continued acute OT to address the below listed deficits and maximize independence with basic ADLs prior to d/c home with family. PTA pt was independent with ADLs. Pt is currently min guard with LB ADLs and functional mobility; min A for tub transfer. Cues needed at times for adherence to back precautions.       Follow Up Recommendations  Home health OT;Supervision/Assistance - 24 hour    Equipment Recommendations  3 in 1 bedside comode    Recommendations for Other Services       Precautions / Restrictions Precautions Precautions: Back Precaution Booklet Issued: Yes (comment) Precaution Comments: Reviewed back precautions Required Braces or Orthoses: Spinal Brace Spinal Brace: Lumbar corset Restrictions Weight Bearing Restrictions: No      Mobility Bed Mobility Overal bed mobility: Needs Assistance Bed Mobility: Rolling;Sidelying to Sit;Sit to Sidelying Rolling: Min guard Sidelying to sit: Min guard     Sit to sidelying: Min guard General bed mobility comments: HOB slightly elevated during sidelying>EOB. Rails used. Extra time and effort. Cues for technique.  Transfers Overall transfer level: Needs assistance Equipment used: Rolling walker (2 wheeled) Transfers: Sit to/from Stand Sit to Stand: Min guard;From elevated surface         General transfer comment: from EOB and 3n1 over toilet. Cues not to leave rw to the side and for hand placement.     Balance Overall balance assessment: Needs assistance Sitting-balance support: Feet supported;No upper extremity  supported Sitting balance-Leahy Scale: Good Sitting balance - Comments: Able to perform pericare without assist, cues to avoid twisting.   Standing balance support: Bilateral upper extremity supported;During functional activity Standing balance-Leahy Scale: Fair Standing balance comment: Reaching for furniture for support, balance improved with use of RW for ambulation.                             ADL Overall ADL's : Needs assistance/impaired Eating/Feeding: Set up;Sitting   Grooming: Wash/dry hands;Min guard;Standing   Upper Body Bathing: Set up;Sitting;Cueing for compensatory techniques   Lower Body Bathing: Min guard;Cueing for back precautions;Sit to/from stand   Upper Body Dressing : Set up;Sitting;Cueing for compensatory techniques   Lower Body Dressing: Min guard;Cueing for back precautions;Sit to/from stand   Toilet Transfer: Designer, television/film set Details (indicate cue type and reason): 3n1 over toilet Toileting- Clothing Manipulation and Hygiene: Set up;Min guard;Sitting/lateral lean Toileting - Clothing Manipulation Details (indicate cue type and reason): Pt initially leaning to left for anterior pericare. Educated on techniques/AE to avoid bending or twisting during pericare. Tub/ Shower Transfer: Tub transfer;Minimal assistance;Min guard;Ambulation;3 in 1;Rolling walker Tub/Shower Transfer Details (indicate cue type and reason): Educated on having someone with her during tub transfers.  Functional mobility during ADLs: Min guard;Rolling walker General ADL Comments: Pt completed in-room functional mobility, toilet transfer, pericare, and grooming task as detaled above. Cues needed at times for adherance to BAT precautions during session.     Vision     Perception     Praxis      Pertinent Vitals/Pain Pain Assessment: Faces Pain Score: 5  Faces  Pain Scale: Hurts little more Pain Location: back Pain Descriptors / Indicators: Sore Pain  Intervention(s): Premedicated before session;Monitored during session;Repositioned     Hand Dominance     Extremity/Trunk Assessment Upper Extremity Assessment Upper Extremity Assessment: Overall WFL for tasks assessed   Lower Extremity Assessment Lower Extremity Assessment: Defer to PT evaluation   Cervical / Trunk Assessment Cervical / Trunk Assessment: Other exceptions Cervical / Trunk Exceptions: s/p back surgery   Communication Communication Communication: No difficulties   Cognition Arousal/Alertness: Awake/alert Behavior During Therapy: WFL for tasks assessed/performed Overall Cognitive Status: Within Functional Limits for tasks assessed (cues to adhere to BAT precautions during session)                     General Comments       Exercises       Shoulder Instructions      Home Living Family/patient expects to be discharged to:: Private residence Living Arrangements: Other relatives (brother and son) Available Help at Discharge: Family;Available 24 hours/day Type of Home: House Home Access: Stairs to enter [employer] of Steps: 6+6 to side door to kitchen or 3 STE to front door Entrance Stairs-Rails: Left;Can reach both Home Layout: One level     Bathroom Shower/Tub: Tub/shower unit Shower/tub characteristics: Architectural technologist: Standard Bathroom Accessibility: No   Home Equipment: Shower seat;Grab bars - tub/shower          Prior Functioning/Environment Level of Independence: Independent        Comments: Pt drives, rides buggys and scooters in grocery store. Has a brace for RLE but does not wear it due to hx of polio.     OT Diagnosis: Acute pain   OT Problem List: Impaired balance (sitting and/or standing);Decreased safety awareness;Decreased knowledge of use of DME or AE;Decreased knowledge of precautions;Pain   OT Treatment/Interventions: Self-care/ADL training;DME and/or AE instruction;Therapeutic  activities;Patient/family education;Balance training    OT Goals(Current goals can be found in the care plan section) Acute Rehab OT Goals Patient Stated Goal: to be able to take my dog for a walk OT Goal Formulation: With patient Time For Goal Achievement: 08/08/16 Potential to Achieve Goals: Good ADL Goals Pt Will Perform Lower Body Bathing: with modified independence;sit to/from stand Pt Will Perform Lower Body Dressing: with modified independence;sit to/from stand Pt Will Transfer to Toilet: with modified independence;ambulating (3n1 over toilet) Pt Will Perform Toileting - Clothing Manipulation and hygiene: with modified independence;with adaptive equipment;sit to/from stand;sitting/lateral leans Pt Will Perform Tub/Shower Transfer: Tub transfer;with supervision;ambulating;shower seat;rolling walker  OT Frequency: Min 2X/week   Barriers to D/C:            Co-evaluation              End of Session Equipment Utilized During Treatment: Gait belt;Rolling walker;Back brace  Activity Tolerance: Patient tolerated treatment well Patient left: in bed;with call bell/phone within reach;with bed alarm set   Time: 1325-1345 OT Time Calculation (min): 20 min Charges:  OT General Charges $OT Visit: 1 Procedure OT Evaluation $OT Eval Low Complexity: 1 Procedure G-Codes:    Lettie, Munn 08/12/2016, 2:03 PM

## 2016-08-02 DIAGNOSIS — Z419 Encounter for procedure for purposes other than remedying health state, unspecified: Secondary | ICD-10-CM

## 2016-08-02 DIAGNOSIS — M4316 Spondylolisthesis, lumbar region: Secondary | ICD-10-CM | POA: Diagnosis not present

## 2016-08-02 DIAGNOSIS — R269 Unspecified abnormalities of gait and mobility: Secondary | ICD-10-CM | POA: Diagnosis not present

## 2016-08-02 MED ORDER — OXYCODONE-ACETAMINOPHEN 5-325 MG PO TABS: 1.0000 | ORAL_TABLET | Freq: Four times a day (QID) | ORAL | 0 refills | Status: DC | PRN

## 2016-08-02 MED ORDER — TIZANIDINE HCL 4 MG PO TABS: 4.0000 mg | ORAL_TABLET | Freq: Four times a day (QID) | ORAL | 0 refills | Status: DC | PRN

## 2016-08-02 NOTE — Care Management Note (Signed)
Case Management Note  Patient Details  Name: Kaitlyn Hoover MRN: [id number] Date of Birth: 29-Dec-1940  Subjective/Objective:                 Patient from home, lives on first floor brother lives in basement apartment. Patient declines HH, but requested RW and #in !. Referral placed to [facility] for DME to be delivered to room.    Action/Plan:  No further CM needs at this time.  Expected Discharge Date:  08/04/16               Expected Discharge Plan:  Home/Self Care  In-House Referral:  NA  Discharge planning Services  CM Consult  Post Acute Care Choice:  Durable Medical Equipment Choice offered to:  Patient  DME Arranged:  3-N-1, Walker rolling DME Agency:  [employer]:  Patient Refused [facility] Agency:     Status of Service:  Completed, signed off  If discussed at [employer] of Stay Meetings, dates discussed:    Additional Comments:  Carles Collet, RN 08/02/2016, 11:16 AM

## 2016-08-02 NOTE — Progress Notes (Addendum)
Pt discharged home with family, by car, assessment stable, discharge instructions reviewed, 2 printed prescriptions given, all questions answered. No IV present.  Pt taken by wheelchair to exit. Pt has 2 pieces of equipment being sent home with her. Pt has dentures with her. At this time waiting for pts son to arrive to be discharged.  Son arrived at 13, went over discharge instructions with son. Pt refused wheelchair. Walked out using her walker.    Time of discharge: 1718

## 2016-08-02 NOTE — Progress Notes (Cosign Needed)
Occupational Therapy Treatment Patient Details Name: Kaitlyn Hoover MRN: [id number] DOB: 06-17-1941 Today's Date: 08/02/2016    History of present illness Patient is a 75 y/o female with hx of post polio, A-fib, bipolar disorder, HTN and TB presents s/p L4-5, L5-S1 PLIF.   OT comments  Pt. Making gains with skilled OT and is performing all ADLS at MOD I level of A.  Clear for d/c from OT. OTR/l to sign off.   Follow Up Recommendations  Home health OT;Supervision/Assistance - 24 hour    Equipment Recommendations  3 in 1 bedside comode    Recommendations for Other Services      Precautions / Restrictions Precautions Precautions: Back Precaution Booklet Issued: Yes (comment) Precaution Comments: Reviewed back precautions. Able to recall 3/3 independently. Required Braces or Orthoses: Spinal Brace Spinal Brace: Lumbar corset Restrictions Weight Bearing Restrictions: No       Mobility Bed Mobility Overal bed mobility: Modified Independent Bed Mobility: Rolling;Sidelying to Sit Rolling: Modified independent (Device/Increase time) Sidelying to sit: Modified independent (Device/Increase time)     Sit to sidelying: Supervision General bed mobility comments: HOB flat, no use of rails to simulate home.   Transfers Overall transfer level: Modified independent Equipment used: Rolling walker (2 wheeled) Transfers: Sit to/from [facility] Sit to Stand: Modified independent (Device/Increase time) Stand pivot transfers: Modified independent (Device/Increase time)       General transfer comment: Min guard to steady in standing. No LOB or assist needed.    Balance Overall balance assessment: Needs assistance Sitting-balance support: Feet supported;No upper extremity supported Sitting balance-Leahy Scale: Good     Standing balance support: During functional activity;Single extremity supported Standing balance-Leahy Scale: Fair Standing balance comment: Able to  adjust sock in standing with 1 UE support while maintaining back precautions.                   ADL Overall ADL's : Needs assistance/impaired                 Upper Body Dressing : Set up;Sitting Upper Body Dressing Details (indicate cue type and reason): able to don brace without assist Lower Body Dressing: Set up;Sitting/lateral leans;Sit to/from stand Lower Body Dressing Details (indicate cue type and reason): seated, able to complete without use of A/E but still interested in possible purchase of reacher  Toilet Transfer: Supervision/safety;RW Toilet Transfer Details (indicate cue type and reason): simulated during functional mobility in the room Toileting- Clothing Manipulation and Hygiene: Supervision/safety Toileting - Clothing Manipulation Details (indicate cue type and reason): simulated with functional mobility in the room Tub/ Shower Transfer: Tub transfer;Supervision/safety;Grab bars;Rolling walker;Shower Scientist, research (medical) Details (indicate cue type and reason): pt. able to return demo of tub transfer for R faucet with use of grab bars, hand held shower head, and shower seat.  son will be present for transfers Functional mobility during ADLs: Supervision/safety        Vision                     Perception     Praxis      Cognition   Behavior During Therapy: [facility] for tasks assessed/performed Overall Cognitive Status: Within Functional Limits for tasks assessed                       Extremity/Trunk Assessment               Exercises     Shoulder Instructions  General Comments      Pertinent Vitals/ Pain       Pain Assessment: No/denies pain Pain Score: 2  Pain Location: back at incision Pain Descriptors / Indicators: Sore Pain Intervention(s): Monitored during session;Repositioned  Home Living                                          Prior Functioning/Environment               Frequency   OT Diagnosis  Min 2X/week    Muscle weakness (generalized)  Surgery, elective - Plan: DG Lumbar Spine 2-3 Views, DG Lumbar Spine 2-3 Views     Progress Toward Goals  OT Goals(current goals can now be found in the care plan section)  Progress towards OT goals: Goals met/education completed, patient discharged from [facility] Discharge plan remains appropriate    Co-evaluation                 End of Session Equipment Utilized During Treatment: Rolling walker   Activity Tolerance Patient tolerated treatment well   Patient Left in chair;with call bell/phone within reach   Nurse Communication          Time: 8592-9244 OT Time Calculation (min): 13 min  Charges: OT General Charges $OT Visit: 1 Procedure OT Treatments $Self Care/Home Management : 8-22 mins  Janice Coffin , COTA/L 08/02/2016, 11:15 AM

## 2016-08-02 NOTE — Discharge Instructions (Signed)

## 2016-08-02 NOTE — Progress Notes (Signed)
Physical Therapy Treatment Patient Details Name: Kaitlyn Hoover MRN: [id number] DOB: November 14, 1941 Today's Date: 08/02/2016    History of Present Illness Patient is a 76 y/o female with hx of post polio, A-fib, bipolar disorder, HTN and TB presents s/p L4-5, L5-S1 PLIF.    PT Comments    Patient progressing well towards PT goals. Tolerated stair training with min guard assist. Pt able to recall 3/3 back precautions and better able to follow them during mobility today with some cueing. Lengthy discussion with pt about performing functional activities at home while adhering to back precautions. Pt will have assist from son for 10 days and brother at discharge. Will continue to follow.   Follow Up Recommendations  Supervision - Intermittent;Home health PT     Equipment Recommendations  Rolling walker with 5" wheels;3in1 (PT)    Recommendations for Other Services       Precautions / Restrictions Precautions Precautions: Back Precaution Booklet Issued: Yes (comment) Precaution Comments: Reviewed back precautions. Able to recall 3/3 independently. Required Braces or Orthoses: Spinal Brace Spinal Brace: Lumbar corset Restrictions Weight Bearing Restrictions: No    Mobility  Bed Mobility Overal bed mobility: Modified Independent Bed Mobility: Rolling;Sidelying to Sit Rolling: Modified independent (Device/Increase time) Sidelying to sit: Modified independent (Device/Increase time)     Sit to sidelying: Supervision General bed mobility comments: HOB flat, no use of rails to simulate home.   Transfers Overall transfer level: Modified independent Equipment used: Rolling walker (2 wheeled) Transfers: Sit to/from [facility] Sit to Stand: Modified independent (Device/Increase time) Stand pivot transfers: Modified independent (Device/Increase time)       General transfer comment: Min guard to steady in standing. No LOB or assist  needed.  Ambulation/Gait Ambulation/Gait assistance: Min guard Ambulation Distance (Feet): 200 Feet Assistive device: Rolling walker (2 wheeled) Gait Pattern/deviations: Step-through pattern;Decreased stride length Gait velocity: decreased Gait velocity interpretation: <1.8 ft/sec, indicative of risk for recurrent falls General Gait Details: Slow, steady gait with some difficulty with turns; better able to follow back precautions today.   Stairs Stairs: Yes Stairs assistance: Min guard Stair Management: Step to pattern;One rail Left Number of Stairs: 3 General stair comments: Cues for technique and safety.   Wheelchair Mobility    Modified Rankin (Stroke Patients Only)       Balance Overall balance assessment: Needs assistance Sitting-balance support: Feet supported;No upper extremity supported Sitting balance-Leahy Scale: Good     Standing balance support: During functional activity;Single extremity supported Standing balance-Leahy Scale: Fair Standing balance comment: Able to adjust sock in standing with 1 UE support while maintaining back precautions.                    Cognition Arousal/Alertness: Awake/alert Behavior During Therapy: WFL for tasks assessed/performed Overall Cognitive Status: Within Functional Limits for tasks assessed                      Exercises      General Comments        Pertinent Vitals/Pain Pain Assessment: No/denies pain Pain Score: 2  Pain Location: back at incision Pain Descriptors / Indicators: Sore Pain Intervention(s): Monitored during session;Repositioned    Home Living                      Prior Function            PT Goals (current goals can now be found in the care plan section) Progress towards PT  goals: Progressing toward goals    Frequency   PT Diagnosis  Min 5X/week   Muscle weakness (generalized)  Surgery, elective - Plan: DG Lumbar Spine 2-3 Views, DG Lumbar Spine 2-3  Views     PT Plan Current plan remains appropriate    Co-evaluation             End of Session Equipment Utilized During Treatment: Gait belt;Back brace Activity Tolerance: Patient tolerated treatment well Patient left: in bed;with call bell/phone within reach;with bed alarm set     Time: [id number] PT Time Calculation (min) (ACUTE ONLY): 23 min  Charges:  $Gait Training: 8-22 mins $Self Care/Home Management: 8-22                    G Codes:      Kason Benak A Rayner Erman 08/02/2016, 11:15 AM Wray Kearns, [place], DPT [phone]

## 2016-08-02 NOTE — Progress Notes (Signed)
Pt ambulating independently with brace and walker, yellow socks on. Will start checking pt off independent.

## 2016-08-02 NOTE — Progress Notes (Signed)
Pt eager to be discharged, also wants to know if she should start back on eloquis today. rn called md office early this afternoon. Waiting for a reply.

## 2016-08-02 NOTE — Consult Note (Signed)
            [facility] CM Primary Care Navigator  08/02/2016  Kaitlyn Hoover 1940/12/02 [id number]  Patient seen at the bedside to identify possible discharge needs. She states she had worsening pain to both legs and back that made her decide to undergo surgery.  Patient confirms Dr. [name] from [place] at [facility] as her primary care provider.    Patient shared using [name] in [place] to obtain medications and manages her own medicines using "pillbox" system. She mentioned that cardiologist's office had aided in processing coverage for 3 months supply of Eliquis while in "donut hole".   According to patient, she drives herself prior to surgery. Her neighbor/ close friend [name]) will be able to provide transportation (to doctors' appointments) when needed. Her son (from [place]) will be with her for 10 days to assist with care. Patient also has a brother whom she lives with in the house that can assist with her care as stated.  Patient voiced understanding to call primary care provider's office for a post discharge follow-up appointment within a week or sooner if needed. Patient letter given for reminder.  She denies further needs or concerns at this time.         For additional questions please contact:  [name] A. Lucky Alverson, BSN, RN-BC [facility] PRIMARY CARE Navigator Cell: [phone]

## 2016-08-13 NOTE — Discharge Summary (Signed)
Physician Discharge Summary  Patient ID: Kaitlyn Hoover MRN: [id number] DOB/AGE: 02-12-1941 75 y.o.  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 Ocana was admitted and taken to the operating room for an uncomplicated lumbar fusion at L4-S1. Post op her wound is clean, dry, and without signs of infection at discharge. She is moving all extremities, voiding, and tolerating a regular diet.   Treatments: surgery: Lumbar four-five posterior lumbar interbody arthrodesis (Synthes opal)10mm x83mm x2cages, autograft morsels Lumbar laminectomy L4,5,hemilaminectomy S1 for decompression of the L4, L5, and S1 nerve roots, beyond the needed exposure for a PLIF Posterolateral arthrodesis L4/5,5/S1 autograft morsels Segmental pedicle screw(nuvasive) fixation L4-S1   Discharge Exam: Blood pressure (!) 130/58, pulse 80, temperature 99.9 F (37.7 C), temperature source Oral, resp. rate 18, height 5\' 2"  (1.575 m), weight 58.8 kg (129 lb 9.6 oz), SpO2 94 %. General appearance: alert, cooperative, appears stated age and mild distress Neurologic: Alert and oriented X 3, normal strength and tone. Normal symmetric reflexes. Normal coordination and gait  Disposition: 01-Home or Self Care Spinal stenosis of lumbar region    Medication List    TAKE these medications   acyclovir 400 MG tablet Commonly known as:  ZOVIRAX Take 400 mg by mouth daily.   alendronate 70 MG tablet Commonly known as:  FOSAMAX Take 70 mg by mouth once a week. Thursday Take  with a full glass of water on an empty stomach.   amoxicillin-clavulanate 875-125 MG tablet Commonly known as:  AUGMENTIN Take 1 tablet by mouth every 12 (twelve) hours.   apixaban 5 MG Tabs tablet Commonly known as:  ELIQUIS Take 1 tablet (5 mg total) by mouth 2 (two) times daily.   CALCIUM-CARB 600 + D PO Take 2 tablets by mouth at bedtime.   Co Q-10 100 MG Caps Take 100 mg by mouth daily.   diltiazem 180 MG 24 hr capsule Commonly known as:  CARDIZEM CD Take 1 capsule (180 mg total) by mouth daily.   FANAPT 2 MG Tabs Generic drug:  Iloperidone Take 3 mg by mouth at bedtime.   fexofenadine 180 MG tablet Commonly known as:  ALLEGRA Take 180 mg by mouth daily.   FISH OIL BURP-LESS 1000 MG Caps Take 3,000 Units by mouth 2 (two) times daily.   Ginkgo Biloba Extract 60 MG Caps Take 240 mg by mouth daily.   levothyroxine 100 MCG tablet Commonly known as:  SYNTHROID, LEVOTHROID Take 100 mcg by mouth daily.   Melatonin 3 MG Tabs Take 3 mg by mouth at bedtime.   multivitamin capsule Take 1 capsule by mouth 2 (two) times daily.   omeprazole 20 MG capsule Commonly known as:  PRILOSEC Take 20 mg by mouth every morning.   oxybutynin 10 MG 24 hr tablet Commonly known as:  DITROPAN-XL Take 10 mg by mouth 2 (two) times daily.   oxyCODONE-acetaminophen 5-325 MG tablet Commonly known as:  ROXICET Take 1 tablet by mouth every 6 (six) hours as needed for severe pain.   polyethylene glycol packet Commonly known as:  MIRALAX / GLYCOLAX Take 17 g by mouth at bedtime.   rosuvastatin 10 MG tablet  Commonly known as:  CRESTOR Take 1 tablet by mouth once a week as tolerated.   temazepam 15 MG capsule Commonly known as:  RESTORIL Take 15 mg by mouth at bedtime as needed for sleep.   tiZANidine 4 MG tablet Commonly known as:  ZANAFLEX Take 1 tablet (4 mg total) by mouth every 6 (six) hours as needed for muscle spasms.   traMADol 50 MG tablet Commonly known as:   ULTRAM Take 1 tablet (50 mg total) by mouth 3 (three) times daily as needed. What changed:  when to take this   vitamin C 1000 MG tablet Take 1,000 mg by mouth daily.      Follow-up Information    Maryn Freelove L, MD Follow up in 3 day(s).   Specialty:  Neurosurgery Why:  please call to make an appointment Contact information: [address] [place] [address] [phone]           Signed: Winfield Cunas 08/13/2016, 7:13 PM

## 2016-08-19 ENCOUNTER — Other Ambulatory Visit: Payer: Self-pay | Admitting: Cardiology

## 2016-08-19 DIAGNOSIS — I10 Essential (primary) hypertension: Secondary | ICD-10-CM

## 2016-08-20 ENCOUNTER — Other Ambulatory Visit: Payer: Self-pay | Admitting: *Deleted

## 2016-08-20 DIAGNOSIS — I10 Essential (primary) hypertension: Secondary | ICD-10-CM

## 2016-08-20 MED ORDER — DILTIAZEM HCL ER COATED BEADS 180 MG PO CP24: 180.0000 mg | ORAL_CAPSULE | Freq: Every day | ORAL | 3 refills | Status: DC

## 2016-08-22 DIAGNOSIS — M4316 Spondylolisthesis, lumbar region: Secondary | ICD-10-CM | POA: Diagnosis not present

## 2016-08-28 DIAGNOSIS — R042 Hemoptysis: Secondary | ICD-10-CM | POA: Diagnosis not present

## 2016-08-28 DIAGNOSIS — Z09 Encounter for follow-up examination after completed treatment for conditions other than malignant neoplasm: Secondary | ICD-10-CM | POA: Diagnosis not present

## 2016-08-28 DIAGNOSIS — Z23 Encounter for immunization: Secondary | ICD-10-CM | POA: Diagnosis not present

## 2016-08-28 DIAGNOSIS — R399 Unspecified symptoms and signs involving the genitourinary system: Secondary | ICD-10-CM | POA: Diagnosis not present

## 2016-09-05 ENCOUNTER — Ambulatory Visit (INDEPENDENT_AMBULATORY_CARE_PROVIDER_SITE_OTHER): Payer: PPO | Admitting: Neurology

## 2016-09-05 ENCOUNTER — Encounter: Payer: Self-pay | Admitting: Neurology

## 2016-09-05 VITALS — BP 130/64 | HR 70 | Resp 14 | Ht 62.0 in | Wt 130.0 lb

## 2016-09-05 DIAGNOSIS — G14 Postpolio syndrome: Secondary | ICD-10-CM | POA: Diagnosis not present

## 2016-09-05 DIAGNOSIS — M4316 Spondylolisthesis, lumbar region: Secondary | ICD-10-CM

## 2016-09-05 DIAGNOSIS — M48062 Spinal stenosis, lumbar region with neurogenic claudication: Secondary | ICD-10-CM | POA: Diagnosis not present

## 2016-09-05 DIAGNOSIS — M62569 Muscle wasting and atrophy, not elsewhere classified, unspecified lower leg: Secondary | ICD-10-CM

## 2016-09-05 NOTE — Progress Notes (Signed)
[facility]  PATIENT: Kaitlyn Hoover DOB: 1941-10-30  REFERRING DOCTOR OR PCP:  [name], MD SOURCE: patient, records form Dr. Justin Mend, records in EMR  _________________________________   HISTORICAL  CHIEF COMPLAINT:  Chief Complaint  Patient presents with  . Gait Disturbance    Sts. gait and pain in legs is much improved since having surgery on 09-30-16.      HISTORY OF PRESENT ILLNESS:  Kaitlyn Hoover is a 75 year old woman who had progressive weakness in her legs over the past 20 years. She was diagnosed with post polio syndrome in 1998.    MRI of the lumbar spine showed severe spinal stenosis.    Her legs felt weaker and more painful as she walked more.     Pain was mildly better when she leans forward as she walks.         Dr. Christella Noa at [facility] did a fusion surgey and she feels the pain and gait are both better.   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  She had severe L4L5 stenosis with AP diameter only 5 mm and apparent left S1 nerve root compression on MRI.    Post-op images were reviewed and alignment looks good.    She feels strength is better though still has right > left leg weakness and has atrophy more on the right (right side has always been worse)   Polio History:   She had polio when she was 4 and it affected her legs, much more so on the right. Over the next year or 2 she recovered and was able to be very active as a child. Starting around age 64, she began to notice that she was getting weaker again in the legs. Initially the right leg was more involved but both legs seem to be involved now.   In 1998, she went to a post polio center in [place] and had a nerve conduction/EMG test performed confirming the history of polio and she was told that it was consistent with post polio syndrome.  Those results are not available at this time.  Spinal stenosis: MRI of the lumbar spine shows severe spinal  stenosis at L4-L5 with an AP diameter of only about 5. Mm.   There is also left greater than right lateral recess stenosis there that could be causing L5 nerve root compression. There is milder spinal stenosis at L5-S1 that could lead to left S1 nerve root compression.   She does report symptoms of leg pain as she walks that are generally better when she is leaning forward generally worse when she is standing up straight. She finds her legs become weaker and weaker as she walks.  REVIEW OF SYSTEMS: Constitutional: No fevers, chills, sweats, or change in appetite Eyes: No visual changes, double vision, eye pain Ear, nose and throat: No hearing loss, ear pain, nasal congestion, sore throat Cardiovascular: No chest pain, palpitations Respiratory: No shortness of breath at rest or with exertion.   No wheezes GastrointestinaI: No nausea, vomiting, diarrhea, abdominal pain, fecal incontinence Genitourinary: No dysuria, urinary retention or frequency.  No nocturia. Musculoskeletal: No neck pain, back pain Integumentary: No rash, pruritus, skin lesions Neurological: as above Psychiatric: No depression at this time.  No anxiety Endocrine: No palpitations, diaphoresis, change in appetite, change in weigh or increased thirst Hematologic/Lymphatic: No anemia, purpura, petechiae. Allergic/Immunologic: No itchy/runny eyes, nasal congestion, recent allergic reactions, rashes  ALLERGIES: Allergies  Allergen Reactions  .  Sulfa Antibiotics Rash    Childhood allergy  . Ciprocinonide [Fluocinolone] Rash    HOME MEDICATIONS:  Current Outpatient Prescriptions:  .  acyclovir (ZOVIRAX) 400 MG tablet, Take 400 mg by mouth daily. , Disp: , Rfl:  .  alendronate (FOSAMAX) 70 MG tablet, Take 70 mg by mouth once a week. Thursday Take with a full glass of water on an empty stomach., Disp: , Rfl:  .  amoxicillin-clavulanate (AUGMENTIN) 875-125 MG tablet, Take 1 tablet by mouth every 12 (twelve) hours., Disp: ,  Rfl:  .  apixaban (ELIQUIS) 5 MG TABS tablet, Take 1 tablet (5 mg total) by mouth 2 (two) times daily., Disp: 180 tablet, Rfl: 1 .  Ascorbic Acid (VITAMIN C) 1000 MG tablet, Take 1,000 mg by mouth daily., Disp: , Rfl:  .  Calcium Carbonate-Vitamin D (CALCIUM-CARB 600 + D PO), Take 2 tablets by mouth at bedtime., Disp: , Rfl:  .  Coenzyme Q10 (CO Q-10) 100 MG CAPS, Take 100 mg by mouth daily. , Disp: , Rfl:  .  diltiazem (CARDIZEM CD) 180 MG 24 hr capsule, Take 1 capsule (180 mg total) by mouth daily., Disp: 90 capsule, Rfl: 3 .  FANAPT 2 MG TABS, Take 3 mg by mouth at bedtime. , Disp: , Rfl:  .  fexofenadine (ALLEGRA) 180 MG tablet, Take 180 mg by mouth daily., Disp: , Rfl:  .  Ginkgo Biloba Extract 60 MG CAPS, Take 240 mg by mouth daily., Disp: , Rfl:  .  levothyroxine (SYNTHROID, LEVOTHROID) 100 MCG tablet, Take 100 mcg by mouth daily., Disp: , Rfl:  .  Melatonin 3 MG TABS, Take 3 mg by mouth at bedtime., Disp: , Rfl:  .  Multiple Vitamin (MULTIVITAMIN) capsule, Take 1 capsule by mouth 2 (two) times daily., Disp: , Rfl:  .  Omega-3 Fatty Acids (FISH OIL BURP-LESS) 1000 MG CAPS, Take 3,000 Units by mouth 2 (two) times daily., Disp: , Rfl:  .  omeprazole (PRILOSEC) 20 MG capsule, Take 20 mg by mouth every morning., Disp: , Rfl:  .  oxybutynin (DITROPAN-XL) 10 MG 24 hr tablet, Take 10 mg by mouth 2 (two) times daily., Disp: , Rfl:  .  oxyCODONE-acetaminophen (ROXICET) 5-325 MG tablet, Take 1 tablet by mouth every 6 (six) hours as needed for severe pain., Disp: 80 tablet, Rfl: 0 .  polyethylene glycol (MIRALAX / GLYCOLAX) packet, Take 17 g by mouth at bedtime., Disp: , Rfl:  .  rosuvastatin (CRESTOR) 10 MG tablet, Take 1 tablet by mouth once a week as tolerated., Disp: 5 tablet, Rfl: 11 .  temazepam (RESTORIL) 15 MG capsule, Take 15 mg by mouth at bedtime as needed for sleep., Disp: , Rfl:  .  tiZANidine (ZANAFLEX) 4 MG tablet, Take 1 tablet (4 mg total) by mouth every 6 (six) hours as needed for  muscle spasms., Disp: 60 tablet, Rfl: 0 .  traMADol (ULTRAM) 50 MG tablet, Take 1 tablet (50 mg total) by mouth 3 (three) times daily as needed. (Patient taking differently: Take 50 mg by mouth 2 (two) times daily. ), Disp: 90 tablet, Rfl: 5  PAST MEDICAL HISTORY: Past Medical History:  Diagnosis Date  . Anxiety   . Arthritis   . Atrial fibrillation with RVR ([place])   . Bipolar disorder ([place])   . Dysrhythmia   . GERD (gastroesophageal reflux disease)   . Hypertension   . Hypothyroidism   . Palpitations   . PAT (paroxysmal atrial tachycardia) ([place])   . Post-polio syndrome   .  Sinus infection   . 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 ABDOMINAL HYSTERECTOMY    . CATARACT EXTRACTION W/ INTRAOCULAR LENS  IMPLANT, BILATERAL    . CERVICAL LAMINECTOMY    . CESAREAN SECTION     X2   . LEGS      AGE 11-11   SEVERAL SURGERIES FOR POLIO   . THYROIDECTOMY      FAMILY HISTORY: Family History  Problem Relation Age of Onset  . Hypertension Mother   . Cancer Father     JAW BONE  . COPD Brother   . Atrial fibrillation Brother     SOCIAL HISTORY:  Social History   Social History  . Marital status: Divorced    Spouse name: N/A  . Number of children: N/A  . Years of education: N/A   Occupational History  . Not on file.   Social History Main Topics  . Smoking status: Former Research scientist (life sciences)  . Smokeless tobacco: Never Used  . Alcohol use Yes     Comment: GLASS WINE W/ DINNER  . Drug use: No  . Sexual activity: Not on file   Other Topics Concern  . Not on file   Social History Narrative  . No narrative on file     PHYSICAL EXAM  Vitals:   09/05/16 1441  BP: 130/64  Pulse: 70  Resp: 14  Weight: 130 lb (59 kg)  Height: 5\' 2"  (1.575 m)    Body mass index is 23.78 kg/m.   General: The patient is well-developed and well-nourished and in no acute distress   Extremities:   Right leg is shorter  than left  Musculoskeletal:  Back is nontender  Neurologic Exam  Mental status: The patient is alert and oriented x 3 at the time of the examination. The patient has apparent normal recent and remote memory, with an apparently normal attention span and concentration ability.   Speech is normal.  Cranial nerves: Extraocular movements are full.  There is good facial sensation to soft touch bilaterally.Facial strength is normal.  Trapezius and sternocleidomastoid strength is normal. No dysarthria is noted.  The tongue is midline, and the patient has symmetric elevation of the soft palate. No obvious hearing deficits are noted.  Motor:  Muscle bulk is reduced in right leg.   Tone is normal. Strength is  5 / 5 in arms and left leg.   Strength is 4+-5/5 in iliopsoas and quads, 4/5 hamstrings, 3/5 tib ant, and 1/5 EHL , 2/5 EDB..   Sensory: Sensory testing is intact to pinprick, soft touch and vibration sensation in all 4 extremities.  Coordination: Cerebellar testing reveals good finger-nose-finger and heel-to-shin bilaterally.  Gait and station: Station is normal.   Gait has right foot drop. Tandem gait is wide. Romberg is negative.   Reflexes: Deep tendon reflexes are reduced in right leg compared to left.   Plantar responses are flexor.    DIAGNOSTIC DATA (LABS, IMAGING, TESTING) - I reviewed patient records, labs, notes, testing and imaging myself where available.  Lab Results  Component Value Date   WBC 7.9 07/24/2016   HGB 12.7 07/24/2016   HCT 39.5 07/24/2016   MCV 95.0 07/24/2016   PLT 227 07/24/2016      Component Value Date/Time   NA 138 07/24/2016 1539   K 3.9 07/24/2016 1539   CL 105 07/24/2016 1539   CO2 27 07/24/2016 1539  GLUCOSE 98 07/24/2016 1539   BUN 21 (H) 07/24/2016 1539   CREATININE 0.94 07/24/2016 1539   CALCIUM 9.3 07/24/2016 1539   PROT 6.8 07/16/2016 0958   ALBUMIN 3.8 07/16/2016 0958   AST 18 07/16/2016 0958   ALT 12 07/16/2016 0958   ALKPHOS 59  07/16/2016 0958   BILITOT 0.4 07/16/2016 0958   GFRNONAA 58 (L) 07/24/2016 1539   GFRAA >60 07/24/2016 1539   Lab Results  Component Value Date   CHOL 173 07/16/2016   HDL 43 (L) 07/16/2016   LDLCALC 109 07/16/2016   TRIG 105 07/16/2016   CHOLHDL 4.0 07/16/2016   No results found for: HGBA1C No results found for: VITAMINB12 Lab Results  Component Value Date   TSH 0.748 04/18/2014       ASSESSMENT AND PLAN  No diagnosis found.  1.   We had a further discussion about her spinal stenosis and possibility of surgery. I believe that her worsening gait is a combination of the spinal stenosis and her postpolio syndrome. The pain that she experiences with walking is more likely due to entirely from the spinal stenosis. I think she would have a reasonable chance of improved gait function is decompressive surgery was performed, though unfortunately this can't be guaranteed. Pain at baseline is minimal and hopefully she would not have too much pain after the surgery. She is going to give surgery some additional thought and get back to Dr. Cyndy Freeze if she opts to proceed.. Today, I reviewed the MRI of her lumbar spine in her presence. 2.    She asked about stem cells and postpolio syndrome. Unfortunately, stem cell centers with [employer] and ridiculous claims are popping up all over the country. Amazingly, some are even charging patients thousands of dollars to be part of a "research study".  I would recommend that she not consider the liposuction/stem cell procedures as there is no evidence that this would help her in any way. There has been some legitimate stem cell research at academic centers with motor neuron disease such as ALS though I am unfamiliar with anything involving postpolio at this time. 3.    She will continue her current medications. 4.  She will return to see me in 4 months or sooner if there are new or worsening neurologic symptoms.  45 minutes face-to-face evaluation with  greater than one half of the time counseling and coordinating care about her spinal stenosis, post polio and various treatment options.   Kristain Filo A. Felecia Shelling, MD, PhD [id number], [id number] PM Certified in Neurology, Clinical Neurophysiology, Sleep Medicine, Pain Medicine and Neuroimaging  [facility] [address], [place], [place] [address] [phone]

## 2016-09-06 ENCOUNTER — Other Ambulatory Visit: Payer: Self-pay | Admitting: Interventional Cardiology

## 2016-09-06 DIAGNOSIS — I6523 Occlusion and stenosis of bilateral carotid arteries: Secondary | ICD-10-CM

## 2016-09-11 ENCOUNTER — Ambulatory Visit
Admission: RE | Admit: 2016-09-11 | Discharge: 2016-09-11 | Disposition: A | Discharge status: Home / Self Care | Payer: PPO | Source: Ambulatory Visit | Attending: Family Medicine | Admitting: Family Medicine

## 2016-09-11 ENCOUNTER — Other Ambulatory Visit: Payer: Self-pay | Admitting: Family Medicine

## 2016-09-11 DIAGNOSIS — R05 Cough: Secondary | ICD-10-CM

## 2016-09-11 DIAGNOSIS — R042 Hemoptysis: Secondary | ICD-10-CM

## 2016-09-11 DIAGNOSIS — R059 Cough, unspecified: Secondary | ICD-10-CM

## 2016-09-11 DIAGNOSIS — R197 Diarrhea, unspecified: Secondary | ICD-10-CM

## 2016-09-17 ENCOUNTER — Ambulatory Visit (HOSPITAL_COMMUNITY)
Admission: RE | Admit: 2016-09-17 | Discharge: 2016-09-17 | Disposition: A | Discharge status: Home / Self Care | Payer: PPO | Source: Ambulatory Visit | Attending: Cardiovascular Disease | Admitting: Cardiovascular Disease

## 2016-09-17 DIAGNOSIS — I2510 Atherosclerotic heart disease of native coronary artery without angina pectoris: Secondary | ICD-10-CM | POA: Insufficient documentation

## 2016-09-17 DIAGNOSIS — I6523 Occlusion and stenosis of bilateral carotid arteries: Secondary | ICD-10-CM | POA: Diagnosis not present

## 2016-09-17 DIAGNOSIS — E785 Hyperlipidemia, unspecified: Secondary | ICD-10-CM | POA: Diagnosis not present

## 2016-09-17 DIAGNOSIS — Z87891 Personal history of nicotine dependence: Secondary | ICD-10-CM | POA: Diagnosis not present

## 2016-09-17 DIAGNOSIS — I10 Essential (primary) hypertension: Secondary | ICD-10-CM | POA: Insufficient documentation

## 2016-09-25 ENCOUNTER — Telehealth: Payer: Self-pay | Admitting: *Deleted

## 2016-09-25 NOTE — Telephone Encounter (Signed)
WHILE   GIVING PT RESULTS  OF  CAROTID PT STATED DID NOT  FEEL WELL WENT  INTO  AFIB  THIS  AM AROUND   7:00 AM  HAS  SINCE  SLOWED  DOWN TO 88  AFTER  TAKING DILTIAZEM  .BUT  STILL FEELS  IRREGULAR  AND  B/P WAS 99/58 WILL  FORWARD TO DR Marlou Porch  FOR REVIEW .Adonis Housekeeper

## 2016-09-25 NOTE — Telephone Encounter (Signed)
Agree with current plan. Heart rate seems to be under good control currently. Try to drink fluids. Candee Furbish, MD

## 2016-09-26 ENCOUNTER — Telehealth: Payer: Self-pay | Admitting: Cardiology

## 2016-09-26 NOTE — Telephone Encounter (Signed)
LM TO CALL BACK ./CY 

## 2016-09-26 NOTE — Telephone Encounter (Signed)
New message     Patient calling back to speak with nurse from yesterday  - Went back into sinus rhythm @ am last night.  In Afib for about 12 hours.

## 2016-09-26 NOTE — Telephone Encounter (Signed)
Lm to cb.

## 2016-10-01 DIAGNOSIS — F28 Other psychotic disorder not due to a substance or known physiological condition: Secondary | ICD-10-CM | POA: Diagnosis not present

## 2016-10-03 DIAGNOSIS — L578 Other skin changes due to chronic exposure to nonionizing radiation: Secondary | ICD-10-CM | POA: Diagnosis not present

## 2016-10-04 NOTE — Telephone Encounter (Signed)
Pt has not returned any call/VM left for her.  Will await her return call if problems.

## 2016-10-22 ENCOUNTER — Other Ambulatory Visit (INDEPENDENT_AMBULATORY_CARE_PROVIDER_SITE_OTHER): Payer: PPO

## 2016-10-22 DIAGNOSIS — E785 Hyperlipidemia, unspecified: Secondary | ICD-10-CM

## 2016-10-22 LAB — LIPID PANEL
Cholesterol: 167 mg/dL (ref <200)
HDL: 48 mg/dL — ABNORMAL LOW (ref >50)
LDL CALC: 100 mg/dL — AB (ref <100)
TRIGLYCERIDES: 93 mg/dL (ref <150)
Total CHOL/HDL Ratio: 3.5 Ratio (ref <5.0)
VLDL: 19 mg/dL (ref <30)

## 2016-10-23 ENCOUNTER — Ambulatory Visit (INDEPENDENT_AMBULATORY_CARE_PROVIDER_SITE_OTHER): Payer: PPO | Admitting: Pharmacist

## 2016-10-23 VITALS — BP 122/62 | HR 68 | Ht 62.0 in | Wt 133.5 lb

## 2016-10-23 DIAGNOSIS — E785 Hyperlipidemia, unspecified: Secondary | ICD-10-CM

## 2016-10-23 MED ORDER — ROSUVASTATIN CALCIUM 10 MG PO TABS: ORAL_TABLET | ORAL | 11 refills | Status: DC

## 2016-10-23 NOTE — Patient Instructions (Addendum)
Increase your Crestor dose to 10mg  3 days a week.   Adjust your dose based on tolerability.  Recheck cholesterol in 2 months - Friday January 26th before visit with Dr Marlou Porch on the 29th. Come in any time after 7:30am for fasting labs.

## 2016-10-23 NOTE — Progress Notes (Signed)
Patient ID: MELENA DOVEY                 DOB: Oct 06, 1941                    MRN: [id number]     HPI: Kaitlyn Hoover is a 75 y.o. female patient referred to lipid clinic by Dr. Marlou Porch who presents today for follow up. PMH is significant for carotid artery disease, TIA (per patient), Afib, HTN, spinal stenosis, and post poliomyelitis syndrome. Carotid ultrasound from 09/17/16 showed stable 40-59% bilateral ICA stenosis and >50% RECA stenosis. At last OV with lipid clinic, she was started on rosuvastatin 5-10mg  once a week as tolerated. She presents today for lipid follow up.  Patient reports that her back pain is much better since she had surgery 3 months ago in September 2017. She has previously been intolerant to pravastatin, atorvastatin, and rosuvastatin but was not sure if her myalgias were due to her poliomyelitis syndrome and spinal stenosis or statin-induced. She has been taking Crestor 10mg  once weekly since her back surgery and LDL has only minimally improved from 109 to 100.   She is frustrated that she gained ~5 lbs since her back surgery. Her appetite increased after and her exercise was limited. She is getting back into Weight [employer] and would like to start swimming again if her back doctor clears her. Of note, she takes a high dose of fish oil 3g BID without history of elevated triglycerides. Pt reports her psychiatrist started her on this as a mood stabilizer.  Current Medications: Crestor 10mg  once weekly, omega-3 3g BID (for mood stabilization) Intolerances: pravastatin 20mg , atorvastatin 40mg , rosuvastatin 10mg  daily and 3x/week- worsening myalgias Risk Factors: TIA, carotid artery disease with 40-59% bilateral ICA stenosis and >50% RECA stenosis LDL goal: <70 mg/dL  Diet: fruits, skim milks, grain waffles, yogurt, vegetables, doesn't eat much meat, 1-2x week of frozen burger, doesn't eat out much (1x/week)  Exercise: Fairly limited due to severe muscle pains. Reports using  motorized shopping cart at stores. Used to swim, but stopped due to pain.   Family History: Includes HTN - mother, cancer - father, COPD/Afib - brother  Social History: Quit smoking. Does not have smokeless tobacco history on file.  Labs: 10/22/2016: TC 167, TG 93, HDL 48, LDL 100 (Crestor 10mg  once weekly and fish oil 3g BID) 07/16/2016: TC 173, TG 105, HDL 43, LDL 109 (fish oil 3g BID)  03/01/2016: TC 117, TG 95, HDL 44, LDL 54  (atorvastatin 40mg )  01/09/2016: TC 226, TG 167, HDL 41, LDL 152 (no therapy)   Past Medical History:  Diagnosis Date  . Anxiety   . Arthritis   . Atrial fibrillation with RVR ([place])   . Bipolar disorder ([place])   . Dysrhythmia   . GERD (gastroesophageal reflux disease)   . Hypertension   . Hypothyroidism   . Palpitations   . PAT (paroxysmal atrial tachycardia) ([place])   . Post-polio syndrome   . Sinus infection   . 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 acyclovir (ZOVIRAX) 400 MG tablet Take 400 mg by mouth daily.     Marland Kitchen alendronate (FOSAMAX) 70 MG tablet Take 70 mg by mouth once a week. Thursday Take with a full glass of water on an empty stomach.    Marland Kitchen amoxicillin-clavulanate (AUGMENTIN) 875-125 MG tablet Take 1 tablet by  mouth every 12 (twelve) hours.    Marland Kitchen apixaban (ELIQUIS) 5 MG TABS tablet Take 1 tablet (5 mg total) by mouth 2 (two) times daily. 180 tablet 1  . Ascorbic Acid (VITAMIN C) 1000 MG tablet Take 1,000 mg by mouth daily.    . Calcium Carbonate-Vitamin D (CALCIUM-CARB 600 + D PO) Take 2 tablets by mouth at bedtime.    . Coenzyme Q10 (CO Q-10) 100 MG CAPS Take 100 mg by mouth daily.     Marland Kitchen diltiazem (CARDIZEM CD) 180 MG 24 hr capsule Take 1 capsule (180 mg total) by mouth daily. 90 capsule 3  . FANAPT 2 MG TABS Take 3 mg by mouth at bedtime.     . fexofenadine (ALLEGRA) 180 MG tablet Take 180 mg by mouth daily.    . Ginkgo Biloba Extract 60 MG CAPS Take 240  mg by mouth daily.    Marland Kitchen levothyroxine (SYNTHROID, LEVOTHROID) 100 MCG tablet Take 100 mcg by mouth daily.    . Melatonin 3 MG TABS Take 3 mg by mouth at bedtime.    . Multiple Vitamin (MULTIVITAMIN) capsule Take 1 capsule by mouth 2 (two) times daily.    . Omega-3 Fatty Acids (FISH OIL BURP-LESS) 1000 MG CAPS Take 3,000 Units by mouth 2 (two) times daily.    Marland Kitchen omeprazole (PRILOSEC) 20 MG capsule Take 20 mg by mouth every morning.    Marland Kitchen oxybutynin (DITROPAN-XL) 10 MG 24 hr tablet Take 10 mg by mouth 2 (two) times daily.    Marland Kitchen oxyCODONE-acetaminophen (ROXICET) 5-325 MG tablet Take 1 tablet by mouth every 6 (six) hours as needed for severe pain. 80 tablet 0  . polyethylene glycol (MIRALAX / GLYCOLAX) packet Take 17 g by mouth at bedtime.    . rosuvastatin (CRESTOR) 10 MG tablet Take 1 tablet by mouth once a week as tolerated. 5 tablet 11  . temazepam (RESTORIL) 15 MG capsule Take 15 mg by mouth at bedtime as needed for sleep.    Marland Kitchen tiZANidine (ZANAFLEX) 4 MG tablet Take 1 tablet (4 mg total) by mouth every 6 (six) hours as needed for muscle spasms. 60 tablet 0  . traMADol (ULTRAM) 50 MG tablet Take 1 tablet (50 mg total) by mouth 3 (three) times daily as needed. (Patient taking differently: Take 50 mg by mouth 2 (two) times daily. ) 90 tablet 5   No current facility-administered medications on file prior to visit.     Allergies  Allergen Reactions  . Sulfa Antibiotics Rash    Childhood allergy  . Ciprocinonide [Fluocinolone] Rash    Assessment/Plan:  1. Hyperlipidemia - LDL slightly improved to 100mg /dL since starting Crestor 10mg  once weekly, but still above goal LDL < 70mg /dL given history of CAD and TIA. Pt's back pain has greatly improved following her back surgery in September. Cause of myalgias was likely multifactorial and due to statins + poliomyelitis syndrome and spinal stenosis. Pt is willing to increase Crestor to 10mg  3 days a week with directions to self-titrate dose up to 10mg   daily as tolerated. Will recheck lipid panel in 2 months prior to next OV with Dr Marlou Porch.  Patient signed informed consent for [facility] lipid registry.  Kaitlyn Hoover, PharmD, CPP, [place] [id number] [place], [place] [address] Phone: [phone]; Fax: [phone] 10/23/2016 12:01 PM

## 2016-10-25 ENCOUNTER — Other Ambulatory Visit: Payer: Self-pay | Admitting: Family Medicine

## 2016-10-25 DIAGNOSIS — Z1231 Encounter for screening mammogram for malignant neoplasm of breast: Secondary | ICD-10-CM

## 2016-11-01 ENCOUNTER — Encounter: Payer: Self-pay | Admitting: *Deleted

## 2016-11-01 DIAGNOSIS — Z006 Encounter for examination for normal comparison and control in clinical research program: Secondary | ICD-10-CM

## 2016-11-01 NOTE — Progress Notes (Signed)
Late entry: Subject met inclusion and exclusion criteria. The informed consent form, study requirements and expectations were reviewed with the subject and questions and concerns were addressed prior to the signing of the consent form. The subject verbalized understanding of the trail requirements. The subject agreed to participate in the [place] Registryand signed the informed consent. The informed consent was obtained prior to performance of any protocol-specific procedures for the subject. A copy of the signed informed consent was given to the subject and a copy was placed in the subject's medical record.  Jake Bathe, RN 10/23/2016

## 2016-12-03 ENCOUNTER — Ambulatory Visit
Admission: RE | Admit: 2016-12-03 | Discharge: 2016-12-03 | Disposition: A | Discharge status: Home / Self Care | Payer: PPO | Source: Ambulatory Visit | Attending: Family Medicine | Admitting: Family Medicine

## 2016-12-03 DIAGNOSIS — Z1231 Encounter for screening mammogram for malignant neoplasm of breast: Secondary | ICD-10-CM

## 2016-12-13 ENCOUNTER — Other Ambulatory Visit: Payer: PPO | Admitting: *Deleted

## 2016-12-13 DIAGNOSIS — A084 Viral intestinal infection, unspecified: Secondary | ICD-10-CM | POA: Diagnosis not present

## 2016-12-13 DIAGNOSIS — E785 Hyperlipidemia, unspecified: Secondary | ICD-10-CM

## 2016-12-13 NOTE — Addendum Note (Signed)
Addended by: Eulis Foster on: 12/13/2016 08:48 AM   Modules accepted: Orders

## 2016-12-14 LAB — LIPID PANEL
CHOL/HDL RATIO: 3.4 ratio (ref 0.0–4.4)
Cholesterol, Total: 151 mg/dL (ref 100–199)
HDL: 44 mg/dL (ref >39)
LDL Calculated: 85 mg/dL (ref 0–99)
TRIGLYCERIDES: 112 mg/dL (ref 0–149)
VLDL CHOLESTEROL CAL: 22 mg/dL (ref 5–40)

## 2016-12-16 ENCOUNTER — Encounter: Payer: Self-pay | Admitting: Cardiology

## 2016-12-16 ENCOUNTER — Ambulatory Visit (INDEPENDENT_AMBULATORY_CARE_PROVIDER_SITE_OTHER): Payer: PPO | Admitting: Cardiology

## 2016-12-16 VITALS — BP 124/70 | HR 68 | Ht 62.0 in | Wt 135.0 lb

## 2016-12-16 DIAGNOSIS — Z789 Other specified health status: Secondary | ICD-10-CM

## 2016-12-16 DIAGNOSIS — Z7901 Long term (current) use of anticoagulants: Secondary | ICD-10-CM

## 2016-12-16 DIAGNOSIS — I480 Paroxysmal atrial fibrillation: Secondary | ICD-10-CM

## 2016-12-16 DIAGNOSIS — E7800 Pure hypercholesterolemia, unspecified: Secondary | ICD-10-CM

## 2016-12-16 NOTE — Patient Instructions (Signed)

## 2016-12-16 NOTE — Progress Notes (Signed)
[facility]. [address] [place], [place]  [address] Phone: [phone] Fax:  [phone]  Date:  12/17/2016   ID:  Kaitlyn Hoover, DOB [birth year], MRN [id number]  PCP:  Jonathon Bellows, MD   History of Present Illness: Kaitlyn Hoover is a 76 y.o. female "Kaitlyn Hoover" here for atrial fibrillation with recent hospitalization, discharged on 04/19/14. She was initially started on Cardizem drip with spontaneous conversion to sinus rhythm. Lab work was normal. AFIB was 133. Symptomatic. She is a Marine scientist.   She had approximate 5 episodes of atrial fibrillation since she was first diagnosed. She's been to the emergency room twice for this.  Her brother was diagnosed as well with atrial fibrillation and diltiazem seems to working well for him.  Usually has SVT in the past which can be terminated with Valsalva.   AFIB was not prompted by any fevers, recent surgeries, thyroid issues. She has one glass of wine per night.  After she converted, she felt well.  Had another episode prompting ER visit 08/15/14-extra dose of diltiazem, sent home.  When asked if she had a history of coronary artery disease, she denies. This was listed in her past medical history.  Since putting her on diltiazem 180 mg, she has been doing quite well. She did have one episode which lasted less than 12 hours. During this episode, her heart rate was not quite as fast as it had been in the past. No bleeding, no syncope.  03/03/15 - last Oct 26 2014 last episode. Good on Diltaizem.   09/04/15 - doing really well. No episodes. Occasionally will feel SVT, Valsalva. No chest pain. She reminded me that one episode in the hospital was not paid for by her insurance. Used to work for home health.   12/16/16 - had episode in November, took extra dilt 180 in bed. 12 hours converted no chest pain, no shortness of breath, no syncope, no dizziness. She has been tolerating Crestor 10 3 times a week. Doing well..   Wt Readings from Last 3  Encounters:  12/16/16 135 lb (61.2 kg)  10/23/16 133 lb 8 oz (60.6 kg)  09/05/16 130 lb (59 kg)     Past Medical History:  Diagnosis Date  . Anxiety   . Arthritis   . Atrial fibrillation with RVR ([place])   . Bipolar disorder ([place])   . Dysrhythmia   . GERD (gastroesophageal reflux disease)   . Hypertension   . Hypothyroidism   . Palpitations   . PAT (paroxysmal atrial tachycardia) ([place])   . Post-polio syndrome   . Sinus infection   . 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 ABDOMINAL HYSTERECTOMY    . CATARACT EXTRACTION W/ INTRAOCULAR LENS  IMPLANT, BILATERAL    . CERVICAL LAMINECTOMY    . CESAREAN SECTION     X2   . LEGS      AGE 51-11   SEVERAL SURGERIES FOR POLIO   . THYROIDECTOMY      Current Outpatient Prescriptions  Medication Sig Dispense Refill  . acyclovir (ZOVIRAX) 400 MG tablet Take 400 mg by mouth daily.     Marland Kitchen alendronate (FOSAMAX) 70 MG tablet Take 70 mg by mouth once a week. Thursday Take with a full glass of water on an empty stomach.    Marland Kitchen apixaban (ELIQUIS) 5 MG TABS tablet Take 1 tablet (5 mg total) by mouth  2 (two) times daily. 180 tablet 1  . Ascorbic Acid (VITAMIN C) 1000 MG tablet Take 1,000 mg by mouth daily.    . Calcium Carbonate-Vitamin D (CALCIUM-CARB 600 + D PO) Take 2 tablets by mouth at bedtime.    . Coenzyme Q10 (CO Q-10) 100 MG CAPS Take 100 mg by mouth daily.     Marland Kitchen diltiazem (CARDIZEM CD) 180 MG 24 hr capsule Take 1 capsule (180 mg total) by mouth daily. 90 capsule 3  . FANAPT 4 MG TABS tablet Take 4 mg by mouth daily.    . fexofenadine (ALLEGRA) 180 MG tablet Take 180 mg by mouth daily.    . Ginkgo Biloba Extract 60 MG CAPS Take 240 mg by mouth daily.    Marland Kitchen levothyroxine (SYNTHROID, LEVOTHROID) 100 MCG tablet Take 100 mcg by mouth daily.    . Melatonin 3 MG TABS Take 3 mg by mouth at bedtime.    . Multiple Vitamin (MULTIVITAMIN) capsule Take 1 capsule by mouth 2 (two) times daily.    .  Omega-3 Fatty Acids (FISH OIL BURP-LESS) 1000 MG CAPS Take 3,000 Units by mouth 2 (two) times daily.    Marland Kitchen omeprazole (PRILOSEC) 20 MG capsule Take 20 mg by mouth every morning.    Marland Kitchen oxybutynin (DITROPAN-XL) 10 MG 24 hr tablet Take 10 mg by mouth 2 (two) times daily.    Marland Kitchen oxyCODONE-acetaminophen (ROXICET) 5-325 MG tablet Take 1 tablet by mouth every 6 (six) hours as needed for severe pain. 80 tablet 0  . polyethylene glycol (MIRALAX / GLYCOLAX) packet Take 17 g by mouth at bedtime.    . rosuvastatin (CRESTOR) 10 MG tablet Take 1 tablet by mouth daily as tolerated. 30 tablet 11  . temazepam (RESTORIL) 15 MG capsule Take 15 mg by mouth at bedtime as needed for sleep.    Marland Kitchen tiZANidine (ZANAFLEX) 4 MG tablet Take 1 tablet (4 mg total) by mouth every 6 (six) hours as needed for muscle spasms. 60 tablet 0  . traMADol (ULTRAM) 50 MG tablet Take 1 tablet (50 mg total) by mouth 3 (three) times daily as needed. (Patient taking differently: Take 50 mg by mouth 2 (two) times daily. ) 90 tablet 5   No current facility-administered medications for this visit.     Allergies:    Allergies  Allergen Reactions  . Sulfa Antibiotics Rash    Childhood allergy  . Ciprocinonide [Fluocinolone] Rash    Social History:  The patient  reports that she has quit smoking. She has never used smokeless tobacco. She reports that she drinks alcohol. She reports that she does not use drugs.   Family History  Problem Relation Age of Onset  . Hypertension Mother   . Cancer Father     JAW BONE  . COPD Brother   . Atrial fibrillation Brother   Mother died in 38's from accident. Has HTN.   ROS:  Please see the history of present illness.      All other systems reviewed and negative.   PHYSICAL EXAM: VS:  BP 124/70   Pulse 68   Ht 5\' 2"  (1.575 m)   Wt 135 lb (61.2 kg)   LMP  (LMP Unknown)   BMI 24.69 kg/m  Well nourished, thin in no acute distress  HEENT: normal, [place]/AT, EOMI, hearing aids, mild speech  impairment Neck: no JVD, normal carotid upstroke, no bruit Cardiac:  normal S1, S2; RRR; no murmur  Lungs:  clear to auscultation bilaterally, no wheezing, rhonchi or  rales  Abd: soft, nontender, no hepatomegaly, no bruits  Ext: no edema, 2+ distal pulses Skin: warm and dry  GU: deferred Neuro: no focal abnormalities noted, AAO x 3  EKG: 12/16/16-sinus rhythm 68, nonspecific ST-T wave changes, sinus arrhythmia. Personally viewed-prior 04/18/14 at 10:18 AM, sinus bradycardia rate 57, no other abnormalities.    ECHO: 04/18/14 - Left ventricle: The cavity size was normal. Wall thickness was normal. Systolic function was normal. The estimated ejection fraction was in the range of 60% to 65%. Wall motion was normal; there were no regional wall motion abnormalities. - Left atrium: The atrium was mildly dilated. - Atrial septum: No defect or patent foramen ovale was identified. - Pulmonary arteries: PA peak pressure: 32 mm Hg (S).  Labs: 04/17/14-TSH normal, creatinine 0.79. Prior medical records reviewed, ER visit reviewed.   ASSESSMENT AND PLAN:  1. Paroxysmal atrial fibrillation- She is doing very well. Last episode December 2015. Diltiazem. CHADS-VASc at least 3 (Female, AGE, HTN).  Eliquis. Stopped Plavix previously. She is symptomatic with her episodes. Her brother, was diagnosed with atrial fibrillation around the same time. He seems to be doing very well with diltiazem, thus we transitioned her previously from atenolol to diltiazem (stopped her atenolol 50 mg twice a day). She is only drinking a glass of wine nightly. I have answered several questions for her. Continue her anticoagulation.  Echocardiogram with mild left atrial enlargement. Overall reassuring. I prescribed her diltiazem 60 mg by mouth to be taken when necessary if necessary atrial fibrillation. Next step would be to use flecainide. Please let us know. She is not interested at this time adding flecainide. Overall she's doing very  well. She likes the diltiazem. She is only having episodes approximate twice a year. I don't think we need to broach antiarrhythmic therapy just yet. 2. Essential hypertension-currently well controlled.  3. Chronic anticoagulation- Eliquis. Cost worry. Filling out patient assistance form. Creatinine, hemoglobin previously normal. Blood work with Dr. Justin Mend 4. Carotid artery stenosis notes that she has had test before which showed moderate amount of right carotid artery plaque. Carotid.  5. Statin intolerance - On 12/13/16, LDL 85 taking Crestor 10 mg 3 times a week.Marland Kitchen Appreciate lipid clinic. She seems to be doing well on this regimen 6. 3-month follow-up  Signed, Candee Furbish, MD [facility]  12/17/2016 6:49 AM

## 2016-12-17 ENCOUNTER — Ambulatory Visit (INDEPENDENT_AMBULATORY_CARE_PROVIDER_SITE_OTHER): Payer: PPO

## 2016-12-17 ENCOUNTER — Other Ambulatory Visit: Payer: Self-pay | Admitting: Neurosurgery

## 2016-12-17 ENCOUNTER — Telehealth: Payer: Self-pay | Admitting: Pharmacist

## 2016-12-17 DIAGNOSIS — M4316 Spondylolisthesis, lumbar region: Secondary | ICD-10-CM | POA: Diagnosis not present

## 2016-12-17 DIAGNOSIS — R030 Elevated blood-pressure reading, without diagnosis of hypertension: Secondary | ICD-10-CM | POA: Diagnosis not present

## 2016-12-17 DIAGNOSIS — M545 Low back pain: Secondary | ICD-10-CM | POA: Diagnosis not present

## 2016-12-17 DIAGNOSIS — M4726 Other spondylosis with radiculopathy, lumbar region: Secondary | ICD-10-CM | POA: Diagnosis not present

## 2016-12-17 DIAGNOSIS — Z6825 Body mass index (BMI) 25.0-25.9, adult: Secondary | ICD-10-CM | POA: Diagnosis not present

## 2016-12-17 NOTE — Addendum Note (Signed)
Addended by: Shellia Cleverly on: 12/17/2016 01:30 PM   Modules accepted: Orders

## 2016-12-17 NOTE — Telephone Encounter (Signed)
Reviewed lipid results with pt. She is tolerating Crestor 10mg  3 days a week with no side effects. LDL has improved from 100 to 85mg /dL but is still above goal 70mg /dL. Pt agreeable to increase Crestor to 4-5x per week as tolerated. She will call with tolerability in a few months.

## 2016-12-19 DIAGNOSIS — H35372 Puckering of macula, left eye: Secondary | ICD-10-CM | POA: Diagnosis not present

## 2016-12-31 DIAGNOSIS — F28 Other psychotic disorder not due to a substance or known physiological condition: Secondary | ICD-10-CM | POA: Diagnosis not present

## 2017-01-08 DIAGNOSIS — E785 Hyperlipidemia, unspecified: Secondary | ICD-10-CM | POA: Diagnosis not present

## 2017-01-08 DIAGNOSIS — I4891 Unspecified atrial fibrillation: Secondary | ICD-10-CM | POA: Diagnosis not present

## 2017-01-08 DIAGNOSIS — I10 Essential (primary) hypertension: Secondary | ICD-10-CM | POA: Diagnosis not present

## 2017-01-08 DIAGNOSIS — Z0000 Encounter for general adult medical examination without abnormal findings: Secondary | ICD-10-CM | POA: Diagnosis not present

## 2017-01-08 DIAGNOSIS — E039 Hypothyroidism, unspecified: Secondary | ICD-10-CM | POA: Diagnosis not present

## 2017-02-03 DIAGNOSIS — J300 Vasomotor rhinitis: Secondary | ICD-10-CM | POA: Diagnosis not present

## 2017-02-03 DIAGNOSIS — J309 Allergic rhinitis, unspecified: Secondary | ICD-10-CM | POA: Diagnosis not present

## 2017-02-06 ENCOUNTER — Telehealth: Payer: Self-pay | Admitting: *Deleted

## 2017-02-06 DIAGNOSIS — E7800 Pure hypercholesterolemia, unspecified: Secondary | ICD-10-CM

## 2017-02-06 NOTE — Telephone Encounter (Signed)
Pt called to schedule lipid follow up appt.

## 2017-02-12 ENCOUNTER — Other Ambulatory Visit: Payer: PPO | Admitting: *Deleted

## 2017-02-12 DIAGNOSIS — E7800 Pure hypercholesterolemia, unspecified: Secondary | ICD-10-CM | POA: Diagnosis not present

## 2017-02-12 LAB — LIPID PANEL
CHOL/HDL RATIO: 3 ratio (ref 0.0–4.4)
CHOLESTEROL TOTAL: 136 mg/dL (ref 100–199)
HDL: 46 mg/dL (ref >39)
LDL CALC: 67 mg/dL (ref 0–99)
TRIGLYCERIDES: 114 mg/dL (ref 0–149)
VLDL Cholesterol Cal: 23 mg/dL (ref 5–40)

## 2017-02-12 LAB — HEPATIC FUNCTION PANEL
ALT: 12 IU/L (ref 0–32)
AST: 22 IU/L (ref 0–40)
Albumin: 4 g/dL (ref 3.5–4.8)
Alkaline Phosphatase: 87 IU/L (ref 39–117)
BILIRUBIN TOTAL: 0.4 mg/dL (ref 0.0–1.2)
BILIRUBIN, DIRECT: 0.13 mg/dL (ref 0.00–0.40)
Total Protein: 7.2 g/dL (ref 6.0–8.5)

## 2017-02-12 NOTE — Addendum Note (Signed)
Addended by: Eulis Foster on: 02/12/2017 10:13 AM   Modules accepted: Orders

## 2017-02-17 ENCOUNTER — Ambulatory Visit (INDEPENDENT_AMBULATORY_CARE_PROVIDER_SITE_OTHER): Payer: PPO | Admitting: Pharmacist

## 2017-02-17 DIAGNOSIS — E7800 Pure hypercholesterolemia, unspecified: Secondary | ICD-10-CM | POA: Diagnosis not present

## 2017-02-17 NOTE — Patient Instructions (Addendum)
LDL was at GOAL on most recent check.   Continue Crestor 10mg  4 times a week. Follow up with Dr. Marlou Porch as indicated.   Cholesterol Cholesterol is a white, waxy, fat-like substance that is needed by the human body in small amounts. The liver makes all the cholesterol we need. Cholesterol is carried from the liver by the blood through the blood vessels. Deposits of cholesterol (plaques) may build up on blood vessel (artery) walls. Plaques make the arteries narrower and stiffer. Cholesterol plaques increase the risk for heart attack and stroke. You cannot feel your cholesterol level even if it is very high. The only way to know that it is high is to have a blood test. Once you know your cholesterol levels, you should keep a record of the test results. Work with your health care provider to keep your levels in the desired range. What do the results mean?  Total cholesterol is a rough measure of all the cholesterol in your blood.  LDL (low-density lipoprotein) is the "bad" cholesterol. This is the type that causes plaque to build up on the artery walls. You want this level to be low.  HDL (high-density lipoprotein) is the "good" cholesterol because it cleans the arteries and carries the LDL away. You want this level to be high.  Triglycerides are fat that the body can either burn for energy or store. High levels are closely linked to heart disease. What are the desired levels of cholesterol?  Total cholesterol below 200.  LDL below 100 for people who are at risk, below 70 for people at very high risk.  HDL above 40 is good. A level of 60 or higher is considered to be protective against heart disease.  Triglycerides below 150. How can I lower my cholesterol? Diet  Follow your diet program as told by your health care provider.  Choose fish or white meat chicken and [place], roasted or baked. Limit fatty cuts of red meat, fried foods, and processed meats, such as sausage and lunch meats.  Eat  lots of fresh fruits and vegetables.  Choose whole grains, beans, pasta, potatoes, and cereals.  Choose olive oil, corn oil, or canola oil, and use only small amounts.  Avoid butter, mayonnaise, shortening, or palm kernel oils.  Avoid foods with trans fats.  Drink skim or nonfat milk and eat low-fat or nonfat yogurt and cheeses. Avoid whole milk, cream, ice cream, egg yolks, and full-fat cheeses.  Healthier desserts include angel food cake, ginger snaps, animal crackers, hard candy, popsicles, and low-fat or nonfat frozen yogurt. Avoid pastries, cakes, pies, and cookies. Exercise  Follow your exercise program as told by your health care provider. A regular program:  Helps to decrease LDL and raise HDL.  Helps with weight control.  Do things that increase your activity level, such as gardening, walking, and taking the stairs.  Ask your health care provider about ways that you can be more active in your daily life. Medicine  Take over-the-counter and prescription medicines only as told by your health care provider.  Medicine may be prescribed by your health care provider to help lower cholesterol and decrease the risk for heart disease. This is usually done if diet and exercise have failed to bring down cholesterol levels.  If you have several risk factors, you may need medicine even if your levels are normal. This information is not intended to replace advice given to you by your health care provider. Make sure you discuss any questions you have  with your health care provider. Document Released: 07/30/2001 Document Revised: 06/01/2016 Document Reviewed: 05/04/2016 Elsevier Interactive Patient Education  2017 [employer].

## 2017-02-17 NOTE — Progress Notes (Signed)
Patient ID: Kaitlyn Hoover                 DOB: [birth year]                    MRN: [id number]     HPI: Kaitlyn Hoover is a 76 y.o. female patient of Dr. Marlou Porch that presents today for lipid follow up. PMH is significant for carotid artery disease, TIA (per patient), Afib, HTN, spinal stenosis, and post poliomyelitis syndrome. Carotid ultrasound from 09/17/16 showed stable 40-59% bilateral ICA stenosis and >50% RECA stenosis. At her most recent OV in lipid clinic her Crestor was increased from 10mg  once weekly to 3times weekly. She did report increased back pain that was thought to be multifactoral as she had just had back surgery. She presents today for follow up.   She states she has been doing well on rosuvastatin 10mg  four times weekly. She denies muscle aching.    Current Medications: Crestor 10mg  once weekly, omega-3 3g BID (for mood stabilization) Intolerances: pravastatin 20mg , atorvastatin 40mg , rosuvastatin 10mg  daily and 3x/week- worsening myalgias Risk Factors: TIA, carotid artery disease with 40-59% bilateral ICA stenosis and >50% RECA stenosis LDL goal: <70 mg/dL  Diet: fruits, skim milks, grain waffles, yogurt, vegetables, doesn't eat much meat, 1-2x week of frozen burger, doesn't eat out much (1x/week)  Exercise: Fairly limited due to severe muscle pains. Reports using motorized shopping cart at stores. Used to swim, but stopped due to pain.   Family History: Includes HTN - mother, cancer - father, COPD/Afib - brother  Social History: Quit smoking. Does not have smokeless tobacco history on file.  Labs: 02/12/17:  TC 136, TG 114, HDL 46, LDL 67 (Crestor 10mg  three times weekly and fish oil 3g BID) 10/22/2016: TC 167, TG 93, HDL 48, LDL 100 (Crestor 10mg  once weekly and fish oil 3g BID) 07/16/2016: TC 173, TG 105, HDL 43, LDL 109 (fish oil 3g BID)  03/01/2016: TC 117, TG 95, HDL 44, LDL 54  (atorvastatin 40mg )  01/09/2016: TC 226, TG 167, HDL 41, LDL 152 (no therapy)    Past Medical History:  Diagnosis Date  . Anxiety   . Arthritis   . Atrial fibrillation with RVR ([place])   . Bipolar disorder ([place])   . Dysrhythmia   . GERD (gastroesophageal reflux disease)   . Hypertension   . Hypothyroidism   . Palpitations   . PAT (paroxysmal atrial tachycardia) ([place])   . Post-polio syndrome   . Sinus infection   . 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 acyclovir (ZOVIRAX) 400 MG tablet Take 400 mg by mouth daily.     Marland Kitchen alendronate (FOSAMAX) 70 MG tablet Take 70 mg by mouth once a week. Thursday Take with a full glass of water on an empty stomach.    Marland Kitchen apixaban (ELIQUIS) 5 MG TABS tablet Take 1 tablet (5 mg total) by mouth 2 (two) times daily. 180 tablet 1  . Ascorbic Acid (VITAMIN C) 1000 MG tablet Take 1,000 mg by mouth daily.    . Calcium Carbonate-Vitamin D (CALCIUM-CARB 600 + D PO) Take 2 tablets by mouth at bedtime.    . Coenzyme Q10 (CO Q-10) 100 MG CAPS Take 100 mg by mouth daily.     Marland Kitchen diltiazem (CARDIZEM CD) 180 MG 24 hr capsule Take 1 capsule (180 mg total) by mouth  daily. 90 capsule 3  . FANAPT 4 MG TABS tablet Take 4 mg by mouth daily.    . fexofenadine (ALLEGRA) 180 MG tablet Take 180 mg by mouth daily.    . Ginkgo Biloba Extract 60 MG CAPS Take 240 mg by mouth daily.    Marland Kitchen levothyroxine (SYNTHROID, LEVOTHROID) 100 MCG tablet Take 100 mcg by mouth daily.    . Melatonin 3 MG TABS Take 3 mg by mouth at bedtime.    . Multiple Vitamin (MULTIVITAMIN) capsule Take 1 capsule by mouth 2 (two) times daily.    . Omega-3 Fatty Acids (FISH OIL BURP-LESS) 1000 MG CAPS Take 3,000 Units by mouth 2 (two) times daily.    Marland Kitchen omeprazole (PRILOSEC) 20 MG capsule Take 20 mg by mouth every morning.    Marland Kitchen oxybutynin (DITROPAN-XL) 10 MG 24 hr tablet Take 10 mg by mouth 2 (two) times daily.    Marland Kitchen oxyCODONE-acetaminophen (ROXICET) 5-325 MG tablet Take 1 tablet by mouth every 6 (six) hours  as needed for severe pain. 80 tablet 0  . polyethylene glycol (MIRALAX / GLYCOLAX) packet Take 17 g by mouth at bedtime.    . rosuvastatin (CRESTOR) 10 MG tablet Take 1 tablet by mouth daily as tolerated. 30 tablet 11  . temazepam (RESTORIL) 15 MG capsule Take 15 mg by mouth at bedtime as needed for sleep.    Marland Kitchen tiZANidine (ZANAFLEX) 4 MG tablet Take 1 tablet (4 mg total) by mouth every 6 (six) hours as needed for muscle spasms. 60 tablet 0  . traMADol (ULTRAM) 50 MG tablet Take 1 tablet (50 mg total) by mouth 3 (three) times daily as needed. (Patient taking differently: Take 50 mg by mouth 2 (two) times daily. ) 90 tablet 5   No current facility-administered medications on file prior to visit.     Allergies  Allergen Reactions  . Sulfa Antibiotics Rash    Childhood allergy  . Ciprocinonide [Fluocinolone] Rash    Assessment/Plan: Hyperlipidemia: LDL now at goal <70 on Crestor 4 times weekly. Continue current therapy and increase as tolerated. Follow up with Dr. Marlou Porch as scheduled in July.   Thank you,  Lelan Pons. Patterson Hammersmith, [facility]  02/17/2017 7:12 AM

## 2017-03-05 DIAGNOSIS — J310 Chronic rhinitis: Secondary | ICD-10-CM | POA: Diagnosis not present

## 2017-03-05 DIAGNOSIS — J343 Hypertrophy of nasal turbinates: Secondary | ICD-10-CM | POA: Diagnosis not present

## 2017-03-05 DIAGNOSIS — J342 Deviated nasal septum: Secondary | ICD-10-CM | POA: Diagnosis not present

## 2017-03-11 ENCOUNTER — Encounter: Payer: Self-pay | Admitting: Cardiology

## 2017-03-11 ENCOUNTER — Ambulatory Visit (INDEPENDENT_AMBULATORY_CARE_PROVIDER_SITE_OTHER): Payer: PPO | Admitting: Cardiology

## 2017-03-11 VITALS — BP 138/68 | HR 62 | Ht 62.0 in | Wt 137.0 lb

## 2017-03-11 DIAGNOSIS — I480 Paroxysmal atrial fibrillation: Secondary | ICD-10-CM

## 2017-03-11 DIAGNOSIS — E785 Hyperlipidemia, unspecified: Secondary | ICD-10-CM | POA: Diagnosis not present

## 2017-03-11 DIAGNOSIS — I10 Essential (primary) hypertension: Secondary | ICD-10-CM | POA: Diagnosis not present

## 2017-03-11 NOTE — Progress Notes (Signed)
[name]. [address], [place]  [address] Phone: [phone] Fax:  [phone]  Date:  03/11/2017   ID:  Kaitlyn CRAIGO, DOB 1941/11/13, MRN [id number]  PCP:  Jonathon Bellows, MD   History of Present Illness: Kaitlyn Hoover is a 76 y.o. female "Kaitlyn Hoover" here for atrial fibrillation with recent hospitalization, discharged on 04/19/14. She was initially started on Cardizem drip with spontaneous conversion to sinus rhythm. Lab work was normal. AFIB was 133. Symptomatic. She is a Marine scientist.   She had approximate 5 episodes of atrial fibrillation since she was first diagnosed. She's been to the emergency room twice for this.  Her brother was diagnosed as well with atrial fibrillation and diltiazem seems to working well for him.  Usually has SVT in the past which can be terminated with Valsalva.   AFIB was not prompted by any fevers, recent surgeries, thyroid issues. She has one glass of wine per night.  After she converted, she felt well.  Had another episode prompting ER visit 08/15/14-extra dose of diltiazem, sent home.  When asked if she had a history of coronary artery disease, she denies. This was listed in her past medical history.  Since putting her on diltiazem 180 mg, she has been doing quite well. She did have one episode which lasted less than 12 hours. During this episode, her heart rate was not quite as fast as it had been in the past. No bleeding, no syncope.  03/03/15 - last Oct 26 2014 last episode. Good on Diltaizem.   09/04/15 - doing really well. No episodes. Occasionally will feel SVT, Valsalva. No chest pain. She reminded me that one episode in the hospital was not paid for by her insurance. Used to work for home health.   12/16/16 - had episode in November, took extra dilt 180 in bed. 12 hours converted no chest pain, no shortness of breath, no syncope, no dizziness. She has been tolerating Crestor 10 3 times a week. Doing well.  03/11/17-she has been able  to tolerate Crestor 4 times a week. Excellent LDL less than 70 now. Really appreciate lipid clinic. She has had more frequent episodes of atrial fibrillation. On Friday she had an episode where her heart rate was measured at 193 bpm and she felt dizzy but over the next 10-15 minutes this decreased to the 120 range. Finally after proximally 8 hours her atrial fibrillation broke. Denies chest pain, syncope, bleeding, orthopnea, PND. No bleeding with anticoagulation.   Wt Readings from Last 3 Encounters:  03/11/17 137 lb (62.1 kg)  12/16/16 135 lb (61.2 kg)  10/23/16 133 lb 8 oz (60.6 kg)     Past Medical History:  Diagnosis Date  . Anxiety   . Arthritis   . Atrial fibrillation with RVR ([place])   . Bipolar disorder ([place])   . Dysrhythmia   . GERD (gastroesophageal reflux disease)   . Hypertension   . Hypothyroidism   . Palpitations   . PAT (paroxysmal atrial tachycardia) ([place])   . Post-polio syndrome   . Sinus infection   . 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 ABDOMINAL HYSTERECTOMY    . CATARACT EXTRACTION W/ INTRAOCULAR LENS  IMPLANT, BILATERAL    . CERVICAL LAMINECTOMY    . CESAREAN SECTION     X2   . LEGS      AGE 33-11  SEVERAL SURGERIES FOR POLIO   . THYROIDECTOMY      Current Outpatient Prescriptions  Medication Sig Dispense Refill  . acyclovir (ZOVIRAX) 400 MG tablet Take 400 mg by mouth daily.     Marland Kitchen alendronate (FOSAMAX) 70 MG tablet Take 70 mg by mouth once a week. Take with a full glass of water on an empty stomach.    . Alendronate-Cholecalciferol (FOSAMAX PLUS D PO) Take by mouth.    Marland Kitchen apixaban (ELIQUIS) 5 MG TABS tablet Take 1 tablet (5 mg total) by mouth 2 (two) times daily. 180 tablet 1  . Ascorbic Acid (VITAMIN C) 1000 MG tablet Take 1,000 mg by mouth daily.    Marland Kitchen atenolol (TENORMIN) 25 MG tablet Take 1 tablet by mouth as needed. Irregular heartbeat    . calcium carbonate (OSCAL) 1500 (600 Ca) MG TABS tablet  Take 2 tablets by mouth daily.    . Coenzyme Q10 (CO Q-10) 100 MG CAPS Take 100 mg by mouth daily.     Marland Kitchen diltiazem (CARDIZEM CD) 180 MG 24 hr capsule Take 1 capsule (180 mg total) by mouth daily. 90 capsule 3  . FANAPT 4 MG TABS tablet Take 1 mg by mouth daily.     . Ginkgo Biloba Extract 60 MG CAPS Take 240 mg by mouth daily.    Marland Kitchen levothyroxine (SYNTHROID, LEVOTHROID) 100 MCG tablet Take 100 mcg by mouth daily.    . Melatonin 3 MG TABS Take 3 mg by mouth at bedtime.    . Multiple Vitamin (MULTIVITAMIN) capsule Take 1 capsule by mouth 2 (two) times daily.    . Omega-3 Fatty Acids (FISH OIL BURP-LESS) 1000 MG CAPS Take 3,000 Units by mouth 2 (two) times daily.    Marland Kitchen omeprazole (PRILOSEC) 20 MG capsule Take 20 mg by mouth every morning.    Marland Kitchen oxybutynin (DITROPAN-XL) 10 MG 24 hr tablet Take 10 mg by mouth 2 (two) times daily.    . polyethylene glycol (MIRALAX / GLYCOLAX) packet Take 17 g by mouth at bedtime.    . rosuvastatin (CRESTOR) 10 MG tablet Take 10 mg by mouth as directed. Take one tablet up to 4 times weekly    . temazepam (RESTORIL) 15 MG capsule Take 15 mg by mouth at bedtime as needed for sleep.    . ziprasidone (GEODON) 20 MG capsule Take 20 mg by mouth daily.     No current facility-administered medications for this visit.     Allergies:    Allergies  Allergen Reactions  . Sulfa Antibiotics Rash    Childhood allergy  . Ciprocinonide [Fluocinolone] Rash    Social History:  The patient  reports that she has quit smoking. She has never used smokeless tobacco. She reports that she drinks alcohol. She reports that she does not use drugs.   Family History  Problem Relation Age of Onset  . Hypertension Mother   . Cancer Father     JAW BONE  . COPD Brother   . Atrial fibrillation Brother   Mother died in 54's from accident. Has HTN.   ROS:  Please see the history of present illness.      Unless stated above all other review of systems negative. PHYSICAL EXAM: VS:  BP 138/68    Pulse 62   Ht 5\' 2"  (1.575 m)   Wt 137 lb (62.1 kg)   LMP  (LMP Unknown)   BMI 25.06 kg/m  GEN: Thin, well developed, in no acute distress  HEENT:  thyroid scar  noted Neck: no JVD, carotid bruits, or masses Cardiac: RRR; no murmurs, rubs, or gallops,no edema  Respiratory:  clear to auscultation bilaterally, normal work of breathing GI: soft, nontender, nondistended, + BS MS: no deformity or atrophy  Skin: warm and dry, no rash Neuro:  Alert and Oriented x 3, Strength and sensation are intact Psych: euthymic mood, full affect   EKG: 12/16/16-sinus rhythm 68, nonspecific ST-T wave changes, sinus arrhythmia. Personally viewed-prior 04/18/14 at 10:18 AM, sinus bradycardia rate 57, no other abnormalities.    ECHO: 04/18/14 - Left ventricle: The cavity size was normal. Wall thickness was normal. Systolic function was normal. The estimated ejection fraction was in the range of 60% to 65%. Wall motion was normal; there were no regional wall motion abnormalities. - Left atrium: The atrium was mildly dilated. - Atrial septum: No defect or patent foramen ovale was identified. - Pulmonary arteries: PA peak pressure: 32 mm Hg (S).  Labs: 12/2016-TSH normal, creatinine 0.79. Prior medical records reviewed, ER visit reviewed.   ASSESSMENT AND PLAN:  Hyperlipidemia with statin intolerance  - Appreciate lipid clinic. Notes reviewed. She is now at goal LDL less than 70 on Crestor for times weekly. Excellent job.  - Back surgery seemed to have helped correct her bilateral leg issues that were causing pain that she thought was from the Crestor.  Paroxysmal atrial fibrillation  - CHADS-VASc at least 3 (Female, AGE, HTN)  - Doing well.  - Previously on atenolol now on diltiazem.  - Diltiazem 60 mg when necessary A. fib.  - 193 on Friday - 127 after 10 min. 8.5 hours before conversion.  Felt dizzy at times.   - Since the symptoms are becoming more frequent and she is highly symptomatic with them, I  will have her see EP to discuss possible ablative therapy or potentially antiarrhythmic.  - order echo  Essential hypertension  - BP well controlled. Medication reviewed.  Chronic anticoagulation  - Eliquis. Dr. Justin Mend has been following lab work.  Moderate carotid artery plaque  - Statin. Asymptomatic.    Signed, Candee Furbish, MD [facility]  03/11/2017 2:36 PM

## 2017-03-11 NOTE — Patient Instructions (Signed)
Medication Instructions:  The current medical regimen is effective;  continue present plan and medications.  Testing/Procedures: Your physician has requested that you have an echocardiogram. Echocardiography is a painless test that uses sound waves to create images of your heart. It provides your doctor with information about the size and shape of your heart and how well your heart's chambers and valves are working. This procedure takes approximately one hour. There are no restrictions for this procedure.  Follow-Up: Follow up in 6 months with Dr. Marlou Porch.  You will receive a letter in the mail 2 months before you are due.  Please call us when you receive this letter to schedule your follow up appointment.  You have been referred to EP to discuss At Fib ablation.  Thank you for choosing [facility]!!

## 2017-03-17 ENCOUNTER — Encounter: Payer: Self-pay | Admitting: *Deleted

## 2017-03-17 ENCOUNTER — Other Ambulatory Visit: Payer: Self-pay | Admitting: Cardiology

## 2017-03-17 ENCOUNTER — Encounter: Payer: Self-pay | Admitting: Cardiology

## 2017-03-17 ENCOUNTER — Ambulatory Visit (INDEPENDENT_AMBULATORY_CARE_PROVIDER_SITE_OTHER): Payer: PPO | Admitting: Cardiology

## 2017-03-17 VITALS — BP 142/70 | HR 76 | Ht 62.0 in | Wt 136.0 lb

## 2017-03-17 DIAGNOSIS — Z01812 Encounter for preprocedural laboratory examination: Secondary | ICD-10-CM | POA: Diagnosis not present

## 2017-03-17 DIAGNOSIS — I480 Paroxysmal atrial fibrillation: Secondary | ICD-10-CM

## 2017-03-17 NOTE — Patient Instructions (Addendum)
Medication Instructions:    Your physician recommends that you continue on your current medications as directed. Please refer to the Current Medication list given to you today.  Labwork:  Your physician recommends that you return for pre procedure  lab work between: 04/11/2017 - 04/23/2017 for BMET & CBC w/ diff  Testing/Procedures: Your physician has recommended that you have an ablation. Catheter ablation is a medical procedure used to treat some cardiac arrhythmias (irregular heartbeats). During catheter ablation, a long, thin, flexible tube is put into a blood vessel in your groin (upper thigh), or neck. This tube is called an ablation catheter. It is then guided to your heart through the blood vessel. Radio frequency waves destroy small areas of heart tissue where abnormal heartbeats may cause an arrhythmia to start. Please see the instruction sheet given to you today.  Your physician has requested that you have cardiac CT within 7 days prior to your ablation on 04/25/2017.. Cardiac computed tomography (CT) is a painless test that uses an x-ray machine to take clear, detailed pictures of your heart. For further information please visit [email or web address].   Someone from the office will call you to arrange this testing.  Follow-Up:  .Your physician recommends that you schedule a follow-up appointment in: 4 weeks, after your procedure on 04/25/2017, with Roderic Palau in the AFib clinic.   Your physician recommends that you schedule a follow-up appointment in: 3 months, after your procedure on 04/25/2017, with Dr. Curt Bears  - If you need a refill on your cardiac medications before your next appointment, please call your pharmacy.   Thank you for choosing [facility]!!   Trinidad Curet, RN [phone]  Any Other Special Instructions Will Be Listed Below (If Applicable).  Cardiac Ablation Cardiac ablation is a procedure to disable (ablate) a small amount of heart tissue in very  specific places. The heart has many electrical connections. Sometimes these connections are abnormal and can cause the heart to beat very fast or irregularly. Ablating some of the problem areas can improve the heart rhythm or return it to normal. Ablation may be done for people who:  Have Wolff-Parkinson-White syndrome.  Have fast heart rhythms (tachycardia).  Have taken medicines for an abnormal heart rhythm (arrhythmia) that were not effective or caused side effects.  Have a high-risk heartbeat that may be life-threatening. During the procedure, a small incision is made in the neck or the groin, and a long, thin, flexible tube (catheter) is inserted into the incision and moved to the heart. Small devices (electrodes) on the tip of the catheter will send out electrical currents. A type of X-ray (fluoroscopy) will be used to help guide the catheter and to provide images of the heart. Tell a health care provider about:  Any allergies you have.  All medicines you are taking, including vitamins, herbs, eye drops, creams, and over-the-counter medicines.  Any problems you or family members have had with anesthetic medicines.  Any blood disorders you have.  Any surgeries you have had.  Any medical conditions you have, such as kidney failure.  Whether you are pregnant or may be pregnant. What are the risks? Generally, this is a safe procedure. However, problems may occur, including:  Infection.  Bruising and bleeding at the catheter insertion site.  Bleeding into the chest, especially into the sac that surrounds the heart. This is a serious complication.  Stroke or blood clots.  Damage to other structures or organs.  Allergic reaction to medicines or dyes.  Need  for a permanent pacemaker if the normal electrical system is damaged. A pacemaker is a small computer that sends electrical signals to the heart and helps your heart beat normally.  The procedure not being fully effective.  This may not be recognized until months later. Repeat ablation procedures are sometimes required. What happens before the procedure?  Follow instructions from your health care provider about eating or drinking restrictions.  Ask your health care provider about:  Changing or stopping your regular medicines. This is especially important if you are taking diabetes medicines or blood thinners.  Taking medicines such as aspirin and ibuprofen. These medicines can thin your blood. Do not take these medicines before your procedure if your health care provider instructs you not to.  Plan to have someone take you home from the hospital or clinic.  If you will be going home right after the procedure, plan to have someone with you for 24 hours. What happens during the procedure?  To lower your risk of infection:  Your health care team will wash or sanitize their hands.  Your skin will be washed with soap.  Hair may be removed from the incision area.  An IV tube will be inserted into one of your veins.  You will be given a medicine to help you relax (sedative).  The skin on your neck or groin will be numbed.  An incision will be made in your neck or your groin.  A needle will be inserted through the incision and into a large vein in your neck or groin.  A catheter will be inserted into the needle and moved to your heart.  Dye may be injected through the catheter to help your surgeon see the area of the heart that needs treatment.  Electrical currents will be sent from the catheter to ablate heart tissue in desired areas. There are three types of energy that may be used to ablate heart tissue:  Heat (radiofrequency energy).  Laser energy.  Extreme cold (cryoablation).  When the necessary tissue has been ablated, the catheter will be removed.  Pressure will be held on the catheter insertion area to prevent excessive bleeding.  A bandage (dressing) will be placed over the catheter  insertion area. The procedure may vary among health care providers and hospitals. What happens after the procedure?  Your blood pressure, heart rate, breathing rate, and blood oxygen level will be monitored until the medicines you were given have worn off.  Your catheter insertion area will be monitored for bleeding. You will need to lie still for a few hours to ensure that you do not bleed from the catheter insertion area.  Do not drive for 24 hours or as long as directed by your health care provider. Summary  Cardiac ablation is a procedure to disable (ablate) a small amount of heart tissue in very specific places. Ablating some of the problem areas can improve the heart rhythm or return it to normal.  During the procedure, electrical currents will be sent from the catheter to ablate heart tissue in desired areas. This information is not intended to replace advice given to you by your health care provider. Make sure you discuss any questions you have with your health care provider. Document Released: 03/23/2009 Document Revised: 09/23/2016 Document Reviewed: 09/23/2016 Elsevier Interactive Patient Education  2017 [employer].

## 2017-03-17 NOTE — Progress Notes (Signed)
Electrophysiology Office Note   Date:  03/17/2017   ID:  KIRSI HUGH, DOB [birth year], MRN [id number]  PCP:  Kaitlyn Bellows, MD  Cardiologist:  Kaitlyn Hoover Primary Electrophysiologist:  Kaitlyn Haw, MD    Chief Complaint  Patient presents with  . Advice Only    PAF     History of Present Illness: Kaitlyn Hoover is a 76 y.o. female who is being seen today for the evaluation of SVT, atrial fibrillation at the request of Kaitlyn Small, MD. Presenting today for electrophysiology evaluation. She hadn't had multiple episodes of ER visits due to atrial fibrillation. She was initially put on diltiazem 180 mg which has improved her symptoms. Her episodes of SVT have been converted with Valsalva. More recently, she has had more episodes of atrial fibrillation. This month, she had an episode where her heart rate got to 193. She felt dizzy. Over the next 10-15 minutes her heart rate decreased to 120 and broke to sinus rhythm 8 hours later.  Her symptoms with atrial fibrillation and SVT are of palpitations, fatigue, and shortness of breath. She has been having palpitations daily over the last few weeks.    Today, she denies symptoms of palpitations, chest pain, shortness of breath, orthopnea, PND, lower extremity edema, claudication, dizziness, presyncope, syncope, bleeding, or neurologic sequela. The patient is tolerating medications without difficulties.    Past Medical History:  Diagnosis Date  . Anxiety   . Arthritis   . Atrial fibrillation with RVR ([place])   . Bipolar disorder ([place])   . Dysrhythmia   . GERD (gastroesophageal reflux disease)   . Hypertension   . Hypothyroidism   . Palpitations   . PAT (paroxysmal atrial tachycardia) ([place])   . Post-polio syndrome   . Sinus infection   . 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 ABDOMINAL HYSTERECTOMY    . CATARACT EXTRACTION W/ INTRAOCULAR LENS  IMPLANT, BILATERAL    . CERVICAL  LAMINECTOMY    . CESAREAN SECTION     X2   . LEGS      AGE 54-11   SEVERAL SURGERIES FOR POLIO   . THYROIDECTOMY       Current Outpatient Prescriptions  Medication Sig Dispense Refill  . acyclovir (ZOVIRAX) 400 MG tablet Take 400 mg by mouth daily.     Marland Kitchen alendronate (FOSAMAX) 70 MG tablet Take 70 mg by mouth once a week. Take with a full glass of water on an empty stomach.    . Alendronate-Cholecalciferol (FOSAMAX PLUS D PO) Take by mouth.    Marland Kitchen apixaban (ELIQUIS) 5 MG TABS tablet Take 1 tablet (5 mg total) by mouth 2 (two) times daily. 180 tablet 1  . Ascorbic Acid (VITAMIN C) 1000 MG tablet Take 1,000 mg by mouth daily.    Marland Kitchen atenolol (TENORMIN) 25 MG tablet Take 1 tablet by mouth as needed. Irregular heartbeat    . calcium carbonate (OSCAL) 1500 (600 Ca) MG TABS tablet Take 2 tablets by mouth daily.    . Coenzyme Q10 (CO Q-10) 100 MG CAPS Take 100 mg by mouth daily.     Marland Kitchen diltiazem (CARDIZEM CD) 180 MG 24 hr capsule Take 1 capsule (180 mg total) by mouth daily. 90 capsule 3  . FANAPT 4 MG TABS tablet Take 1 mg by mouth daily.     . Ginkgo Biloba Extract 60 MG CAPS Take 240 mg by mouth daily.    Marland Kitchen  levothyroxine (SYNTHROID, LEVOTHROID) 100 MCG tablet Take 100 mcg by mouth daily.    . Melatonin 3 MG TABS Take 3 mg by mouth at bedtime.    . Multiple Vitamin (MULTIVITAMIN) capsule Take 1 capsule by mouth 2 (two) times daily.    . Omega-3 Fatty Acids (FISH OIL BURP-LESS) 1000 MG CAPS Take 3,000 Units by mouth 2 (two) times daily.    Marland Kitchen omeprazole (PRILOSEC) 20 MG capsule Take 20 mg by mouth every morning.    Marland Kitchen oxybutynin (DITROPAN-XL) 10 MG 24 hr tablet Take 10 mg by mouth 2 (two) times daily.    . polyethylene glycol (MIRALAX / GLYCOLAX) packet Take 17 g by mouth at bedtime.    . rosuvastatin (CRESTOR) 10 MG tablet Take 10 mg by mouth as directed. Take one tablet up to 4 times weekly    . temazepam (RESTORIL) 15 MG capsule Take 15 mg by mouth at bedtime as needed for sleep.    . ziprasidone  (GEODON) 20 MG capsule Take 20 mg by mouth daily.     No current facility-administered medications for this visit.     Allergies:   Sulfa antibiotics and Ciprocinonide [fluocinolone]   Social History:  The patient  reports that she has quit smoking. She has never used smokeless tobacco. She reports that she drinks alcohol. She reports that she does not use drugs.   Family History:  The patient's family history includes Atrial fibrillation in her brother; COPD in her brother; Cancer in her father; Hypertension in her mother.    ROS:  Please see the history of present illness.   Otherwise, review of systems is positive for palpitations, anxiety.   All other systems are reviewed and negative.    PHYSICAL EXAM: VS:  BP (!) 142/70   Pulse 76   Ht 5\' 2"  (1.575 m)   Wt 136 lb (61.7 kg)   LMP  (LMP Unknown)   SpO2 97%   BMI 24.87 kg/m  , BMI Body mass index is 24.87 kg/m. GEN: Well nourished, well developed, in no acute distress  HEENT: normal  Neck: no JVD, carotid bruits, or masses Cardiac: RRR; no murmurs, rubs, or gallops,no edema  Respiratory:  clear to auscultation bilaterally, normal work of breathing GI: soft, nontender, nondistended, + BS MS: no deformity or atrophy  Skin: warm and dry Neuro:  Strength and sensation are intact Psych: euthymic mood, full affect  EKG:  EKG is ordered today. Personal review of the ekg ordered shows sinus rhythm, rate 60, T wave flattening  Recent Labs: 07/24/2016: BUN 21; Creatinine, Ser 0.94; Hemoglobin 12.7; Platelets 227; Potassium 3.9; Sodium 138 02/12/2017: ALT 12    Lipid Panel     Component Value Date/Time   CHOL 136 02/12/2017 1013   TRIG 114 02/12/2017 1013   HDL 46 02/12/2017 1013   CHOLHDL 3.0 02/12/2017 1013   CHOLHDL 3.5 10/22/2016 1039   VLDL 19 10/22/2016 1039   LDLCALC 67 02/12/2017 1013     Wt Readings from Last 3 Encounters:  03/17/17 136 lb (61.7 kg)  03/11/17 137 lb (62.1 kg)  12/16/16 135 lb (61.2 kg)       Other studies Reviewed: Additional studies/ records that were reviewed today include: TTE 2015  Review of the above records today demonstrates:  - Left ventricle: The cavity size was normal. Wall thickness was normal. Systolic function was normal. The estimated ejection fraction was in the range of 60% to 65%. Wall motion was normal; there were no  regional wall motion abnormalities. - Left atrium: The atrium was mildly dilated. - Atrial septum: No defect or patent foramen ovale was identified. - Pulmonary arteries: PA peak pressure: 32 mm Hg (S).   ASSESSMENT AND PLAN:  1.  Paroxysmal atrial fibrillation: Currently on atenolol and diltiazem. Eliquis for anticoagulation.  Discussed with her options of ablation versus medical management. Unfortunately, she is on multiple psych medications for her bipolar that have serious interactions with antiarrhythmic medications. We'll plan for ablation. Risks and benefits of ablation were discussed. Risks include bleeding, tamponade, heart block, stroke, and damage to surrounding organs. She understands these risks and has agreed to the procedure.  This patients CHA2DS2-VASc Score and unadjusted Ischemic Stroke Rate (% per year) is equal to 3.2 % stroke rate/year from a score of 3  Above score calculated as 1 point each if present [CHF, HTN, DM, Vascular=MI/PAD/Aortic Plaque, Age if 65-74, or Female] Above score calculated as 2 points each if present [Age > 75, or Stroke/TIA/TE]  2. Hypertension: Well-controlled, continue current medications   3. Carotid artery plaque: Currently on a statin. Asymptomatic.  4. SVT: Has had multiple episodes of SVT. Breaks with Valsalva. I discussed with her the possibility of ablation versus medical management. We'll do a full EP study during the ablation for atrial fibrillation and Merrit Friesen plan for ablation of SVT if anything is induced.  Current medicines are reviewed at length with the patient today.   The  patient does not have concerns regarding her medicines.  The following changes were made today:  flecainide  Labs/ tests ordered today include:  Orders Placed This Encounter  Procedures  . Basic Metabolic Panel (BMET)  . CBC w/Diff     Disposition:   FU with Shunna Mikaelian 3 months  Signed, Burnis Halling Meredith Leeds, MD  03/17/2017 10:43 AM     [facility] [address] [place] [address] [phone] (office) [phone] (fax)

## 2017-03-17 NOTE — Addendum Note (Signed)
Addended by: [place] Kidney on: 03/17/2017 11:07 AM   Modules accepted: Orders

## 2017-03-18 ENCOUNTER — Ambulatory Visit: Payer: PPO | Admitting: Cardiology

## 2017-03-25 ENCOUNTER — Telehealth: Payer: Self-pay | Admitting: Cardiology

## 2017-03-25 NOTE — Telephone Encounter (Signed)
Follow Up:   Please call,she have some questions about her Ablation.

## 2017-03-26 ENCOUNTER — Other Ambulatory Visit: Payer: Self-pay | Admitting: Cardiology

## 2017-03-26 ENCOUNTER — Other Ambulatory Visit (HOSPITAL_COMMUNITY): Payer: PPO

## 2017-03-26 NOTE — Telephone Encounter (Signed)
Request received for Eliquis 5mg ; pt is 76 yrs old, wt-61.7kg, Crea-0.94 on 07/24/16, pt was last seen by Dr. Curt Bears on 03/17/17. Pt will have another BMET done on 04/11/17 per orders in EPIC by Dr. Curt Bears. Will send in requested refill to requested Pharmacy.

## 2017-03-26 NOTE — Telephone Encounter (Signed)
Pt asking if she would be under anesthesia for her ablation - informed that she would for an afib ablation. She thanks me for calling and answering her question.

## 2017-04-01 DIAGNOSIS — M8588 Other specified disorders of bone density and structure, other site: Secondary | ICD-10-CM | POA: Diagnosis not present

## 2017-04-01 DIAGNOSIS — E2839 Other primary ovarian failure: Secondary | ICD-10-CM | POA: Diagnosis not present

## 2017-04-02 ENCOUNTER — Encounter (HOSPITAL_COMMUNITY): Payer: Self-pay | Admitting: Emergency Medicine

## 2017-04-02 ENCOUNTER — Emergency Department (HOSPITAL_COMMUNITY)
Admission: EM | Admit: 2017-04-02 | Discharge: 2017-04-02 | Disposition: A | Discharge status: Home / Self Care | Payer: PPO | Attending: Emergency Medicine | Admitting: Emergency Medicine

## 2017-04-02 DIAGNOSIS — R000 Tachycardia, unspecified: Secondary | ICD-10-CM | POA: Diagnosis not present

## 2017-04-02 DIAGNOSIS — Z87891 Personal history of nicotine dependence: Secondary | ICD-10-CM | POA: Insufficient documentation

## 2017-04-02 DIAGNOSIS — E039 Hypothyroidism, unspecified: Secondary | ICD-10-CM | POA: Diagnosis not present

## 2017-04-02 DIAGNOSIS — I10 Essential (primary) hypertension: Secondary | ICD-10-CM | POA: Diagnosis not present

## 2017-04-02 DIAGNOSIS — Z79899 Other long term (current) drug therapy: Secondary | ICD-10-CM | POA: Diagnosis not present

## 2017-04-02 DIAGNOSIS — Z7901 Long term (current) use of anticoagulants: Secondary | ICD-10-CM | POA: Diagnosis not present

## 2017-04-02 DIAGNOSIS — I4891 Unspecified atrial fibrillation: Secondary | ICD-10-CM | POA: Insufficient documentation

## 2017-04-02 LAB — CBC WITH DIFFERENTIAL/PLATELET
BASOS ABS: 0 10*3/uL (ref 0.0–0.1)
Basophils Relative: 0 %
Eosinophils Absolute: 0.2 10*3/uL (ref 0.0–0.7)
Eosinophils Relative: 2 %
HEMATOCRIT: 39.7 % (ref 36.0–46.0)
Hemoglobin: 12.8 g/dL (ref 12.0–15.0)
Lymphocytes Relative: 15 %
Lymphs Abs: 1.5 10*3/uL (ref 0.7–4.0)
MCH: 29.7 pg (ref 26.0–34.0)
MCHC: 32.2 g/dL (ref 30.0–36.0)
MCV: 92.1 fL (ref 78.0–100.0)
Monocytes Absolute: 0.5 10*3/uL (ref 0.1–1.0)
Monocytes Relative: 5 %
NEUTROS ABS: 7.8 10*3/uL — AB (ref 1.7–7.7)
NEUTROS PCT: 78 %
Platelets: 248 10*3/uL (ref 150–400)
RBC: 4.31 MIL/uL (ref 3.87–5.11)
RDW: 13.1 % (ref 11.5–15.5)
WBC: 10 10*3/uL (ref 4.0–10.5)

## 2017-04-02 LAB — COMPREHENSIVE METABOLIC PANEL
ALT: 15 U/L (ref 14–54)
AST: 31 U/L (ref 15–41)
Albumin: 3.2 g/dL — ABNORMAL LOW (ref 3.5–5.0)
Alkaline Phosphatase: 81 U/L (ref 38–126)
Anion gap: 9 (ref 5–15)
BILIRUBIN TOTAL: 0.5 mg/dL (ref 0.3–1.2)
BUN: 10 mg/dL (ref 6–20)
CHLORIDE: 108 mmol/L (ref 101–111)
CO2: 24 mmol/L (ref 22–32)
CREATININE: 0.85 mg/dL (ref 0.44–1.00)
Calcium: 8.9 mg/dL (ref 8.9–10.3)
Glucose, Bld: 128 mg/dL — ABNORMAL HIGH (ref 65–99)
Potassium: 4 mmol/L (ref 3.5–5.1)
Sodium: 141 mmol/L (ref 135–145)
TOTAL PROTEIN: 7.9 g/dL (ref 6.5–8.1)

## 2017-04-02 LAB — I-STAT TROPONIN, ED: TROPONIN I, POC: 0.01 ng/mL (ref 0.00–0.08)

## 2017-04-02 MED ORDER — SODIUM CHLORIDE 0.9 % IV BOLUS (SEPSIS): 500.0000 mL | Freq: Once | INTRAVENOUS | Status: DC

## 2017-04-02 MED ORDER — DILTIAZEM HCL-DEXTROSE 100-5 MG/100ML-% IV SOLN (PREMIX)
5.0000 mg/h | Freq: Once | INTRAVENOUS | Status: AC
  Administered 2017-04-02: 5 mg/h via INTRAVENOUS
  Filled 2017-04-02 (×2): qty 100

## 2017-04-02 MED ORDER — SODIUM CHLORIDE 0.9 % IV BOLUS (SEPSIS)
1000.0000 mL | Freq: Once | INTRAVENOUS | Status: AC
  Administered 2017-04-02: 1000 mL via INTRAVENOUS

## 2017-04-02 MED ORDER — DILTIAZEM HCL 25 MG/5ML IV SOLN
20.0000 mg | Freq: Once | INTRAVENOUS | Status: AC
  Administered 2017-04-02: 20 mg via INTRAVENOUS
  Filled 2017-04-02: qty 5

## 2017-04-02 MED ORDER — FLECAINIDE ACETATE 100 MG PO TABS
200.0000 mg | ORAL_TABLET | ORAL | Status: AC
  Administered 2017-04-02: 200 mg via ORAL
  Filled 2017-04-02: qty 2

## 2017-04-02 NOTE — ED Triage Notes (Signed)
Pt to ER by [facility] from home with complaint of palpitations and dizziness onset this morning. Hx of paroxymal atrial fibrillation, takes daily diltiazem, has not missed any doses. EMS initially thought patient rhythm to be SVT, was given 6 mg adenosine but underlying rhythm revealed atrial fibrillation. Rate on arrival 140 bpm. Pt denies shortness of breath or chest pain. Pt reports 2 weeks of sinus problems and has been taking decongestants. Pt is alert and oriented x4.

## 2017-04-02 NOTE — ED Notes (Signed)
Lunch tray ordered; heart healthy diet 

## 2017-04-02 NOTE — H&P (Signed)
Patient ID: Kaitlyn Hoover MRN: [id number], DOB/AGE: September 11, 1941   Admit date: 04/02/2017   Primary Physician: [name], MD Primary Cardiologist: Dr. Marlou Porch Electrophysiologist: Dr. Curt Bears   Pt. Profile:  Kaitlyn Hoover is a 76 y.o. female with a h/o PAF and SVT which have been difficult to control, with plans for afib ablation next month, who is being seen today for the evaluation of recurrent symptomatic atrial fibrillation w/ RVR, at the request of Dr. Darl Householder, Emergency Medicine physician.   Problem List  Past Medical History:  Diagnosis Date  . Anxiety   . Arthritis   . Atrial fibrillation with RVR ([place])   . Bipolar disorder ([place])   . Dysrhythmia   . GERD (gastroesophageal reflux disease)   . Hypertension   . Hypothyroidism   . Palpitations   . PAT (paroxysmal atrial tachycardia) ([place])   . Post-polio syndrome   . Sinus infection   . 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 ABDOMINAL HYSTERECTOMY    . CATARACT EXTRACTION W/ INTRAOCULAR LENS  IMPLANT, BILATERAL    . CERVICAL LAMINECTOMY    . CESAREAN SECTION     X2   . LEGS      AGE 41-11   SEVERAL SURGERIES FOR POLIO   . THYROIDECTOMY       Allergies  Allergies  Allergen Reactions  . Sulfa Antibiotics Rash    Childhood allergy  . Ciprocinonide [Fluocinolone] Rash    HPI  Kaitlyn Hoover is a 76 y.o. female with a h/o PAF and SVT which have been difficult to control, with plans for afib ablation next month, who is being seen today for the evaluation of recurrent symptomatic atrial fibrillation w/ RVR, at the request of Dr. Darl Householder, Emergency Medicine physician.   She is followed by Dr. Marlou Porch and Dr. Curt Bears. She was just seen by Dr. Curt Bears in EP clinic on 03/17/17. Per note, she has not done well with rate control strategy and continues to have frequent recurrence of symptomatic atrial fibrillation w/ RVR despite treatment with Cardizem and atenolol.  Subsequently, plans have been made for her to undergo afib ablation with Dr. Curt Bears on 04/23/17. She is on anticoagulation therapy with Eliquis.   She was doing ok until recently when she started to be bother with allergies/ sinuses and has been using OTC decongestants. This morning, she developed sudden onset palpitations. She called EMS. Initially felt to be in SVT and given adenosine. No conversion however once rate slowed underlying rhythm was noted to be atrial fibrillation. On arrival to ED, pt was still in afib with RVR in the 140s. Given she has been anticoagulated with Eliquis, initial plan was to attempt DCCV in the ED, however the patient ate breakfast around 8am, thus plan was aborted. Cardiology has been asked to assist with rate control management.   Labs are unremarkable. K 4.0. Hgb 12.8. SCr 0.85. BUN 10. POC troponin is negative. She is afebrile. She denies CP, dyspnea, n/v/d, dysuria. She denies caffeine but admits to drinking a glass of wine every night.   Note: last 2D echo was in 2015>> normal LVEF at 60-65% with mild LAE and mild MR at that time. No known coronary disease. Other PMH + for HTN, HLD and hypothyroidism on synthroid. No recent TSH on file.    Home Medications  Prior to Admission medications   Medication Sig Start Date End Date  Taking? Authorizing Provider  acyclovir (ZOVIRAX) 400 MG tablet Take 400 mg by mouth daily.  02/05/14   [provider]  alendronate (FOSAMAX) 70 MG tablet Take 70 mg by mouth once a week. Take with a full glass of water on an empty stomach.    [provider]  Alendronate-Cholecalciferol (FOSAMAX PLUS D PO) Take by mouth.    [provider]  Ascorbic Acid (VITAMIN C) 1000 MG tablet Take 1,000 mg by mouth daily.    [provider]  atenolol (TENORMIN) 25 MG tablet Take 1 tablet by mouth as needed. Irregular heartbeat    [provider]  calcium carbonate (OSCAL) 1500 (600 Ca) MG TABS tablet Take 2  tablets by mouth daily.    [provider]  Coenzyme Q10 (CO Q-10) 100 MG CAPS Take 100 mg by mouth daily.     [provider]  diltiazem (CARDIZEM CD) 180 MG 24 hr capsule Take 1 capsule (180 mg total) by mouth daily. 08/20/16   Jerline Pain, MD  ELIQUIS 5 MG TABS tablet TAKE 1 TABLET BY MOUTH TWICE DAILY 03/26/17   Evans Lance, MD  FANAPT 4 MG TABS tablet Take 1 mg by mouth daily.  11/19/16   [provider]  Ginkgo Biloba Extract 60 MG CAPS Take 240 mg by mouth daily.    [provider]  levothyroxine (SYNTHROID, LEVOTHROID) 100 MCG tablet Take 100 mcg by mouth daily. 02/16/14   [provider]  Melatonin 3 MG TABS Take 3 mg by mouth at bedtime.    [provider]  Multiple Vitamin (MULTIVITAMIN) capsule Take 1 capsule by mouth 2 (two) times daily.    [provider]  Omega-3 Fatty Acids (FISH OIL BURP-LESS) 1000 MG CAPS Take 3,000 Units by mouth 2 (two) times daily.    [provider]  omeprazole (PRILOSEC) 20 MG capsule Take 20 mg by mouth every morning.    [provider]  oxybutynin (DITROPAN-XL) 10 MG 24 hr tablet Take 10 mg by mouth 2 (two) times daily.    [provider]  polyethylene glycol (MIRALAX / GLYCOLAX) packet Take 17 g by mouth at bedtime.    [provider]  rosuvastatin (CRESTOR) 10 MG tablet Take 10 mg by mouth as directed. Take one tablet up to 4 times weekly    [provider]  temazepam (RESTORIL) 15 MG capsule Take 15 mg by mouth at bedtime as needed for sleep. 06/01/16   [provider]  ziprasidone (GEODON) 20 MG capsule Take 20 mg by mouth daily.    [provider]    Hospital Medications   . diltiazem (CARDIZEM) infusion       Family History  Family History  Problem Relation Age of Onset  . Hypertension Mother   . Cancer Father        JAW BONE  . COPD Brother   . Atrial fibrillation Brother     Social History  Social History    Social History  . Marital status: Divorced    Spouse name: N/A  . Number of children: N/A  . Years of education: N/A   Occupational History  . Not on file.   Social History Main Topics  . Smoking status: Former Research scientist (life sciences)  . Smokeless tobacco: Never Used  . Alcohol use Yes     Comment: GLASS WINE W/ DINNER  . Drug use: No  . Sexual activity: Not on file   Other Topics Concern  .  Not on file   Social History Narrative  . No narrative on file     Review of Systems General:  No chills, fever, night sweats or weight changes.  Cardiovascular:  No chest pain, dyspnea on exertion, edema, orthopnea, palpitations, paroxysmal nocturnal dyspnea. Dermatological: No rash, lesions/masses Respiratory: No cough, dyspnea Urologic: No hematuria, dysuria Abdominal:   No nausea, vomiting, diarrhea, bright red blood per rectum, melena, or hematemesis Neurologic:  No visual changes, wkns, changes in mental status. All other systems reviewed and are otherwise negative except as noted above.  Physical Exam  Blood pressure 131/78, pulse (!) 140, temperature 98 F (36.7 C), temperature source Oral, resp. rate 18, height 5\' 2"  (1.575 m), weight 135 lb (61.2 kg), SpO2 96 %.  General: Pleasant, NAD Psych: Normal affect. Neuro: Alert and oriented X 3. Moves all extremities spontaneously. HEENT: Normal  Neck: Supple without bruits or JVD. Lungs:  Resp regular and unlabored, CTA. Heart: irregularly irregular, tachy rate no s3, s4, or murmurs. Abdomen: Soft, non-tender, non-distended, BS + x 4.  Extremities: No clubbing, cyanosis or edema. DP/PT/Radials 2+ and equal bilaterally.  Labs  Troponin (Point of Care Test)  Recent Labs  04/02/17 1009  TROPIPOC 0.01   No results for input(s): CKTOTAL, CKMB, TROPONINI in the last 72 hours. Lab Results  Component Value Date   WBC 10.0 04/02/2017   HGB 12.8 04/02/2017   HCT 39.7 04/02/2017   MCV 92.1 04/02/2017   PLT 248 04/02/2017    Recent  Labs Lab 04/02/17 0946  NA 141  K 4.0  CL 108  CO2 24  BUN 10  CREATININE 0.85  CALCIUM 8.9  PROT 7.9  BILITOT 0.5  ALKPHOS 81  ALT 15  AST 31  GLUCOSE 128*   Lab Results  Component Value Date   CHOL 136 02/12/2017   HDL 46 02/12/2017   LDLCALC 67 02/12/2017   TRIG 114 02/12/2017   Lab Results  Component Value Date   DDIMER 0.41 04/17/2014     Radiology/Studies  No results found.  ECG  No ED EKG on file, last EKG is from 03/11/17-- personally reviewed  Telemetry  Atrial fibrillation w/ RVR -- personally reviewed    ASSESSMENT AND PLAN  1. Atrial Fibrillation w/ RVR: recurrent problem. She has failed rate control strategy with CCB +BB. Plan is for afib ablation with Dr. Curt Bears on 04/23/17. 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 Electrolytes, H/H, WBC and renal function all WNL. She has hypothyroidism and is on synthroid, however no recent thyroid studies on file. Will check a TSH to ensure that she is not being over treated with hormone replacement. We are unable to perform DCCV in ED given the patient ate breakfast 3 hrs ago. Start IV Cardizem for rate control. Continue Eliquis for a/c. Pt missed am dose of Cardizem and Eliquis. Give home doses now. If we are able to achieve rate control and if symptoms resolve, we can consider discharging w/o admission and continue with plans for outpatient ablation. Although patient may wish for this to be done sooner than later. However, if we are unable to adequately control rate in ED, then we may need to admit to continue IV Cardizem and monitor overnight on telemetry. Avoid further use of decongestants, as this can increase HR and exacerbated arrhthymias.  MD to follow with further recommendations.    Signed, Lyda Jester, PA-C, MHS 04/02/2017, 10:47 AM [facility] Pager: [phone]

## 2017-04-02 NOTE — Discharge Instructions (Signed)
Take your eliquis as prescribed.   Continue taking your cardizem.   See your cardiologist   Return to ER if you have worse palpitations, chest pain, shortness of breath.

## 2017-04-02 NOTE — ED Provider Notes (Addendum)
[facility] DEPT Provider Note   CSN: [id number] Arrival date & time: 04/02/17  9030     History   Chief Complaint Chief Complaint  Patient presents with  . Atrial Fibrillation    HPI Kaitlyn Hoover is a 76 y.o. female hx of afib on eliquis, GERD, HTN, Here presenting with palpitations. Patient has been congested for the last 2 weeks and has been taking oral decongestant. She was fine until this morning and she had sudden onset of palpitation. She states that she was concerned that she went into her atrial fibrillation. Denies any shortness of breath or chest pain. Patient did not take her to the house and this morning but did eat breakfast at 8am. No leg swelling or chest pain. Has hx of afib on eliquis.   The history is provided by the patient.    Past Medical History:  Diagnosis Date  . Anxiety   . Arthritis   . Atrial fibrillation with RVR ([place])   . Bipolar disorder ([place])   . Dysrhythmia   . GERD (gastroesophageal reflux disease)   . Hypertension   . Hypothyroidism   . Palpitations   . PAT (paroxysmal atrial tachycardia) ([place])   . Post-polio syndrome   . Sinus infection   . 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 Hyperlipidemia 03/11/2017  . Surgery, elective 08/02/2016  . Spondylolisthesis, lumbar region 07/31/2016  . Hyperlipemia 04/12/2016  . Gait disturbance 01/26/2016  . Spinal stenosis of lumbar region 01/26/2016  . Progressive focal motor weakness 01/12/2016  . Muscle atrophy 01/12/2016  . Urinary urgency 01/12/2016  . Encounter for long-term (current) use of high-risk medication 03/03/2015  . Essential hypertension 03/03/2015  . Atrial fibrillation with RVR ([place]) 04/18/2014  . Palpitations 04/18/2014  . 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 Hypertension   . 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 ABDOMINAL HYSTERECTOMY    . CATARACT  EXTRACTION W/ INTRAOCULAR LENS  IMPLANT, BILATERAL    . CERVICAL LAMINECTOMY    . CESAREAN SECTION     X2   . LEGS      AGE 46-11   SEVERAL SURGERIES FOR POLIO   . THYROIDECTOMY      OB History    No data available       Home Medications    Prior to Admission medications   Medication Sig Start Date End Date Taking? Authorizing Provider  acyclovir (ZOVIRAX) 400 MG tablet Take 400 mg by mouth daily.  02/05/14  Yes [provider]  alendronate (FOSAMAX) 70 MG tablet Take 70 mg by mouth every Thursday. Take with a full glass of water on an empty stomach.    Yes [provider]  Ascorbic Acid (VITAMIN C) 1000 MG tablet Take 1,000 mg by mouth daily.   Yes [provider]  calcium carbonate (OSCAL) 1500 (600 Ca) MG TABS tablet Take 3,000 tablets by mouth daily.    Yes [provider]  Coenzyme Q10 (CO Q-10) 100 MG CAPS Take 100 mg by mouth daily.    Yes [provider]  diltiazem (CARDIZEM CD) 180 MG 24 hr capsule Take 1 capsule (180 mg total) by mouth daily. 08/20/16  Yes Jerline Pain, MD  ELIQUIS 5 MG TABS tablet TAKE 1 TABLET BY MOUTH TWICE DAILY 03/26/17  Yes Evans Lance, MD  FANAPT 4 MG TABS tablet  Take 1 mg by mouth at bedtime.  11/19/16  Yes [provider]  Ginkgo Biloba Extract 60 MG CAPS Take 240 mg by mouth daily.   Yes [provider]  levothyroxine (SYNTHROID, LEVOTHROID) 100 MCG tablet Take 100 mcg by mouth daily. 02/16/14  Yes [provider]  Melatonin 3 MG TABS Take 3 mg by mouth at bedtime.   Yes [provider]  Multiple Vitamin (MULTIVITAMIN) capsule Take 1 capsule by mouth 2 (two) times daily.   Yes [provider]  Omega-3 Fatty Acids (FISH OIL BURP-LESS) 1000 MG CAPS Take 3,000 Units by mouth 2 (two) times daily.   Yes [provider]  omeprazole (PRILOSEC) 20 MG capsule Take 20 mg by mouth every morning.   Yes [provider]  oxybutynin (DITROPAN-XL) 10 MG 24 hr  tablet Take 10 mg by mouth 2 (two) times daily.   Yes [provider]  polyethylene glycol (MIRALAX / GLYCOLAX) packet Take 17 g by mouth daily.    Yes [provider]  rosuvastatin (CRESTOR) 10 MG tablet Take 10 mg by mouth as directed. Take one tablet up to 4 times weekly   Yes [provider]  temazepam (RESTORIL) 15 MG capsule Take 15 mg by mouth at bedtime.  06/01/16  Yes [provider]  ziprasidone (GEODON) 20 MG capsule Take 20 mg by mouth every evening.    Yes [provider]  atenolol (TENORMIN) 25 MG tablet Take 25 mg by mouth as needed. Irregular heartbeat    [provider]    Family History Family History  Problem Relation Age of Onset  . Hypertension Mother   . Cancer Father        JAW BONE  . COPD Brother   . Atrial fibrillation Brother     Social History Social History  Substance Use Topics  . Smoking status: Former Research scientist (life sciences)  . Smokeless tobacco: Never Used  . Alcohol use Yes     Comment: GLASS WINE W/ DINNER     Allergies   Sulfa antibiotics; Ciprocinonide [fluocinolone]; and Ciprofloxacin   Review of Systems Review of Systems  Cardiovascular: Positive for palpitations.  All other systems reviewed and are negative.    Physical Exam Updated Vital Signs BP 117/83   Pulse (!) 58   Temp 98 F (36.7 C) (Oral)   Resp 20   Ht 5\' 2"  (1.575 m)   Wt 135 lb (61.2 kg)   LMP  (LMP Unknown)   SpO2 95%   BMI 24.69 kg/m   Physical Exam  Constitutional: She is oriented to person, place, and time.  Chronically ill, slightly uncomfortable   HENT:  Head: Normocephalic.  Mouth/Throat: Oropharynx is clear and moist.  Eyes: EOM are normal. Pupils are equal, round, and reactive to light.  Neck: Normal range of motion. Neck supple.  Cardiovascular:  Tachy, irregular   Pulmonary/Chest: Effort normal and breath sounds normal. No respiratory distress. She has no wheezes.  Abdominal: Soft. Bowel sounds are normal.  She exhibits no distension. There is no tenderness.  Musculoskeletal: Normal range of motion. She exhibits no edema.  Neurological: She is alert and oriented to person, place, and time. No cranial nerve deficit. Coordination normal.  Skin: Skin is warm.  Psychiatric: She has a normal mood and affect.  Nursing note and vitals reviewed.    ED Treatments / Results  Labs (all labs ordered are listed, but only abnormal results are displayed) Labs Reviewed  CBC WITH DIFFERENTIAL/PLATELET -  Abnormal; Notable for the following:       Result Value   Neutro Abs 7.8 (*)    All other components within normal limits  COMPREHENSIVE METABOLIC PANEL - Abnormal; Notable for the following:    Glucose, Bld 128 (*)    Albumin 3.2 (*)    All other components within normal limits  I-STAT TROPOININ, ED    EKG  EKG Interpretation None      ED ECG REPORT I, Wandra Arthurs, the attending physician, personally viewed and interpreted this ECG.   Date: 04/02/2017  EKG Time: 09:38 am  Rate: 120  Rhythm: atrial fibrillation, rate 120  Axis: normal  Intervals:none  ST&T Change: nonspecific    ED ECG REPORT I, Wandra Arthurs, the attending physician, personally viewed and interpreted this ECG.   Date: 04/02/2017  EKG Time: 14:57 pm  Rate: 77  Rhythm: normal EKG, normal sinus rhythm  Axis: normal  Intervals:none  ST&T Change: nonspecific      Radiology No results found.  Procedures Procedures (including critical care time)  CRITICAL CARE Performed by: Wandra Arthurs   Total critical care time: 30 minutes  Critical care time was exclusive of separately billable procedures and treating other patients.  Critical care was necessary to treat or prevent imminent or life-threatening deterioration.  Critical care was time spent personally by me on the following activities: development of treatment plan with patient and/or surrogate as well as nursing, discussions with consultants, evaluation of  patient's response to treatment, examination of patient, obtaining history from patient or surrogate, ordering and performing treatments and interventions, ordering and review of laboratory studies, ordering and review of radiographic studies, pulse oximetry and re-evaluation of patient's condition.     Medications Ordered in ED Medications  diltiazem (CARDIZEM) injection 20 mg (20 mg Intravenous Given 04/02/17 0955)  sodium chloride 0.9 % bolus 1,000 mL (0 mLs Intravenous Stopped 04/02/17 1049)  diltiazem (CARDIZEM) 100 mg in dextrose 5% 141mL (1 mg/mL) infusion (0 mg/hr Intravenous Stopped 04/02/17 1504)  flecainide (TAMBOCOR) tablet 200 mg (200 mg Oral Given 04/02/17 1402)     Initial Impression / Assessment and Plan / ED Course  I have reviewed the triage vital signs and the nursing notes.  Pertinent labs & imaging results that were available during my care of the patient were reviewed by me and considered in my medical decision making (see chart for details).     ARNELLE NALE is a 76 y.o. female here with palpitations. Patient in rapid afib and ate just prior to arrival. She is on eliquis and had acute onset of palpitations but given that she just ate, I don't think she is a candidate for ED cardioversion. Will try cardizem and reassess.   3:50 PM HR still 120s after cardizem bolus. Will start cardizem drip. Consulted cardiology to see patient and admit for afib with RVR.   1 pm Patient max out on cardizem drip. Dr. Oval Linsey from cardiology saw patient. She recommend flecainde in the ED to convert. Patient was offered cardioversion but adamantly refused.   2:50 pm She converted to sinus rhythm now. I updated cardiology. They will observe for another hour and will reassess her.   3:50 PM Remained in sinus for the last hour. I called Dr. Oval Linsey, who states that patient can be discharged. No changes in meds, outpatient follow up with cardiology.   Final Clinical Impressions(s) /  ED Diagnoses   Final diagnoses:  Atrial fibrillation with RVR ([place])  New Prescriptions New Prescriptions   No medications on file     Drenda Freeze, MD 04/02/17 1109    Drenda Freeze, MD 04/02/17 [phone]

## 2017-04-03 ENCOUNTER — Telehealth: Payer: Self-pay | Admitting: Cardiology

## 2017-04-03 NOTE — Telephone Encounter (Signed)
New message    Pt is calling asking for a call back from RN. She did not say what it was in regards to.

## 2017-04-03 NOTE — Telephone Encounter (Signed)
Pt wanted [place] aware that she went to the ED yesterday w/ AFib.  Asking if sooner ablation appt available -- informed that we do not have any sooner procedure date.  Patient verbalized understanding and will continue to monitor symptoms.

## 2017-04-04 ENCOUNTER — Encounter (HOSPITAL_COMMUNITY): Payer: Self-pay | Admitting: Nurse Practitioner

## 2017-04-04 ENCOUNTER — Ambulatory Visit (HOSPITAL_COMMUNITY)
Admission: RE | Admit: 2017-04-04 | Discharge: 2017-04-04 | Disposition: A | Discharge status: Home / Self Care | Payer: PPO | Source: Ambulatory Visit | Attending: Nurse Practitioner | Admitting: Nurse Practitioner

## 2017-04-04 VITALS — BP 132/68 | HR 68 | Ht 62.0 in | Wt 134.4 lb

## 2017-04-04 DIAGNOSIS — I480 Paroxysmal atrial fibrillation: Secondary | ICD-10-CM | POA: Insufficient documentation

## 2017-04-04 DIAGNOSIS — I481 Persistent atrial fibrillation: Secondary | ICD-10-CM

## 2017-04-04 DIAGNOSIS — I4819 Other persistent atrial fibrillation: Secondary | ICD-10-CM

## 2017-04-04 MED ORDER — ATENOLOL 25 MG PO TABS: 12.5000 mg | ORAL_TABLET | Freq: Two times a day (BID) | ORAL | 3 refills | Status: DC

## 2017-04-04 NOTE — Patient Instructions (Signed)
Your physician has recommended you make the following change in your medication:  1)Start Atenolol 12.5mg  twice a day (1/2 tablet of the 25mg  tablet twice a day)

## 2017-04-04 NOTE — Progress Notes (Signed)
Primary Care Physician: [name], MD Referring Physician: ER f/u EP: Dr. Ellamae Sia Kaitlyn Hoover is a 76 y.o. female with a h/o paroxysmal afib, pending ablation by Dr. Curt Bears 6/9. She is scheduled for ablation 6/8 but presented to the ER 5/16 with afib with rvr. She was given IV cardizem without conversion. She then was given flecainde 200 mg po as she refused cardioversion. She did convert. She had been taking herbal decongestants for sinus issues. In afib clinic no further afib but she is afraid that she will have another episode as she waits for ablation.  Today, she denies symptoms of palpitations, chest pain, shortness of breath, orthopnea, PND, lower extremity edema, dizziness, presyncope, syncope, or neurologic sequela. The patient is tolerating medications without difficulties and is otherwise without complaint today.   Past Medical History:  Diagnosis Date  . Anxiety   . Arthritis   . Atrial fibrillation with RVR ([place])   . Bipolar disorder ([place])   . Dysrhythmia   . GERD (gastroesophageal reflux disease)   . Hypertension   . Hypothyroidism   . Palpitations   . PAT (paroxysmal atrial tachycardia) ([place])   . Post-polio syndrome   . Sinus infection   . 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 ABDOMINAL HYSTERECTOMY    . CATARACT EXTRACTION W/ INTRAOCULAR LENS  IMPLANT, BILATERAL    . CERVICAL LAMINECTOMY    . CESAREAN SECTION     X2   . LEGS      AGE 12-11   SEVERAL SURGERIES FOR POLIO   . THYROIDECTOMY      Current Outpatient Prescriptions  Medication Sig Dispense Refill  . acyclovir (ZOVIRAX) 400 MG tablet Take 400 mg by mouth daily.     Marland Kitchen alendronate (FOSAMAX) 70 MG tablet Take 70 mg by mouth every Thursday. Take with a full glass of water on an empty stomach.     . Ascorbic Acid (VITAMIN C) 1000 MG tablet Take 1,000 mg by mouth daily.    . calcium carbonate (OSCAL) 1500 (600 Ca) MG TABS tablet Take 3,000  tablets by mouth daily.     . Coenzyme Q10 (CO Q-10) 100 MG CAPS Take 100 mg by mouth daily.     Marland Kitchen diltiazem (CARDIZEM CD) 180 MG 24 hr capsule Take 1 capsule (180 mg total) by mouth daily. 90 capsule 3  . ELIQUIS 5 MG TABS tablet TAKE 1 TABLET BY MOUTH TWICE DAILY 180 tablet 1  . FANAPT 4 MG TABS tablet Take 1 mg by mouth at bedtime.     . Ginkgo Biloba Extract 60 MG CAPS Take 240 mg by mouth daily.    Marland Kitchen levothyroxine (SYNTHROID, LEVOTHROID) 100 MCG tablet Take 100 mcg by mouth daily.    . Melatonin 3 MG TABS Take 3 mg by mouth at bedtime.    . Multiple Vitamin (MULTIVITAMIN) capsule Take 1 capsule by mouth 2 (two) times daily.    . Omega-3 Fatty Acids (FISH OIL BURP-LESS) 1000 MG CAPS Take 3,000 Units by mouth 2 (two) times daily.    Marland Kitchen omeprazole (PRILOSEC) 20 MG capsule Take 20 mg by mouth every morning.    Marland Kitchen oxybutynin (DITROPAN-XL) 10 MG 24 hr tablet Take 10 mg by mouth daily.     . polyethylene glycol (MIRALAX / GLYCOLAX) packet Take 17 g by mouth daily.     . temazepam (RESTORIL) 15 MG capsule Take 15 mg  by mouth at bedtime.     . ziprasidone (GEODON) 20 MG capsule Take 20 mg by mouth every evening.     Marland Kitchen atenolol (TENORMIN) 25 MG tablet Take 0.5 tablets (12.5 mg total) by mouth 2 (two) times daily. 30 tablet 3  . rosuvastatin (CRESTOR) 10 MG tablet Take 10 mg by mouth as directed. Take one tablet up to 4 times weekly     No current facility-administered medications for this encounter.     Allergies  Allergen Reactions  . Sulfa Antibiotics Rash    Childhood allergy  . Ciprocinonide [Fluocinolone] Rash  . Ciprofloxacin Rash    Social History   Social History  . Marital status: Divorced    Spouse name: N/A  . Number of children: N/A  . Years of education: N/A   Occupational History  . Not on file.   Social History Main Topics  . Smoking status: Former Research scientist (life sciences)  . Smokeless tobacco: Never Used  . Alcohol use Yes     Comment: GLASS WINE W/ DINNER  . Drug use: No  .  Sexual activity: Not on file   Other Topics Concern  . Not on file   Social History Narrative  . No narrative on file    Family History  Problem Relation Age of Onset  . Hypertension Mother   . Cancer Father        JAW BONE  . COPD Brother   . Atrial fibrillation Brother     ROS- All systems are reviewed and negative except as per the HPI above  Physical Exam: Vitals:   04/04/17 1121  BP: 132/68  Pulse: 68  Weight: 134 lb 6.4 oz (61 kg)  Height: 5\' 2"  (1.575 m)   Wt Readings from Last 3 Encounters:  04/04/17 134 lb 6.4 oz (61 kg)  04/02/17 135 lb (61.2 kg)  03/17/17 136 lb (61.7 kg)    Labs: Lab Results  Component Value Date   NA 141 04/02/2017   K 4.0 04/02/2017   CL 108 04/02/2017   CO2 24 04/02/2017   GLUCOSE 128 (H) 04/02/2017   BUN 10 04/02/2017   CREATININE 0.85 04/02/2017   CALCIUM 8.9 04/02/2017   MG 2.0 08/15/2014   Lab Results  Component Value Date   INR 1.05 07/31/2016   Lab Results  Component Value Date   CHOL 136 02/12/2017   HDL 46 02/12/2017   LDLCALC 67 02/12/2017   TRIG 114 02/12/2017     GEN- The patient is well appearing, alert and oriented x 3 today.   Head- normocephalic, atraumatic Eyes-  Sclera clear, conjunctiva pink Ears- hearing intact Oropharynx- clear Neck- supple, no JVP Lymph- no cervical lymphadenopathy Lungs- Clear to ausculation bilaterally, normal work of breathing Heart- Regular rate and rhythm, no murmurs, rubs or gallops, PMI not laterally displaced GI- soft, NT, ND, + BS Extremities- no clubbing, cyanosis, or edema MS- no significant deformity or atrophy Skin- no rash or lesion Psych- euthymic mood, full affect Neuro- strength and sensation are intact  EKG-NSR at 68 bpm, pr int 154 ms , qrs int 76 ms, qtc 429 ms ER records reviewed    Assessment and Plan: 1. Afib Awaiting ablation 6/8 Pt is nervous she may have another episode before procedure Recommend no further decongestants Continue dilt  180 mg daily Can add atenolol 25 mg 1/2 tab bid She will watch BP/HR and let me know if this makes either too low/slow  afib clinic as needed  Butch Penny C.  Tomeeka Plaugher, [facility] [address] [place], [place] [address] [phone]

## 2017-04-08 DIAGNOSIS — J343 Hypertrophy of nasal turbinates: Secondary | ICD-10-CM | POA: Diagnosis not present

## 2017-04-08 DIAGNOSIS — J342 Deviated nasal septum: Secondary | ICD-10-CM | POA: Diagnosis not present

## 2017-04-08 DIAGNOSIS — J310 Chronic rhinitis: Secondary | ICD-10-CM | POA: Diagnosis not present

## 2017-04-10 ENCOUNTER — Encounter: Payer: Self-pay | Admitting: Cardiology

## 2017-04-11 ENCOUNTER — Other Ambulatory Visit: Payer: Self-pay

## 2017-04-11 ENCOUNTER — Ambulatory Visit (HOSPITAL_COMMUNITY): Payer: PPO | Attending: Cardiology

## 2017-04-11 ENCOUNTER — Other Ambulatory Visit: Payer: PPO | Admitting: *Deleted

## 2017-04-11 DIAGNOSIS — I480 Paroxysmal atrial fibrillation: Secondary | ICD-10-CM

## 2017-04-11 DIAGNOSIS — Z01812 Encounter for preprocedural laboratory examination: Secondary | ICD-10-CM

## 2017-04-12 LAB — BASIC METABOLIC PANEL
BUN/Creatinine Ratio: 16 (ref 12–28)
BUN: 14 mg/dL (ref 8–27)
CALCIUM: 9.1 mg/dL (ref 8.7–10.3)
CO2: 23 mmol/L (ref 18–29)
CREATININE: 0.86 mg/dL (ref 0.57–1.00)
Chloride: 102 mmol/L (ref 96–106)
GFR, EST AFRICAN AMERICAN: 76 mL/min/{1.73_m2} (ref >59)
GFR, EST NON AFRICAN AMERICAN: 66 mL/min/{1.73_m2} (ref >59)
Glucose: 100 mg/dL — ABNORMAL HIGH (ref 65–99)
POTASSIUM: 4.4 mmol/L (ref 3.5–5.2)
Sodium: 142 mmol/L (ref 134–144)

## 2017-04-12 LAB — CBC WITH DIFFERENTIAL/PLATELET
BASOS: 1 %
Basophils Absolute: 0.1 10*3/uL (ref 0.0–0.2)
EOS (ABSOLUTE): 0.2 10*3/uL (ref 0.0–0.4)
EOS: 2 %
Hematocrit: 36.8 % (ref 34.0–46.6)
Hemoglobin: 12 g/dL (ref 11.1–15.9)
IMMATURE GRANS (ABS): 0 10*3/uL (ref 0.0–0.1)
IMMATURE GRANULOCYTES: 0 %
LYMPHS: 21 %
Lymphocytes Absolute: 1.8 10*3/uL (ref 0.7–3.1)
MCH: 29.3 pg (ref 26.6–33.0)
MCHC: 32.6 g/dL (ref 31.5–35.7)
MCV: 90 fL (ref 79–97)
Monocytes Absolute: 0.5 10*3/uL (ref 0.1–0.9)
Monocytes: 6 %
NEUTROS ABS: 5.9 10*3/uL (ref 1.4–7.0)
NEUTROS PCT: 70 %
Platelets: 306 10*3/uL (ref 150–379)
RBC: 4.09 x10E6/uL (ref 3.77–5.28)
RDW: 13.4 % (ref 12.3–15.4)
WBC: 8.4 10*3/uL (ref 3.4–10.8)

## 2017-04-17 NOTE — Addendum Note (Signed)
Encounter addended by: Sherran Needs, NP on: 04/17/2017 12:58 PM<BR>    Actions taken: LOS modified

## 2017-04-21 ENCOUNTER — Encounter (HOSPITAL_COMMUNITY): Payer: Self-pay

## 2017-04-21 ENCOUNTER — Ambulatory Visit (HOSPITAL_COMMUNITY): Admission: RE | Admit: 2017-04-21 | Payer: PPO | Source: Ambulatory Visit

## 2017-04-21 ENCOUNTER — Ambulatory Visit (HOSPITAL_COMMUNITY)
Admission: RE | Admit: 2017-04-21 | Discharge: 2017-04-21 | Disposition: A | Discharge status: Home / Self Care | Payer: PPO | Source: Ambulatory Visit | Attending: Cardiology | Admitting: Cardiology

## 2017-04-21 ENCOUNTER — Telehealth: Payer: Self-pay | Admitting: Cardiology

## 2017-04-21 DIAGNOSIS — M4726 Other spondylosis with radiculopathy, lumbar region: Secondary | ICD-10-CM | POA: Diagnosis not present

## 2017-04-21 DIAGNOSIS — I480 Paroxysmal atrial fibrillation: Secondary | ICD-10-CM | POA: Diagnosis not present

## 2017-04-21 DIAGNOSIS — M4316 Spondylolisthesis, lumbar region: Secondary | ICD-10-CM | POA: Diagnosis not present

## 2017-04-21 DIAGNOSIS — I4891 Unspecified atrial fibrillation: Secondary | ICD-10-CM | POA: Diagnosis not present

## 2017-04-21 MED ORDER — IOPAMIDOL (ISOVUE-370) INJECTION 76%
INTRAVENOUS | Status: AC
  Administered 2017-04-21: 80 mL
  Filled 2017-04-21: qty 100

## 2017-04-21 NOTE — Telephone Encounter (Signed)
Pt on abx for sinus infection.  She states she mistakenly called, b/c after calling she realized that she took last dose of abx Thursday.   Advised pt that it is still ok to proceed w/ procedure.

## 2017-04-21 NOTE — Telephone Encounter (Signed)
New Message     Pt wants to know if she can take her antibiotic the same day of the ablation

## 2017-04-25 ENCOUNTER — Ambulatory Visit (HOSPITAL_COMMUNITY)
Admission: RE | Admit: 2017-04-25 | Discharge: 2017-04-26 | Disposition: A | Discharge status: Home / Self Care | Payer: PPO | Source: Ambulatory Visit | Attending: Cardiology | Admitting: Cardiology

## 2017-04-25 ENCOUNTER — Ambulatory Visit (HOSPITAL_COMMUNITY): Payer: PPO | Admitting: Certified Registered Nurse Anesthetist

## 2017-04-25 ENCOUNTER — Encounter (HOSPITAL_COMMUNITY): Payer: Self-pay | Admitting: Certified Registered Nurse Anesthetist

## 2017-04-25 ENCOUNTER — Encounter (HOSPITAL_COMMUNITY)
Admission: RE | Disposition: A | Discharge status: Home / Self Care | Payer: Self-pay | Source: Ambulatory Visit | Attending: Cardiology

## 2017-04-25 DIAGNOSIS — I4891 Unspecified atrial fibrillation: Secondary | ICD-10-CM | POA: Diagnosis present

## 2017-04-25 DIAGNOSIS — F418 Other specified anxiety disorders: Secondary | ICD-10-CM | POA: Diagnosis not present

## 2017-04-25 DIAGNOSIS — I480 Paroxysmal atrial fibrillation: Secondary | ICD-10-CM | POA: Diagnosis not present

## 2017-04-25 DIAGNOSIS — E039 Hypothyroidism, unspecified: Secondary | ICD-10-CM | POA: Diagnosis not present

## 2017-04-25 DIAGNOSIS — M1990 Unspecified osteoarthritis, unspecified site: Secondary | ICD-10-CM | POA: Diagnosis not present

## 2017-04-25 DIAGNOSIS — K219 Gastro-esophageal reflux disease without esophagitis: Secondary | ICD-10-CM | POA: Diagnosis not present

## 2017-04-25 DIAGNOSIS — I10 Essential (primary) hypertension: Secondary | ICD-10-CM | POA: Diagnosis not present

## 2017-04-25 DIAGNOSIS — F319 Bipolar disorder, unspecified: Secondary | ICD-10-CM | POA: Diagnosis not present

## 2017-04-25 DIAGNOSIS — I471 Supraventricular tachycardia: Secondary | ICD-10-CM | POA: Insufficient documentation

## 2017-04-25 DIAGNOSIS — Z87891 Personal history of nicotine dependence: Secondary | ICD-10-CM | POA: Diagnosis not present

## 2017-04-25 HISTORY — PX: ABLATION OF DYSRHYTHMIC FOCUS: SHX254

## 2017-04-25 HISTORY — PX: ATRIAL FIBRILLATION ABLATION: EP1191

## 2017-04-25 LAB — POCT ACTIVATED CLOTTING TIME
ACTIVATED CLOTTING TIME: 412 s
ACTIVATED CLOTTING TIME: 373 s
Activated Clotting Time: 153 seconds

## 2017-04-25 SURGERY — ATRIAL FIBRILLATION ABLATION
Anesthesia: General

## 2017-04-25 MED ORDER — LORATADINE 10 MG PO TABS
10.0000 mg | ORAL_TABLET | Freq: Every day | ORAL | Status: DC
  Administered 2017-04-26: 10 mg via ORAL
  Filled 2017-04-25: qty 1

## 2017-04-25 MED ORDER — ACYCLOVIR 400 MG PO TABS
400.0000 mg | ORAL_TABLET | Freq: Every day | ORAL | Status: DC
  Administered 2017-04-26: 400 mg via ORAL
  Filled 2017-04-25: qty 1

## 2017-04-25 MED ORDER — MULTIVITAMINS PO CAPS
1.0000 | ORAL_CAPSULE | Freq: Two times a day (BID) | ORAL | Status: DC
  Administered 2017-04-25: 1 via ORAL
  Filled 2017-04-25 (×2): qty 1

## 2017-04-25 MED ORDER — HEPARIN SODIUM (PORCINE) 1000 UNIT/ML IJ SOLN
INTRAMUSCULAR | Status: DC | PRN
  Administered 2017-04-25: 14000 [IU] via INTRAVENOUS

## 2017-04-25 MED ORDER — MELATONIN 3 MG PO TABS: 3.0000 mg | ORAL_TABLET | Freq: Every day | ORAL | Status: DC

## 2017-04-25 MED ORDER — CO Q-10 100 MG PO CAPS: 100.0000 mg | ORAL_CAPSULE | Freq: Every day | ORAL | Status: DC

## 2017-04-25 MED ORDER — FISH OIL BURP-LESS 1000 MG PO CAPS: 3000.0000 [IU] | ORAL_CAPSULE | Freq: Two times a day (BID) | ORAL | Status: DC

## 2017-04-25 MED ORDER — VITAMIN C 500 MG PO TABS
1000.0000 mg | ORAL_TABLET | Freq: Every day | ORAL | Status: DC
  Administered 2017-04-26: 1000 mg via ORAL
  Filled 2017-04-25: qty 2

## 2017-04-25 MED ORDER — PROTAMINE SULFATE 10 MG/ML IV SOLN
INTRAVENOUS | Status: DC | PRN
  Administered 2017-04-25: 40 mg via INTRAVENOUS

## 2017-04-25 MED ORDER — ONDANSETRON HCL 4 MG/2ML IJ SOLN
INTRAMUSCULAR | Status: DC | PRN
Start: 2017-04-25 — End: 2017-04-25
  Administered 2017-04-25: 4 mg via INTRAVENOUS

## 2017-04-25 MED ORDER — APIXABAN 5 MG PO TABS
5.0000 mg | ORAL_TABLET | Freq: Two times a day (BID) | ORAL | Status: DC
  Administered 2017-04-25 – 2017-04-26 (×2): 5 mg via ORAL
  Filled 2017-04-25 (×2): qty 1

## 2017-04-25 MED ORDER — BUPIVACAINE HCL (PF) 0.25 % IJ SOLN
INTRAMUSCULAR | Status: DC | PRN
  Administered 2017-04-25: 40 mL

## 2017-04-25 MED ORDER — ILOPERIDONE 4 MG PO TABS: 1.0000 mg | ORAL_TABLET | Freq: Every day | ORAL | Status: DC

## 2017-04-25 MED ORDER — ROSUVASTATIN CALCIUM 10 MG PO TABS
10.0000 mg | ORAL_TABLET | Freq: Every day | ORAL | Status: DC
  Administered 2017-04-25: 10 mg via ORAL

## 2017-04-25 MED ORDER — PHENYLEPHRINE HCL 10 MG/ML IJ SOLN
INTRAVENOUS | Status: DC | PRN
  Administered 2017-04-25: 15 ug/min via INTRAVENOUS

## 2017-04-25 MED ORDER — OMEGA-3-ACID ETHYL ESTERS 1 G PO CAPS
3.0000 g | ORAL_CAPSULE | Freq: Every day | ORAL | Status: DC
  Administered 2017-04-26: 3 g via ORAL
  Filled 2017-04-25: qty 3

## 2017-04-25 MED ORDER — ZIPRASIDONE HCL 20 MG PO CAPS
20.0000 mg | ORAL_CAPSULE | Freq: Every day | ORAL | Status: DC
  Administered 2017-04-25: 20 mg via ORAL
  Filled 2017-04-25: qty 1

## 2017-04-25 MED ORDER — ROCURONIUM BROMIDE 100 MG/10ML IV SOLN
INTRAVENOUS | Status: DC | PRN
  Administered 2017-04-25: 50 mg via INTRAVENOUS

## 2017-04-25 MED ORDER — PANTOPRAZOLE SODIUM 40 MG PO TBEC
80.0000 mg | DELAYED_RELEASE_TABLET | Freq: Every day | ORAL | Status: DC
  Administered 2017-04-26: 80 mg via ORAL
  Filled 2017-04-25: qty 2

## 2017-04-25 MED ORDER — GINKGO BILOBA 120 MG PO TABS: 240.0000 mg | ORAL_TABLET | Freq: Every day | ORAL | Status: DC

## 2017-04-25 MED ORDER — LIDOCAINE HCL (CARDIAC) 20 MG/ML IV SOLN
INTRAVENOUS | Status: DC | PRN
  Administered 2017-04-25: 30 mg via INTRATRACHEAL

## 2017-04-25 MED ORDER — ATENOLOL 25 MG PO TABS
12.5000 mg | ORAL_TABLET | Freq: Two times a day (BID) | ORAL | Status: DC
  Administered 2017-04-25 – 2017-04-26 (×2): 12.5 mg via ORAL
  Filled 2017-04-25 (×3): qty 1

## 2017-04-25 MED ORDER — DOBUTAMINE IN D5W 4-5 MG/ML-% IV SOLN
INTRAVENOUS | Status: DC | PRN
  Administered 2017-04-25: 20 ug/kg/min via INTRAVENOUS

## 2017-04-25 MED ORDER — TEMAZEPAM 15 MG PO CAPS
15.0000 mg | ORAL_CAPSULE | Freq: Every day | ORAL | Status: DC
  Administered 2017-04-25: 15 mg via ORAL
  Filled 2017-04-25: qty 1

## 2017-04-25 MED ORDER — PROPOFOL 10 MG/ML IV BOLUS
INTRAVENOUS | Status: DC | PRN
  Administered 2017-04-25: 150 mg via INTRAVENOUS

## 2017-04-25 MED ORDER — ONDANSETRON HCL 4 MG/2ML IJ SOLN: 4.0000 mg | Freq: Four times a day (QID) | INTRAMUSCULAR | Status: DC | PRN

## 2017-04-25 MED ORDER — SUGAMMADEX SODIUM 200 MG/2ML IV SOLN
INTRAVENOUS | Status: DC | PRN
  Administered 2017-04-25: 150 mg via INTRAVENOUS

## 2017-04-25 MED ORDER — AMOXICILLIN-POT CLAVULANATE 875-125 MG PO TABS
1.0000 | ORAL_TABLET | Freq: Two times a day (BID) | ORAL | Status: DC
  Administered 2017-04-25 – 2017-04-26 (×2): 1 via ORAL
  Filled 2017-04-25 (×3): qty 1

## 2017-04-25 MED ORDER — FENTANYL CITRATE (PF) 100 MCG/2ML IJ SOLN
INTRAMUSCULAR | Status: DC | PRN
  Administered 2017-04-25: 100 ug via INTRAVENOUS

## 2017-04-25 MED ORDER — RIBOSE (D) POWD: 2.0000 g | Freq: Every day | Status: DC

## 2017-04-25 MED ORDER — HEPARIN (PORCINE) IN NACL 2-0.9 UNIT/ML-% IJ SOLN
INTRAMUSCULAR | Status: AC | PRN
  Administered 2017-04-25 (×3): 500 mL

## 2017-04-25 MED ORDER — SODIUM CHLORIDE 0.9% FLUSH: 3.0000 mL | INTRAVENOUS | Status: DC | PRN

## 2017-04-25 MED ORDER — CALCIUM CITRATE 950 (200 CA) MG PO TABS
200.0000 mg | ORAL_TABLET | Freq: Every day | ORAL | Status: DC
  Administered 2017-04-26: 200 mg via ORAL
  Filled 2017-04-25: qty 1

## 2017-04-25 MED ORDER — DILTIAZEM HCL ER COATED BEADS 180 MG PO CP24
180.0000 mg | ORAL_CAPSULE | Freq: Every day | ORAL | Status: DC
  Administered 2017-04-26: 180 mg via ORAL
  Filled 2017-04-25: qty 1

## 2017-04-25 MED ORDER — CALCIUM CITRATE-VITAMIN D 250-200 MG-UNIT PO TABS: ORAL_TABLET | Freq: Every evening | ORAL | Status: DC

## 2017-04-25 MED ORDER — ACETAMINOPHEN 325 MG PO TABS: 650.0000 mg | ORAL_TABLET | ORAL | Status: DC | PRN

## 2017-04-25 MED ORDER — ALENDRONATE SODIUM 70 MG PO TABS: 70.0000 mg | ORAL_TABLET | ORAL | Status: DC

## 2017-04-25 MED ORDER — LACTATED RINGERS IV SOLN
INTRAVENOUS | Status: DC | PRN
  Administered 2017-04-25: 12:00:00 via INTRAVENOUS

## 2017-04-25 MED ORDER — LEVOTHYROXINE SODIUM 100 MCG PO TABS
100.0000 ug | ORAL_TABLET | Freq: Every day | ORAL | Status: DC
  Administered 2017-04-26: 100 ug via ORAL
  Filled 2017-04-25: qty 1

## 2017-04-25 MED ORDER — SODIUM CHLORIDE 0.9% FLUSH
3.0000 mL | Freq: Two times a day (BID) | INTRAVENOUS | Status: DC
  Administered 2017-04-25 – 2017-04-26 (×2): 3 mL via INTRAVENOUS

## 2017-04-25 MED ORDER — HEPARIN SODIUM (PORCINE) 1000 UNIT/ML IJ SOLN
INTRAMUSCULAR | Status: DC | PRN
  Administered 2017-04-25: 1000 [IU] via INTRAVENOUS

## 2017-04-25 MED ORDER — SODIUM CHLORIDE 0.9 % IV SOLN: 250.0000 mL | INTRAVENOUS | Status: DC | PRN

## 2017-04-25 MED ORDER — OXYBUTYNIN CHLORIDE ER 5 MG PO TB24
5.0000 mg | ORAL_TABLET | Freq: Every day | ORAL | Status: DC
  Administered 2017-04-26: 5 mg via ORAL
  Filled 2017-04-25: qty 1

## 2017-04-25 MED ORDER — POLYETHYLENE GLYCOL 3350 17 G PO PACK
17.0000 g | PACK | Freq: Every day | ORAL | Status: DC
  Administered 2017-04-26: 17 g via ORAL
  Filled 2017-04-25: qty 1

## 2017-04-25 SURGICAL SUPPLY — 17 items
BAG SNAP BAND KOVER 36X36 (MISCELLANEOUS) ×3 IMPLANT
BLANKET WARM UNDERBOD FULL ACC (MISCELLANEOUS) ×3 IMPLANT
CATH SMTCH THERMOCOOL SF DF (CATHETERS) ×2 IMPLANT
CATH SOUNDSTAR ECO REPROCESSED (CATHETERS) ×2 IMPLANT
CATH VARIABLE LASSO NAV 2515 (CATHETERS) ×2 IMPLANT
CATH WEBSTER BI DIR CS D-F CRV (CATHETERS) ×2 IMPLANT
NDL TRANSSEPTAL BRK 98CM (NEEDLE) IMPLANT
NEEDLE TRANSSEPTAL BRK 98CM (NEEDLE) ×3 IMPLANT
PACK EP LATEX FREE (CUSTOM PROCEDURE TRAY) ×3
PACK EP LF (CUSTOM PROCEDURE TRAY) ×1 IMPLANT
PAD DEFIB LIFELINK (PAD) ×3 IMPLANT
PATCH CARTO3 (PAD) ×2 IMPLANT
SHEATH AVANTI 11F 11CM (SHEATH) ×2 IMPLANT
SHEATH PINNACLE 7F 10CM (SHEATH) ×2 IMPLANT
SHEATH PINNACLE 8F 10CM (SHEATH) ×4 IMPLANT
SHEATH PINNACLE 9F 10CM (SHEATH) ×4 IMPLANT
TUBING SMART ABLATE COOLFLOW (TUBING) ×2 IMPLANT

## 2017-04-25 NOTE — H&P (Signed)
Kaitlyn Hoover is a 76 y.o. female with a history of paroxysmal atrial fibrillation. She presents to the hospital for ablation. On exam, regular rhythm, no murmurs, lungs clear. She took her last dose of Eliquis last night. Risks and benefits of the procedure were discussed. Risks include but not limited to bleeding, tamponade, heart block, stroke, damage to surrounding organs, and death. She understands the risks and has agreed to the procedure.  Aquila Delaughter Curt Bears, MD 04/25/2017 11:26 AM

## 2017-04-25 NOTE — Progress Notes (Signed)
Site area: 2 lt fv sheaths Site Prior to Removal:  Level 0 Pressure Applied For:  20 minutes Manual:   yes Patient Status During Pull:  stable Post Pull Site:  Level  0 Post Pull Instructions Given:  yes Post Pull Pulses Present: palpable Dressing Applied:  Gauze and tegaderm Bedrest begins @ 1550 Comments:  IV saline locked

## 2017-04-25 NOTE — Anesthesia Postprocedure Evaluation (Signed)
Anesthesia Post Note  Patient: Kaitlyn Hoover  Procedure(s) Performed: Procedure(s) (LRB): Atrial Fibrillation Ablation (N/A)     Patient location during evaluation: PACU Anesthesia Type: General Level of consciousness: awake and alert Pain management: pain level controlled Vital Signs Assessment: post-procedure vital signs reviewed and stable Respiratory status: spontaneous breathing, nonlabored ventilation and respiratory function stable Cardiovascular status: blood pressure returned to baseline and stable Postop Assessment: no signs of nausea or vomiting Anesthetic complications: no    Last Vitals:  Vitals:   04/25/17 1545 04/25/17 1550  BP: (!) 157/48   Pulse: 63   Resp: 14   Temp:  36.2 C    Last Pain:  Vitals:   04/25/17 1550  TempSrc: Temporal                 Kiefer Opheim,W. EDMOND

## 2017-04-25 NOTE — Discharge Summary (Addendum)
ELECTROPHYSIOLOGY PROCEDURE DISCHARGE SUMMARY    Patient ID: Kaitlyn Hoover,  MRN: [id number], DOB/AGE: Mar 28, 1941 76 y.o.  Admit date: 04/25/2017 Discharge date: 04/26/2017  Primary Care Physician: [name], MD Primary Cardiologist: Marlou Porch Electrophysiologist: Curt Bears  Primary Discharge Diagnosis:  SVT and paroxysmal atrial fibrillation status post ablation this admission  Secondary Discharge Diagnosis:  1.  HTN 2.  Bipolar 3.  GERD 4.  HTN 5.  Hypothyroidism  Procedures This Admission:  1.  Electrophysiology study and radiofrequency catheter ablation on 04/25/17 by Dr Curt Bears.  This study demonstrated sinus rhythm upon presentation and successful electrical isolation and anatomical encircling of all four pulmonary veins with radiofrequency current. There were no inducible arrhythmias following ablation both on and off of dobutamine and no early apparent complications.  Brief HPI: Kaitlyn Hoover is a 76 y.o. female with a history of paroxysmal atrial fibrillation and SVT.  They have failed medical therapy with Diltiazem and atenolol. Risks, benefits, and alternatives to catheter ablation of atrial fibrillation were reviewed with the patient who wished to proceed.  The patient underwent cardiac CT prior to the procedure which demonstrated no LAA thrombus.    Hospital Course:  The patient was admitted and underwent EPS/RFCA of atrial fibrillation with details as outlined above.  They were monitored on telemetry overnight which demonstrated NSR.  Groin was without complication on the day of discharge.  The patient was examined and considered to be stable for discharge.  Wound care and restrictions were reviewed with the patient.  The patient will be seen back by Roderic Palau, NP in 4 weeks and Dr Curt Bears in 12 weeks for post ablation follow up.   This patients CHA2DS2-VASc Score and unadjusted Ischemic Stroke Rate (% per year) is equal to 4.8 % stroke rate/year from a score of  4 Above score calculated as 1 point each if present [CHF, HTN, DM, Vascular=MI/PAD/Aortic Plaque, Age if 65-74, or Female] Above score calculated as 2 points each if present [Age > 75, or Stroke/TIA/TE]   Physical Exam: Vitals:   04/25/17 2021 04/25/17 2051 04/25/17 2150 04/26/17 0456  BP: 135/67 128/61 136/61 (!) 131/59  Pulse: 72 74 72 77  Resp: 16 16 20 18   Temp:    98.6 F (37 C)  TempSrc:    Oral  SpO2: 98% 99% 96% 97%  Weight:    130 lb 6.4 oz (59.1 kg)  Height:        GEN- The patient is well appearing, alert and oriented x 3 today.   HEENT: normocephalic, atraumatic; sclera clear, conjunctiva pink; hearing intact; oropharynx clear; neck supple  Lungs- Clear to ausculation bilaterally, normal work of breathing.  No wheezes, rales, rhonchi Heart- Regular rate and rhythm, no murmurs, rubs or gallops  GI- soft, non-tender, non-distended, bowel sounds present  Extremities- no clubbing, cyanosis, or edema; DP/PT/radial pulses 2+ bilaterally, groin without hematoma/bruit MS- no significant deformity or atrophy Skin- warm and dry, no rash or lesion Psych- euthymic mood, full affect  Neuro- strength and sensation are intact   Labs:   Lab Results  Component Value Date   WBC 8.4 04/11/2017   HGB 12.0 04/11/2017   HCT 36.8 04/11/2017   MCV 90 04/11/2017   PLT 306 04/11/2017   No results for input(s): NA, K, CL, CO2, BUN, CREATININE, CALCIUM, PROT, BILITOT, ALKPHOS, ALT, AST, GLUCOSE in the last 168 hours.  Invalid input(s): LABALBU   Discharge Medications:  Allergies as of 04/26/2017      Reactions  Sulfa Antibiotics Rash   Childhood allergy   Ciprocinonide [fluocinolone] Rash   Ciprofloxacin Rash      Medication List    STOP taking these medications   amoxicillin-clavulanate 875-125 MG tablet Commonly known as:  AUGMENTIN   diltiazem 180 MG 24 hr capsule Commonly known as:  CARDIZEM CD     TAKE these medications   acyclovir 400 MG tablet Commonly known  as:  ZOVIRAX Take 400 mg by mouth daily.   alendronate 70 MG tablet Commonly known as:  FOSAMAX Take 70 mg by mouth every Thursday. Take with a full glass of water on an empty stomach.   atenolol 25 MG tablet Commonly known as:  TENORMIN Take 0.5 tablets (12.5 mg total) by mouth 2 (two) times daily.   CALCIUM CITRATE + D PO Take 2 tablets by mouth every evening.   Co Q-10 100 MG Caps Take 100 mg by mouth daily.   ELIQUIS 5 MG Tabs tablet Generic drug:  apixaban TAKE 1 TABLET BY MOUTH TWICE DAILY   FANAPT 4 MG Tabs tablet Generic drug:  iloperidone Take 1 mg by mouth daily. Quarters the tablet   fexofenadine 180 MG tablet Commonly known as:  ALLEGRA Take 180 mg by mouth daily.   FISH OIL BURP-LESS 1000 MG Caps Take 3,000 Units by mouth 2 (two) times daily.   fluticasone 50 MCG/ACT nasal spray Commonly known as:  FLONASE Place 2 sprays into both nostrils daily.   Ginkgo Biloba 120 MG Tabs Take 240 mg by mouth daily.   levothyroxine 100 MCG tablet Commonly known as:  SYNTHROID, LEVOTHROID Take 100 mcg by mouth daily.   Melatonin 3 MG Tabs Take 3 mg by mouth at bedtime.   multivitamin capsule Take 1 capsule by mouth 2 (two) times daily.   omeprazole 20 MG capsule Commonly known as:  PRILOSEC Take 20 mg by mouth every morning.   oxybutynin 5 MG 24 hr tablet Commonly known as:  DITROPAN-XL Take 5 mg by mouth daily.   polyethylene glycol packet Commonly known as:  MIRALAX / GLYCOLAX Take 17 g by mouth daily.   Ribose (D) Powd 2 g by Does not apply route daily.   rosuvastatin 10 MG tablet Commonly known as:  CRESTOR Take 10 mg by mouth as directed. Take one tablet up to 4 times weekly , Sunday Monday, Wednesday and Friday   temazepam 15 MG capsule Commonly known as:  RESTORIL Take 15 mg by mouth at bedtime.   vitamin C 1000 MG tablet Take 1,000 mg by mouth daily.   ziprasidone 20 MG capsule Commonly known as:  GEODON Take 20 mg by mouth at  bedtime.       Disposition:   Follow-up Information    [facility] Follow up on 05/26/2017.   Specialty:  Cardiology Why:  at [facility] information: [address] [id number] Danice G[place] [address] [phone]       Constance Haw, MD Follow up on 07/30/2017.   Specialty:  Cardiology Why:  at 10:30AM Contact information: [place] [address] [phone]           Duration of Discharge Encounter: Greater than 30 minutes including physician time.  Signed, Angelena Form PA-C  04/26/2017 11:14 AM  Seen and examined and agree with above  Will discontinue dilt which had been used for Afib

## 2017-04-25 NOTE — Transfer of Care (Signed)
Immediate Anesthesia Transfer of Care Note  Patient: Kaitlyn Hoover  Procedure(s) Performed: Procedure(s): Atrial Fibrillation Ablation (N/A)  Patient Location: PACU  Cath lab   Anesthesia Type:General  Level of Consciousness: awake, alert , oriented and patient cooperative  Airway & Oxygen Therapy: Patient Spontanous Breathing and Patient connected to nasal cannula oxygen  Post-op Assessment: Report given to RN and Post -op Vital signs reviewed and stable  Post vital signs: Reviewed and stable  Last Vitals:  Vitals:   04/25/17 0905  BP: (!) 148/50  Pulse: (!) 51  Resp: 18  Temp: 36.7 C    Last Pain:  Vitals:   04/25/17 0905  TempSrc: Oral         Complications: No apparent anesthesia complications

## 2017-04-25 NOTE — Progress Notes (Signed)
Site area: 2 rt fv sheaths Site Prior to Removal:  Level 0 Pressure Applied For: 20 minutes Manual:   yes Patient Status During Pull:  stable Post Pull Site:  Level  0 Post Pull Instructions Given:  yes Post Pull Pulses Present: yes Dressing Applied:  Gauze and tegaderm Bedrest begins @  Comments:

## 2017-04-25 NOTE — Anesthesia Preprocedure Evaluation (Addendum)
Anesthesia Evaluation  Patient identified by MRN, date of birth, ID band Patient awake    Reviewed: Allergy & Precautions, H&P , NPO status , Patient's Chart, lab work & pertinent test results  Airway Mallampati: II  TM Distance: >3 FB Neck ROM: Full    Dental no notable dental hx. (+) Partial Upper, Dental Advisory Given   Pulmonary neg pulmonary ROS, former smoker,    Pulmonary exam normal breath sounds clear to auscultation       Cardiovascular hypertension, Pt. on medications and Pt. on home beta blockers + dysrhythmias Atrial Fibrillation  Rhythm:Irregular Rate:Normal     Neuro/Psych Anxiety Bipolar Disorder negative neurological ROS  negative psych ROS   GI/Hepatic Neg liver ROS, GERD  Medicated and Controlled,  Endo/Other  Hypothyroidism   Renal/GU negative Renal ROS  negative genitourinary   Musculoskeletal  (+) Arthritis , Osteoarthritis,    Abdominal   Peds  Hematology negative hematology ROS (+)   Anesthesia Other Findings   Reproductive/Obstetrics negative OB ROS                            Anesthesia Physical Anesthesia Plan  ASA: III  Anesthesia Plan: General   Post-op Pain Management:    Induction: Intravenous  PONV Risk Score and Plan: 4 or greater and Ondansetron, Dexamethasone, Propofol, Midazolam and Treatment may vary due to age  Airway Management Planned: Oral ETT  Additional Equipment:   Intra-op Plan:   Post-operative Plan: Extubation in OR  Informed Consent: I have reviewed the patients History and Physical, chart, labs and discussed the procedure including the risks, benefits and alternatives for the proposed anesthesia with the patient or authorized representative who has indicated his/her understanding and acceptance.   Dental advisory given  Plan Discussed with: CRNA  Anesthesia Plan Comments:        Anesthesia Quick Evaluation

## 2017-04-25 NOTE — Anesthesia Procedure Notes (Signed)
Procedure Name: Intubation Date/Time: 04/25/2017 12:07 PM Performed by: Shirlyn Goltz Pre-anesthesia Checklist: Patient identified, Emergency Drugs available, Suction available and Patient being monitored Patient Re-evaluated:Patient Re-evaluated prior to inductionOxygen Delivery Method: Circle system utilized Preoxygenation: Pre-oxygenation with 100% oxygen Intubation Type: IV induction Ventilation: Mask ventilation without difficulty Laryngoscope Size: Mac and 3 Grade View: Grade I Tube type: Oral Tube size: 7.0 mm Number of attempts: 1 Airway Equipment and Method: Stylet Placement Confirmation: ETT inserted through vocal cords under direct vision,  positive ETCO2 and breath sounds checked- equal and bilateral Secured at: 21 cm Tube secured with: Tape Dental Injury: Teeth and Oropharynx as per pre-operative assessment

## 2017-04-26 DIAGNOSIS — M1990 Unspecified osteoarthritis, unspecified site: Secondary | ICD-10-CM | POA: Diagnosis not present

## 2017-04-26 DIAGNOSIS — F319 Bipolar disorder, unspecified: Secondary | ICD-10-CM | POA: Diagnosis not present

## 2017-04-26 DIAGNOSIS — I480 Paroxysmal atrial fibrillation: Secondary | ICD-10-CM | POA: Diagnosis not present

## 2017-04-26 DIAGNOSIS — I10 Essential (primary) hypertension: Secondary | ICD-10-CM | POA: Diagnosis not present

## 2017-04-26 DIAGNOSIS — K219 Gastro-esophageal reflux disease without esophagitis: Secondary | ICD-10-CM | POA: Diagnosis not present

## 2017-04-26 DIAGNOSIS — E039 Hypothyroidism, unspecified: Secondary | ICD-10-CM | POA: Diagnosis not present

## 2017-04-26 DIAGNOSIS — I471 Supraventricular tachycardia: Secondary | ICD-10-CM | POA: Diagnosis not present

## 2017-04-26 DIAGNOSIS — Z87891 Personal history of nicotine dependence: Secondary | ICD-10-CM | POA: Diagnosis not present

## 2017-04-26 MED ORDER — ADULT MULTIVITAMIN W/MINERALS CH
1.0000 | ORAL_TABLET | Freq: Every day | ORAL | Status: DC
  Administered 2017-04-26: 1 via ORAL
  Filled 2017-04-26: qty 1

## 2017-04-26 NOTE — Care Management Note (Signed)
Case Management Note  Patient Details  Name: Kaitlyn Hoover MRN: [id number] Date of Birth: 04-Mar-1941  Subjective/Objective:     Pt admitted with paroxysmal A fib  Action/Plan:    PTA independent from home with brother.  Pt has PCP and denied barriers to obtaining/paying for medications.  No CM needs prior to discharge   Expected Discharge Date:  04/26/17               Expected Discharge Plan:  Home/Self Care  In-House Referral:     Discharge planning Services  CM Consult  Post Acute Care Choice:    Choice offered to:     DME Arranged:    DME Agency:     HH Arranged:    [place] Agency:     Status of Service:  Completed, signed off  If discussed at [employer] of Stay Meetings, dates discussed:    Additional Comments:  Maryclare Labrador, RN 04/26/2017, 11:54 AM

## 2017-04-26 NOTE — Progress Notes (Signed)
Patient received all discharge information and education. Patient has verbalized understanding.

## 2017-04-28 ENCOUNTER — Telehealth: Payer: Self-pay | Admitting: Cardiology

## 2017-04-28 ENCOUNTER — Encounter (HOSPITAL_COMMUNITY): Payer: Self-pay | Admitting: Cardiology

## 2017-04-28 MED ORDER — ATENOLOL 25 MG PO TABS: 25.0000 mg | ORAL_TABLET | Freq: Two times a day (BID) | ORAL | 3 refills | Status: DC

## 2017-04-28 NOTE — Telephone Encounter (Signed)
New Message     The hospital discontinued her  Diltiazem and she doesn't think she is taking enough  atenolol (TENORMIN) 25 MG tablet Take 0.5 tablets (12.5 mg total) by mouth 2 (two) times daily.   To balance it out.  (she states she is still takes the Diltiazem because she doesn't feel comfortable stopping it)

## 2017-04-28 NOTE — Telephone Encounter (Signed)
Recent AFib RFA on 6/8 and was d/c with medication changes. Pt asking to take Atenolol 25 mg BID.  States she was instructed to stop Diltiazem and restart Atenolol at 12.5 mg BID.  She believes that 12.5 will not be enough and would like to take 25 mg.  States that Atenolol works better for her tachycardia and that she used to take 50 mg.  Pt instructed ok to take 25 mg BID, per Dr. Curt Bears.  She will monitor BP/HR and call if dropping/elevated/symptomatic.

## 2017-05-12 ENCOUNTER — Telehealth: Payer: Self-pay | Admitting: Cardiology

## 2017-05-12 NOTE — Telephone Encounter (Signed)
Pt calling because she feels like her BP has been running too high for her.  It has been 150-155/? And her HR has been in the 70s since being changed from Diltiazem to Atenolol.  In the past when she was on Atenolol she was on 50 mg BID and she feels as though that's how much she should be taking.  Advised Dr Marlou Porch not in the office however I will forward this information to him for review and orders.  Aware I will c/b once I him from him.

## 2017-05-12 NOTE — Telephone Encounter (Signed)
New message  Pt c/o BP issue: STAT if pt c/o blurred vision, one-sided weakness or slurred speech  1. What are your last 5 BP readings? Per pt bp is in the 150s top #...Marland KitchenMarland KitchenMarland Kitchen hr 70  2. Are you having any other symptoms (ex. Dizziness, headache, blurred vision, passed out)? No   3. What is your BP issue? Per pt would like to increase dosage from 25mg  to 50mg . Pt would like to know if it would be okay to do so.

## 2017-05-15 MED ORDER — ATENOLOL 50 MG PO TABS: 50.0000 mg | ORAL_TABLET | Freq: Two times a day (BID) | ORAL | 3 refills | Status: DC

## 2017-05-15 NOTE — Telephone Encounter (Signed)
Pt aware and agreeable to plan.  Rx sent into [facility] as requested.  She has a f/u appt scheduled with Roderic Palau 7/9 at 11 am.

## 2017-05-15 NOTE — Telephone Encounter (Signed)
Increase atenolol to 50mg  PO BID Have her follow up with Katie in 2-4 weeks, APP to make sure this is working Candee Furbish, MD

## 2017-05-26 ENCOUNTER — Ambulatory Visit (HOSPITAL_COMMUNITY)
Admission: RE | Admit: 2017-05-26 | Discharge: 2017-05-26 | Disposition: A | Discharge status: Home / Self Care | Payer: PPO | Source: Ambulatory Visit | Attending: Nurse Practitioner | Admitting: Nurse Practitioner

## 2017-05-26 ENCOUNTER — Encounter (HOSPITAL_COMMUNITY): Payer: Self-pay | Admitting: Nurse Practitioner

## 2017-05-26 VITALS — BP 110/62 | HR 61 | Ht 62.0 in | Wt 132.8 lb

## 2017-05-26 DIAGNOSIS — Z79899 Other long term (current) drug therapy: Secondary | ICD-10-CM | POA: Diagnosis not present

## 2017-05-26 DIAGNOSIS — I10 Essential (primary) hypertension: Secondary | ICD-10-CM | POA: Diagnosis not present

## 2017-05-26 DIAGNOSIS — Z7901 Long term (current) use of anticoagulants: Secondary | ICD-10-CM | POA: Insufficient documentation

## 2017-05-26 DIAGNOSIS — Z87891 Personal history of nicotine dependence: Secondary | ICD-10-CM | POA: Diagnosis not present

## 2017-05-26 DIAGNOSIS — F419 Anxiety disorder, unspecified: Secondary | ICD-10-CM | POA: Insufficient documentation

## 2017-05-26 DIAGNOSIS — I480 Paroxysmal atrial fibrillation: Secondary | ICD-10-CM

## 2017-05-26 DIAGNOSIS — F319 Bipolar disorder, unspecified: Secondary | ICD-10-CM | POA: Insufficient documentation

## 2017-05-26 DIAGNOSIS — K219 Gastro-esophageal reflux disease without esophagitis: Secondary | ICD-10-CM | POA: Diagnosis not present

## 2017-05-26 NOTE — Progress Notes (Signed)
Primary Care Physician: [name], MD Referring Physician: ER f/u EP: Dr. Ellamae Sia Kaitlyn Hoover is a 76 y.o. female with a h/o paroxysmal afib, pending ablation by Dr. Curt Bears 6/9. She is scheduled for ablation 6/8 but presented to the ER 5/16 with afib with rvr. She was given IV cardizem without conversion. She then was given flecainde 200 mg po as she refused cardioversion. She did convert. She had been taking herbal decongestants for sinus issues. In afib clinic no further afib but she is afraid that she will have another episode as she waits for ablation.  F/u in the afib clinic 7/9, one month s/p ablation. She has not noted any afib. No swallowing or groin issues. She is pleased with the outcome of the procedure. No missed doses of eliquis.  Today, she denies symptoms of palpitations, chest pain, shortness of breath, orthopnea, PND, lower extremity edema, dizziness, presyncope, syncope, or neurologic sequela. The patient is tolerating medications without difficulties and is otherwise without complaint today.   Past Medical History:  Diagnosis Date  . Anxiety   . Arthritis   . Atrial fibrillation with RVR ([place])   . Bipolar disorder ([place])   . Dysrhythmia   . GERD (gastroesophageal reflux disease)   . Hypertension   . Hypothyroidism   . Palpitations   . PAT (paroxysmal atrial tachycardia) ([place])   . Post-polio syndrome   . Sinus infection   . 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 ABDOMINAL HYSTERECTOMY    . ABLATION OF DYSRHYTHMIC FOCUS  04/25/2017  . ATRIAL FIBRILLATION ABLATION N/A 04/25/2017   Procedure: Atrial Fibrillation Ablation;  Surgeon: Constance Haw, MD;  Location[facility];  Service: Cardiovascular;  Laterality: N/A;  . CATARACT EXTRACTION W/ INTRAOCULAR LENS  IMPLANT, BILATERAL    . CERVICAL LAMINECTOMY    . CESAREAN SECTION     X2   . LEGS      AGE 20-11   SEVERAL SURGERIES FOR POLIO   .  THYROIDECTOMY      Current Outpatient Prescriptions  Medication Sig Dispense Refill  . acyclovir (ZOVIRAX) 400 MG tablet Take 400 mg by mouth daily.     Marland Kitchen alendronate (FOSAMAX) 70 MG tablet Take 70 mg by mouth every Thursday. Take with a full glass of water on an empty stomach.     . Ascorbic Acid (VITAMIN C) 1000 MG tablet Take 1,000 mg by mouth daily.    Marland Kitchen atenolol (TENORMIN) 50 MG tablet Take 1 tablet (50 mg total) by mouth 2 (two) times daily. 180 tablet 3  . Calcium Citrate-Vitamin D (CALCIUM CITRATE + D PO) Take 2 tablets by mouth every evening.    . Coenzyme Q10 (CO Q-10) 100 MG CAPS Take 100 mg by mouth daily.     Marland Kitchen D-Ribose (RIBOSE, D,) POWD 2 g by Does not apply route daily.    Marland Kitchen ELIQUIS 5 MG TABS tablet TAKE 1 TABLET BY MOUTH TWICE DAILY 180 tablet 1  . fexofenadine (ALLEGRA) 180 MG tablet Take 180 mg by mouth daily.    . fluticasone (FLONASE) 50 MCG/ACT nasal spray Place 2 sprays into both nostrils daily.    . Ginkgo Biloba 120 MG TABS Take 240 mg by mouth daily.    Marland Kitchen iloperidone (FANAPT) 4 MG TABS tablet Take 1 mg by mouth daily. Quarters the tablet    . levothyroxine (SYNTHROID, LEVOTHROID) 100 MCG tablet  Take 100 mcg by mouth daily.    . Melatonin 3 MG TABS Take 3 mg by mouth at bedtime.    . Multiple Vitamin (MULTIVITAMIN) capsule Take 1 capsule by mouth 2 (two) times daily.    . Omega-3 Fatty Acids (FISH OIL BURP-LESS) 1000 MG CAPS Take 3,000 Units by mouth 2 (two) times daily.    Marland Kitchen omeprazole (PRILOSEC) 20 MG capsule Take 20 mg by mouth every morning.    Marland Kitchen oxybutynin (DITROPAN-XL) 5 MG 24 hr tablet Take 5 mg by mouth daily.    . polyethylene glycol (MIRALAX / GLYCOLAX) packet Take 17 g by mouth daily.     . rosuvastatin (CRESTOR) 10 MG tablet Take 10 mg by mouth as directed. Take one tablet up to 4 times weekly , Sunday Monday, Wednesday and Friday    . temazepam (RESTORIL) 15 MG capsule Take 15 mg by mouth at bedtime.     . ziprasidone (GEODON) 20 MG capsule Take 20 mg by  mouth at bedtime.      No current facility-administered medications for this encounter.     Allergies  Allergen Reactions  . Sulfa Antibiotics Rash    Childhood allergy  . Ciprocinonide [Fluocinolone] Rash  . Ciprofloxacin Rash    Social History   Social History  . Marital status: Divorced    Spouse name: N/A  . Number of children: N/A  . Years of education: N/A   Occupational History  . Not on file.   Social History Main Topics  . Smoking status: Former Research scientist (life sciences)  . Smokeless tobacco: Never Used     Comment: QUIT SMOKING IN 1998  . Alcohol use Yes     Comment: GLASS WINE W/ DINNER  . Drug use: No  . Sexual activity: Not on file   Other Topics Concern  . Not on file   Social History Narrative  . No narrative on file    Family History  Problem Relation Age of Onset  . Hypertension Mother   . Cancer Father        JAW BONE  . COPD Brother   . Atrial fibrillation Brother     ROS- All systems are reviewed and negative except as per the HPI above  Physical Exam: Vitals:   05/26/17 1059  BP: 110/62  Pulse: 61  Weight: 132 lb 12.8 oz (60.2 kg)  Height: 5\' 2"  (1.575 m)   Wt Readings from Last 3 Encounters:  05/26/17 132 lb 12.8 oz (60.2 kg)  04/26/17 130 lb 6.4 oz (59.1 kg)  04/04/17 134 lb 6.4 oz (61 kg)    Labs: Lab Results  Component Value Date   NA 142 04/11/2017   K 4.4 04/11/2017   CL 102 04/11/2017   CO2 23 04/11/2017   GLUCOSE 100 (H) 04/11/2017   BUN 14 04/11/2017   CREATININE 0.86 04/11/2017   CALCIUM 9.1 04/11/2017   MG 2.0 08/15/2014   Lab Results  Component Value Date   INR 1.05 07/31/2016   Lab Results  Component Value Date   CHOL 136 02/12/2017   HDL 46 02/12/2017   LDLCALC 67 02/12/2017   TRIG 114 02/12/2017     GEN- The patient is well appearing, alert and oriented x 3 today.   Head- normocephalic, atraumatic Eyes-  Sclera clear, conjunctiva pink Ears- hearing intact Oropharynx- clear Neck- supple, no JVP Lymph- no  cervical lymphadenopathy Lungs- Clear to ausculation bilaterally, normal work of breathing Heart- Regular rate and rhythm, no murmurs, rubs or  gallops, PMI not laterally displaced GI- soft, NT, ND, + BS Extremities- no clubbing, cyanosis, or edema MS- no significant deformity or atrophy Skin- no rash or lesion Psych- euthymic mood, full affect Neuro- strength and sensation are intact  EKG-NSR at 61 bpm, pr int 156 ms , qrs int 74 ms, qtc 440 ms ER records reviewed    Assessment and Plan: 1. Afib S/p ablation and is maintaining SR Off dilt 180 mg daily Continue atenolol 50 mg bid Continue eliquis for a chadsvasc score of at least 3  afib clinic as needed Dr. Curt Bears  9/12  Butch Penny C. Katha Kuehne, [facility] [address] [place], [place] [address] [phone]

## 2017-05-29 ENCOUNTER — Telehealth: Payer: Self-pay | Admitting: Cardiology

## 2017-05-29 NOTE — Telephone Encounter (Signed)
Walk In Pt Form-[employer] paperwork dropped off will give to [place] via her next day in office.

## 2017-06-03 ENCOUNTER — Other Ambulatory Visit: Payer: Self-pay

## 2017-06-03 MED ORDER — APIXABAN 5 MG PO TABS: 5.0000 mg | ORAL_TABLET | Freq: Two times a day (BID) | ORAL | 3 refills | Status: DC

## 2017-06-03 NOTE — Telephone Encounter (Addendum)
I have completed the provider part of the pts pt assistance application with [place] for Eliquis and Dr Marlou Porch has signed it. I have faxed application to [employer]. The pt is aware. Awaiting response.

## 2017-06-10 IMAGING — CT CT HEART MORPH/PULM VEIN W/ CM & W/O CA SCORE
2 of 10 series · 8 of 20 positions shown, 9 images · non-contrast
Comparison: Chest radiograph 85040

CLINICAL DATA: 76-year-old female with atrial fibrillation
scheduled for an ablation.

EXAM:
Cardiac CT/CTA
TECHNIQUE: The patient was scanned on a Siemens Somatom scanner.

[Series 10: corcta 0.6 i26f 2 40 - 80 % · axial · 0.36mm/px · z∈[+1175,+1267]mm · 4 of 3483 slices shown, 5 images (1 of 2)]
[im 697/3483  vessel]
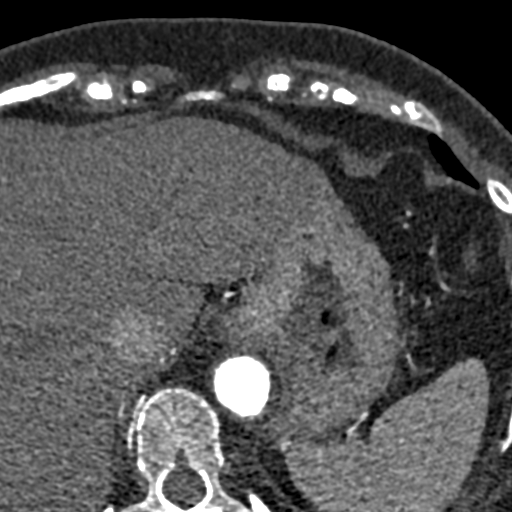
[im 697/3483  lung]
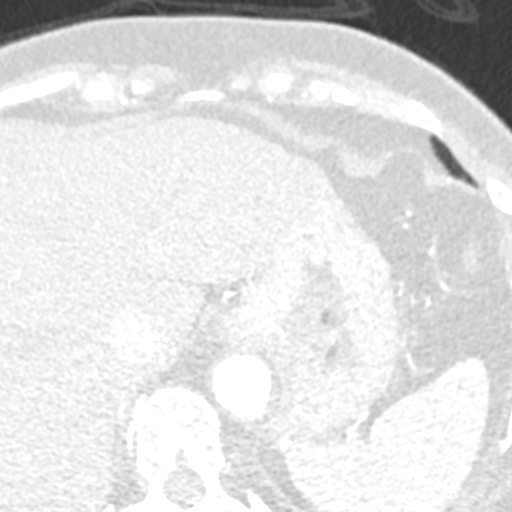
[im 1393/3483  vessel]
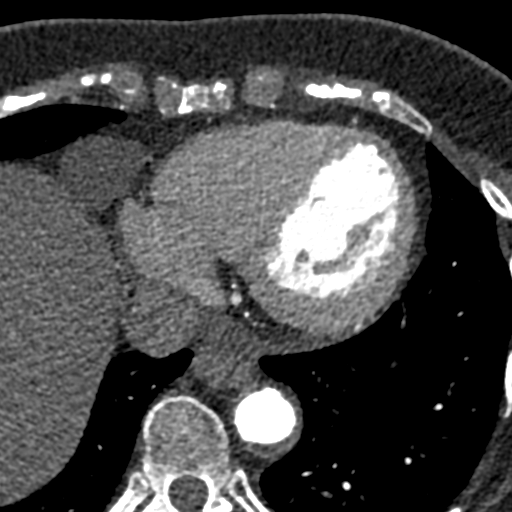
[im 2090/3483  vessel]
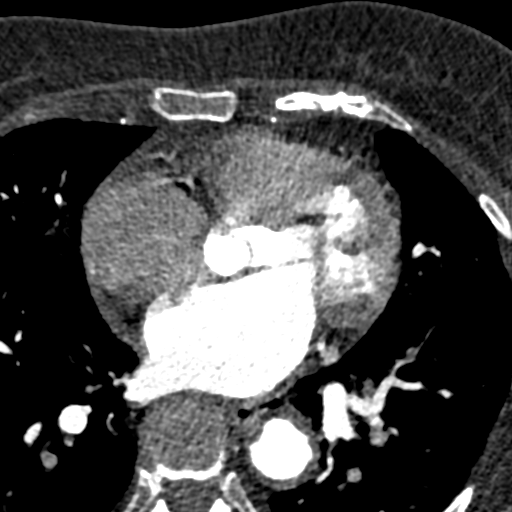
[im 2786/3483  vessel]
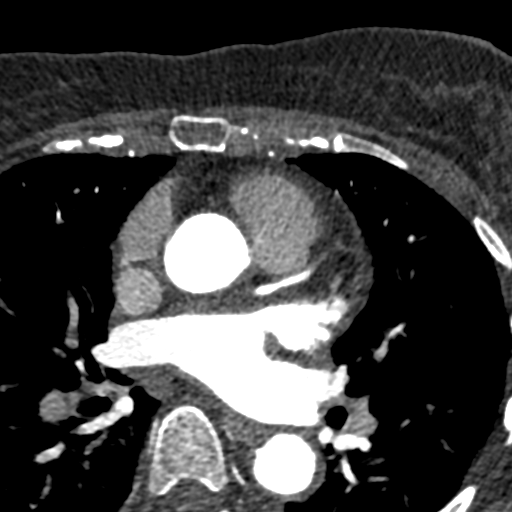

[Series 16: corcta 0.6 i26f 2 40 - 80 % · axial · 0.36mm/px · z∈[+1175,+1267]mm · 4 of 3483 slices shown (2 of 2)]
[im 697/3483  vessel]
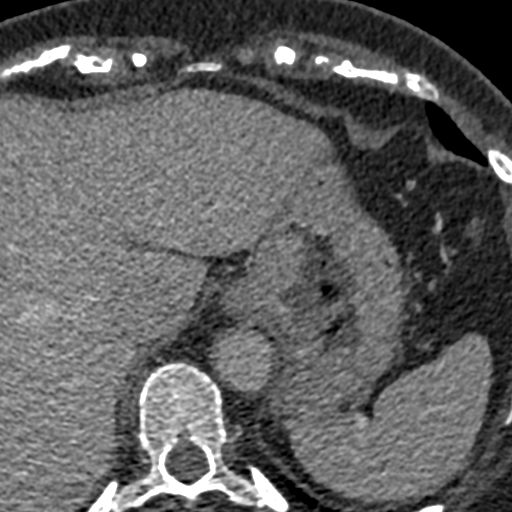
[im 1393/3483  vessel]
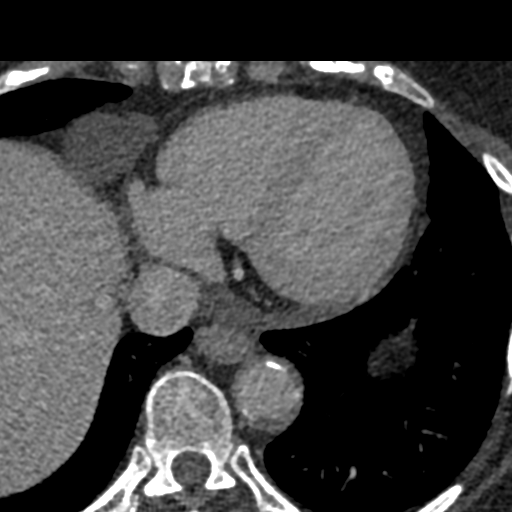
[im 2090/3483  vessel]
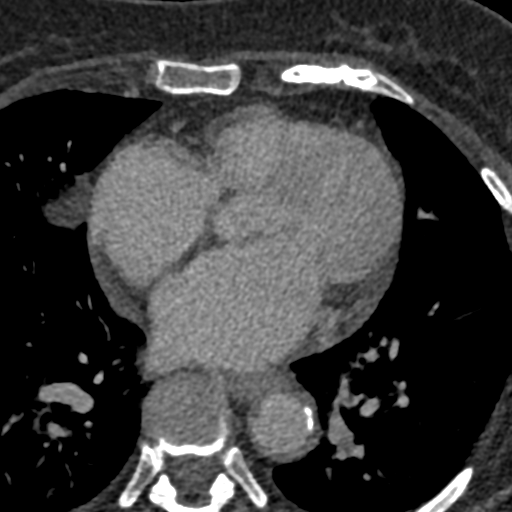
[im 2786/3483  vessel]
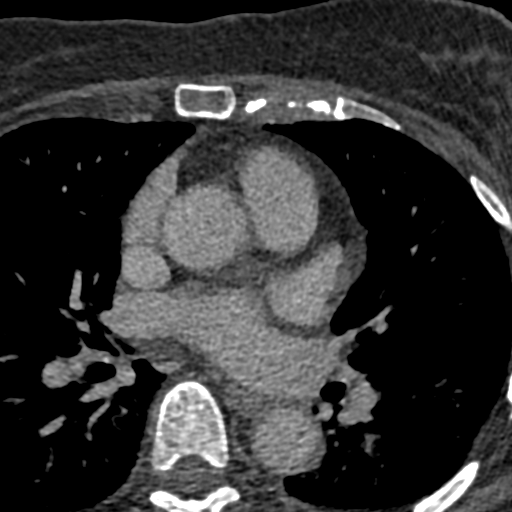

[8 of 20 positions shown; findings below may reference images not displayed]

FINDINGS: A 120 kV prospective scan was triggered in the descending thoracic
aorta at 111 HU's. Gantry rotation speed was 280 msecs and
collimation was .9 mm. No beta blockade and no NTG was given. The 3D
data set was reconstructed in 5% intervals of the 60-80 % of the R-R
cycle. Diastolic phases were analyzed on a dedicated work station
using MPR, MIP and VRT modes. The patient received 80 cc of
contrast.

There is normal pulmonary vein drainage into the left atrium (2 on
the right and 1 on the left) with ostial measurements as follows:

RUPV:  22 x 17 mm

RLPV:  24 x 15 mm

Left common pulmonary vein trunk:  33 x 22 mm

The left atrial appendage is large with chicken wing morphology with
one major lobe and ostial size 21 x 12 mm and length 33 mm. There is
no thrombus in the left atrial appendage.

The esophagus runs to the left from the left atrial midline and is
in the proximity to the left common pulmonary vein trunk.

Aorta:  Normal caliber.  No dissection or calcifications.

Aortic Valve:  Trileaflet.  No calcifications.

Coronary Arteries: Normal coronary origin. Left dominance. The study
was performed without use of NTG and insufficient for plaque
evaluation.
IMPRESSION: 1. There is normal pulmonary vein drainage into the left atrium.
There is no evidence for pulmonary vein stenosis.

2. The left atrial appendage is large with chicken wing morphology
with 1 major lobe and ostial size 21 x 12 mm and length 33 mm. There
is no thrombus in the left atrial appendage.

3. The esophagus runs to the left from the left atrial midline and
is in the proximity to the left common pulmonary vein trunk.

Doni Dejean

EXAM:
OVER-READ INTERPRETATION  CT CHEST

The following report is an over-read performed by radiologist Dr.
over-read does not include interpretation of cardiac or coronary
anatomy or pathology. The coronary CTA for cardiac ablation
interpretation by the cardiologist is attached.
FINDINGS: Limited view of the lung parenchyma demonstrates no suspicious
nodularity. Airways are normal.

Limited view of the mediastinum demonstrates no adenopathy.
Esophagus normal.

Limited view of the upper abdomen unremarkable.

Limited view of the skeleton and chest wall is unremarkable.
IMPRESSION: No significant extracardiac findings.

## 2017-06-11 NOTE — Telephone Encounter (Signed)
Received a fax stating that [facility] has received our request for pt assistance.

## 2017-06-12 DIAGNOSIS — F28 Other psychotic disorder not due to a substance or known physiological condition: Secondary | ICD-10-CM | POA: Diagnosis not present

## 2017-06-25 ENCOUNTER — Telehealth: Payer: Self-pay | Admitting: Cardiology

## 2017-06-25 NOTE — Telephone Encounter (Signed)
Spoke with patient and she explained to nurse that her HR went to 168 this morning but she had forgotten to take her medication this morning and thinks this is why her HR increased. Patient also admitted to nurse that she fills her pill box herself and that on 2 days last week, she took the wrong dose of atenolol taking 50 mg twice daily instead of 100 mg twice daily.   Suggested to patient that she should have her medications bubbled packed but she said that she currently uses [facility] in which they don't bubble pack. Nurse advised patient that the smaller, local owned pharmacies usually bubble pack and that if she was interested in having it done to call our office back and we could give her the names of the pharmacies. Patient verbalized understanding.

## 2017-06-25 NOTE — Telephone Encounter (Signed)
New Message     Patients states she had a tachycardia event this morning, her heart rate went up to 168 after 15 minutes it went back to normal , this is the first event she has had since the ablation,  She had dizziness but no other symptoms.

## 2017-06-27 NOTE — Telephone Encounter (Signed)
**Note De-Identified Kaitlyn Hoover Obfuscation** Approval received on Pt assistance for Eliquis from [employer] Isao Seltzer fax. Approval good from 06/26/17 until 11/17/17.

## 2017-06-28 DIAGNOSIS — R399 Unspecified symptoms and signs involving the genitourinary system: Secondary | ICD-10-CM | POA: Diagnosis not present

## 2017-07-04 DIAGNOSIS — R399 Unspecified symptoms and signs involving the genitourinary system: Secondary | ICD-10-CM | POA: Diagnosis not present

## 2017-07-04 NOTE — Telephone Encounter (Signed)
Thanks for update °Ovid Witman, MD ° °

## 2017-07-07 ENCOUNTER — Telehealth: Payer: Self-pay | Admitting: Cardiology

## 2017-07-07 NOTE — Telephone Encounter (Signed)
**Note De-Identified  Obfuscation** The pt is advised that we received a fax from on 8/10 from [employer] stating that she is approved for pr assistance with Eliquis. She states that she is aware as they mailed her an approval letter. I advised her to call [employer] and ask when she can expect to receive her Eliquis. She states that she has called [employer] and was on hold for 30 mins. She is advised to call them back and wait to talk with them and that I will leave her 1 bottle of Eliquis samples in our front office and that she can pick up at her convenience. She verbalized understanding, thanked me for my help and states that she will call [employer] back and wait to talk with someone concerning her shipment of Eliquis.

## 2017-07-07 NOTE — Telephone Encounter (Signed)
New message ° ° °Patient calling the office for samples of medication: ° ° °1.  What medication and dosage are you requesting samples for?apixaban (ELIQUIS) 5 MG TABS tablet ° °2.  Are you currently out of this medication? YES ° ° °

## 2017-07-07 NOTE — Telephone Encounter (Signed)
There is a note from you that the patient was approved for patient assistance. Unsure why she is asking for samples.

## 2017-07-09 ENCOUNTER — Encounter: Payer: Self-pay | Admitting: Cardiology

## 2017-07-29 DIAGNOSIS — K219 Gastro-esophageal reflux disease without esophagitis: Secondary | ICD-10-CM | POA: Diagnosis not present

## 2017-07-29 DIAGNOSIS — D123 Benign neoplasm of transverse colon: Secondary | ICD-10-CM | POA: Diagnosis not present

## 2017-07-29 DIAGNOSIS — K5901 Slow transit constipation: Secondary | ICD-10-CM | POA: Diagnosis not present

## 2017-07-29 NOTE — Progress Notes (Signed)
Electrophysiology Office Note   Date:  07/30/2017   ID:  Kaitlyn Hoover, DOB 1941-04-12, MRN [id number]  PCP:  [name], MD  Cardiologist:  Marlou Porch Primary Electrophysiologist:  Emilyann Banka Meredith Leeds, MD    Chief Complaint  Patient presents with  . Follow-up    PAF     History of Present Illness: Kaitlyn Hoover is a 76 y.o. female who is being seen today for the evaluation of SVT, atrial fibrillation at the request of [name], MD. Presenting today for electrophysiology evaluation. She had multiple episodes of ER visits due to atrial fibrillation. She was initially put on diltiazem 180 mg which has improved her symptoms. Her episodes of SVT have been converted with Valsalva. She had AF ablation 04/25/17 and is presenting for follow up.  Today, denies symptoms of palpitations, chest pain, shortness of breath, orthopnea, PND, lower extremity edema, claudication, dizziness, presyncope, syncope, bleeding, or neurologic sequela. The patient is tolerating medications without difficulties. She did have an episode of SVT. She says that she missed her dose of atenolol. The episode lasted approximately 10 minutes and was relieved with Valsalva. She has had no episodes of SVT when she has remembered her atenolol.    Past Medical History:  Diagnosis Date  . Anxiety   . Arthritis   . Atrial fibrillation with RVR ([place])   . Bipolar disorder ([place])   . Dysrhythmia   . GERD (gastroesophageal reflux disease)   . Hypertension   . Hypothyroidism   . Palpitations   . PAT (paroxysmal atrial tachycardia) ([place])   . Post-polio syndrome   . Sinus infection   . 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 ABDOMINAL HYSTERECTOMY    . ABLATION OF DYSRHYTHMIC FOCUS  04/25/2017  . ATRIAL FIBRILLATION ABLATION N/A 04/25/2017   Procedure: Atrial Fibrillation Ablation;  Surgeon: Constance Haw, MD;  Location: [facility];  Service: Cardiovascular;   Laterality: N/A;  . CATARACT EXTRACTION W/ INTRAOCULAR LENS  IMPLANT, BILATERAL    . CERVICAL LAMINECTOMY    . CESAREAN SECTION     X2   . LEGS      AGE 31-11   SEVERAL SURGERIES FOR POLIO   . THYROIDECTOMY       Current Outpatient Prescriptions  Medication Sig Dispense Refill  . acyclovir (ZOVIRAX) 400 MG tablet Take 400 mg by mouth daily.     Marland Kitchen alendronate (FOSAMAX) 70 MG tablet Take 70 mg by mouth every Thursday. Take with a full glass of water on an empty stomach.     Marland Kitchen apixaban (ELIQUIS) 5 MG TABS tablet Take 1 tablet (5 mg total) by mouth 2 (two) times daily. 180 tablet 3  . Ascorbic Acid (VITAMIN C) 1000 MG tablet Take 1,000 mg by mouth daily.    Marland Kitchen atenolol (TENORMIN) 50 MG tablet Take 1 tablet (50 mg total) by mouth 2 (two) times daily. 180 tablet 3  . Calcium Citrate-Vitamin D (CALCIUM CITRATE + D PO) Take 2 tablets by mouth every evening.    . Coenzyme Q10 (CO Q-10) 100 MG CAPS Take 100 mg by mouth daily.     Marland Kitchen D-Ribose (RIBOSE, D,) POWD 2 g by Does not apply route daily.    . fexofenadine (ALLEGRA) 180 MG tablet Take 180 mg by mouth daily.    . fluticasone (FLONASE) 50 MCG/ACT nasal spray Place 2 sprays into both nostrils daily.    Marland Kitchen  Ginkgo Biloba 120 MG TABS Take 240 mg by mouth daily.    Marland Kitchen iloperidone (FANAPT) 4 MG TABS tablet Take 1 mg by mouth daily. Quarters the tablet    . levothyroxine (SYNTHROID, LEVOTHROID) 100 MCG tablet Take 100 mcg by mouth daily.    . Melatonin 3 MG TABS Take 3 mg by mouth at bedtime.    . Multiple Vitamin (MULTIVITAMIN) capsule Take 1 capsule by mouth 2 (two) times daily.    . Omega-3 Fatty Acids (FISH OIL BURP-LESS) 1000 MG CAPS Take 3,000 Units by mouth 2 (two) times daily.    Marland Kitchen omeprazole (PRILOSEC) 20 MG capsule Take 20 mg by mouth every morning.    Marland Kitchen oxybutynin (DITROPAN-XL) 5 MG 24 hr tablet Take 5 mg by mouth daily.    . polyethylene glycol (MIRALAX / GLYCOLAX) packet Take 17 g by mouth daily.     . rosuvastatin (CRESTOR) 10 MG tablet  Take 10 mg by mouth as directed. Take one tablet up to 4 times weekly , Sunday Monday, Wednesday and Friday    . temazepam (RESTORIL) 15 MG capsule Take 15 mg by mouth at bedtime.     . ziprasidone (GEODON) 20 MG capsule Take 20 mg by mouth at bedtime.      No current facility-administered medications for this visit.     Allergies:   Sulfa antibiotics; Ciprocinonide [fluocinolone]; and Ciprofloxacin   Social History:  The patient  reports that she has quit smoking. She has never used smokeless tobacco. She reports that she drinks alcohol. She reports that she does not use drugs.   Family History:  The patient's family history includes Atrial fibrillation in her brother; COPD in her brother; Cancer in her father; Hypertension in her mother.    ROS:  Please see the history of present illness.   Otherwise, review of systems is positive for cough, difficulty  urinating.   All other systems are reviewed and negative.   PHYSICAL EXAM: VS:  BP 136/60   Pulse 65   Ht 5\' 2"  (1.575 m)   Wt 132 lb 12.8 oz (60.2 kg)   LMP  (LMP Unknown)   BMI 24.29 kg/m  , BMI Body mass index is 24.29 kg/m. GEN: Well nourished, well developed, in no acute distress  HEENT: normal  Neck: no JVD, carotid bruits, or masses Cardiac: RRR; no murmurs, rubs, or gallops,no edema  Respiratory:  clear to auscultation bilaterally, normal work of breathing GI: soft, nontender, nondistended, + BS MS: no deformity or atrophy  Skin: warm and dry Neuro:  Strength and sensation are intact Psych: euthymic mood, full affect  EKG:  EKG is ordered today. Personal review of the ekg ordered shows SR, rate 65    Recent Labs: 04/02/2017: ALT 15 04/11/2017: BUN 14; Creatinine, Ser 0.86; Hemoglobin 12.0; Platelets 306; Potassium 4.4; Sodium 142    Lipid Panel     Component Value Date/Time   CHOL 136 02/12/2017 1013   TRIG 114 02/12/2017 1013   HDL 46 02/12/2017 1013   CHOLHDL 3.0 02/12/2017 1013   CHOLHDL 3.5 10/22/2016  1039   VLDL 19 10/22/2016 1039   LDLCALC 67 02/12/2017 1013     Wt Readings from Last 3 Encounters:  07/30/17 132 lb 12.8 oz (60.2 kg)  05/26/17 132 lb 12.8 oz (60.2 kg)  04/26/17 130 lb 6.4 oz (59.1 kg)      Other studies Reviewed: Additional studies/ records that were reviewed today include: TTE 2015  Review of the above  records today demonstrates:  - Left ventricle: The cavity size was normal. Wall thickness was normal. Systolic function was normal. The estimated ejection fraction was in the range of 60% to 65%. Wall motion was normal; there were no regional wall motion abnormalities. - Left atrium: The atrium was mildly dilated. - Atrial septum: No defect or patent foramen ovale was identified. - Pulmonary arteries: PA peak pressure: 32 mm Hg (S).   ASSESSMENT AND PLAN:  1.  Paroxysmal atrial fibrillation: On atenolol, diltiazem, Eliquis. Had AF ablation 04/25/17. Has remained in sinus rhythm. No medication changes at this time.  This patients CHA2DS2-VASc Score and unadjusted Ischemic Stroke Rate (% per year) is equal to 3.2 % stroke rate/year from a score of 3  Above score calculated as 1 point each if present [CHF, HTN, DM, Vascular=MI/PAD/Aortic Plaque, Age if 65-74, or Female] Above score calculated as 2 points each if present [Age > 75, or Stroke/TIA/TE]   2. Hypertension: Well-controlled. No medication changes   3. Carotid artery plaque: Continue statin  4. SVT: No episodes when she has been compliant with her atenolol. Does break with Valsalva. No changes at this time.  Current medicines are reviewed at length with the patient today.   The patient does not have concerns regarding her medicines.  The following changes were made today:    Labs/ tests ordered today include:  No orders of the defined types were placed in this encounter.    Disposition:   FU with Marry Kusch 3 months  Signed, Reuven Braver Meredith Leeds, MD  07/30/2017 10:47 AM     [facility] [address] [place] [address] [phone] (office) [phone] (fax)

## 2017-07-30 ENCOUNTER — Encounter: Payer: Self-pay | Admitting: Cardiology

## 2017-07-30 ENCOUNTER — Ambulatory Visit (INDEPENDENT_AMBULATORY_CARE_PROVIDER_SITE_OTHER): Payer: PPO | Admitting: Cardiology

## 2017-07-30 VITALS — BP 136/60 | HR 65 | Ht 62.0 in | Wt 132.8 lb

## 2017-07-30 DIAGNOSIS — I10 Essential (primary) hypertension: Secondary | ICD-10-CM

## 2017-07-30 DIAGNOSIS — I480 Paroxysmal atrial fibrillation: Secondary | ICD-10-CM

## 2017-07-30 DIAGNOSIS — I471 Supraventricular tachycardia: Secondary | ICD-10-CM

## 2017-07-30 NOTE — Patient Instructions (Signed)
Medication Instructions:    Your physician recommends that you continue on your current medications as directed. Please refer to the Current Medication list given to you today.  - If you need a refill on your cardiac medications before your next appointment, please call your pharmacy.   Labwork:  None ordered  Testing/Procedures:  None ordered  Follow-Up:  Your physician recommends that you schedule a follow-up appointment in: 3 months with Dr. Camnitz.  Thank you for choosing [facility]!!   Vondra Aldredge, RN [phone]         

## 2017-08-15 DIAGNOSIS — Z23 Encounter for immunization: Secondary | ICD-10-CM | POA: Diagnosis not present

## 2017-09-03 ENCOUNTER — Other Ambulatory Visit: Payer: Self-pay | Admitting: Cardiology

## 2017-09-03 DIAGNOSIS — I6523 Occlusion and stenosis of bilateral carotid arteries: Secondary | ICD-10-CM

## 2017-09-09 ENCOUNTER — Ambulatory Visit (INDEPENDENT_AMBULATORY_CARE_PROVIDER_SITE_OTHER): Payer: PPO | Admitting: Neurology

## 2017-09-09 ENCOUNTER — Encounter: Payer: Self-pay | Admitting: Neurology

## 2017-09-09 VITALS — BP 127/61 | HR 64 | Resp 16 | Ht 62.0 in | Wt 134.5 lb

## 2017-09-09 DIAGNOSIS — G14 Postpolio syndrome: Secondary | ICD-10-CM | POA: Diagnosis not present

## 2017-09-09 DIAGNOSIS — M62569 Muscle wasting and atrophy, not elsewhere classified, unspecified lower leg: Secondary | ICD-10-CM

## 2017-09-09 DIAGNOSIS — R531 Weakness: Secondary | ICD-10-CM

## 2017-09-09 DIAGNOSIS — M48062 Spinal stenosis, lumbar region with neurogenic claudication: Secondary | ICD-10-CM

## 2017-09-09 MED ORDER — TRAMADOL HCL 50 MG PO TABS: 50.0000 mg | ORAL_TABLET | Freq: Three times a day (TID) | ORAL | 1 refills | Status: DC | PRN

## 2017-09-09 NOTE — Progress Notes (Signed)
[facility]  PATIENT: Kaitlyn Hoover DOB: Jun 10, 1941  REFERRING DOCTOR OR PCP:  [name], MD SOURCE: patient, records form Dr. Justin Mend, records in EMR  _________________________________   HISTORICAL  CHIEF COMPLAINT:  Chief Complaint  Patient presents with  . Post Polio Syndrome    Sts. pain in legs and gait disturbance continue to be improved since spinal surger in Sept. 2017.  She believes weakness bilat legs is worse/fim  . Spinal Stenosis  . Gait Disturbance  . Extremity Weakness    HISTORY OF PRESENT ILLNESS:  Kaitlyn Hoover is a 76 year old woman who had progressive weakness in her legs.   Update 09/09/2017:   She had surgery September 2017 by Dr. Christella Noa and pain improved after her surgery.  Gait may have improved mildly but she continues to feel gait is poor.   She has post-polio syndrome diagnosed by history and an EMG at a post-polio clinic.   She notes more slow weakness since earlier this year.   She is able to squat and rise.     She is able to walk a block before she stops due to weakness.   She denies any numbness in her legs though does have some in hands (has Dx of CTS).   Bladder function is fine.    She has been on Crestor since late 2017 but she has not noted any cramps.   She has an AFO brace (> 9 years old)for the right foot but does not wear it.    From 09/05/2016: She was diagnosed with post polio syndrome in 1998.    MRI of the lumbar spine showed severe spinal stenosis.    Her legs felt weaker and more painful as she walked more.     Pain was mildly better when she leans forward as she walks.        Dr. Christella Noa at [facility] did a fusion surgey and she feels the pain and gait are both better.   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  She had severe L4L5 stenosis with AP diameter only 5 mm and apparent left S1 nerve root compression on MRI.    Post-op images were reviewed and  alignment looks good.    She feels strength is better though still has right > left leg weakness and has atrophy more on the right (right side has always been worse)   Polio History:   She had polio when she was 4 and it affected her legs, much more so on the right. Over the next year or 2 she recovered and was able to be very active as a child. Starting around age 34, she began to notice that she was getting weaker again in the legs. Initially the right leg was more involved but both legs seem to be involved now.   In 1998, she went to a post polio center in [place] and had a nerve conduction/EMG test performed confirming the history of polio and she was told that it was consistent with post polio syndrome.  Those results are not available at this time.  Spinal stenosis: MRI of the lumbar spine shows severe spinal stenosis at L4-L5 with an AP diameter of only about 5. Mm.   There is also left greater than right lateral recess stenosis there that could be causing L5 nerve root compression. There is milder spinal stenosis at L5-S1 that could lead to left S1 nerve root compression.  She does report symptoms of leg pain as she walks that are generally better when she is leaning forward generally worse when she is standing up straight. She finds her legs become weaker and weaker as she walks.  REVIEW OF SYSTEMS: Constitutional: No fevers, chills, sweats, or change in appetite Eyes: No visual changes, double vision, eye pain Ear, nose and throat: No hearing loss, ear pain, nasal congestion, sore throat Cardiovascular: No chest pain, palpitations Respiratory: No shortness of breath at rest or with exertion.   No wheezes GastrointestinaI: No nausea, vomiting, diarrhea, abdominal pain, fecal incontinence Genitourinary: No dysuria, urinary retention or frequency.  No nocturia. Musculoskeletal: No neck pain, back pain Integumentary: No rash, pruritus, skin lesions Neurological: as above Psychiatric: No  depression at this time.  No anxiety Endocrine: No palpitations, diaphoresis, change in appetite, change in weigh or increased thirst Hematologic/Lymphatic: No anemia, purpura, petechiae. Allergic/Immunologic: No itchy/runny eyes, nasal congestion, recent allergic reactions, rashes  ALLERGIES: Allergies  Allergen Reactions  . Sulfa Antibiotics Rash    Childhood allergy  . Ciprocinonide [Fluocinolone] Rash  . Ciprofloxacin Rash    HOME MEDICATIONS:  Current Outpatient Prescriptions:  .  acyclovir (ZOVIRAX) 400 MG tablet, Take 400 mg by mouth daily. , Disp: , Rfl:  .  alendronate (FOSAMAX) 70 MG tablet, Take 70 mg by mouth every Thursday. Take with a full glass of water on an empty stomach. , Disp: , Rfl:  .  apixaban (ELIQUIS) 5 MG TABS tablet, Take 1 tablet (5 mg total) by mouth 2 (two) times daily., Disp: 180 tablet, Rfl: 3 .  Ascorbic Acid (VITAMIN C) 1000 MG tablet, Take 1,000 mg by mouth daily., Disp: , Rfl:  .  atenolol (TENORMIN) 50 MG tablet, Take 1 tablet (50 mg total) by mouth 2 (two) times daily., Disp: 180 tablet, Rfl: 3 .  Calcium Citrate-Vitamin D (CALCIUM CITRATE + D PO), Take 2 tablets by mouth every evening., Disp: , Rfl:  .  Coenzyme Q10 (CO Q-10) 100 MG CAPS, Take 100 mg by mouth daily. , Disp: , Rfl:  .  D-Ribose (RIBOSE, D,) POWD, 2 g by Does not apply route daily., Disp: , Rfl:  .  fexofenadine (ALLEGRA) 180 MG tablet, Take 180 mg by mouth daily., Disp: , Rfl:  .  fluticasone (FLONASE) 50 MCG/ACT nasal spray, Place 2 sprays into both nostrils daily., Disp: , Rfl:  .  Ginkgo Biloba 120 MG TABS, Take 240 mg by mouth daily., Disp: , Rfl:  .  iloperidone (FANAPT) 4 MG TABS tablet, Take 1 mg by mouth daily. Quarters the tablet, Disp: , Rfl:  .  levothyroxine (SYNTHROID, LEVOTHROID) 100 MCG tablet, Take 100 mcg by mouth daily., Disp: , Rfl:  .  Melatonin 3 MG TABS, Take 3 mg by mouth at bedtime., Disp: , Rfl:  .  Multiple Vitamin (MULTIVITAMIN) capsule, Take 1 capsule  by mouth 2 (two) times daily., Disp: , Rfl:  .  Omega-3 Fatty Acids (FISH OIL BURP-LESS) 1000 MG CAPS, Take 3,000 Units by mouth 2 (two) times daily., Disp: , Rfl:  .  omeprazole (PRILOSEC) 20 MG capsule, Take 20 mg by mouth every morning., Disp: , Rfl:  .  oxybutynin (DITROPAN-XL) 5 MG 24 hr tablet, Take 5 mg by mouth daily., Disp: , Rfl:  .  polyethylene glycol (MIRALAX / GLYCOLAX) packet, Take 17 g by mouth daily. , Disp: , Rfl:  .  rosuvastatin (CRESTOR) 10 MG tablet, Take 10 mg by mouth as directed. Take one tablet up  to 4 times weekly , Sunday Monday, Wednesday and Friday, Disp: , Rfl:  .  temazepam (RESTORIL) 15 MG capsule, Take 15 mg by mouth at bedtime. , Disp: , Rfl:  .  ziprasidone (GEODON) 20 MG capsule, Take 20 mg by mouth at bedtime. , Disp: , Rfl:  .  amoxicillin-clavulanate (AUGMENTIN) 875-125 MG tablet, , Disp: , Rfl:  .  traMADol (ULTRAM) 50 MG tablet, Take 1 tablet (50 mg total) by mouth 3 (three) times daily as needed., Disp: 90 tablet, Rfl: 1  PAST MEDICAL HISTORY: Past Medical History:  Diagnosis Date  . Anxiety   . Arthritis   . Atrial fibrillation with RVR ([place])   . Bipolar disorder ([place])   . Dysrhythmia   . GERD (gastroesophageal reflux disease)   . Hypertension   . Hypothyroidism   . Palpitations   . PAT (paroxysmal atrial tachycardia) ([place])   . Post-polio syndrome   . Sinus infection   . 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 ABDOMINAL HYSTERECTOMY    . ABLATION OF DYSRHYTHMIC FOCUS  04/25/2017  . ATRIAL FIBRILLATION ABLATION N/A 04/25/2017   Procedure: Atrial Fibrillation Ablation;  Surgeon: Constance Haw, MD;  Location: [facility];  Service: Cardiovascular;  Laterality: N/A;  . CATARACT EXTRACTION W/ INTRAOCULAR LENS  IMPLANT, BILATERAL    . CERVICAL LAMINECTOMY    . CESAREAN SECTION     X2   . LEGS      AGE 80-11   SEVERAL SURGERIES FOR POLIO   . THYROIDECTOMY       FAMILY HISTORY: Family History  Problem Relation Age of Onset  . Hypertension Mother   . Cancer Father        JAW BONE  . COPD Brother   . Atrial fibrillation Brother     SOCIAL HISTORY:  Social History   Social History  . Marital status: Divorced    Spouse name: N/A  . Number of children: N/A  . Years of education: N/A   Occupational History  . Not on file.   Social History Main Topics  . Smoking status: Former Research scientist (life sciences)  . Smokeless tobacco: Never Used     Comment: QUIT SMOKING IN 1998  . Alcohol use Yes     Comment: GLASS WINE W/ DINNER  . Drug use: No  . Sexual activity: Not on file   Other Topics Concern  . Not on file   Social History Narrative  . No narrative on file     PHYSICAL EXAM  Vitals:   09/09/17 1424  BP: 127/61  Pulse: 64  Resp: 16  Weight: 134 lb 8 oz (61 kg)  Height: 5\' 2"  (1.575 m)    Body mass index is 24.6 kg/m.   General: The patient is well-developed and well-nourished and in no acute distress   Extremities:   The right leg is mildly shorter than the left leg.  Musculoskeletal:  Back is nontender  Neurologic Exam  Mental status: The patient is alert and oriented x 3 at the time of the examination. The patient has apparent normal recent and remote memory, with an apparently normal attention span and concentration ability.   Speech is normal.  Cranial nerves: Extraocular movements are full.  There is good facial sensation to soft touch bilaterally.Facial strength is normal.  Trapezius and sternocleidomastoid strength is normal. No dysarthria is noted.  The tongue is  midline, and the patient has symmetric elevation of the soft palate. No obvious hearing deficits are noted.  Motor:  There is reduced muscle bulk in the right leg. Muscle tone is fairly symmetric.Marland Kitchen Strength is  5 / 5 in both arms and left leg.   Strength is 4+-5/5 in iliopsoas and quads, 4/5 hamstrings, 3/5 tib ant,  1/5 EHL , 2/5 in the EDB..   Sensory: Sensory  testing is intact to touch and vibration  Coordination: Cerebellar testing reveals good finger-nose-finger and heel-to-shin bilaterally.  Gait and station: Station is normal.   Gait has right foot drop. Tandem gait is wide. Romberg is negative.   Reflexes: Deep tendon reflexes are reduced in right leg compared to left.   Plantar responses are flexor.    DIAGNOSTIC DATA (LABS, IMAGING, TESTING) - I reviewed patient records, labs, notes, testing and imaging myself where available.  Lab Results  Component Value Date   WBC 8.4 04/11/2017   HGB 12.0 04/11/2017   HCT 36.8 04/11/2017   MCV 90 04/11/2017   PLT 306 04/11/2017      Component Value Date/Time   NA 142 04/11/2017 0911   K 4.4 04/11/2017 0911   CL 102 04/11/2017 0911   CO2 23 04/11/2017 0911   GLUCOSE 100 (H) 04/11/2017 0911   GLUCOSE 128 (H) 04/02/2017 0946   BUN 14 04/11/2017 0911   CREATININE 0.86 04/11/2017 0911   CALCIUM 9.1 04/11/2017 0911   PROT 7.9 04/02/2017 0946   PROT 7.2 02/12/2017 1013   ALBUMIN 3.2 (L) 04/02/2017 0946   ALBUMIN 4.0 02/12/2017 1013   AST 31 04/02/2017 0946   ALT 15 04/02/2017 0946   ALKPHOS 81 04/02/2017 0946   BILITOT 0.5 04/02/2017 0946   BILITOT 0.4 02/12/2017 1013   GFRNONAA 66 04/11/2017 0911   GFRAA 76 04/11/2017 0911   Lab Results  Component Value Date   CHOL 136 02/12/2017   HDL 46 02/12/2017   LDLCALC 67 02/12/2017   TRIG 114 02/12/2017   CHOLHDL 3.0 02/12/2017   No results found for: HGBA1C No results found for: VITAMINB12 Lab Results  Component Value Date   TSH 0.748 04/18/2014       ASSESSMENT AND PLAN  Post-polio syndrome  Progressive focal motor weakness  Atrophy of muscle of lower leg, unspecified laterality  Spinal stenosis of lumbar region with neurogenic claudication  1.   She will continue to try to be active and exercises as tolerated.  2.   Renewed tramadol for pain.   3.   RTC prn new or worsening symptoms  Tine Mabee A. Felecia Shelling, MD, PhD  32/67/1245, 8:09 PM Certified in Neurology, Clinical Neurophysiology, Sleep Medicine, Pain Medicine and Neuroimaging  [facility] [address] [place], [place] [address] [phone]

## 2017-09-17 ENCOUNTER — Encounter: Payer: Self-pay | Admitting: Cardiology

## 2017-09-17 ENCOUNTER — Ambulatory Visit (INDEPENDENT_AMBULATORY_CARE_PROVIDER_SITE_OTHER): Payer: PPO | Admitting: Cardiology

## 2017-09-17 VITALS — BP 150/70 | HR 58 | Ht 62.0 in | Wt 135.6 lb

## 2017-09-17 DIAGNOSIS — E7800 Pure hypercholesterolemia, unspecified: Secondary | ICD-10-CM | POA: Diagnosis not present

## 2017-09-17 DIAGNOSIS — I480 Paroxysmal atrial fibrillation: Secondary | ICD-10-CM | POA: Diagnosis not present

## 2017-09-17 DIAGNOSIS — I471 Supraventricular tachycardia: Secondary | ICD-10-CM | POA: Diagnosis not present

## 2017-09-17 DIAGNOSIS — Z789 Other specified health status: Secondary | ICD-10-CM

## 2017-09-17 DIAGNOSIS — I6523 Occlusion and stenosis of bilateral carotid arteries: Secondary | ICD-10-CM

## 2017-09-17 DIAGNOSIS — I10 Essential (primary) hypertension: Secondary | ICD-10-CM

## 2017-09-17 MED ORDER — LOSARTAN POTASSIUM 50 MG PO TABS: 50.0000 mg | ORAL_TABLET | Freq: Every day | ORAL | 3 refills | Status: DC

## 2017-09-17 NOTE — Patient Instructions (Signed)
Medication Instructions:  Please start Losartan 50 mg a day. Continue all other medications as listed.  Follow-Up: Follow up in 6 months with Dr. Marlou Porch.  You will receive a letter in the mail 2 months before you are due.  Please call us when you receive this letter to schedule your follow up appointment.  If you need a refill on your cardiac medications before your next appointment, please call your pharmacy.  Thank you for choosing [facility]!!

## 2017-09-17 NOTE — Progress Notes (Addendum)
Cardiology Office Note:    Date:  09/17/2017   ID:  [name], DOB [birth year], MRN [id number]  PCP:  [name], MD  Cardiologist:  Candee Furbish, MD    Referring MD: [name], MD     History of Present Illness:    Kaitlyn Hoover is a 76 y.o. female "Kaitlyn Hoover" with PSVT, atrial fibrillation, recently saw Dr. Curt Bears on 07/30/17.  She reported an episode of 10 minutes of SVT after missing a dose of atenolol.  Relieved with Valsalva.  She has had no further episodes of SVT when she remembered her atenolol according to Dr. Macky Lower note. Had 3 times since ablation.   She had prior atrial fibrillation ablation on 04/25/17.  She has remained in sinus rhythm.  Continuing with atenolol.  Trouble in the past tolerating statins.  We had her at one point on Crestor 4 times a week 70.  Assistance from lipid clinic.  No syncope. No CP, no SOB.   Past Medical History:  Diagnosis Date  . Anxiety   . Arthritis   . Atrial fibrillation with RVR (Kaitlyn Hoover)   . Bipolar disorder ([name])   . Dysrhythmia   . GERD (gastroesophageal reflux disease)   . Hypertension   . Hypothyroidism   . Palpitations   . PAT (paroxysmal atrial tachycardia) ([place])   . Post-polio syndrome   . Sinus infection   . 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 ABDOMINAL HYSTERECTOMY    . ABLATION OF DYSRHYTHMIC FOCUS  04/25/2017  . ATRIAL FIBRILLATION ABLATION N/A 04/25/2017   Procedure: Atrial Fibrillation Ablation;  Surgeon: Constance Haw, MD;  Location: [facility];  Service: Cardiovascular;  Laterality: N/A;  . CATARACT EXTRACTION W/ INTRAOCULAR LENS  IMPLANT, BILATERAL    . CERVICAL LAMINECTOMY    . CESAREAN SECTION     X2   . LEGS      AGE 29-11   SEVERAL SURGERIES FOR POLIO   . THYROIDECTOMY      Current Medications: Current Meds  Medication Sig  . acyclovir (ZOVIRAX) 400 MG tablet Take 400 mg by mouth daily.   Marland Kitchen alendronate (FOSAMAX) 70 MG tablet  Take 70 mg by mouth every Thursday. Take with a full glass of water on an empty stomach.   Marland Kitchen apixaban (ELIQUIS) 5 MG TABS tablet Take 1 tablet (5 mg total) by mouth 2 (two) times daily.  . Ascorbic Acid (VITAMIN C) 1000 MG tablet Take 1,000 mg by mouth daily.  Marland Kitchen atenolol (TENORMIN) 50 MG tablet Take 1 tablet (50 mg total) by mouth 2 (two) times daily.  . Calcium Citrate-Vitamin D (CALCIUM CITRATE + D PO) Take 2 tablets by mouth every evening.  . Coenzyme Q10 (CO Q-10) 100 MG CAPS Take 100 mg by mouth daily.   Marland Kitchen D-Ribose (RIBOSE, D,) POWD 2 g by Does not apply route daily.  . fexofenadine (ALLEGRA) 180 MG tablet Take 180 mg by mouth daily.  . fluticasone (FLONASE) 50 MCG/ACT nasal spray Place 2 sprays into both nostrils daily.  . Ginkgo Biloba 120 MG TABS Take 240 mg by mouth daily.  Marland Kitchen iloperidone (FANAPT) 4 MG TABS tablet Take 1 mg by mouth daily. Quarters the tablet  . levothyroxine (SYNTHROID, LEVOTHROID) 100 MCG tablet Take 100 mcg by mouth daily.  . Melatonin 3 MG TABS Take 3 mg by mouth at bedtime.  . Multiple Vitamin (MULTIVITAMIN) capsule Take 1  capsule by mouth 2 (two) times daily.  . Omega-3 Fatty Acids (FISH OIL BURP-LESS) 1000 MG CAPS Take 3,000 Units by mouth 2 (two) times daily.  Marland Kitchen omeprazole (PRILOSEC) 20 MG capsule Take 20 mg by mouth every morning.  Marland Kitchen oxybutynin (DITROPAN-XL) 5 MG 24 hr tablet Take 5 mg by mouth daily.  . polyethylene glycol (MIRALAX / GLYCOLAX) packet Take 17 g by mouth daily.   . rosuvastatin (CRESTOR) 10 MG tablet Take 10 mg by mouth as directed. Take one tablet up to 4 times weekly , Sunday Monday, Wednesday and Friday  . temazepam (RESTORIL) 15 MG capsule Take 15 mg by mouth at bedtime.   . traMADol (ULTRAM) 50 MG tablet Take 1 tablet (50 mg total) by mouth 3 (three) times daily as needed.  . ziprasidone (GEODON) 20 MG capsule Take 20 mg by mouth at bedtime.      Allergies:   Sulfa antibiotics; Ciprocinonide [fluocinolone]; and Ciprofloxacin   Social  History   Social History  . Marital status: Divorced    Spouse name: N/A  . Number of children: N/A  . Years of education: N/A   Social History Main Topics  . Smoking status: Former Research scientist (life sciences)  . Smokeless tobacco: Never Used     Comment: QUIT SMOKING IN 1998  . Alcohol use Yes     Comment: GLASS WINE W/ DINNER  . Drug use: No  . Sexual activity: Not Asked   Other Topics Concern  . None   Social History Narrative  . None     Family History: The patient's family history includes Atrial fibrillation in her brother; COPD in her brother; Cancer in her father; Hypertension in her mother. ROS:   Please see the history of present illness.     All other systems reviewed and are negative.  EKGs/Labs/Other Studies Reviewed:    The following studies were reviewed today:  ECHO: 04/11/17 - Normal LV size with EF 60-65%. Moderate diastolic dysfunction.   Normal RV size and systolic function. No significant valvular   abnormalities.  EKG:  EKG is not ordered today.    Recent Labs: 04/02/2017: ALT 15 04/11/2017: BUN 14; Creatinine, Ser 0.86; Hemoglobin 12.0; Platelets 306; Potassium 4.4; Sodium 142  Recent Lipid Panel    Component Value Date/Time   CHOL 136 02/12/2017 1013   TRIG 114 02/12/2017 1013   HDL 46 02/12/2017 1013   CHOLHDL 3.0 02/12/2017 1013   CHOLHDL 3.5 10/22/2016 1039   VLDL 19 10/22/2016 1039   LDLCALC 67 02/12/2017 1013    Physical Exam:    VS:  BP (!) 150/70   Pulse (!) 58   Ht 5\' 2"  (1.575 m)   Wt 135 lb 9.6 oz (61.5 kg)   LMP  (LMP Unknown)   SpO2 99%   BMI 24.80 kg/m     Wt Readings from Last 3 Encounters:  09/17/17 135 lb 9.6 oz (61.5 kg)  09/09/17 134 lb 8 oz (61 kg)  07/30/17 132 lb 12.8 oz (60.2 kg)     GEN:  Well nourished, well developed in no acute distress HEENT: Normal NECK: No JVD; No carotid bruits LYMPHATICS: No lymphadenopathy CARDIAC: RRR, no murmurs, rubs, gallops RESPIRATORY:  Clear to auscultation without rales, wheezing or  rhonchi  ABDOMEN: Soft, non-tender, non-distended MUSCULOSKELETAL:  No edema; No deformity  SKIN: Warm and dry NEUROLOGIC:  Alert and oriented x 3 PSYCHIATRIC:  Normal affect   ASSESSMENT:    1. Paroxysmal atrial fibrillation (HCC)   2.  Essential hypertension   3. Statin intolerance   4. Pure hypercholesterolemia   5. SVT (supraventricular tachycardia) ([place])   6. Bilateral carotid artery stenosis    PLAN:    In order of problems listed above:  Paroxysmal atrial fibrillation -Currently anticoagulated with Eliquis.  Atrial fibrillation ablation 04/25/17.  Sinus rhythm. -Continue with atenolol, diltiazem, Eliquis. -Score is 3.  Essential hypertension -Currently elevated, we will add losartan 50.  Carotid artery plaque -Continue with statin therapy. Moderate stenosis B.  SVT -Continue with atenolol.  Valsalva usually breaks the tachycardia.  She has had only 3 episodes of this post atrial fibrillation ablation.  No episodes after continuing atenolol.  Statin intolerance/hyperlipidemia -She is back to taking Crestor 4 days a week.  She held it for 3 weeks because of leg pain but her leg pain did not change.  LDL 67.   Medication Adjustments/Labs and Tests Ordered: Current medicines are reviewed at length with the patient today.  Concerns regarding medicines are outlined above.  No orders of the defined types were placed in this encounter.  Meds ordered this encounter  Medications  . losartan (COZAAR) 50 MG tablet    Sig: Take 1 tablet (50 mg total) by mouth daily.    Dispense:  90 tablet    Refill:  3    Signed, Candee Furbish, MD  09/17/2017 2:45 PM    [facility]

## 2017-09-19 ENCOUNTER — Telehealth: Payer: Self-pay | Admitting: Cardiology

## 2017-09-19 NOTE — Telephone Encounter (Signed)
Spoke with pt who is reporting she took her first dose of Losartan 50 mg yesterday about 12N.  She checked her BP about 7 pm and it was 172/80.  This am she took her medication about 10:30 am, checked her BP around 12 N and it was 170/70.  She is using a wrist bp monitor and states it's not been that long since she changed the battery in it.  She feels as though it is generally accurate.  Advised to continue to monitor BP at least one hr after taking her medications.  She is going to obtain a regular arm cuff bp monitor and start using it to check her BP.  She will c/b next week if BP remains elevated instead of decreasing since starting new med.

## 2017-09-19 NOTE — Telephone Encounter (Signed)
Mrs. Kaitlyn Hoover is calling because her bp on last night was 72/80 and she took her first dose of the new bp medication about noon , and the morning her bp is 170/70 and she took another pill about 10:30am . States that her blood pressure has never been that high . Pleased call

## 2017-09-23 DIAGNOSIS — J0100 Acute maxillary sinusitis, unspecified: Secondary | ICD-10-CM | POA: Diagnosis not present

## 2017-09-24 ENCOUNTER — Other Ambulatory Visit: Payer: Self-pay

## 2017-09-24 ENCOUNTER — Emergency Department (HOSPITAL_COMMUNITY)
Admission: EM | Admit: 2017-09-24 | Discharge: 2017-09-24 | Disposition: A | Discharge status: Home / Self Care | Payer: PPO | Attending: Emergency Medicine | Admitting: Emergency Medicine

## 2017-09-24 ENCOUNTER — Ambulatory Visit (HOSPITAL_BASED_OUTPATIENT_CLINIC_OR_DEPARTMENT_OTHER)
Admission: RE | Admit: 2017-09-24 | Discharge: 2017-09-24 | Disposition: A | Discharge status: Home / Self Care | Payer: PPO | Source: Ambulatory Visit | Attending: Cardiology | Admitting: Cardiology

## 2017-09-24 ENCOUNTER — Encounter (HOSPITAL_COMMUNITY): Payer: Self-pay | Admitting: Emergency Medicine

## 2017-09-24 DIAGNOSIS — I471 Supraventricular tachycardia: Secondary | ICD-10-CM | POA: Diagnosis not present

## 2017-09-24 DIAGNOSIS — R000 Tachycardia, unspecified: Secondary | ICD-10-CM | POA: Diagnosis present

## 2017-09-24 DIAGNOSIS — I4891 Unspecified atrial fibrillation: Secondary | ICD-10-CM | POA: Diagnosis not present

## 2017-09-24 DIAGNOSIS — I10 Essential (primary) hypertension: Secondary | ICD-10-CM | POA: Insufficient documentation

## 2017-09-24 DIAGNOSIS — I6523 Occlusion and stenosis of bilateral carotid arteries: Secondary | ICD-10-CM | POA: Diagnosis not present

## 2017-09-24 LAB — CBC
HCT: 36.7 % (ref 36.0–46.0)
Hemoglobin: 11.7 g/dL — ABNORMAL LOW (ref 12.0–15.0)
MCH: 28.7 pg (ref 26.0–34.0)
MCHC: 31.9 g/dL (ref 30.0–36.0)
MCV: 90.2 fL (ref 78.0–100.0)
PLATELETS: 238 10*3/uL (ref 150–400)
RBC: 4.07 MIL/uL (ref 3.87–5.11)
RDW: 15.5 % (ref 11.5–15.5)
WBC: 6.2 10*3/uL (ref 4.0–10.5)

## 2017-09-24 LAB — BASIC METABOLIC PANEL
Anion gap: 10 (ref 5–15)
BUN: 24 mg/dL — AB (ref 6–20)
CHLORIDE: 106 mmol/L (ref 101–111)
CO2: 23 mmol/L (ref 22–32)
CREATININE: 1.3 mg/dL — AB (ref 0.44–1.00)
Calcium: 8.8 mg/dL — ABNORMAL LOW (ref 8.9–10.3)
GFR calc Af Amer: 45 mL/min — ABNORMAL LOW (ref >60)
GFR calc non Af Amer: 39 mL/min — ABNORMAL LOW (ref >60)
GLUCOSE: 175 mg/dL — AB (ref 65–99)
Potassium: 4.1 mmol/L (ref 3.5–5.1)
Sodium: 139 mmol/L (ref 135–145)

## 2017-09-24 LAB — I-STAT TROPONIN, ED: Troponin i, poc: 0 ng/mL (ref 0.00–0.08)

## 2017-09-24 MED ORDER — SODIUM CHLORIDE 0.9 % IV BOLUS (SEPSIS): 500.0000 mL | Freq: Once | INTRAVENOUS | Status: DC

## 2017-09-24 MED ORDER — ADENOSINE 6 MG/2ML IV SOLN: 6.0000 mg | Freq: Once | INTRAVENOUS | Status: DC

## 2017-09-24 NOTE — Discharge Instructions (Signed)
You were seen in the ED today with SVT. We were able to get you back in a normal rhythm. Follow up with your PCP and Cardiologist in the coming week.   Return to the ED with any new or worsening symptoms.

## 2017-09-24 NOTE — ED Triage Notes (Signed)
Patient reports that she has PMH tachycardia and tries taking her diltiazem and doing val sal ma maneuver but didn't help decrease her heart rate.

## 2017-09-24 NOTE — ED Provider Notes (Signed)
Emergency Department Provider Note   I have reviewed the triage vital signs and the nursing notes.   HISTORY  Chief Complaint Tachycardia   HPI Kaitlyn Hoover is a 76 y.o. female with PMH of HTN, Hypothyroidism, A-fib with RVR, and frequent SVT presents to the emergency department for evaluation of evaluation of rapid heartbeat.  Patient has had multiple episodes like this in the past.  She states that at 4 PM she began to feel some elevated heart rate.  She took her pulse and found to be in the 120s.  She took her diltiazem and try to lay down.  At approximately 430 she began feeling much stronger palpitations and some lightheadedness.  She took her pulse again found to be in the 160s.  Patient tried Valsalva maneuvers at home which were unsuccessful so presented to the emergency department.  She is followed by cardiology, Dr. Marlou Porch, and has history of an ablation for atrial fibrillation.  She denies any chest pain or difficulty breathing.  She recently started doxycycline with a sinus infection and was also recently started on losartan.    Past Medical History:  Diagnosis Date  . Anxiety   . Arthritis   . Atrial fibrillation with RVR ([place])   . Bipolar disorder ([place])   . Dysrhythmia   . GERD (gastroesophageal reflux disease)   . Hypertension   . Hypothyroidism   . Palpitations   . PAT (paroxysmal atrial tachycardia) ([place])   . Post-polio syndrome   . Sinus infection   . 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 Atrial fibrillation ([place]) 04/25/2017  . Hyperlipidemia 03/11/2017  . Surgery, elective 08/02/2016  . Spondylolisthesis, lumbar region 07/31/2016  . Hyperlipemia 04/12/2016  . Gait disturbance 01/26/2016  . Spinal stenosis of lumbar region 01/26/2016  . Progressive focal motor weakness 01/12/2016  . Muscle atrophy 01/12/2016  . Urinary urgency 01/12/2016  . Encounter for Thalya Fouche-term (current) use of high-risk  medication 03/03/2015  . Essential hypertension 03/03/2015  . Atrial fibrillation with RVR ([place]) 04/18/2014  . Palpitations 04/18/2014  . 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 Hypertension   . 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 ABDOMINAL HYSTERECTOMY    . ABLATION OF DYSRHYTHMIC FOCUS  04/25/2017  . CATARACT EXTRACTION W/ INTRAOCULAR LENS  IMPLANT, BILATERAL    . CERVICAL LAMINECTOMY    . CESAREAN SECTION     X2   . LEGS      AGE 26-11   SEVERAL SURGERIES FOR POLIO   . THYROIDECTOMY      Current Outpatient Rx  . Order #: [id number] Class: Historical Med  . Order #: [id number] Class: Print  . Order #: [id number] Class: Historical Med  . Order #: [id number] Class: Normal  . Order #: [id number] Class: Historical Med  . Order #: [id number] Class: Historical Med  . Order #: [id number] Class: Historical Med  . Order #: [id number] Class: Historical Med  . Order #: [id number] Class: Historical Med  . Order #: [id number] Class: Historical Med  . Order #: [id number] Class: Historical Med  . Order #: [id number] Class: Historical Med  . Order #: [id number] Class: Historical Med  . Order #: [id number] Class: Normal  . Order #: [id number] Class: Historical Med  . Order #: [id number] Class: Historical Med  . Order #: [id number] Class: Historical Med  . Order #: [id number] Class: Historical Med  . Order #: [id number] Class: Historical Med  . Order #: [id number] Class: Historical  Med  . Order #: [id number] Class: Historical Med  . Order #: [id number] Class: Historical Med  . Order #: [id number] Class: Print  . Order #: [id number] Class: Historical Med  . Order #: [id number] Class: Historical Med  . Order #: [id number] Class: Historical Med    Allergies Sulfa antibiotics; Ciprocinonide [fluocinolone]; and Ciprofloxacin  Family History  Problem Relation Age of Onset  . Hypertension Mother   . Cancer Father        JAW BONE  . COPD Brother   . Atrial fibrillation Brother      Social History Social History   Tobacco Use  . Smoking status: Former Research scientist (life sciences)  . Smokeless tobacco: Never Used  . Tobacco comment: QUIT SMOKING IN 1998  Substance Use Topics  . Alcohol use: Yes    Comment: GLASS WINE W/ DINNER  . Drug use: No    Review of Systems  Constitutional: No fever/chills Eyes: No visual changes. ENT: No sore throat. Cardiovascular: Denies chest pain. Positive palpitations and lightheadedness.  Respiratory: Denies shortness of breath. Gastrointestinal: No abdominal pain.  No nausea, no vomiting.  No diarrhea.  No constipation. Genitourinary: Negative for dysuria. Musculoskeletal: Negative for back pain. Skin: Negative for rash. Neurological: Negative for headaches, focal weakness or numbness.  10-point ROS otherwise negative.  ____________________________________________   PHYSICAL EXAM:  VITAL SIGNS: ED Triage Vitals  Enc Vitals Group     BP 09/24/17 1709 131/82     Pulse Rate 09/24/17 1709 (!) 166     Resp 09/24/17 1709 20     Temp 09/24/17 1709 97.9 F (36.6 C)     Temp Source 09/24/17 1709 Oral     SpO2 09/24/17 1709 97 %     Weight 09/24/17 1711 138 lb 1.6 oz (62.6 kg)     Height 09/24/17 1720 5\' 2"  (1.575 m)    Constitutional: Alert and oriented. Well appearing and in no acute distress. Eyes: Conjunctivae are normal. Head: Atraumatic. Nose: No congestion/rhinnorhea. Mouth/Throat: Mucous membranes are moist.  Neck: No stridor.   Cardiovascular: SVT on monitor. Good peripheral circulation. Grossly normal heart sounds.   Respiratory: Normal respiratory effort.  No retractions. Lungs CTAB. Gastrointestinal: Soft and nontender. No distention.  Musculoskeletal: No lower extremity tenderness nor edema. No gross deformities of extremities. Neurologic:  Normal speech and language. No gross focal neurologic deficits are appreciated.  Skin:  Skin is warm, dry and intact. No rash noted.  ____________________________________________    LABS (all labs ordered are listed, but only abnormal results are displayed)  Labs Reviewed  BASIC METABOLIC PANEL - Abnormal; Notable for the following components:      Result Value   Glucose, Bld 175 (*)    BUN 24 (*)    Creatinine, Ser 1.30 (*)    Calcium 8.8 (*)    GFR calc non Af Amer 39 (*)    GFR calc Af Amer 45 (*)    All other components within normal limits  CBC - Abnormal; Notable for the following components:   Hemoglobin 11.7 (*)    All other components within normal limits  I-STAT TROPONIN, ED   ____________________________________________  EKG   EKG Interpretation  Date/Time:  Wednesday September 24 2017 18:03:32 EST Ventricular Rate:  74 PR Interval:    QRS Duration: 88 QT Interval:  412 QTC Calculation: 458 R Axis:   99 Text Interpretation:  Sinus rhythm Atrial premature complexes in couplets Right axis deviation Borderline repolarization abnormality No STEMI.  ST changes similar to prior tracings.  Confirmed by  Nanda Quinton ([id number]) on 09/24/2017 6:06:50 PM Also confirmed by Nanda Quinton [phone]), editor Hattie Perch [phone])  on 09/25/2017 7:28:56 AM       ____________________________________________  RADIOLOGY  None ____________________________________________   PROCEDURES  Procedure(s) performed:   .Cardioversion  Date/Time: 09/25/2017 1:32 PM  Performed by: Margette Fast, MD  Authorized by: Margette Fast, MD   Consent:    Consent obtained:  Verbal   Consent given by:  Patient   Risks discussed:  Pain and induced arrhythmia   Alternatives discussed:  Alternative treatment Pre-procedure details:    Cardioversion basis:  Emergent   Rhythm:  Supraventricular tachycardia Patient sedated: No Attempt one:    Cardioversion mode attempt one: Patient was made to blow into a 10 ml syringe for 10-15 seconds and then layed flat with the feet elevated. Patient on monitor and seen to convert to a NSR in the 80s.    Shock outcome:  Conversion  to normal sinus rhythm Post-procedure details:    Patient status:  Awake   Patient tolerance of procedure:  Tolerated well, no immediate complications  ____________________________________________   INITIAL IMPRESSION / ASSESSMENT AND PLAN / ED COURSE  Pertinent labs & imaging results that were available during my care of the patient were reviewed by me and considered in my medical decision making (see chart for details).  Patient presents to the emergency department for evaluation of SVT starting approximately 1 hour prior to ED presentation.  She has frequent episodes of this and try diltiazem and Valsalva maneuvers with no success.  On arrival to the emergency department she is found to be in SVT with rate of 160s.  While preparing for adenosine the patient was successfully cardioverted using valsalva maneuvers.  She never had chest pain or shortness of breath.  Symptoms resolved.  Plan for baseline chemistries and brief ED observation. Patient took Diltiazem PTA.   Patient remains in NSR. Labs unremarkable. Plan for outpatient Cardiology follow up. Educated patient on this Valsalva technique to try at home next time.   At this time, I do not feel there is any life-threatening condition present. I have reviewed and discussed all results (EKG, imaging, lab, urine as appropriate), exam findings with patient. I have reviewed nursing notes and appropriate previous records.  I feel the patient is safe to be discharged home without further emergent workup. Discussed usual and customary return precautions. Patient and family (if present) verbalize understanding and are comfortable with this plan.  Patient will follow-up with their primary care provider. If they do not have a primary care provider, information for follow-up has been provided to them. All questions have been answered.  ____________________________________________  FINAL CLINICAL IMPRESSION(S) / ED DIAGNOSES  Final diagnoses:  SVT  (supraventricular tachycardia) ([place])     MEDICATIONS GIVEN DURING THIS VISIT:  None  Note:  This document was prepared using Dragon voice recognition software and may include unintentional dictation errors.  Nanda Quinton, MD Emergency Medicine   Koehn Salehi, [facility], MD 09/25/17 [phone]

## 2017-09-25 ENCOUNTER — Telehealth: Payer: Self-pay | Admitting: Cardiology

## 2017-09-25 DIAGNOSIS — I10 Essential (primary) hypertension: Secondary | ICD-10-CM

## 2017-09-25 DIAGNOSIS — Z79899 Other long term (current) drug therapy: Secondary | ICD-10-CM

## 2017-09-25 NOTE — Telephone Encounter (Signed)
But stop the losartan 50 mg.  Start chlorthalidone 25 mg once a day.  Repeat basic metabolic profile in 1 week.  Continue to monitor blood pressures.  Agree with arm cuff. Candee Furbish, MD

## 2017-09-25 NOTE — Telephone Encounter (Signed)
Lm to cb.

## 2017-09-25 NOTE — Telephone Encounter (Signed)
Patient calling:  Reason for call: Patient went to ED yesterday for SVT and states that her HR was 165. Patient is asking that you f/u with her.

## 2017-09-26 MED ORDER — CHLORTHALIDONE 25 MG PO TABS: 25.0000 mg | ORAL_TABLET | Freq: Every day | ORAL | 3 refills | Status: DC

## 2017-09-26 NOTE — Telephone Encounter (Signed)
Follow up     Patient returning call. States to please call her after 3pm.  Pt c/o BP issue: STAT if pt c/o blurred vision, one-sided weakness or slurred speech  1. What are your last 5 BP readings? 166/75, 166/74, 138/68  2. Are you having any other symptoms (ex. Dizziness, headache, blurred vision, passed out)? NO 3. What is your BP issue?  BP not controlled patient

## 2017-09-26 NOTE — Telephone Encounter (Signed)
Spoke with patient about her recent trip to the ED for SVT.  She has been feeling OK since then.  Her BP has continued to be elevated in the AM.  Reviewed with her Dr Marlou Porch orders to stop Losartan and start Chlorthalidone 25 mg daily and have blood work in 1 week.  Order sent into [facility] as requested, order placed for BMP and scheduled.  She will continue to monitor her BP and call back if she doesn't see improvement.   Also reviewed the results of her recent carotid doppler.

## 2017-09-26 NOTE — Telephone Encounter (Signed)
Lm to cb.

## 2017-09-26 NOTE — Telephone Encounter (Signed)
From previous phone note RE: BP before ED visit for SVT  Jerline Pain, MD  You 16 hours ago (5:13 PM)      But stop the losartan 50 mg.  Start chlorthalidone 25 mg once a day.  Repeat basic metabolic profile in 1 week.  Continue to monitor blood pressures.  Agree with arm cuff. Candee Furbish, MD       Documentation

## 2017-10-02 DIAGNOSIS — F28 Other psychotic disorder not due to a substance or known physiological condition: Secondary | ICD-10-CM | POA: Diagnosis not present

## 2017-10-07 ENCOUNTER — Other Ambulatory Visit: Payer: PPO | Admitting: *Deleted

## 2017-10-07 DIAGNOSIS — I10 Essential (primary) hypertension: Secondary | ICD-10-CM

## 2017-10-07 DIAGNOSIS — Z79899 Other long term (current) drug therapy: Secondary | ICD-10-CM

## 2017-10-07 LAB — BASIC METABOLIC PANEL
BUN / CREAT RATIO: 24 (ref 12–28)
BUN: 24 mg/dL (ref 8–27)
CO2: 27 mmol/L (ref 20–29)
CREATININE: 0.98 mg/dL (ref 0.57–1.00)
Calcium: 9.4 mg/dL (ref 8.7–10.3)
Chloride: 99 mmol/L (ref 96–106)
GFR calc Af Amer: 65 mL/min/{1.73_m2} (ref >59)
GFR calc non Af Amer: 57 mL/min/{1.73_m2} — ABNORMAL LOW (ref >59)
GLUCOSE: 122 mg/dL — AB (ref 65–99)
Potassium: 3.6 mmol/L (ref 3.5–5.2)
SODIUM: 140 mmol/L (ref 134–144)

## 2017-10-14 DIAGNOSIS — J342 Deviated nasal septum: Secondary | ICD-10-CM | POA: Diagnosis not present

## 2017-10-14 DIAGNOSIS — J310 Chronic rhinitis: Secondary | ICD-10-CM | POA: Diagnosis not present

## 2017-10-14 DIAGNOSIS — J343 Hypertrophy of nasal turbinates: Secondary | ICD-10-CM | POA: Diagnosis not present

## 2017-10-16 ENCOUNTER — Telehealth: Payer: Self-pay | Admitting: Cardiology

## 2017-10-16 NOTE — Telephone Encounter (Signed)
Patient calling, would like to report that her BP [place] great!

## 2017-10-29 ENCOUNTER — Other Ambulatory Visit: Payer: Self-pay | Admitting: Cardiology

## 2017-11-03 ENCOUNTER — Other Ambulatory Visit: Payer: Self-pay | Admitting: Family Medicine

## 2017-11-03 DIAGNOSIS — M4726 Other spondylosis with radiculopathy, lumbar region: Secondary | ICD-10-CM | POA: Diagnosis not present

## 2017-11-03 DIAGNOSIS — Z1231 Encounter for screening mammogram for malignant neoplasm of breast: Secondary | ICD-10-CM

## 2017-11-03 DIAGNOSIS — M4316 Spondylolisthesis, lumbar region: Secondary | ICD-10-CM | POA: Diagnosis not present

## 2017-11-05 ENCOUNTER — Other Ambulatory Visit: Payer: Self-pay | Admitting: Cardiology

## 2017-11-05 ENCOUNTER — Telehealth: Payer: Self-pay | Admitting: Cardiology

## 2017-11-05 ENCOUNTER — Ambulatory Visit: Payer: PPO | Admitting: Cardiology

## 2017-11-05 ENCOUNTER — Encounter: Payer: Self-pay | Admitting: Cardiology

## 2017-11-05 VITALS — BP 132/64 | HR 56 | Ht 62.0 in | Wt 132.4 lb

## 2017-11-05 DIAGNOSIS — I471 Supraventricular tachycardia: Secondary | ICD-10-CM | POA: Diagnosis not present

## 2017-11-05 DIAGNOSIS — I10 Essential (primary) hypertension: Secondary | ICD-10-CM | POA: Diagnosis not present

## 2017-11-05 DIAGNOSIS — I480 Paroxysmal atrial fibrillation: Secondary | ICD-10-CM | POA: Diagnosis not present

## 2017-11-05 NOTE — Progress Notes (Signed)
Electrophysiology Office Note   Date:  11/05/2017   ID:  Kaitlyn Hoover, DOB April 25, 1941, MRN [id number]  PCP:  [name], MD  Cardiologist:  Marlou Porch Primary Electrophysiologist:  Zandra Lajeunesse Meredith Leeds, MD    Chief Complaint  Patient presents with  . Follow-up    SVT/PAF     History of Present Illness: Kaitlyn Hoover is a 76 y.o. female who is being seen today for the evaluation of SVT, atrial fibrillation at the request of [name], MD. Presenting today for electrophysiology evaluation. She had multiple episodes of ER visits due to atrial fibrillation. She was initially put on diltiazem 180 mg which has improved her symptoms. Her episodes of SVT have been converted with Valsalva. She had AF ablation 04/25/17 and is presenting for follow up.  Today, denies symptoms of palpitations, chest pain, shortness of breath, orthopnea, PND, lower extremity edema, claudication, dizziness, presyncope, syncope, bleeding, or neurologic sequela. The patient is tolerating medications without difficulties.  Continue to have episodes of SVT.  No further episodes that she knows about of atrial fibrillation.  She did have to go to the emergency room and had vagal maneuvers done which broke her to sinus rhythm.   Past Medical History:  Diagnosis Date  . Anxiety   . Arthritis   . Atrial fibrillation with RVR ([place])   . Bipolar disorder ([place])   . Dysrhythmia   . GERD (gastroesophageal reflux disease)   . Hypertension   . Hypothyroidism   . Palpitations   . PAT (paroxysmal atrial tachycardia) ([place])   . Post-polio syndrome   . Sinus infection   . 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 ABDOMINAL HYSTERECTOMY    . ABLATION OF DYSRHYTHMIC FOCUS  04/25/2017  . ATRIAL FIBRILLATION ABLATION N/A 04/25/2017   Procedure: Atrial Fibrillation Ablation;  Surgeon: Constance Haw, MD;  Location: [facility];  Service: Cardiovascular;  Laterality:  N/A;  . CATARACT EXTRACTION W/ INTRAOCULAR LENS  IMPLANT, BILATERAL    . CERVICAL LAMINECTOMY    . CESAREAN SECTION     X2   . LEGS      AGE 1-11   SEVERAL SURGERIES FOR POLIO   . THYROIDECTOMY       Current Outpatient Medications  Medication Sig Dispense Refill  . acyclovir (ZOVIRAX) 400 MG tablet Take 400 mg at bedtime by mouth.     Marland Kitchen alendronate (FOSAMAX) 70 MG tablet Take 70 mg by mouth every Thursday. Take with a full glass of water on an empty stomach.     Marland Kitchen apixaban (ELIQUIS) 5 MG TABS tablet Take 1 tablet (5 mg total) by mouth 2 (two) times daily. 180 tablet 3  . Ascorbic Acid (VITAMIN C) 1000 MG tablet Take 1,000 mg by mouth daily.    Marland Kitchen atenolol (TENORMIN) 50 MG tablet Take 1 tablet (50 mg total) by mouth 2 (two) times daily. 180 tablet 3  . Calcium Citrate-Vitamin D (CALCIUM CITRATE + D PO) Take 2 tablets by mouth every evening.    . chlorthalidone (HYGROTON) 25 MG tablet Take 1 tablet (25 mg total) daily by mouth. 90 tablet 3  . Coenzyme Q10 (CO Q-10) 100 MG CAPS Take 100 mg by mouth daily.     Marland Kitchen D-Ribose (RIBOSE, D,) POWD 2 g by Does not apply route daily.    Marland Kitchen doxycycline (MONODOX) 100 MG capsule Take 100 mg 2 (two) times daily by  mouth.     . fexofenadine (ALLEGRA) 180 MG tablet Take 180 mg by mouth daily.    . fluticasone (FLONASE) 50 MCG/ACT nasal spray Place 2 sprays into both nostrils daily.    . Ginkgo Biloba 120 MG TABS Take 240 mg by mouth daily.    Marland Kitchen iloperidone (FANAPT) 4 MG TABS tablet Take 1 mg daily by mouth. Quarter tablet=1 mg    . levothyroxine (SYNTHROID, LEVOTHROID) 100 MCG tablet Take 100 mcg by mouth daily.    . Melatonin 3 MG TABS Take 3 mg by mouth at bedtime.    . Multiple Vitamin (MULTIVITAMIN) capsule Take 1 capsule daily by mouth.     . nitrofurantoin, macrocrystal-monohydrate, (MACROBID) 100 MG capsule Take 100 mg 2 (two) times daily by mouth.  0  . Omega-3 Fatty Acids (FISH OIL BURP-LESS) 1000 MG CAPS Take 3,000 Units by mouth 2 (two) times  daily.    Marland Kitchen omeprazole (PRILOSEC) 20 MG capsule Take 20 mg by mouth every morning.    Marland Kitchen oxybutynin (DITROPAN-XL) 5 MG 24 hr tablet Take 5 mg by mouth daily.    . polyethylene glycol (MIRALAX / GLYCOLAX) packet Take 17 g by mouth daily.     . rosuvastatin (CRESTOR) 10 MG tablet Take one tablet up to 4 times weekly , Sunday Monday, Wednesday and Friday - Oral 30 tablet 10  . temazepam (RESTORIL) 15 MG capsule Take 15 mg by mouth at bedtime.     . traMADol (ULTRAM) 50 MG tablet Take 1 tablet (50 mg total) by mouth 3 (three) times daily as needed. (Patient taking differently: Take 50 mg 3 (three) times daily as needed by mouth for moderate pain. ) 90 tablet 1  . ziprasidone (GEODON) 40 MG capsule Take 40 mg by mouth daily.     No current facility-administered medications for this visit.     Allergies:   Sulfa antibiotics; Ciprocinonide [fluocinolone]; and Ciprofloxacin   Social History:  The patient  reports that she has quit smoking. she has never used smokeless tobacco. She reports that she drinks alcohol. She reports that she does not use drugs.   Family History:  The patient's family history includes Atrial fibrillation in her brother; COPD in her brother; Cancer in her father; Hypertension in her mother.   ROS:  Please see the history of present illness.   Otherwise, review of systems is positive for cough.   All other systems are reviewed and negative.   PHYSICAL EXAM: VS:  BP 132/64   Pulse (!) 56   Ht 5\' 2"  (1.575 m)   Wt 132 lb 6.4 oz (60.1 kg)   LMP  (LMP Unknown)   BMI 24.22 kg/m  , BMI Body mass index is 24.22 kg/m. GEN: Well nourished, well developed, in no acute distress  HEENT: normal  Neck: no JVD, carotid bruits, or masses Cardiac: RRR; no murmurs, rubs, or gallops,no edema  Respiratory:  clear to auscultation bilaterally, normal work of breathing GI: soft, nontender, nondistended, + BS MS: no deformity or atrophy  Skin: warm and dry Neuro:  Strength and sensation  are intact Psych: euthymic mood, full affect  EKG:  EKG is not ordered today. Personal review of the ekg ordered 09/24/17 shows SVT    Recent Labs: 04/02/2017: ALT 15 09/24/2017: Hemoglobin 11.7; Platelets 238 10/07/2017: BUN 24; Creatinine, Ser 0.98; Potassium 3.6; Sodium 140    Lipid Panel     Component Value Date/Time   CHOL 136 02/12/2017 1013   TRIG 114  02/12/2017 1013   HDL 46 02/12/2017 1013   CHOLHDL 3.0 02/12/2017 1013   CHOLHDL 3.5 10/22/2016 1039   VLDL 19 10/22/2016 1039   LDLCALC 67 02/12/2017 1013     Wt Readings from Last 3 Encounters:  11/05/17 132 lb 6.4 oz (60.1 kg)  09/24/17 132 lb (59.9 kg)  09/17/17 135 lb 9.6 oz (61.5 kg)      Other studies Reviewed: Additional studies/ records that were reviewed today include: TTE 2015  Review of the above records today demonstrates:  - Left ventricle: The cavity size was normal. Wall thickness was normal. Systolic function was normal. The estimated ejection fraction was in the range of 60% to 65%. Wall motion was normal; there were no regional wall motion abnormalities. - Left atrium: The atrium was mildly dilated. - Atrial septum: No defect or patent foramen ovale was identified. - Pulmonary arteries: PA peak pressure: 32 mm Hg (S).   ASSESSMENT AND PLAN:  1.  Paroxysmal atrial fibrillation: Currently on Eliquis and atenolol.  A. fib ablation 04/25/17.  Has had no further recurrences.  No changes.  This patients CHA2DS2-VASc Score and unadjusted Ischemic Stroke Rate (% per year) is equal to 7.2 % stroke rate/year from a score of 5  Above score calculated as 1 point each if present [CHF, HTN, DM, Vascular=MI/PAD/Aortic Plaque, Age if 65-74, or Female] Above score calculated as 2 points each if present [Age > 75, or Stroke/TIA/TE]  2. Hypertension: Well-controlled.  No medication changes.   3. Carotid artery plaque: Continue statin  4. SVT: Has started to have episodes of SVT on atenolol.  Valsalva  maneuvers do work to get her back to sinus rhythm.  She would like to have ablation for this.  Risks and benefits include bleeding, tamponade, heart block, and stroke among others.  She was told a 9-7% chance of complications.  She understands these risks and is agreed to the procedure.  Current medicines are reviewed at length with the patient today.   The patient does not have concerns regarding her medicines.  The following changes were made today:  none  Labs/ tests ordered today include:  No orders of the defined types were placed in this encounter.    Disposition:   FU with Janira Mandell 3 months  Signed, Tully Burgo Meredith Leeds, MD  11/05/2017 10:40 AM     [facility] [address] [place] [address] [phone] (office) [phone] (fax)

## 2017-11-05 NOTE — Patient Instructions (Addendum)
Medication Instructions:  Your physician recommends that you continue on your current medications as directed. Please refer to the Current Medication list given to you today.  * If you need a refill on your cardiac medications before your next appointment, please call your pharmacy. *  Labwork: None ordered  Testing/Procedures: Your physician has recommended that you have an SVT ablation. Catheter ablation is a medical procedure used to treat some cardiac arrhythmias (irregular heartbeats). During catheter ablation, a long, thin, flexible tube is put into a blood vessel in your groin (upper thigh), or neck. This tube is called an ablation catheter. It is then guided to your heart through the blood vessel. Radio frequency waves destroy small areas of heart tissue where abnormal heartbeats may cause an arrhythmia to start.   Please call the office when you are ready to schedule this procedure.  Follow-Up: To be determined once procedure is scheduled.  Thank you for choosing [facility]!!   Trinidad Curet, RN [phone]  Any Other Special Instructions Will Be Listed Below (If Applicable).   Cardiac Ablation Cardiac ablation is a procedure to disable (ablate) a small amount of heart tissue in very specific places. The heart has many electrical connections. Sometimes these connections are abnormal and can cause the heart to beat very fast or irregularly. Ablating some of the problem areas can improve the heart rhythm or return it to normal. Ablation may be done for people who:  Have Wolff-Parkinson-White syndrome.  Have fast heart rhythms (tachycardia).  Have taken medicines for an abnormal heart rhythm (arrhythmia) that were not effective or caused side effects.  Have a high-risk heartbeat that may be life-threatening.  During the procedure, a small incision is made in the neck or the groin, and a long, thin, flexible tube (catheter) is inserted into the incision and moved to the  heart. Small devices (electrodes) on the tip of the catheter will send out electrical currents. A type of X-ray (fluoroscopy) will be used to help guide the catheter and to provide images of the heart. Tell a health care provider about:  Any allergies you have.  All medicines you are taking, including vitamins, herbs, eye drops, creams, and over-the-counter medicines.  Any problems you or family members have had with anesthetic medicines.  Any blood disorders you have.  Any surgeries you have had.  Any medical conditions you have, such as kidney failure.  Whether you are pregnant or may be pregnant. What are the risks? Generally, this is a safe procedure. However, problems may occur, including:  Infection.  Bruising and bleeding at the catheter insertion site.  Bleeding into the chest, especially into the sac that surrounds the heart. This is a serious complication.  Stroke or blood clots.  Damage to other structures or organs.  Allergic reaction to medicines or dyes.  Need for a permanent pacemaker if the normal electrical system is damaged. A pacemaker is a small computer that sends electrical signals to the heart and helps your heart beat normally.  The procedure not being fully effective. This may not be recognized until months later. Repeat ablation procedures are sometimes required.  What happens before the procedure?  Follow instructions from your health care provider about eating or drinking restrictions.  Ask your health care provider about: ? Changing or stopping your regular medicines. This is especially important if you are taking diabetes medicines or blood thinners. ? Taking medicines such as aspirin and ibuprofen. These medicines can thin your blood. Do not take  these medicines before your procedure if your health care provider instructs you not to.  Plan to have someone take you home from the hospital or clinic.  If you will be going home right after the  procedure, plan to have someone with you for 24 hours. What happens during the procedure?  To lower your risk of infection: ? Your health care team will wash or sanitize their hands. ? Your skin will be washed with soap. ? Hair may be removed from the incision area.  An IV tube will be inserted into one of your veins.  You will be given a medicine to help you relax (sedative).  The skin on your neck or groin will be numbed.  An incision will be made in your neck or your groin.  A needle will be inserted through the incision and into a large vein in your neck or groin.  A catheter will be inserted into the needle and moved to your heart.  Dye may be injected through the catheter to help your surgeon see the area of the heart that needs treatment.  Electrical currents will be sent from the catheter to ablate heart tissue in desired areas. There are three types of energy that may be used to ablate heart tissue: ? Heat (radiofrequency energy). ? Laser energy. ? Extreme cold (cryoablation).  When the necessary tissue has been ablated, the catheter will be removed.  Pressure will be held on the catheter insertion area to prevent excessive bleeding.  A bandage (dressing) will be placed over the catheter insertion area. The procedure may vary among health care providers and hospitals. What happens after the procedure?  Your blood pressure, heart rate, breathing rate, and blood oxygen level will be monitored until the medicines you were given have worn off.  Your catheter insertion area will be monitored for bleeding. You will need to lie still for a few hours to ensure that you do not bleed from the catheter insertion area.  Do not drive for 24 hours or as long as directed by your health care provider. Summary  Cardiac ablation is a procedure to disable (ablate) a small amount of heart tissue in very specific places. Ablating some of the problem areas can improve the heart rhythm or  return it to normal.  During the procedure, electrical currents will be sent from the catheter to ablate heart tissue in desired areas. This information is not intended to replace advice given to you by your health care provider. Make sure you discuss any questions you have with your health care provider. Document Released: 03/23/2009 Document Revised: 09/23/2016 Document Reviewed: 09/23/2016 Elsevier Interactive Patient Education  [employer].

## 2017-11-05 NOTE — Telephone Encounter (Signed)
New message    Patient said the 7th and the 9th are both good days for January for the ablation

## 2017-11-13 ENCOUNTER — Encounter: Payer: Self-pay | Admitting: *Deleted

## 2017-11-13 NOTE — Telephone Encounter (Signed)
Left detailed message informing pt I would schedule her for 11/26/17.  Explained that I would call her back tomorrow or she can call me beforehand if needed.

## 2017-11-13 NOTE — Telephone Encounter (Signed)
F/U call: Patient calling states that she needs some information about the ablation.

## 2017-11-20 NOTE — Telephone Encounter (Signed)
Instructions reviewed with patient. Patient verbalized understanding and agreeable to plan.   Pt hoping that she will not have to stay overnight for her procedure.  Informed that I would forward to Dr. Curt Bears for his FYI.  (SVT ablation 1/9 @ 10:30 a.m. following AFlutter ablation)

## 2017-11-26 ENCOUNTER — Ambulatory Visit (HOSPITAL_COMMUNITY)
Admission: RE | Admit: 2017-11-26 | Discharge: 2017-11-26 | Disposition: A | Discharge status: Home / Self Care | Payer: PPO | Source: Ambulatory Visit | Attending: Cardiology | Admitting: Cardiology

## 2017-11-26 ENCOUNTER — Encounter (HOSPITAL_COMMUNITY)
Admission: RE | Disposition: A | Discharge status: Home / Self Care | Payer: Self-pay | Source: Ambulatory Visit | Attending: Cardiology

## 2017-11-26 DIAGNOSIS — Z79890 Hormone replacement therapy (postmenopausal): Secondary | ICD-10-CM | POA: Diagnosis not present

## 2017-11-26 DIAGNOSIS — Z7901 Long term (current) use of anticoagulants: Secondary | ICD-10-CM | POA: Diagnosis not present

## 2017-11-26 DIAGNOSIS — E890 Postprocedural hypothyroidism: Secondary | ICD-10-CM | POA: Insufficient documentation

## 2017-11-26 DIAGNOSIS — Z7951 Long term (current) use of inhaled steroids: Secondary | ICD-10-CM | POA: Diagnosis not present

## 2017-11-26 DIAGNOSIS — F319 Bipolar disorder, unspecified: Secondary | ICD-10-CM | POA: Diagnosis not present

## 2017-11-26 DIAGNOSIS — I471 Supraventricular tachycardia: Secondary | ICD-10-CM | POA: Diagnosis not present

## 2017-11-26 DIAGNOSIS — Z79899 Other long term (current) drug therapy: Secondary | ICD-10-CM | POA: Insufficient documentation

## 2017-11-26 DIAGNOSIS — F419 Anxiety disorder, unspecified: Secondary | ICD-10-CM | POA: Diagnosis not present

## 2017-11-26 DIAGNOSIS — Z87891 Personal history of nicotine dependence: Secondary | ICD-10-CM | POA: Diagnosis not present

## 2017-11-26 DIAGNOSIS — I10 Essential (primary) hypertension: Secondary | ICD-10-CM | POA: Insufficient documentation

## 2017-11-26 DIAGNOSIS — K219 Gastro-esophageal reflux disease without esophagitis: Secondary | ICD-10-CM | POA: Insufficient documentation

## 2017-11-26 DIAGNOSIS — G14 Postpolio syndrome: Secondary | ICD-10-CM | POA: Insufficient documentation

## 2017-11-26 DIAGNOSIS — I4891 Unspecified atrial fibrillation: Secondary | ICD-10-CM | POA: Insufficient documentation

## 2017-11-26 HISTORY — PX: SVT ABLATION: EP1225

## 2017-11-26 LAB — POTASSIUM: Potassium: 3.6 mmol/L (ref 3.5–5.1)

## 2017-11-26 LAB — CBC
HEMATOCRIT: 39.5 % (ref 36.0–46.0)
Hemoglobin: 12.5 g/dL (ref 12.0–15.0)
MCH: 29.1 pg (ref 26.0–34.0)
MCHC: 31.6 g/dL (ref 30.0–36.0)
MCV: 91.9 fL (ref 78.0–100.0)
Platelets: 235 10*3/uL (ref 150–400)
RBC: 4.3 MIL/uL (ref 3.87–5.11)
RDW: 14.7 % (ref 11.5–15.5)
WBC: 6.9 10*3/uL (ref 4.0–10.5)

## 2017-11-26 LAB — BASIC METABOLIC PANEL
ANION GAP: 7 (ref 5–15)
BUN: 17 mg/dL (ref 6–20)
CHLORIDE: 101 mmol/L (ref 101–111)
CO2: 27 mmol/L (ref 22–32)
Calcium: 8.9 mg/dL (ref 8.9–10.3)
Creatinine, Ser: 0.99 mg/dL (ref 0.44–1.00)
GFR, EST NON AFRICAN AMERICAN: 54 mL/min — AB (ref >60)
Glucose, Bld: 93 mg/dL (ref 65–99)
POTASSIUM: 5.9 mmol/L — AB (ref 3.5–5.1)
SODIUM: 135 mmol/L (ref 135–145)

## 2017-11-26 SURGERY — SVT ABLATION

## 2017-11-26 MED ORDER — BUPIVACAINE HCL (PF) 0.25 % IJ SOLN
INTRAMUSCULAR | Status: AC
  Filled 2017-11-26: qty 60

## 2017-11-26 MED ORDER — MIDAZOLAM HCL 5 MG/5ML IJ SOLN
INTRAMUSCULAR | Status: DC | PRN
  Administered 2017-11-26 (×4): 1 mg via INTRAVENOUS

## 2017-11-26 MED ORDER — BUPIVACAINE HCL (PF) 0.25 % IJ SOLN
INTRAMUSCULAR | Status: DC | PRN
  Administered 2017-11-26: 60 mL

## 2017-11-26 MED ORDER — FENTANYL CITRATE (PF) 100 MCG/2ML IJ SOLN
INTRAMUSCULAR | Status: AC
  Filled 2017-11-26: qty 2

## 2017-11-26 MED ORDER — HEPARIN (PORCINE) IN NACL 2-0.9 UNIT/ML-% IJ SOLN
INTRAMUSCULAR | Status: AC
  Filled 2017-11-26: qty 500

## 2017-11-26 MED ORDER — SODIUM CHLORIDE 0.9 % IV SOLN
INTRAVENOUS | Status: DC | PRN
  Administered 2017-11-26: 2 ug/min via INTRAVENOUS

## 2017-11-26 MED ORDER — FENTANYL CITRATE (PF) 100 MCG/2ML IJ SOLN
INTRAMUSCULAR | Status: DC | PRN
  Administered 2017-11-26 (×4): 25 ug via INTRAVENOUS

## 2017-11-26 MED ORDER — MIDAZOLAM HCL 5 MG/5ML IJ SOLN
INTRAMUSCULAR | Status: AC
  Filled 2017-11-26: qty 5

## 2017-11-26 MED ORDER — HEPARIN (PORCINE) IN NACL 2-0.9 UNIT/ML-% IJ SOLN
INTRAMUSCULAR | Status: AC | PRN
  Administered 2017-11-26: 500 mL

## 2017-11-26 MED ORDER — ISOPROTERENOL HCL 0.2 MG/ML IJ SOLN
INTRAMUSCULAR | Status: AC
  Filled 2017-11-26: qty 5

## 2017-11-26 SURGICAL SUPPLY — 14 items
BAG SNAP BAND KOVER 36X36 (MISCELLANEOUS) ×3 IMPLANT
BLANKET WARM UNDERBOD FULL ACC (MISCELLANEOUS) ×3 IMPLANT
CATH EZ STEER NAV 4MM D-F CUR (ABLATOR) ×2 IMPLANT
CATH JOSEPH QUAD ALLRED 6F REP (CATHETERS) ×2 IMPLANT
CATH JOSEPHSON QUAD-ALLRED 6FR (CATHETERS) ×2 IMPLANT
CATH WEBSTER BI DIR CS D-F CRV (CATHETERS) ×2 IMPLANT
PACK EP LATEX FREE (CUSTOM PROCEDURE TRAY) ×3
PACK EP LF (CUSTOM PROCEDURE TRAY) ×1 IMPLANT
PAD DEFIB LIFELINK (PAD) ×3 IMPLANT
PATCH CARTO3 (PAD) ×2 IMPLANT
SHEATH PINNACLE 6F 10CM (SHEATH) ×4 IMPLANT
SHEATH PINNACLE 7F 10CM (SHEATH) ×2 IMPLANT
SHEATH PINNACLE 8F 10CM (SHEATH) ×2 IMPLANT
SHIELD RADPAD SCOOP 12X17 (MISCELLANEOUS) ×3 IMPLANT

## 2017-11-26 NOTE — H&P (Signed)
Kaitlyn Hoover has presented today for surgery, with the diagnosis of SVT.  The various methods of treatment have been discussed with the patient and family. After consideration of risks, benefits and other options for treatment, the patient has consented to  Procedure(s): Catheter ablation as a surgical intervention .  Risks include but not limited to bleeding, tamponade, heart block, stroke, damage to surrounding organs, among others. The patient's history has been reviewed, patient examined, no change in status, stable for surgery.  I have reviewed the patient's chart and labs.  Questions were answered to the patient's satisfaction.    Kaitlyn Pettijohn Curt Bears, MD 11/26/2017 8:52 AM

## 2017-11-26 NOTE — Discharge Instructions (Signed)
Post ablation instructions  No driving for 4 days. No lifting over 5 lbs for 1 week. No vigorous or sexual activity for 1 week. You may return to work in one week. Keep procedure site clean & dry. If you notice increased pain, swelling, bleeding or pus, call/return!  You may shower, but no soaking baths/hot tubs/pools for 1 week.  Cardiac Ablation, Care After This sheet gives you information about how to care for yourself after your procedure. Your health care provider may also give you more specific instructions. If you have problems or questions, contact your health care provider. What can I expect after the procedure? After the procedure, it is common to have:  Bruising around your puncture site.  Tenderness around your puncture site.  Skipped heartbeats.  Tiredness (fatigue).  Follow these instructions at home: Puncture site care  Follow instructions from your health care provider about how to take care of your puncture site. Make sure you: ? Wash your hands with soap and water before you change your bandage (dressing). If soap and water are not available, use hand sanitizer. ? Change your dressing as told by your health care provider. ? Leave stitches (sutures), skin glue, or adhesive strips in place. These skin closures may need to stay in place for up to 2 weeks. If adhesive strip edges start to loosen and curl up, you may trim the loose edges. Do not remove adhesive strips completely unless your health care provider tells you to do that.  Check your puncture site every day for signs of infection. Check for: ? Redness, swelling, or pain. ? Fluid or blood. If your puncture site starts to bleed, lie down on your back, apply firm pressure to the area, and contact your health care provider. ? Warmth. ? Pus or a bad smell. Driving  Ask your health care provider when it is safe for you to drive again after the procedure.  Do not drive or use heavy machinery while taking prescription  pain medicine.  Do not drive for 24 hours if you were given a medicine to help you relax (sedative) during your procedure. Activity  Avoid activities that take a lot of effort for at least 3 days after your procedure.  Do not lift anything that is heavier than 10 lb (4.5 kg), or the limit that you are told, until your health care provider says that it is safe.  Return to your normal activities as told by your health care provider. Ask your health care provider what activities are safe for you. General instructions  Take over-the-counter and prescription medicines only as told by your health care provider.  Do not use any products that contain nicotine or tobacco, such as cigarettes and e-cigarettes. If you need help quitting, ask your health care provider.  Do not take baths, swim, or use a hot tub until your health care provider approves.  Do not drink alcohol for 24 hours after your procedure.  Keep all follow-up visits as told by your health care provider. This is important. Contact a health care provider if:  You have redness, mild swelling, or pain around your puncture site.  You have fluid or blood coming from your puncture site that stops after applying firm pressure to the area.  Your puncture site feels warm to the touch.  You have pus or a bad smell coming from your puncture site.  You have a fever.  You have chest pain or discomfort that spreads to your neck, jaw, or  arm.  You are sweating a lot.  You feel nauseous.  You have a fast or irregular heartbeat.  You have shortness of breath.  You are dizzy or light-headed and feel the need to lie down.  You have pain or numbness in the arm or leg closest to your puncture site. Get help right away if:  Your puncture site suddenly swells.  Your puncture site is bleeding and the bleeding does not stop after applying firm pressure to the area. These symptoms may represent a serious problem that is an emergency. Do  not wait to see if the symptoms will go away. Get medical help right away. Call your local emergency services (911 in the [place] Do not drive yourself to the hospital. Summary  After the procedure, it is normal to have bruising and tenderness at the puncture site in your groin, neck, or forearm.  Check your puncture site every day for signs of infection.  Get help right away if your puncture site is bleeding and the bleeding does not stop after applying firm pressure to the area. This is a medical emergency. This information is not intended to replace advice given to you by your health care provider. Make sure you discuss any questions you have with your health care provider. Document Released: 02/13/2017 Document Revised: 02/13/2017 Document Reviewed: 02/13/2017 Elsevier Interactive Patient Education  2018 [employer].

## 2017-11-26 NOTE — Progress Notes (Signed)
[name] sheaths removed 2 from the right femoral vein first, followed by 2 from the left removed. Manual pressure was held over both sites for 10 minutes each site, without complications.  No bleeding, drainage, or hematoma from either site noted. Bed rest to begin at 1430. Kaitlyn Hoover is pain free at this time and understands directions for care of sites.

## 2017-11-27 ENCOUNTER — Encounter (HOSPITAL_COMMUNITY): Payer: Self-pay | Admitting: Cardiology

## 2017-12-05 ENCOUNTER — Ambulatory Visit
Admission: RE | Admit: 2017-12-05 | Discharge: 2017-12-05 | Disposition: A | Discharge status: Home / Self Care | Payer: PPO | Source: Ambulatory Visit | Attending: Family Medicine | Admitting: Family Medicine

## 2017-12-05 DIAGNOSIS — Z1231 Encounter for screening mammogram for malignant neoplasm of breast: Secondary | ICD-10-CM

## 2017-12-12 ENCOUNTER — Telehealth: Payer: Self-pay | Admitting: Cardiology

## 2017-12-12 NOTE — Telephone Encounter (Signed)
Pt returned called

## 2017-12-12 NOTE — Telephone Encounter (Signed)
°  STAT if HR is under 50 or over 120 (normal HR is 60-100 beats per minute)  What is your heart rate? 71 on 12/10/2017 1) Do you have a log of your heart rate readings (document readings)?  no  2) Do you have any other symptoms? no

## 2017-12-12 NOTE — Telephone Encounter (Signed)
Reports Afib on 1/23 that last about an hour.  She experienced "shivering, knot in diaphragm and felt like I was freezing".   She had a glass of wine ("not even 5 oz") an hr prior to occurrence and this was the first time in 6 months. She suspects this may have precipitated the event. She listened to her heart w/ her stethoscope and could tell she was irregular. Not tachy, HR 71. She just wanted to let us know.  She will continue to monitor and call back if she has further concerns.

## 2017-12-26 DIAGNOSIS — H35372 Puckering of macula, left eye: Secondary | ICD-10-CM | POA: Diagnosis not present

## 2017-12-30 ENCOUNTER — Ambulatory Visit: Payer: PPO | Admitting: Cardiology

## 2017-12-30 ENCOUNTER — Encounter: Payer: Self-pay | Admitting: Cardiology

## 2017-12-30 VITALS — BP 124/66 | HR 58 | Ht 64.0 in | Wt 136.0 lb

## 2017-12-30 DIAGNOSIS — I10 Essential (primary) hypertension: Secondary | ICD-10-CM | POA: Diagnosis not present

## 2017-12-30 DIAGNOSIS — I471 Supraventricular tachycardia: Secondary | ICD-10-CM | POA: Diagnosis not present

## 2017-12-30 DIAGNOSIS — I480 Paroxysmal atrial fibrillation: Secondary | ICD-10-CM | POA: Diagnosis not present

## 2017-12-30 NOTE — Progress Notes (Signed)
Electrophysiology Office Note   Date:  12/30/2017   ID:  Kaitlyn Hoover, DOB 09-Jun-1941, MRN [id number]  PCP:  [name], MD  Cardiologist:  Marlou Porch Primary Electrophysiologist:  Gyasi Hazzard Meredith Leeds, MD    No chief complaint on file.    History of Present Illness: Kaitlyn Hoover is a 77 y.o. female who is being seen today for the evaluation of SVT, atrial fibrillation at the request of [name], MD. Presenting today for electrophysiology evaluation. She had multiple episodes of ER visits due to atrial fibrillation. She was initially put on diltiazem 180 mg which has improved her symptoms. Her episodes of SVT have been converted with Valsalva. She had AF ablation 04/25/17 and is presenting for follow up. 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  Since ablation, she has had 2 episodes of palpitations.  The palpitations have lasted between 30 minutes to a few hours.  She gets quite weak and fatigued.  She has laid down and they went away on their own.  The longest episode, she did consider going to the emergency room.  Today, denies symptoms of chest pain, shortness of breath, orthopnea, PND, lower extremity edema, claudication, dizziness, presyncope, syncope, bleeding, or neurologic sequela. The patient is tolerating medications without difficulties.     Past Medical History:  Diagnosis Date  . Anxiety   . Arthritis   . Atrial fibrillation with RVR ([place])   . Bipolar disorder ([place])   . Dysrhythmia   . GERD (gastroesophageal reflux disease)   . Hypertension   . Hypothyroidism   . Palpitations   . PAT (paroxysmal atrial tachycardia) ([place])   . Post-polio syndrome   . Sinus infection   . 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 ABDOMINAL HYSTERECTOMY    . ABLATION OF DYSRHYTHMIC FOCUS  04/25/2017  . ATRIAL FIBRILLATION ABLATION N/A 04/25/2017   Procedure: Atrial Fibrillation Ablation;  Surgeon: Constance Haw, MD;  Location: [facility];  Service: Cardiovascular;  Laterality: N/A;  . BREAST EXCISIONAL BIOPSY Bilateral    benign  . CATARACT EXTRACTION W/ INTRAOCULAR LENS  IMPLANT, BILATERAL    . CERVICAL LAMINECTOMY    . CESAREAN SECTION     X2   . LEGS      AGE 80-11   SEVERAL SURGERIES FOR POLIO   . SVT ABLATION N/A 11/26/2017   Procedure: SVT ABLATION;  Surgeon: Constance Haw, MD;  Location: [facility];  Service: Cardiovascular;  Laterality: N/A;  . THYROIDECTOMY       Current Outpatient Medications  Medication Sig Dispense Refill  . acyclovir (ZOVIRAX) 400 MG tablet Take 400 mg at bedtime by mouth.     Marland Kitchen alendronate (FOSAMAX) 70 MG tablet Take 70 mg by mouth every Thursday. Take with a full glass of water on an empty stomach.     Marland Kitchen apixaban (ELIQUIS) 5 MG TABS tablet Take 1 tablet (5 mg total) by mouth 2 (two) times daily. 180 tablet 3  . Ascorbic Acid (VITAMIN C) 1000 MG tablet Take 1,000 mg by mouth daily.    Marland Kitchen atenolol (TENORMIN) 50 MG tablet Take 1 tablet (50 mg total) by mouth 2 (two) times daily. 180 tablet 3  . Calcium Citrate-Vitamin D (CALCIUM CITRATE + D PO) Take 2 tablets by mouth at bedtime.     . chlorthalidone (HYGROTON) 25 MG tablet Take 1 tablet (25 mg total) daily by mouth. [place]  tablet 3  . Coenzyme Q10 (CO Q-10) 100 MG CAPS Take 100 mg by mouth daily.     Marland Kitchen D-Ribose (RIBOSE, D,) POWD Take 2 g by mouth daily.     . fexofenadine (ALLEGRA) 180 MG tablet Take 180 mg by mouth daily.    . fluticasone (FLONASE) 50 MCG/ACT nasal spray Place 2 sprays into both nostrils daily.    . Ginkgo Biloba 120 MG TABS Take 120 mg by mouth 2 (two) times daily.     Marland Kitchen iloperidone (FANAPT) 4 MG TABS tablet Take 1 mg by mouth at bedtime.     Marland Kitchen levothyroxine (SYNTHROID, LEVOTHROID) 100 MCG tablet Take 100 mcg by mouth daily before breakfast.     . Magnesium 200 MG TABS Take 200 mg by mouth every evening.    . Melatonin 3 MG TABS Take 3 mg by mouth at bedtime.    . Multiple Vitamin (MULTIVITAMIN)  capsule Take 1 capsule daily by mouth.     . Omega-3 Fatty Acids (FISH OIL BURP-LESS) 1000 MG CAPS Take 3,000 mg by mouth 2 (two) times daily.     Marland Kitchen omeprazole (PRILOSEC) 20 MG capsule Take 20 mg by mouth every morning.    Marland Kitchen oxybutynin (DITROPAN-XL) 10 MG 24 hr tablet Take 10 mg by mouth daily.     . rosuvastatin (CRESTOR) 10 MG tablet Take one tablet up to 4 times weekly , Sunday Monday, Wednesday and Friday - Oral (Patient taking differently: Take 10 mg by mouth See admin instructions. Take 10 mg by mouth daily on Sunday, Monday, Wednesday and Friday) 30 tablet 10  . sodium chloride (OCEAN) 0.65 % SOLN nasal spray Place 2 sprays into both nostrils every evening.    . temazepam (RESTORIL) 15 MG capsule Take 15 mg by mouth at bedtime.     . traMADol (ULTRAM) 50 MG tablet Take 1 tablet (50 mg total) by mouth 3 (three) times daily as needed. (Patient taking differently: Take 50 mg 3 (three) times daily as needed by mouth for moderate pain. ) 90 tablet 1  . ziprasidone (GEODON) 40 MG capsule Take 40 mg by mouth at bedtime.      No current facility-administered medications for this visit.     Allergies:   Sulfa antibiotics; Ciprocinonide [fluocinolone]; and Ciprofloxacin   Social History:  The patient  reports that she has quit smoking. she has never used smokeless tobacco. She reports that she drinks alcohol. She reports that she does not use drugs.   Family History:  The patient's family history includes Atrial fibrillation in her brother; COPD in her brother; Cancer in her father; Hypertension in her mother.   ROS:  Please see the history of present illness.   Otherwise, review of systems is positive for chest pressure, palpitations, anxiety, dizziness, headaches, easy bruising.   All other systems are reviewed and negative.   PHYSICAL EXAM: VS:  LMP  (LMP Unknown)  , BMI There is no height or weight on file to calculate BMI. GEN: Well nourished, well developed, in no acute distress  HEENT:  normal  Neck: no JVD, carotid bruits, or masses Cardiac: RRR; no murmurs, rubs, or gallops,no edema  Respiratory:  clear to auscultation bilaterally, normal work of breathing GI: soft, nontender, nondistended, + BS MS: no deformity or atrophy  Skin: warm and dry Neuro:  Strength and sensation are intact Psych: euthymic mood, full affect  EKG:  EKG is ordered today. Personal review of the ekg ordered shows sinus rhythm, rate  58, nonspecific T wave changes   Recent Labs: 04/02/2017: ALT 15 11/26/2017: BUN 17; Creatinine, Ser 0.99; Hemoglobin 12.5; Platelets 235; Potassium 3.6; Sodium 135    Lipid Panel     Component Value Date/Time   CHOL 136 02/12/2017 1013   TRIG 114 02/12/2017 1013   HDL 46 02/12/2017 1013   CHOLHDL 3.0 02/12/2017 1013   CHOLHDL 3.5 10/22/2016 1039   VLDL 19 10/22/2016 1039   LDLCALC 67 02/12/2017 1013     Wt Readings from Last 3 Encounters:  11/26/17 132 lb (59.9 kg)  11/05/17 132 lb 6.4 oz (60.1 kg)  09/24/17 132 lb (59.9 kg)      Other studies Reviewed: Additional studies/ records that were reviewed today include: TTE 2015  Review of the above records today demonstrates:  - Left ventricle: The cavity size was normal. Wall thickness was normal. Systolic function was normal. The estimated ejection fraction was in the range of 60% to 65%. Wall motion was normal; there were no regional wall motion abnormalities. - Left atrium: The atrium was mildly dilated. - Atrial septum: No defect or patent foramen ovale was identified. - Pulmonary arteries: PA peak pressure: 32 mm Hg (S).   ASSESSMENT AND PLAN:  1.  Paroxysmal atrial fibrillation: Currently on Eliquis.  Had an atrial fibrillation ablation 04/25/17.  No obvious recurrences.  This patients CHA2DS2-VASc Score and unadjusted Ischemic Stroke Rate (% per year) is equal to 7.2 % stroke rate/year from a score of 5  Above score calculated as 1 point each if present [CHF, HTN, DM,  Vascular=MI/PAD/Aortic Plaque, Age if 65-74, or Female] Above score calculated as 2 points each if present [Age > 75, or Stroke/TIA/TE]   2. Hypertension: Currently well controlled.  No medication changes   3. 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  She is continuing to have episodes of palpitations.  This makes her feel quite poorly.  We Meili Kleckley place a 30-day monitor to further determine the rhythm.  Current medicines are reviewed at length with the patient today.   The patient does not have concerns regarding her medicines.  The following changes were made today:  none  Labs/ tests ordered today include:  No orders of the defined types were placed in this encounter.    Disposition:   FU with Daneen Volcy 3 months  Signed, Caliegh Middlekauff Meredith Leeds, MD  12/30/2017 1:26 PM     [address] [place] [address] [phone] (office) [phone] (fax)

## 2017-12-30 NOTE — Patient Instructions (Signed)
Medication Instructions:  Your physician recommends that you continue on your current medications as directed. Please refer to the Current Medication list given to you today.  * If you need a refill on your cardiac medications before your next appointment, please call your pharmacy. *  Labwork: None ordered  Testing/Procedures: Your physician has recommended that you wear an event monitor. Event monitors are medical devices that record the heart's electrical activity. Doctors most often [place] these monitors to diagnose arrhythmias. Arrhythmias are problems with the speed or rhythm of the heartbeat. The monitor is a small, portable device. You can wear one while you do your normal daily activities. This is usually used to diagnose what is causing palpitations/syncope (passing out).  Follow-Up: Your physician recommends that you schedule a follow-up appointment in: 3 months with Dr. Curt Bears.  Thank you for choosing [facility]!!   Trinidad Curet, RN [phone]  Any Other Special Instructions Will Be Listed Below (If Applicable).   Cardiac Event Monitoring A cardiac event monitor is a small recording device that is used to detect abnormal heart rhythms (arrhythmias). The monitor is used to record your heart rhythm when you have symptoms, such as:  Fast heartbeats (palpitations), such as heart racing or fluttering.  Dizziness.  Fainting or light-headedness.  Unexplained weakness.  Some monitors are wired to electrodes placed on your chest. Electrodes are flat, sticky disks that attach to your skin. Other monitors may be hand-held or worn on the wrist. The monitor can be worn for up to 30 days. If the monitor is attached to your chest, a technician will prepare your chest for the electrode placement and show you how to work the monitor. Take time to practice using the monitor before you leave the office. Make sure you understand how to send the information from the monitor to your  health care provider. In some cases, you may need to use a landline telephone instead of a cell phone. What are the risks? Generally, this device is safe to use, but it possible that the skin under the electrodes will become irritated. How to use your cardiac event monitor  Wear your monitor at all times, except when you are in water: ? Do not let the monitor get wet. ? Take the monitor off when you bathe. Do not swim or use a hot tub with it on.  Keep your skin clean. Do not put body lotion or moisturizer on your chest.  Change the electrodes as told by your health care provider or any time they stop sticking to your skin. You may need to use medical tape to keep them on.  Try to put the electrodes in slightly different places on your chest to help prevent skin irritation. They must remain in the area under your left breast and in the upper right section of your chest.  Make sure the monitor is safely clipped to your clothing or in a location close to your body that your health care provider recommends.  Press the button to record as soon as you feel heart-related symptoms, such as: ? Dizziness. ? Weakness. ? Light-headedness. ? Palpitations. ? Thumping or pounding in your chest. ? Shortness of breath. ? Unexplained weakness.  Keep a diary of your activities, such as walking, doing chores, and taking medicine. It is very important to note what you were doing when you pushed the button to record your symptoms. This will help your health care provider determine what might be contributing to your symptoms.  Send the recorded information as recommended by your health care provider. It may take some time for your health care provider to process the results.  Change the batteries as told by your health care provider.  Keep electronic devices away from your monitor. This includes: ? Tablets. ? MP3 players. ? Cell phones.  While wearing your monitor you should avoid: ? Electric  blankets. ? Armed forces operational officer. ? Electric toothbrushes. ? Microwave ovens. ? Magnets. ? Metal detectors. Get help right away if:  You have chest pain.  You have extreme difficulty breathing or shortness of breath.  You develop a very fast heartbeat that persists.  You develop dizziness that does not go away.  You faint or constantly feel like you are about to faint. Summary  A cardiac event monitor is a small recording device that is used to help detect abnormal heart rhythms (arrhythmias).  The monitor is used to record your heart rhythm when you have heart-related symptoms.  Make sure you understand how to send the information from the monitor to your health care provider.  It is important to press the button on the monitor when you have any heart-related symptoms.  Keep a diary of your activities, such as walking, doing chores, and taking medicine. It is very important to note what you were doing when you pushed the button to record your symptoms. This will help your health care provider learn what might be causing your symptoms. This information is not intended to replace advice given to you by your health care provider. Make sure you discuss any questions you have with your health care provider. Document Released: 08/13/2008 Document Revised: 10/19/2016 Document Reviewed: 10/19/2016 Elsevier Interactive Patient Education  2017 [employer].

## 2018-01-08 ENCOUNTER — Ambulatory Visit (INDEPENDENT_AMBULATORY_CARE_PROVIDER_SITE_OTHER): Payer: PPO

## 2018-01-08 DIAGNOSIS — I471 Supraventricular tachycardia: Secondary | ICD-10-CM | POA: Diagnosis not present

## 2018-01-08 DIAGNOSIS — I480 Paroxysmal atrial fibrillation: Secondary | ICD-10-CM

## 2018-01-19 DIAGNOSIS — Z0000 Encounter for general adult medical examination without abnormal findings: Secondary | ICD-10-CM | POA: Diagnosis not present

## 2018-01-19 DIAGNOSIS — E785 Hyperlipidemia, unspecified: Secondary | ICD-10-CM | POA: Diagnosis not present

## 2018-01-19 DIAGNOSIS — I10 Essential (primary) hypertension: Secondary | ICD-10-CM | POA: Diagnosis not present

## 2018-01-19 DIAGNOSIS — E039 Hypothyroidism, unspecified: Secondary | ICD-10-CM | POA: Diagnosis not present

## 2018-01-24 IMAGING — MG DIGITAL SCREENING BILATERAL MAMMOGRAM WITH CAD
4 series · 4 of 4 positions shown · non-contrast
Comparison: Previous exam(s).

CLINICAL DATA: Screening.

EXAM:
DIGITAL SCREENING BILATERAL MAMMOGRAM WITH CAD

[R MLO]
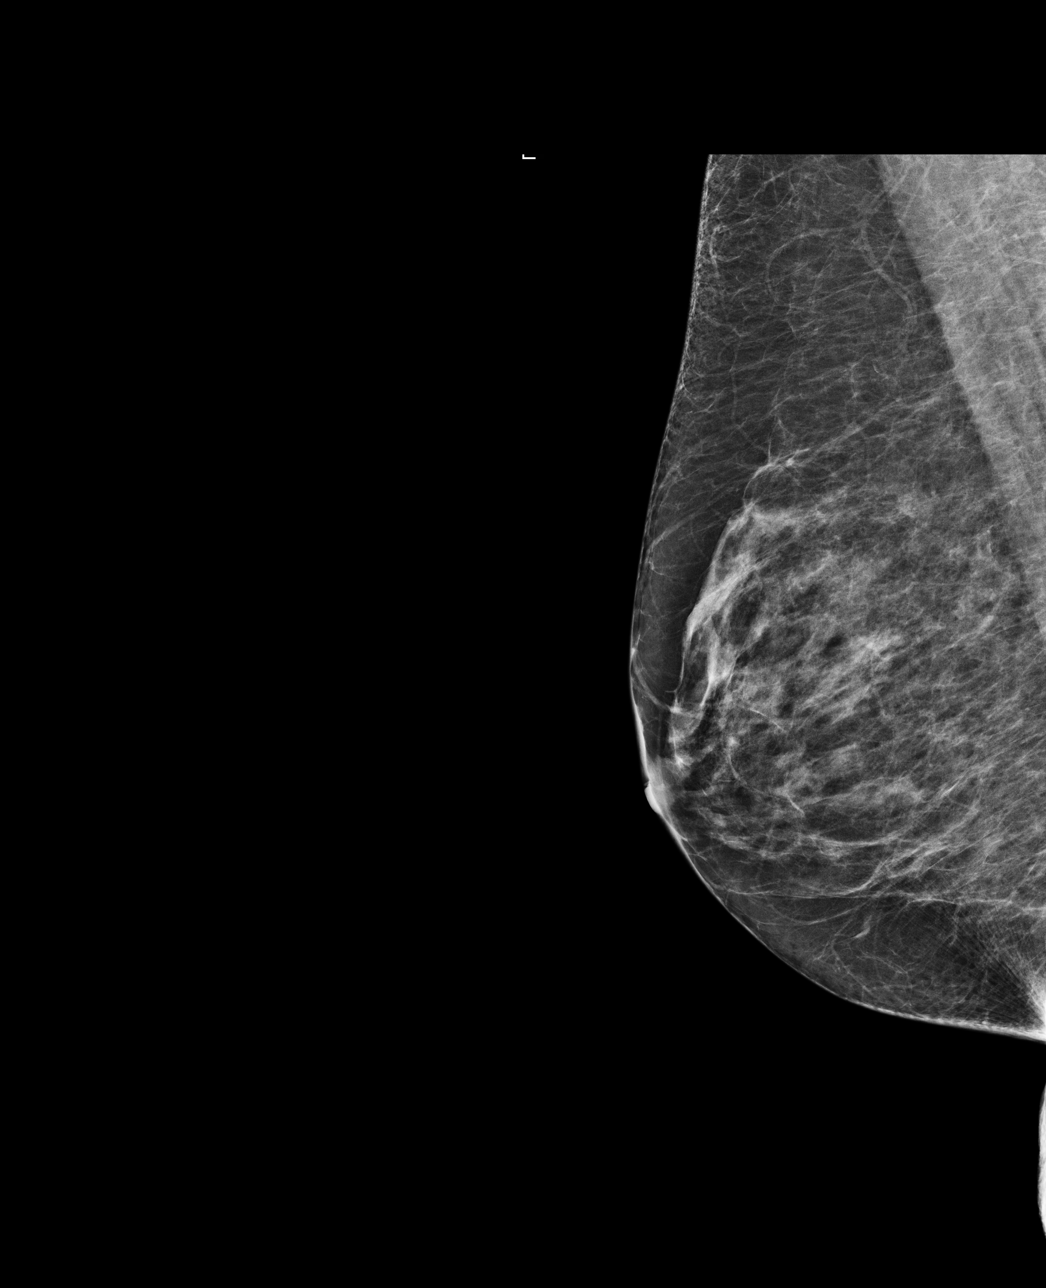

[L MLO]
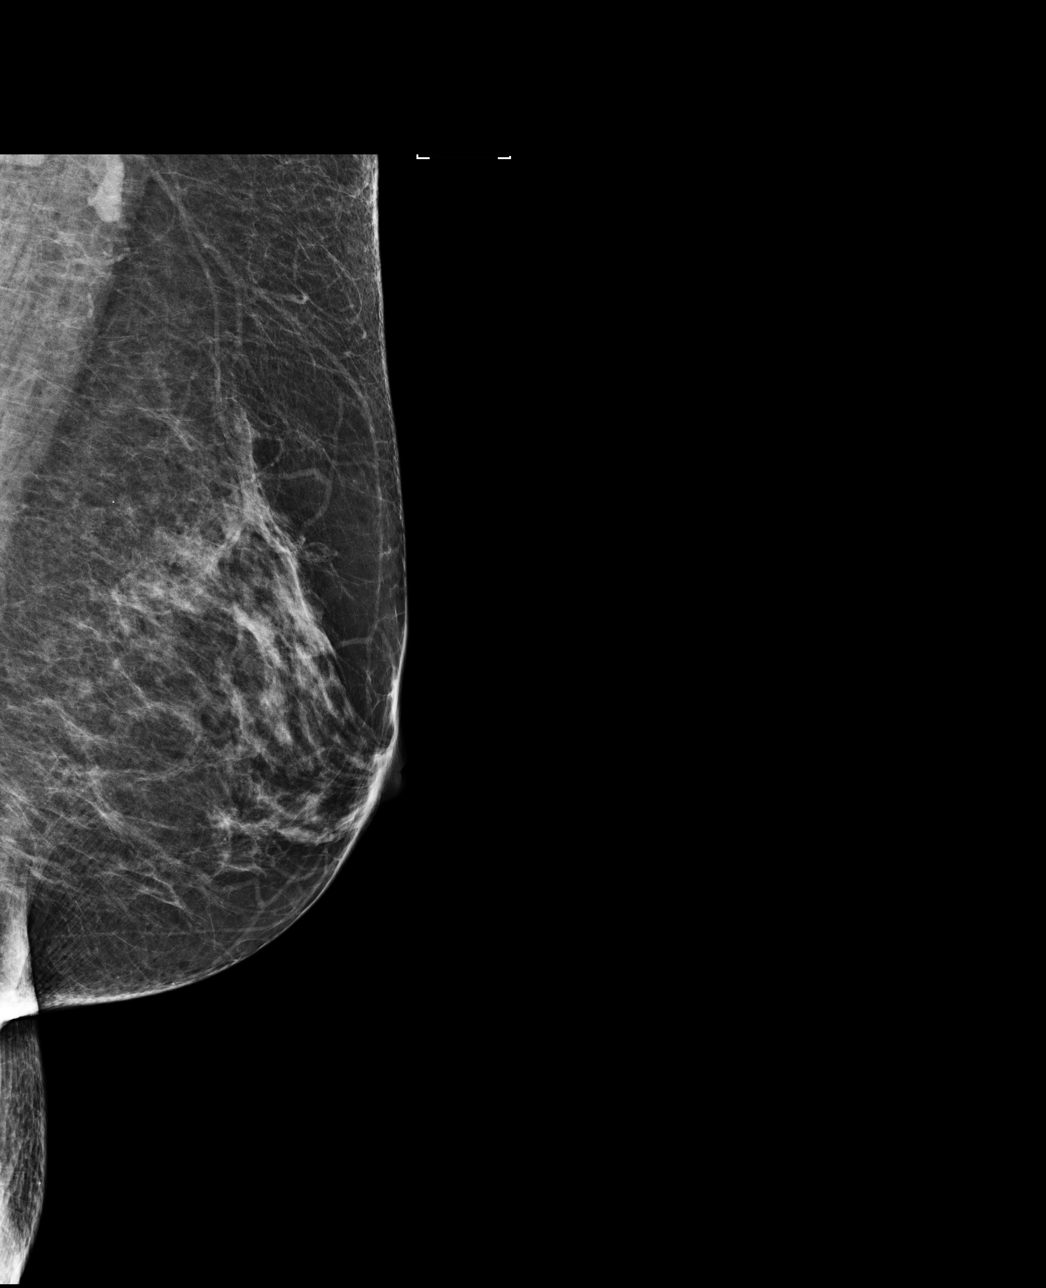

[L CC]
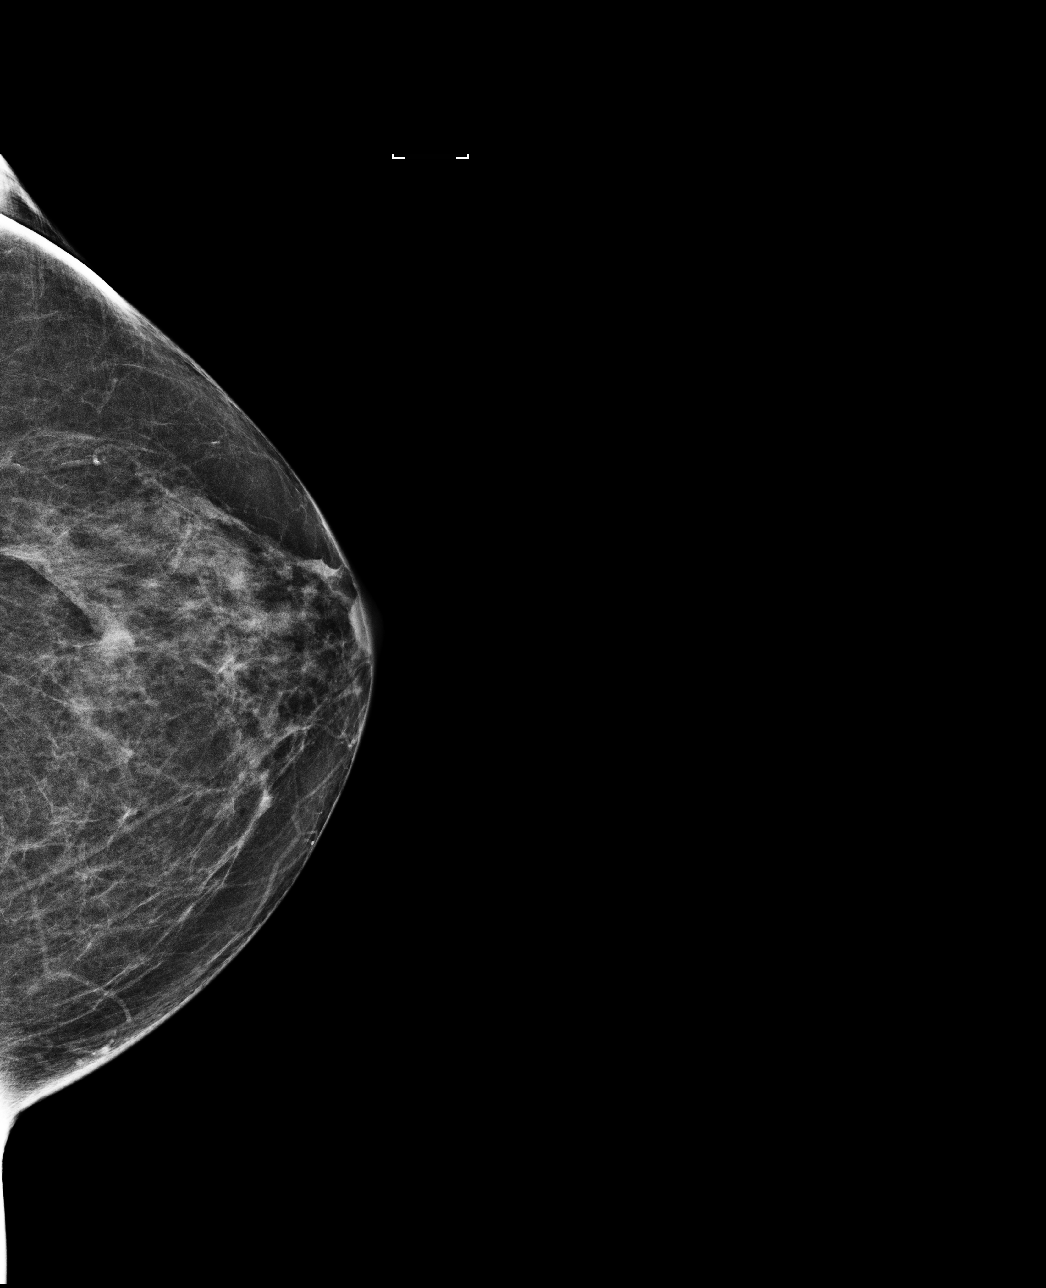

[R CC]
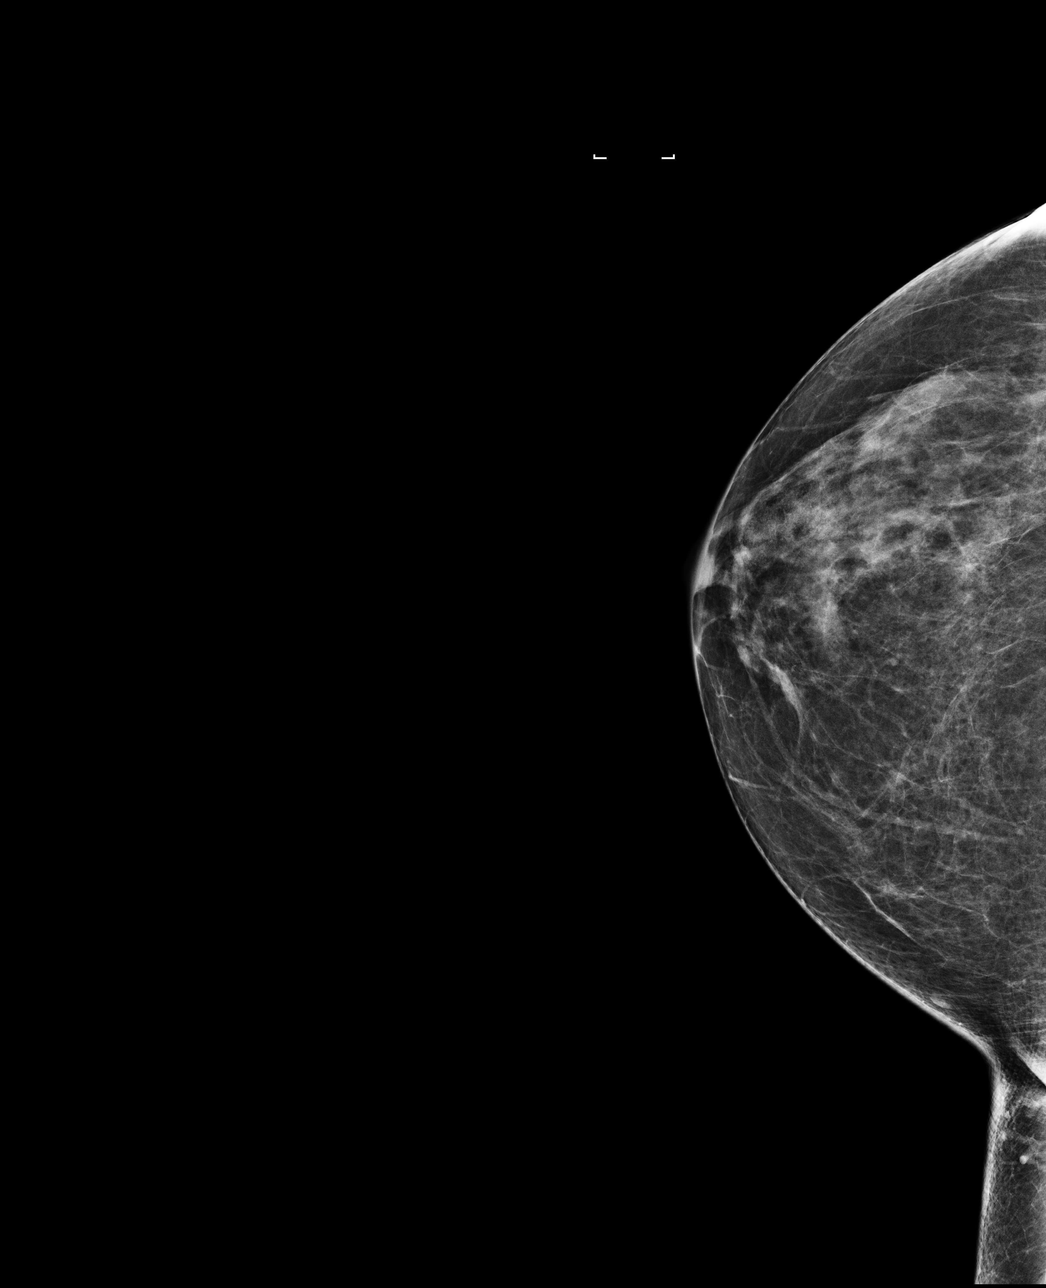

[4 of 4 positions shown; findings below may reference images not displayed]

ACR Breast Density Category b: There are scattered areas of
fibroglandular density.
FINDINGS: There are no findings suspicious for malignancy. Images were
processed with CAD.
IMPRESSION: No mammographic evidence of malignancy. A result letter of this
screening mammogram will be mailed directly to the patient.

RECOMMENDATION:
Screening mammogram in one year. (Code:AS-G-LCT)

BI-RADS CATEGORY  1: Negative.

## 2018-02-04 ENCOUNTER — Telehealth: Payer: Self-pay | Admitting: Cardiology

## 2018-02-04 NOTE — Telephone Encounter (Signed)
Pt aware and will continue to monitor BP.  She will c/b if BP remains elevated.

## 2018-02-04 NOTE — Telephone Encounter (Signed)
New message   Pt c/o BP issue: STAT if pt c/o blurred vision, one-sided weakness or slurred speech  1. What are your last 5 BP readings? 172/84 last night, this am 116/56   Does not have a date when reading was taken - 168/81   evening before 162/70  2. Are you having any other symptoms (ex. Dizziness, headache, blurred vision, passed out)? No   3. What is your BP issue? Discuss with nurse

## 2018-02-04 NOTE — Telephone Encounter (Signed)
Continue to monitor.  Jewels Langone, MD  

## 2018-02-04 NOTE — Telephone Encounter (Signed)
Spoke with patient who is reporting she has been having an increase in headaches recently (appr 2 weeks).  A few days ago she decided to check her BP and has found it seems to be her normal in the AM but becomes elevated in the late afternoon and high into the evening.  Last night it was 172/80.  She is not sure how long her BP has been being elevated in the evenings. She is going to continue to check it and keep a diary.  She is still taking Atenolol 50 mg BID and Chlorthalidone 25 mg every AM.  Advised I will forward this information to Dr Marlou Porch for review and will c/b to follow up.

## 2018-02-17 DIAGNOSIS — F28 Other psychotic disorder not due to a substance or known physiological condition: Secondary | ICD-10-CM | POA: Diagnosis not present

## 2018-04-01 ENCOUNTER — Other Ambulatory Visit: Payer: Self-pay

## 2018-04-01 ENCOUNTER — Emergency Department (HOSPITAL_COMMUNITY)
Admission: EM | Admit: 2018-04-01 | Discharge: 2018-04-01 | Disposition: A | Discharge status: Home / Self Care | Payer: PPO | Attending: Emergency Medicine | Admitting: Emergency Medicine

## 2018-04-01 ENCOUNTER — Encounter (HOSPITAL_COMMUNITY): Payer: Self-pay | Admitting: Emergency Medicine

## 2018-04-01 ENCOUNTER — Telehealth: Payer: Self-pay | Admitting: Cardiology

## 2018-04-01 ENCOUNTER — Emergency Department (HOSPITAL_COMMUNITY): Payer: PPO

## 2018-04-01 DIAGNOSIS — I10 Essential (primary) hypertension: Secondary | ICD-10-CM | POA: Insufficient documentation

## 2018-04-01 DIAGNOSIS — R0789 Other chest pain: Secondary | ICD-10-CM | POA: Diagnosis not present

## 2018-04-01 DIAGNOSIS — Z79899 Other long term (current) drug therapy: Secondary | ICD-10-CM | POA: Insufficient documentation

## 2018-04-01 DIAGNOSIS — R079 Chest pain, unspecified: Secondary | ICD-10-CM

## 2018-04-01 DIAGNOSIS — E039 Hypothyroidism, unspecified: Secondary | ICD-10-CM | POA: Diagnosis not present

## 2018-04-01 DIAGNOSIS — Z87891 Personal history of nicotine dependence: Secondary | ICD-10-CM | POA: Diagnosis not present

## 2018-04-01 DIAGNOSIS — R5383 Other fatigue: Secondary | ICD-10-CM | POA: Diagnosis not present

## 2018-04-01 LAB — BASIC METABOLIC PANEL
ANION GAP: 10 (ref 5–15)
BUN: 20 mg/dL (ref 6–20)
CALCIUM: 8.7 mg/dL — AB (ref 8.9–10.3)
CO2: 28 mmol/L (ref 22–32)
Chloride: 100 mmol/L — ABNORMAL LOW (ref 101–111)
Creatinine, Ser: 1.13 mg/dL — ABNORMAL HIGH (ref 0.44–1.00)
GFR calc Af Amer: 53 mL/min — ABNORMAL LOW (ref >60)
GFR, EST NON AFRICAN AMERICAN: 46 mL/min — AB (ref >60)
GLUCOSE: 108 mg/dL — AB (ref 65–99)
Potassium: 3.7 mmol/L (ref 3.5–5.1)
Sodium: 138 mmol/L (ref 135–145)

## 2018-04-01 LAB — CBC
HCT: 36.7 % (ref 36.0–46.0)
HEMOGLOBIN: 12.4 g/dL (ref 12.0–15.0)
MCH: 31.5 pg (ref 26.0–34.0)
MCHC: 33.8 g/dL (ref 30.0–36.0)
MCV: 93.1 fL (ref 78.0–100.0)
PLATELETS: 220 10*3/uL (ref 150–400)
RBC: 3.94 MIL/uL (ref 3.87–5.11)
RDW: 13.1 % (ref 11.5–15.5)
WBC: 5.7 10*3/uL (ref 4.0–10.5)

## 2018-04-01 LAB — I-STAT TROPONIN, ED
TROPONIN I, POC: 0.01 ng/mL (ref 0.00–0.08)
Troponin i, poc: 0.01 ng/mL (ref 0.00–0.08)

## 2018-04-01 NOTE — ED Provider Notes (Signed)
[facility] EMERGENCY DEPARTMENT Provider Note   CSN: [id number] Arrival date & time: 04/01/18  1733     History   Chief Complaint Chief Complaint  Patient presents with  . Chest Pain    HPI Kaitlyn Hoover is a 77 y.o. female.  HPI Patient presented to the emergency room for evaluation of chest discomfort.  Patient states she had an episode of soreness in the left side of her chest last evening.  Patient is very clear that it was not a pain but a soreness.  EMS came to evaluate the patient.  She had EKGs and they recommended she come to the emergency room for evaluation.  Patient refused transport.  She went back to bed.  Throughout most of the day she continued to have soreness in her chest and felt fatigued I did come in to be evaluated.  As the evening has progressed the patient states her symptoms have all resolved.  She is not feeling short of breath or having any chest discomfort now.  She denies any leg swelling.  No complaints of fevers or coughing.  Patient does not have a history of coronary artery disease.  Denies any history of PE or DVT.  No family history of heart disease.  Patient does not smoke. Past Medical History:  Diagnosis Date  . Anxiety   . Arthritis   . Atrial fibrillation with RVR ([place])   . Bipolar disorder ([place])   . Dysrhythmia   . GERD (gastroesophageal reflux disease)   . Hypertension   . Hypothyroidism   . Palpitations   . PAT (paroxysmal atrial tachycardia) ([place])   . Post-polio syndrome   . Sinus infection   . 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 Atrial fibrillation ([place]) 04/25/2017  . Hyperlipidemia 03/11/2017  . Surgery, elective 08/02/2016  . Spondylolisthesis, lumbar region 07/31/2016  . Hyperlipemia 04/12/2016  . Gait disturbance 01/26/2016  . Spinal stenosis of lumbar region 01/26/2016  . Progressive focal motor weakness 01/12/2016  . Muscle atrophy 01/12/2016    . Urinary urgency 01/12/2016  . Encounter for long-term (current) use of high-risk medication 03/03/2015  . Essential hypertension 03/03/2015  . Atrial fibrillation with RVR ([place]) 04/18/2014  . Palpitations 04/18/2014  . 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 Hypertension   . 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 ABDOMINAL HYSTERECTOMY    . ABLATION OF DYSRHYTHMIC FOCUS  04/25/2017  . ATRIAL FIBRILLATION ABLATION N/A 04/25/2017   Procedure: Atrial Fibrillation Ablation;  Surgeon: Constance Haw, MD;  Location: [facility];  Service: Cardiovascular;  Laterality: N/A;  . BREAST EXCISIONAL BIOPSY Bilateral    benign  . CATARACT EXTRACTION W/ INTRAOCULAR LENS  IMPLANT, BILATERAL    . CERVICAL LAMINECTOMY    . CESAREAN SECTION     X2   . LEGS      AGE 64-11   SEVERAL SURGERIES FOR POLIO   . SVT ABLATION N/A 11/26/2017   Procedure: SVT ABLATION;  Surgeon: Constance Haw, MD;  Location: [facility];  Service: Cardiovascular;  Laterality: N/A;  . THYROIDECTOMY       OB History   None      Home Medications    Prior to Admission medications   Medication Sig Start Date End Date Taking? Authorizing Provider  acyclovir (ZOVIRAX) 400 MG tablet Take  400 mg at bedtime by mouth.  02/05/14  Yes [provider]  alendronate (FOSAMAX) 70 MG tablet Take 70 mg by mouth every Sunday. Take with a full glass of water on an empty stomach.    Yes [provider]  apixaban (ELIQUIS) 5 MG TABS tablet Take 1 tablet (5 mg total) by mouth 2 (two) times daily. 06/03/17  Yes Jerline Pain, MD  Ascorbic Acid (VITAMIN C) 1000 MG tablet Take 1,000 mg by mouth daily.   Yes [provider]  atenolol (TENORMIN) 50 MG tablet Take 1 tablet (50 mg total) by mouth 2 (two) times daily. 05/15/17 05/15/18 Yes Jerline Pain, MD  Calcium Citrate-Vitamin D (CALCIUM CITRATE + D PO) Take 2 tablets by mouth at bedtime.    Yes  [provider]  chlorthalidone (HYGROTON) 25 MG tablet Take 1 tablet (25 mg total) daily by mouth. 09/26/17 04/01/18 Yes Jerline Pain, MD  Coenzyme Q10 (CO Q-10) 100 MG CAPS Take 100 mg by mouth daily.    Yes [provider]  D-Ribose (RIBOSE, D,) POWD Take 2 g by mouth daily.    Yes [provider]  fexofenadine (ALLEGRA) 180 MG tablet Take 180 mg by mouth daily.   Yes [provider]  fluticasone (FLONASE) 50 MCG/ACT nasal spray Place 2 sprays into both nostrils daily.   Yes [provider]  Ginkgo Biloba 120 MG TABS Take 120 mg by mouth 2 (two) times daily.    Yes [provider]  iloperidone (FANAPT) 4 MG TABS tablet Take 1 mg by mouth at bedtime.    Yes [provider]  levothyroxine (SYNTHROID, LEVOTHROID) 100 MCG tablet Take 100 mcg by mouth daily before breakfast.  02/16/14  Yes [provider]  Magnesium 200 MG TABS Take 200 mg by mouth every evening.   Yes [provider]  Melatonin 3 MG TABS Take 3 mg by mouth at bedtime.   Yes [provider]  Multiple Vitamin (MULTIVITAMIN) capsule Take 1 capsule daily by mouth.    Yes [provider]  Omega-3 Fatty Acids (FISH OIL BURP-LESS) 1000 MG CAPS Take 3,000 mg by mouth 2 (two) times daily.    Yes [provider]  omeprazole (PRILOSEC) 20 MG capsule Take 20 mg by mouth every morning.   Yes [provider]  oxybutynin (DITROPAN-XL) 10 MG 24 hr tablet Take 10 mg by mouth daily.    Yes [provider]  Pyridoxine HCl (VITAMIN B-6) 500 MG tablet Take 500 mg by mouth 2 (two) times daily.   Yes [provider]  rosuvastatin (CRESTOR) 10 MG tablet Take one tablet up to 4 times weekly , Sunday Monday, Wednesday and Friday - Oral Patient taking differently: Take 10 mg by mouth See admin instructions. Take 10 mg by mouth daily on Sunday, Monday, Wednesday and Friday 10/30/17  Yes Skains, Thana Farr, MD  sodium chloride (OCEAN)  0.65 % SOLN nasal spray Place 2 sprays into both nostrils every evening.   Yes [provider]  temazepam (RESTORIL) 15 MG capsule Take 15 mg by mouth at bedtime.  06/01/16  Yes [provider]  traMADol (ULTRAM) 50 MG tablet Take 1 tablet (50 mg total) by mouth 3 (three) times daily as needed. Patient taking differently: Take 50 mg 3 (three) times daily as needed by mouth for moderate pain.  09/09/17  Yes Sater, Nanine Means, MD  ziprasidone (GEODON) 40 MG capsule Take 40 mg by mouth at bedtime.  Yes [provider]    Family History Family History  Problem Relation Age of Onset  . Hypertension Mother   . Cancer Father        JAW BONE  . COPD Brother   . Atrial fibrillation Brother     Social History Social History   Tobacco Use  . Smoking status: Former Research scientist (life sciences)  . Smokeless tobacco: Never Used  . Tobacco comment: QUIT SMOKING IN 1998  Substance Use Topics  . Alcohol use: Yes    Comment: GLASS WINE W/ DINNER  . Drug use: No     Allergies   Sulfa antibiotics; Ciprocinonide [fluocinolone]; and Ciprofloxacin   Review of Systems Review of Systems  All other systems reviewed and are negative.    Physical Exam Updated Vital Signs BP (!) 149/74 (BP Location: Right Arm)   Pulse (!) 36   Temp 98.2 F (36.8 C) (Oral)   Resp 14   Ht 1.575 m (5\' 2" )   Wt 59.9 kg (132 lb)   LMP  (LMP Unknown)   SpO2 100%   BMI 24.14 kg/m   Physical Exam  Constitutional: She appears well-developed and well-nourished. No distress.  HENT:  Head: Normocephalic and atraumatic.  Right Ear: External ear normal.  Left Ear: External ear normal.  Eyes: Conjunctivae are normal. Right eye exhibits no discharge. Left eye exhibits no discharge. No scleral icterus.  Neck: Neck supple. No tracheal deviation present.  Cardiovascular: Normal rate, regular rhythm and intact distal pulses.  Pulmonary/Chest: Effort normal and breath sounds normal. No stridor. No respiratory  distress. She has no wheezes. She has no rales.  Abdominal: Soft. Bowel sounds are normal. She exhibits no distension. There is no tenderness. There is no rebound and no guarding.  Musculoskeletal: She exhibits no edema or tenderness.  Atrophy right lower extremity  Neurological: She is alert. She has normal strength. No cranial nerve deficit (no facial droop, extraocular movements intact, no slurred speech) or sensory deficit. She exhibits normal muscle tone. She displays no seizure activity. Coordination normal.  Skin: Skin is warm and dry. No rash noted.  Psychiatric: She has a normal mood and affect.  Nursing note and vitals reviewed.    ED Treatments / Results  Labs (all labs ordered are listed, but only abnormal results are displayed) Labs Reviewed  BASIC METABOLIC PANEL - Abnormal; Notable for the following components:      Result Value   Chloride 100 (*)    Glucose, Bld 108 (*)    Creatinine, Ser 1.13 (*)    Calcium 8.7 (*)    GFR calc non Af Amer 46 (*)    GFR calc Af Amer 53 (*)    All other components within normal limits  CBC  I-STAT TROPONIN, ED  I-STAT TROPONIN, ED    EKG EKG Interpretation  Date/Time:  Wednesday Apr 01 2018 17:44:09 EDT Ventricular Rate:  63 PR Interval:    QRS Duration: 72 QT Interval:  434 QTC Calculation: 444 R Axis:   62 Text Interpretation:  sinus rhythm with PACs Nonspecific ST and T wave abnormality , probably digitalis effect Abnormal ECG No significant change since last tracing Confirmed by Dorie Rank [phone]) on 04/01/2018 10:02:25 PM   Radiology Dg Chest 2 View  Result Date: 04/01/2018 CLINICAL DATA:  Chest pain since last night with tightness. EXAM: CHEST - 2 VIEW COMPARISON:  CT chest June 3, CT chest April 21, 2017 FINDINGS: Cardiomediastinal silhouette is normal. Calcified aortic arch. No pleural effusions  or focal consolidations. Trachea projects midline and there is no pneumothorax. Soft tissue planes and included osseous  structures are non-suspicious. IMPRESSION: No active cardiopulmonary disease. Aortic Atherosclerosis (ICD10-I70.0). Electronically Signed   By: Elon Alas M.D.   On: 04/01/2018 19:12    Procedures Procedures (including critical care time)  Medications Ordered in ED Medications - No data to display   Initial Impression / Assessment and Plan / ED Course  I have reviewed the triage vital signs and the nursing notes.  Pertinent labs & imaging results that were available during my care of the patient were reviewed by me and considered in my medical decision making (see chart for details).  Clinical Course as of Apr 01 2346  Wed Apr 01, 2018  2205 Moderate risk per heart score.  4   [JK]    Clinical Course User Index [JK] Dorie Rank, MD    Patient presented to the emergency room for evaluation of chest pain.  Per her heart score she is at moderate risk however the patient's symptoms have been ongoing for a day.  She has 2 sets of normal cardiac enzymes.  Patient does not have any history of heart disease.  She has scheduled follow-up with her cardiologist.  She feels comfortable and would prefer to go home.  I think this is reasonable and I have a low suspicion for acute coronary syndrome or other emergent etiology.  Final Clinical Impressions(s) / ED Diagnoses   Final diagnoses:  Chest pain, unspecified type    ED Discharge Orders    None       Dorie Rank, MD 04/01/18 2347

## 2018-04-01 NOTE — Discharge Instructions (Addendum)
Follow up with your cardiologist as planned, return to the ED for recurrent symptoms

## 2018-04-01 NOTE — Telephone Encounter (Signed)
New message    Patient calling to report her heart was out on rhythm on 5/14. EMS called, EKG done, patient declined to go to hospital. Patient states she feels better today. Patient wants to know if Dr Curt Bears wants to see EKG prior to her appt 5/21.

## 2018-04-01 NOTE — ED Triage Notes (Signed)
Pt states last night began having "soreness" to left side of chest. Pt states EMS came and she refused transport. The soreness had gotten worse and she has felt tired all. Pt endorses shortness of breath. Dizziness last night but none today.

## 2018-04-01 NOTE — Telephone Encounter (Signed)
Pt states that yesterday evening her heart was out of rhythm. Pt called the EMS they did an EKG, and checked BP. Pt's BP was 721 systolic, and she felt a little SOB. Pt refused to go to the ER, because she states that she has been dealing with this rhythm for sometime, and this is the second time it happened this month. Pt took a 60 mg diltiazem the Dr gave her for when she was into A-fib prior her cardioversion. Pt states she went to bed after taken the diltiazem. The palpitations stop 5 hours after started. ( from 6 to 11 PM) . Pt states she feels better today. She wants to know if she needs to bring the EKG prior her O/V on 5/21 or she needs to bring it sooner. Pt is aware this message will be sent to MD for recommendations.

## 2018-04-02 NOTE — Telephone Encounter (Signed)
Informed patient that I could not find anything in EMS records.  Advised patient to bring EKG recordings with her to appt on Tuesday w/ Dr. Curt Bears. Patient verbalized understanding and agreeable to plan.

## 2018-04-06 ENCOUNTER — Telehealth: Payer: Self-pay | Admitting: Internal Medicine

## 2018-04-06 NOTE — Telephone Encounter (Signed)
H/o ablation , AFIB PT  HAS APPT  AT  2:30 5/21 EKG- OF recent EMR  Shows  AFIB.  PT  TOOK atenolol,  HR is in afib/70's  Just wanted to notify  If  afib rvr >100 bpm  Or  So , will come to ER, if  Not  Will come to DR  [facility]

## 2018-04-07 ENCOUNTER — Encounter: Payer: Self-pay | Admitting: Cardiology

## 2018-04-07 ENCOUNTER — Ambulatory Visit: Payer: PPO | Admitting: Cardiology

## 2018-04-07 VITALS — BP 122/64 | HR 60 | Ht 62.0 in | Wt 134.8 lb

## 2018-04-07 DIAGNOSIS — I10 Essential (primary) hypertension: Secondary | ICD-10-CM | POA: Diagnosis not present

## 2018-04-07 DIAGNOSIS — I491 Atrial premature depolarization: Secondary | ICD-10-CM

## 2018-04-07 DIAGNOSIS — I471 Supraventricular tachycardia: Secondary | ICD-10-CM | POA: Diagnosis not present

## 2018-04-07 DIAGNOSIS — I480 Paroxysmal atrial fibrillation: Secondary | ICD-10-CM

## 2018-04-07 DIAGNOSIS — E785 Hyperlipidemia, unspecified: Secondary | ICD-10-CM | POA: Diagnosis not present

## 2018-04-07 MED ORDER — DILTIAZEM HCL ER COATED BEADS 240 MG PO CP24: 240.0000 mg | ORAL_CAPSULE | Freq: Every day | ORAL | 3 refills | Status: DC

## 2018-04-07 NOTE — Progress Notes (Signed)
Electrophysiology Office Note   Date:  04/07/2018   ID:  AVIKA CARBINE, DOB 1941-01-18, MRN [id number]  PCP:  [name], MD  Cardiologist:  Marlou Porch Primary Electrophysiologist:  Will Meredith Leeds, MD    Chief Complaint  Patient presents with  . Follow-up    AN node re-entry tachycardia/SVT/PAF     History of Present Illness: Kaitlyn Hoover is a 77 y.o. female who is being seen today for the evaluation of SVT, atrial fibrillation at the request of [name], MD. Presenting today for electrophysiology evaluation. She had multiple episodes of ER visits due to atrial fibrillation. She was initially put on diltiazem 180 mg which has improved her symptoms. Her episodes of SVT have been converted with Valsalva. She had AF ablation 04/25/17 and is presenting for follow up. 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  Today, denies symptoms of , chest pain, shortness of breath, orthopnea, PND, lower extremity edema, claudication, dizziness, presyncope, syncope, bleeding, or neurologic sequela. The patient is tolerating medications without difficulties.  She had called EMS due to palpitations.  EKGs from EMS show sinus rhythm with PACs.  She can feel her PACs.  This is very different from her atrial fibrillation.  She does have some fatigue and weakness.   Past Medical History:  Diagnosis Date  . Anxiety   . Arthritis   . Atrial fibrillation with RVR ([place])   . Bipolar disorder ([place])   . Dysrhythmia   . GERD (gastroesophageal reflux disease)   . Hypertension   . Hypothyroidism   . Palpitations   . PAT (paroxysmal atrial tachycardia) ([place])   . Post-polio syndrome   . Sinus infection   . 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 ABDOMINAL HYSTERECTOMY    . ABLATION OF DYSRHYTHMIC FOCUS  04/25/2017  . ATRIAL FIBRILLATION ABLATION N/A 04/25/2017   Procedure: Atrial Fibrillation Ablation;  Surgeon: Constance Haw, MD;  Location: [facility];  Service: Cardiovascular;  Laterality: N/A;  . BREAST EXCISIONAL BIOPSY Bilateral    benign  . CATARACT EXTRACTION W/ INTRAOCULAR LENS  IMPLANT, BILATERAL    . CERVICAL LAMINECTOMY    . CESAREAN SECTION     X2   . LEGS      AGE 32-11   SEVERAL SURGERIES FOR POLIO   . SVT ABLATION N/A 11/26/2017   Procedure: SVT ABLATION;  Surgeon: Constance Haw, MD;  Location: [facility];  Service: Cardiovascular;  Laterality: N/A;  . THYROIDECTOMY       Current Outpatient Medications  Medication Sig Dispense Refill  . acyclovir (ZOVIRAX) 400 MG tablet Take 400 mg at bedtime by mouth.     Marland Kitchen alendronate (FOSAMAX) 70 MG tablet Take 70 mg by mouth every Sunday. Take with a full glass of water on an empty stomach.     Marland Kitchen apixaban (ELIQUIS) 5 MG TABS tablet Take 1 tablet (5 mg total) by mouth 2 (two) times daily. 180 tablet 3  . Ascorbic Acid (VITAMIN C) 1000 MG tablet Take 1,000 mg by mouth daily.    Marland Kitchen atenolol (TENORMIN) 50 MG tablet Take 1 tablet (50 mg total) by mouth 2 (two) times daily. 180 tablet 3  . azelastine (OPTIVAR) 0.05 % ophthalmic solution Place 2 drops into both eyes daily as needed. Inching eyes    . Calcium Citrate-Vitamin D (CALCIUM CITRATE + D PO) Take 2 tablets by mouth at bedtime.     Marland Kitchen  Coenzyme Q10 (CO Q-10) 100 MG CAPS Take 100 mg by mouth daily.     Marland Kitchen D-Ribose (RIBOSE, D,) POWD Take 2 g by mouth daily.     . fexofenadine (ALLEGRA) 180 MG tablet Take 180 mg by mouth daily.    . fluticasone (FLONASE) 50 MCG/ACT nasal spray Place 2 sprays into both nostrils daily.    . Ginkgo Biloba 120 MG TABS Take 120 mg by mouth 2 (two) times daily.     Marland Kitchen iloperidone (FANAPT) 4 MG TABS tablet Take 1 mg by mouth at bedtime.     Marland Kitchen levothyroxine (SYNTHROID, LEVOTHROID) 100 MCG tablet Take 100 mcg by mouth daily before breakfast.     . Magnesium 200 MG TABS Take 200 mg by mouth every evening.    . Melatonin 3 MG TABS Take 3 mg by mouth at bedtime.    . Multiple Vitamin (MULTIVITAMIN)  capsule Take 1 capsule daily by mouth.     . Omega-3 Fatty Acids (FISH OIL BURP-LESS) 1000 MG CAPS Take 3,000 mg by mouth 2 (two) times daily.     Marland Kitchen omeprazole (PRILOSEC) 20 MG capsule Take 20 mg by mouth every morning.    Marland Kitchen oxybutynin (DITROPAN-XL) 10 MG 24 hr tablet Take 10 mg by mouth daily.     . Pyridoxine HCl (VITAMIN B-6) 500 MG tablet Take 500 mg by mouth 2 (two) times daily.    . rosuvastatin (CRESTOR) 10 MG tablet Take one tablet up to 4 times weekly , Sunday Monday, Wednesday and Friday - Oral (Patient taking differently: Take 10 mg by mouth See admin instructions. Take 10 mg by mouth daily on Sunday, Monday, Wednesday and Friday) 30 tablet 10  . sodium chloride (OCEAN) 0.65 % SOLN nasal spray Place 2 sprays into both nostrils every evening.    . temazepam (RESTORIL) 15 MG capsule Take 15 mg by mouth at bedtime.     . traMADol (ULTRAM) 50 MG tablet Take 1 tablet (50 mg total) by mouth 3 (three) times daily as needed. (Patient taking differently: Take 50 mg 3 (three) times daily as needed by mouth for moderate pain. ) 90 tablet 1  . ziprasidone (GEODON) 40 MG capsule Take 40 mg by mouth at bedtime.     . chlorthalidone (HYGROTON) 25 MG tablet Take 1 tablet (25 mg total) daily by mouth. 90 tablet 3   No current facility-administered medications for this visit.     Allergies:   Sulfa antibiotics; Ciprocinonide [fluocinolone]; and Ciprofloxacin   Social History:  The patient  reports that she has quit smoking. She has never used smokeless tobacco. She reports that she drinks alcohol. She reports that she does not use drugs.   Family History:  The patient's family history includes Atrial fibrillation in her brother; COPD in her brother; Cancer in her father; Hypertension in her mother.   ROS:  Please see the history of present illness.   Otherwise, review of systems is positive for palpitations.   All other systems are reviewed and negative.   PHYSICAL EXAM: VS:  BP 122/64   Pulse 60    Ht 5\' 2"  (1.575 m)   Wt 134 lb 12.8 oz (61.1 kg)   LMP  (LMP Unknown)   SpO2 97%   BMI 24.66 kg/m  , BMI Body mass index is 24.66 kg/m. GEN: Well nourished, well developed, in no acute distress  HEENT: normal  Neck: no JVD, carotid bruits, or masses Cardiac: iRRR; no murmurs, rubs, or gallops,no edema  Respiratory:  clear to auscultation bilaterally, normal work of breathing GI: soft, nontender, nondistended, + BS MS: no deformity or atrophy  Skin: warm and dry Neuro:  Strength and sensation are intact Psych: euthymic mood, full affect  EKG:  EKG is not ordered today. Personal review of the ekg ordered 04/02/18 shows SR with PACs  Recent Labs: 04/01/2018: BUN 20; Creatinine, Ser 1.13; Hemoglobin 12.4; Platelets 220; Potassium 3.7; Sodium 138    Lipid Panel     Component Value Date/Time   CHOL 136 02/12/2017 1013   TRIG 114 02/12/2017 1013   HDL 46 02/12/2017 1013   CHOLHDL 3.0 02/12/2017 1013   CHOLHDL 3.5 10/22/2016 1039   VLDL 19 10/22/2016 1039   LDLCALC 67 02/12/2017 1013     Wt Readings from Last 3 Encounters:  04/07/18 134 lb 12.8 oz (61.1 kg)  04/01/18 132 lb (59.9 kg)  12/30/17 136 lb (61.7 kg)      Other studies Reviewed: Additional studies/ records that were reviewed today include: TTE 2015  Review of the above records today demonstrates:  - Left ventricle: The cavity size was normal. Wall thickness was normal. Systolic function was normal. The estimated ejection fraction was in the range of 60% to 65%. Wall motion was normal; there were no regional wall motion abnormalities. - Left atrium: The atrium was mildly dilated. - Atrial septum: No defect or patent foramen ovale was identified. - Pulmonary arteries: PA peak pressure: 32 mm Hg (S).  30 day monitor 01/08/18 - personally reviewed Sinus rhythm with occasional PACs Possible atrial fibrillation with artifact affecting interpretation  ASSESSMENT AND PLAN:  1.  Paroxysmal atrial  fibrillation: On Eliquis.  Had ablation 03/25/2017.  No obvious recurrences.   This patients CHA2DS2-VASc Score and unadjusted Ischemic Stroke Rate (% per year) is equal to 7.2 % stroke rate/year from a score of 5  Above score calculated as 1 point each if present [CHF, HTN, DM, Vascular=MI/PAD/Aortic Plaque, Age if 65-74, or Female] Above score calculated as 2 points each if present [Age > 75, or Stroke/TIA/TE]  2. Hypertension: Currently well controlled.  No changes.   3. 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  No obvious recurrences.    5.  PACs: Likely cause of her palpitations.  Plan to stop atenolol and start diltiazem today.  Current medicines are reviewed at length with the patient today.   The patient does not have concerns regarding her medicines.  The following changes were made today: Stop atenolol, start diltiazem  Labs/ tests ordered today include:  No orders of the defined types were placed in this encounter.    Disposition:   FU with Will Camnitz 3 months  Signed, Will Meredith Leeds, MD  04/07/2018 2:53 PM     [facility] [address] [name] [address] [phone] (office) [phone] (fax)

## 2018-04-07 NOTE — Patient Instructions (Signed)
Medication Instructions: 1) STOP Atenolol   2) START Diltiazem  240 mg once daily   Labwork: None ordered  Procedures/Testing: None ordered  Follow-Up: Your physician recommends that you schedule a follow-up appointment in: 3 months with Dr. Curt Bears.   Any Additional Special Instructions Will Be Listed Below (If Applicable).     If you need a refill on your cardiac medications before your next appointment, please call your pharmacy.

## 2018-04-07 NOTE — Addendum Note (Signed)
Addended by: Sandrea Hammond D on: 04/07/2018 03:24 PM   Modules accepted: Orders

## 2018-04-14 ENCOUNTER — Other Ambulatory Visit (INDEPENDENT_AMBULATORY_CARE_PROVIDER_SITE_OTHER): Payer: Self-pay | Admitting: Otolaryngology

## 2018-04-14 ENCOUNTER — Other Ambulatory Visit: Payer: Self-pay | Admitting: Internal Medicine

## 2018-04-14 DIAGNOSIS — J342 Deviated nasal septum: Secondary | ICD-10-CM | POA: Diagnosis not present

## 2018-04-14 DIAGNOSIS — J343 Hypertrophy of nasal turbinates: Secondary | ICD-10-CM | POA: Diagnosis not present

## 2018-04-14 DIAGNOSIS — J310 Chronic rhinitis: Secondary | ICD-10-CM | POA: Diagnosis not present

## 2018-04-14 DIAGNOSIS — J329 Chronic sinusitis, unspecified: Secondary | ICD-10-CM

## 2018-04-15 NOTE — Telephone Encounter (Signed)
Eliquis 5mg  refill request received; pt is 77 yrs old, wt-61.1kg, Crea-1.13 on 04/01/18, last seen by Dr. Curt Bears on 04/07/18; will send in refill to requested pharmacy.

## 2018-04-17 ENCOUNTER — Ambulatory Visit (HOSPITAL_COMMUNITY)
Admission: RE | Admit: 2018-04-17 | Discharge: 2018-04-17 | Disposition: A | Discharge status: Home / Self Care | Payer: PPO | Source: Ambulatory Visit | Attending: Otolaryngology | Admitting: Otolaryngology

## 2018-04-17 ENCOUNTER — Encounter: Payer: Self-pay | Admitting: *Deleted

## 2018-04-17 ENCOUNTER — Other Ambulatory Visit: Payer: Self-pay | Admitting: *Deleted

## 2018-04-17 DIAGNOSIS — J329 Chronic sinusitis, unspecified: Secondary | ICD-10-CM | POA: Diagnosis not present

## 2018-04-17 DIAGNOSIS — R9389 Abnormal findings on diagnostic imaging of other specified body structures: Secondary | ICD-10-CM | POA: Diagnosis not present

## 2018-04-17 DIAGNOSIS — J320 Chronic maxillary sinusitis: Secondary | ICD-10-CM | POA: Diagnosis not present

## 2018-04-17 NOTE — Patient Outreach (Signed)
[facility]) Care Management  04/17/2018  Kaitlyn Hoover 08-15-1941 [id number]  Referral from [facility] transition; Reason: Copay assistance. Member states will soon be in donut hole with medications.  Per chart review: Recent ED visit 04/01/2018 with c/o chest pain Cardiology office visit 04/07/2018  Telephone call attempt #1; left HIPPA compliant voice mail requesting call back.  Plan: Send outreach letter to patient; Follow up 2-4 business days.  Sherrin Daisy, RN BSN [facility] Management Coordinator [facility] Care Management  [phone]

## 2018-04-18 DIAGNOSIS — J019 Acute sinusitis, unspecified: Secondary | ICD-10-CM | POA: Diagnosis not present

## 2018-04-23 ENCOUNTER — Other Ambulatory Visit: Payer: Self-pay | Admitting: *Deleted

## 2018-04-23 NOTE — Patient Outreach (Signed)
[facility]) Care Management  04/23/2018  ALLIA WILTSEY Nov 08, 1941 [id number]  Referral from [place] transition; Reason: Copay assistance. Member states will soon be in donut hole with medications.  Per chart review: Recent ED visit 04/01/2018 with c/o chest pain Cardiology office visit 04/07/2018  Telephone call to patient who was advised of reason for call & of [facility] care management services. HIPPA verification received from patient.   Patient voices he has all of her medications and is currently not in donut hole. States she was in donut ho;e last year and received assistance for Eliquis from [employer]. States she has contact information from [employer] and plans to call them when she gets into donut hole this year if she needs help.   States she jhas no concerns at this time.  Patient advised to call [facility] care management if she has case management needs in the future. Patient voices understanding & thanked this care coordinator for call.  Plan: Send [facility] contact information. Close case.  Sherrin Daisy, RN BSN [place] Management Coordinator [facility] Care Management  [phone]

## 2018-05-25 ENCOUNTER — Telehealth: Payer: Self-pay

## 2018-05-25 NOTE — Telephone Encounter (Signed)
**Note De-Identified  Obfuscation** The pt dropped the provider part of her BMS Pt asst application off at the office. It is unclear where the remaining application is. I have left a detailed message on the pts VM asking her to call me back to discuss.

## 2018-06-01 ENCOUNTER — Other Ambulatory Visit: Payer: Self-pay

## 2018-06-01 MED ORDER — APIXABAN 5 MG PO TABS: 5.0000 mg | ORAL_TABLET | Freq: Two times a day (BID) | ORAL | 3 refills | Status: DC

## 2018-06-01 NOTE — Telephone Encounter (Signed)
**Note De-Identified  Obfuscation** I was able to reach the pt by phone. Per her request I have left the completed provider part of her BMS pt asst application for Eliquis in the front office and she states that she will pick up today. She states that she is going to mail her pt asst application in to BMS herself.

## 2018-06-01 NOTE — Telephone Encounter (Signed)
Dr Marlou Porch has signed the pts BMS pt asst application. I have called and left a message on the pts VM asking her to call me back.

## 2018-06-09 DIAGNOSIS — F28 Other psychotic disorder not due to a substance or known physiological condition: Secondary | ICD-10-CM | POA: Diagnosis not present

## 2018-06-22 ENCOUNTER — Ambulatory Visit (INDEPENDENT_AMBULATORY_CARE_PROVIDER_SITE_OTHER): Payer: PPO | Admitting: Otolaryngology

## 2018-06-26 NOTE — Telephone Encounter (Signed)
Letter received from [employer] via fax stating that they have approved the pt for pt asst with Eliquis. Approval good from 06/26/18 until 11/17/18. Application Case# [id number]

## 2018-07-07 ENCOUNTER — Encounter: Payer: Self-pay | Admitting: Cardiology

## 2018-07-07 ENCOUNTER — Ambulatory Visit: Payer: PPO | Admitting: Cardiology

## 2018-07-07 VITALS — BP 122/60 | HR 69 | Ht 62.0 in | Wt 137.0 lb

## 2018-07-07 DIAGNOSIS — E785 Hyperlipidemia, unspecified: Secondary | ICD-10-CM | POA: Diagnosis not present

## 2018-07-07 DIAGNOSIS — I491 Atrial premature depolarization: Secondary | ICD-10-CM

## 2018-07-07 DIAGNOSIS — I480 Paroxysmal atrial fibrillation: Secondary | ICD-10-CM

## 2018-07-07 DIAGNOSIS — I10 Essential (primary) hypertension: Secondary | ICD-10-CM | POA: Diagnosis not present

## 2018-07-07 DIAGNOSIS — I471 Supraventricular tachycardia: Secondary | ICD-10-CM | POA: Diagnosis not present

## 2018-07-07 NOTE — Progress Notes (Signed)
Electrophysiology Office Note   Date:  07/07/2018   ID:  [name], Lung 1941-09-22, MRN [id number]  PCP:  [name], MD  Cardiologist:  Marlou Porch Primary Electrophysiologist:  Abraham Margulies Meredith Leeds, MD    No chief complaint on file.    History of Present Illness: Kaitlyn Hoover is a 77 y.o. female who is being seen today for the evaluation of SVT, atrial fibrillation at the request of [name], MD. Presenting today for electrophysiology evaluation. She had multiple episodes of ER visits due to atrial fibrillation. She was initially put on diltiazem 180 mg which has improved her symptoms. Her episodes of SVT have been converted with Valsalva. She had AF ablation 04/25/17 and is presenting for follow up. 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  Today, denies symptoms of palpitations, chest pain, shortness of breath, orthopnea, PND, lower extremity edema, claudication, dizziness, presyncope, syncope, bleeding, or neurologic sequela. The patient is tolerating medications without difficulties.  Overall she is feeling well.  She has not noted any further episodes of SVT or atrial fibrillation.  She does continue to have PACs but she is minimally symptomatic.  She is tolerating her diltiazem without issue.   Past Medical History:  Diagnosis Date  . Anxiety   . Arthritis   . Atrial fibrillation with RVR ([place])   . Bipolar disorder ([place])   . Dysrhythmia   . GERD (gastroesophageal reflux disease)   . Hypertension   . Hypothyroidism   . Palpitations   . PAT (paroxysmal atrial tachycardia) ([place])   . Post-polio syndrome   . Sinus infection   . 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 ABDOMINAL HYSTERECTOMY    . ABLATION OF DYSRHYTHMIC FOCUS  04/25/2017  . ATRIAL FIBRILLATION ABLATION N/A 04/25/2017   Procedure: Atrial Fibrillation Ablation;  Surgeon: Constance Haw, MD;  Location: [place] [facility];  Service: Cardiovascular;  Laterality:  N/A;  . BREAST EXCISIONAL BIOPSY Bilateral    benign  . CATARACT EXTRACTION W/ INTRAOCULAR LENS  IMPLANT, BILATERAL    . CERVICAL LAMINECTOMY    . CESAREAN SECTION     X2   . LEGS      AGE 14-11   SEVERAL SURGERIES FOR POLIO   . SVT ABLATION N/A 11/26/2017   Procedure: SVT ABLATION;  Surgeon: Constance Haw, MD;  Location: [place] [facility];  Service: Cardiovascular;  Laterality: N/A;  . THYROIDECTOMY       Current Outpatient Medications  Medication Sig Dispense Refill  . acyclovir (ZOVIRAX) 400 MG tablet Take 400 mg at bedtime by mouth.     Marland Kitchen alendronate (FOSAMAX) 70 MG tablet Take 70 mg by mouth every Sunday. Take with a full glass of water on an empty stomach.     Marland Kitchen apixaban (ELIQUIS) 5 MG TABS tablet Take 1 tablet (5 mg total) by mouth 2 (two) times daily. 180 tablet 3  . Ascorbic Acid (VITAMIN C) 1000 MG tablet Take 1,000 mg by mouth daily.    Marland Kitchen azelastine (OPTIVAR) 0.05 % ophthalmic solution Place 2 drops into both eyes daily as needed. Inching eyes    . Calcium Citrate-Vitamin D (CALCIUM CITRATE + D PO) Take 2 tablets by mouth at bedtime.     . Coenzyme Q10 (CO Q-10) 100 MG CAPS Take 100 mg by mouth daily.     Marland Kitchen D-Ribose (RIBOSE, D,) POWD Take 2 g by mouth daily.     Marland Kitchen  diltiazem (CARDIZEM CD) 240 MG 24 hr capsule Take 1 capsule (240 mg total) by mouth daily. 30 capsule 3  . donepezil (ARICEPT) 5 MG tablet Take 1 tablet by mouth daily.    . fexofenadine (ALLEGRA) 180 MG tablet Take 180 mg by mouth daily.    . fluticasone (FLONASE) 50 MCG/ACT nasal spray Place 2 sprays into both nostrils daily.    . Ginkgo Biloba 120 MG TABS Take 120 mg by mouth 2 (two) times daily.     Marland Kitchen iloperidone (FANAPT) 4 MG TABS tablet Take 1 mg by mouth at bedtime.     Marland Kitchen levothyroxine (SYNTHROID, LEVOTHROID) 100 MCG tablet Take 100 mcg by mouth daily before breakfast.     . Magnesium 200 MG TABS Take 200 mg by mouth every evening.    . Melatonin 3 MG TABS Take 3 mg by mouth at bedtime.    . Multiple  Vitamin (MULTIVITAMIN) capsule Take 1 capsule daily by mouth.     . Omega-3 Fatty Acids (FISH OIL BURP-LESS) 1000 MG CAPS Take 3,000 mg by mouth 2 (two) times daily.     Marland Kitchen omeprazole (PRILOSEC) 20 MG capsule Take 20 mg by mouth every morning.    Marland Kitchen oxybutynin (DITROPAN-XL) 10 MG 24 hr tablet Take 10 mg by mouth daily.     . Pyridoxine HCl (VITAMIN B-6) 500 MG tablet Take 500 mg by mouth 2 (two) times daily.    . rosuvastatin (CRESTOR) 10 MG tablet Take one tablet up to 4 times weekly , Sunday Monday, Wednesday and Friday - Oral (Patient taking differently: Take 10 mg by mouth See admin instructions. Take 10 mg by mouth daily on Sunday, Monday, Wednesday and Friday) 30 tablet 10  . sodium chloride (OCEAN) 0.65 % SOLN nasal spray Place 2 sprays into both nostrils every evening.    . temazepam (RESTORIL) 15 MG capsule Take 15 mg by mouth at bedtime.     . traMADol (ULTRAM) 50 MG tablet Take 1 tablet (50 mg total) by mouth 3 (three) times daily as needed. (Patient taking differently: Take 50 mg 3 (three) times daily as needed by mouth for moderate pain. ) 90 tablet 1  . ziprasidone (GEODON) 40 MG capsule Take 40 mg by mouth at bedtime.     . chlorthalidone (HYGROTON) 25 MG tablet Take 1 tablet (25 mg total) daily by mouth. 90 tablet 3   No current facility-administered medications for this visit.     Allergies:   Sulfa antibiotics; Ciprocinonide [fluocinolone]; and Ciprofloxacin   Social History:  The patient  reports that she has quit smoking. She has never used smokeless tobacco. She reports that she drinks alcohol. She reports that she does not use drugs.   Family History:  The patient's family history includes Atrial fibrillation in her brother; COPD in her brother; Cancer in her father; Hypertension in her mother.   ROS:  Please see the history of present illness.   Otherwise, review of systems is positive for palpitations.   All other systems are reviewed and negative.   PHYSICAL EXAM: VS:   BP 122/60   Pulse 69   Ht 5\' 2"  (1.575 m)   Wt 137 lb (62.1 kg)   LMP  (LMP Unknown)   SpO2 98%   BMI 25.06 kg/m  , BMI Body mass index is 25.06 kg/m. GEN: Well nourished, well developed, in no acute distress  HEENT: normal  Neck: no JVD, carotid bruits, or masses Cardiac: RRR; no murmurs, rubs, or  gallops,no edema  Respiratory:  clear to auscultation bilaterally, normal work of breathing GI: soft, nontender, nondistended, + BS MS: no deformity or atrophy  Skin: warm and dry Neuro:  Strength and sensation are intact Psych: euthymic mood, full affect  EKG:  EKG is ordered today. Personal review of the ekg ordered shows anus rhythm, PACs, rate 69  Recent Labs: 04/01/2018: BUN 20; Creatinine, Ser 1.13; Hemoglobin 12.4; Platelets 220; Potassium 3.7; Sodium 138    Lipid Panel     Component Value Date/Time   CHOL 136 02/12/2017 1013   TRIG 114 02/12/2017 1013   HDL 46 02/12/2017 1013   CHOLHDL 3.0 02/12/2017 1013   CHOLHDL 3.5 10/22/2016 1039   VLDL 19 10/22/2016 1039   LDLCALC 67 02/12/2017 1013     Wt Readings from Last 3 Encounters:  07/07/18 137 lb (62.1 kg)  04/07/18 134 lb 12.8 oz (61.1 kg)  04/01/18 132 lb (59.9 kg)      Other studies Reviewed: Additional studies/ records that were reviewed today include: TTE 2015  Review of the above records today demonstrates:  - Left ventricle: The cavity size was normal. Wall thickness was normal. Systolic function was normal. The estimated ejection fraction was in the range of 60% to 65%. Wall motion was normal; there were no regional wall motion abnormalities. - Left atrium: The atrium was mildly dilated. - Atrial septum: No defect or patent foramen ovale was identified. - Pulmonary arteries: PA peak pressure: 32 mm Hg (S).  30 day monitor 01/08/18 - personally reviewed Sinus rhythm with occasional PACs Possible atrial fibrillation with artifact affecting interpretation  ASSESSMENT AND PLAN:  1.  Paroxysmal  atrial fibrillation: On Eliquis.  Had ablation 03/25/2017.  No recurrences.    This patients CHA2DS2-VASc Score and unadjusted Ischemic Stroke Rate (% per year) is equal to 7.2 % stroke rate/year from a score of 5  Above score calculated as 1 point each if present [CHF, HTN, DM, Vascular=MI/PAD/Aortic Plaque, Age if 65-74, or Female] Above score calculated as 2 points each if present [Age > 75, or Stroke/TIA/TE]  2. Hypertension: Currently well controlled.  No changes.   3. 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  No obvious recurrences.  5.  PACs: Currently feeling well without obvious palpitations.  Tolerating diltiazem without issue.  Current medicines are reviewed at length with the patient today.   The patient does not have concerns regarding her medicines.  The following changes were made today: None  Labs/ tests ordered today include:  Orders Placed This Encounter  Procedures  . EKG 12-Lead     Disposition:   FU with Ayren Zumbro 12 months  Signed, Jamarria Real Meredith Leeds, MD  07/07/2018 2:32 PM     [place] [address] [place] [address] [phone] (office) [phone] (fax)

## 2018-07-07 NOTE — Patient Instructions (Signed)
Medication Instructions:  Your physician recommends that you continue on your current medications as directed. Please refer to the Current Medication list given to you today.  * If you need a refill on your cardiac medications before your next appointment, please call your pharmacy.   Labwork: None ordered *We will only notify you of abnormal results, otherwise continue current treatment plan.  Testing/Procedures: None ordered  Follow-Up: Your physician wants you to follow-up in: 1 year with Dr. Camnitz.  You will receive a reminder letter in the mail two months in advance. If you don't receive a letter, please call our office to schedule the follow-up appointment.  *Please note that any paperwork needing to be filled out by the provider will need to be addressed at the front desk prior to seeing the provider. Please note that any FMLA, disability or other documents regarding health condition is subject to a $25.00 charge that must be received prior to completion of paperwork in the form of a money order or check.  Thank you for choosing [facility]!!   Sherri Price, RN [phone]       

## 2018-08-06 ENCOUNTER — Other Ambulatory Visit: Payer: Self-pay | Admitting: Cardiology

## 2018-08-07 DIAGNOSIS — Z23 Encounter for immunization: Secondary | ICD-10-CM | POA: Diagnosis not present

## 2018-08-24 ENCOUNTER — Other Ambulatory Visit: Payer: Self-pay | Admitting: Psychiatry

## 2018-08-25 ENCOUNTER — Other Ambulatory Visit: Payer: Self-pay

## 2018-08-25 MED ORDER — TEMAZEPAM 15 MG PO CAPS: 15.0000 mg | ORAL_CAPSULE | Freq: Every day | ORAL | 0 refills | Status: DC

## 2018-09-11 ENCOUNTER — Other Ambulatory Visit: Payer: Self-pay

## 2018-09-11 MED ORDER — TEMAZEPAM 30 MG PO CAPS: 30.0000 mg | ORAL_CAPSULE | Freq: Every day | ORAL | 0 refills | Status: DC

## 2018-09-14 ENCOUNTER — Other Ambulatory Visit: Payer: Self-pay

## 2018-09-15 ENCOUNTER — Encounter: Payer: Self-pay | Admitting: Emergency Medicine

## 2018-09-15 DIAGNOSIS — F259 Schizoaffective disorder, unspecified: Secondary | ICD-10-CM

## 2018-09-22 ENCOUNTER — Other Ambulatory Visit: Payer: Self-pay | Admitting: Psychiatry

## 2018-09-29 ENCOUNTER — Ambulatory Visit: Payer: PPO | Admitting: Psychiatry

## 2018-09-29 ENCOUNTER — Encounter: Payer: Self-pay | Admitting: Psychiatry

## 2018-09-29 DIAGNOSIS — F5105 Insomnia due to other mental disorder: Secondary | ICD-10-CM | POA: Diagnosis not present

## 2018-09-29 DIAGNOSIS — F99 Mental disorder, not otherwise specified: Secondary | ICD-10-CM

## 2018-09-29 DIAGNOSIS — F250 Schizoaffective disorder, bipolar type: Secondary | ICD-10-CM

## 2018-09-29 DIAGNOSIS — G3184 Mild cognitive impairment, so stated: Secondary | ICD-10-CM | POA: Diagnosis not present

## 2018-09-29 NOTE — Progress Notes (Signed)
Kaitlyn Hoover [id number] Sep 20, 1941 77 y.o.  Subjective:   Patient ID:  Kaitlyn Hoover is a 77 y.o. (DOB November 27, 1940) female.  Chief Complaint:  Chief Complaint  Patient presents with  . Sleeping Problem  . Memory Loss  . Follow-up    psychosis and paranoia    HPI Kaitlyn Hoover presents to the office today for follow-up of the above. As happy and peaceful as I've ever been in my life.  Very pleased with the meds.  The only thing is inititial insomnia.  When goes to bed feels tensed up and anxious.  Racing thoughts and can't be still.  Can be any subject and not a lot of worry overall.  Wonders about Xanax.  Have had to increase Restoril and it did help.  Getting about 8 hours, but delayed cycle. To bed 11 and dog wakes her at 7:30 and hard to get back to sleep.  Patient reports stable mood and denies depressed or irritable moods.  Patient denies any recent difficulty with anxiety.  Patient reports that energy and motivation have been good.  Patient denies any difficulty with concentration.  Patient denies any suicidal ideation.  Has seen benefit from the Aricept definitely within 2-3 weeks felt more organized.  Better productivity.  Better memory.   Review of Systems:  Review of Systems  Constitutional: Positive for fatigue.  Neurological: Positive for facial asymmetry. Negative for tremors and weakness.  Psychiatric/Behavioral: Negative for agitation, behavioral problems, confusion, decreased concentration, dysphoric mood, hallucinations, self-injury, sleep disturbance and suicidal ideas. The patient is not nervous/anxious and is not hyperactive.     Medications: I have reviewed the patient's current medications.  Current Outpatient Medications  Medication Sig Dispense Refill  . acyclovir (ZOVIRAX) 400 MG tablet Take 400 mg at bedtime by mouth.     Marland Kitchen alendronate (FOSAMAX) 70 MG tablet Take 70 mg by mouth every Sunday. Take with a full glass of water on an empty stomach.      Marland Kitchen apixaban (ELIQUIS) 5 MG TABS tablet Take 1 tablet (5 mg total) by mouth 2 (two) times daily. 180 tablet 3  . Ascorbic Acid (VITAMIN C) 1000 MG tablet Take 1,000 mg by mouth daily.    Marland Kitchen azelastine (OPTIVAR) 0.05 % ophthalmic solution Place 2 drops into both eyes daily as needed. Inching eyes    . Calcium Citrate-Vitamin D (CALCIUM CITRATE + D PO) Take 2 tablets by mouth at bedtime.     Marland Kitchen CARTIA XT 240 MG 24 hr capsule TAKE ONE CAPSULE BY MOUTH ONE TIME DAILY  90 capsule 3  . Coenzyme Q10 (CO Q-10) 100 MG CAPS Take 100 mg by mouth daily.     Marland Kitchen D-Ribose (RIBOSE, D,) POWD Take 2 g by mouth daily.     Marland Kitchen donepezil (ARICEPT) 5 MG tablet Take 1 tablet by mouth daily. 1/2 q4wks 1 qd    . fexofenadine (ALLEGRA) 180 MG tablet Take 180 mg by mouth daily.    . fluticasone (FLONASE) 50 MCG/ACT nasal spray Place 2 sprays into both nostrils daily.    . Ginkgo Biloba 120 MG TABS Take 120 mg by mouth 2 (two) times daily.     Marland Kitchen levothyroxine (SYNTHROID, LEVOTHROID) 100 MCG tablet Take 100 mcg by mouth daily before breakfast.     . Magnesium 200 MG TABS Take 200 mg by mouth every evening.    . Melatonin 3 MG TABS Take 3 mg by mouth at bedtime.    . Multiple Vitamin (MULTIVITAMIN)  capsule Take 1 capsule daily by mouth.     . Omega-3 Fatty Acids (FISH OIL BURP-LESS) 1000 MG CAPS Take 3,000 mg by mouth 2 (two) times daily.     Marland Kitchen oxybutynin (DITROPAN-XL) 10 MG 24 hr tablet Take 10 mg by mouth daily.     . Pyridoxine HCl (VITAMIN B-6) 500 MG tablet Take 500 mg by mouth 2 (two) times daily.    . rosuvastatin (CRESTOR) 10 MG tablet Take one tablet up to 4 times weekly , Sunday Monday, Wednesday and Friday - Oral (Patient taking differently: Take 10 mg by mouth See admin instructions. Take 10 mg by mouth daily on Sunday, Monday, Wednesday and Friday) 30 tablet 10  . temazepam (RESTORIL) 30 MG capsule Take 1 capsule (30 mg total) by mouth at bedtime. 90 capsule 0  . ziprasidone (GEODON) 40 MG capsule TAKE ONE CAPSULE BY  MOUTH ONE TIME DAILY  30 capsule 0  . chlorthalidone (HYGROTON) 25 MG tablet Take 1 tablet (25 mg total) daily by mouth. 90 tablet 3  . iloperidone (FANAPT) 4 MG TABS tablet Take 1 mg by mouth at bedtime.     Marland Kitchen omeprazole (PRILOSEC) 20 MG capsule Take 20 mg by mouth every morning.    . sodium chloride (OCEAN) 0.65 % SOLN nasal spray Place 2 sprays into both nostrils every evening.    . traMADol (ULTRAM) 50 MG tablet Take 1 tablet (50 mg total) by mouth 3 (three) times daily as needed. (Patient not taking: Reported on 09/29/2018) 90 tablet 1   No current facility-administered medications for this visit.     Medication Side Effects: Other: EPS prone.  Allergies:  Allergies  Allergen Reactions  . Sulfa Antibiotics Rash and Other (See Comments)    Childhood allergy  . Ciprocinonide [Fluocinolone] Rash  . Ciprofloxacin Rash    Past Medical History:  Diagnosis Date  . Anxiety   . Arthritis   . Atrial fibrillation with RVR ([place])   . Bipolar disorder ([place])   . Dysrhythmia   . GERD (gastroesophageal reflux disease)   . Hypertension   . Hypothyroidism   . Palpitations   . PAT (paroxysmal atrial tachycardia) ([place])   . Post-polio syndrome   . Sinus infection   . 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 Hypertension Mother   . Cancer Father        JAW BONE  . COPD Brother   . Atrial fibrillation Brother     Social History   Socioeconomic History  . Marital status: Divorced    Spouse name: Not on file  . Number of children: Not on file  . Years of education: Not on file  . Highest education level: Not on file  Occupational History  . Not on file  Social Needs  . Financial resource strain: Not on file  . Food insecurity:    Worry: Not on file    Inability: Not on file  . Transportation needs:    Medical: Not on file    Non-medical: Not on file  Tobacco Use  . Smoking status: Former Research scientist (life sciences)  . Smokeless tobacco: Never Used   . Tobacco comment: QUIT SMOKING IN 1998  Substance and Sexual Activity  . Alcohol use: Yes    Comment: GLASS WINE W/ DINNER  . Drug use: No  . Sexual activity: Not on file  Lifestyle  . Physical activity:    Days  per week: Not on file    Minutes per session: Not on file  . Stress: Not on file  Relationships  . Social connections:    Talks on phone: Not on file    Gets together: Not on file    Attends religious service: Not on file    Active member of club or organization: Not on file    Attends meetings of clubs or organizations: Not on file    Relationship status: Not on file  . Intimate partner violence:    Fear of current or ex partner: Not on file    Emotionally abused: Not on file    Physically abused: Not on file    Forced sexual activity: Not on file  Other Topics Concern  . Not on file  Social History Narrative  . Not on file    Past Medical History, Surgical history, Social history, and Family history were reviewed and updated as appropriate.   Please see review of systems for further details on the patient's review from today.   Objective:   Physical Exam:  LMP  (LMP Unknown)   Physical Exam  Constitutional: She is oriented to person, place, and time. She appears well-developed. No distress.  Musculoskeletal: She exhibits no deformity.  Neurological: She is alert and oriented to person, place, and time. She displays no tremor. Coordination and gait normal.  Psychiatric: She has a normal mood and affect. Her speech is normal and behavior is normal. Judgment and thought content normal. Her mood appears not anxious. Her affect is not angry, not blunt, not labile and not inappropriate. Thought content is not paranoid. Cognition and memory are normal. She does not exhibit a depressed mood. She expresses no homicidal and no suicidal ideation. She expresses no suicidal plans and no homicidal plans.  Insight intact. No auditory or visual hallucinations. No delusions.    history of paranoia controlled.   Lab Review:     Component Value Date/Time   NA 138 04/01/2018 1748   NA 140 10/07/2017 1129   K 3.7 04/01/2018 1748   CL 100 (L) 04/01/2018 1748   CO2 28 04/01/2018 1748   GLUCOSE 108 (H) 04/01/2018 1748   BUN 20 04/01/2018 1748   BUN 24 10/07/2017 1129   CREATININE 1.13 (H) 04/01/2018 1748   CALCIUM 8.7 (L) 04/01/2018 1748   PROT 7.9 04/02/2017 0946   PROT 7.2 02/12/2017 1013   ALBUMIN 3.2 (L) 04/02/2017 0946   ALBUMIN 4.0 02/12/2017 1013   AST 31 04/02/2017 0946   ALT 15 04/02/2017 0946   ALKPHOS 81 04/02/2017 0946   BILITOT 0.5 04/02/2017 0946   BILITOT 0.4 02/12/2017 1013   GFRNONAA 46 (L) 04/01/2018 1748   GFRAA 53 (L) 04/01/2018 1748       Component Value Date/Time   WBC 5.7 04/01/2018 1748   RBC 3.94 04/01/2018 1748   HGB 12.4 04/01/2018 1748   HGB 12.0 04/11/2017 0911   HCT 36.7 04/01/2018 1748   HCT 36.8 04/11/2017 0911   PLT 220 04/01/2018 1748   PLT 306 04/11/2017 0911   MCV 93.1 04/01/2018 1748   MCV 90 04/11/2017 0911   MCH 31.5 04/01/2018 1748   MCHC 33.8 04/01/2018 1748   RDW 13.1 04/01/2018 1748   RDW 13.4 04/11/2017 0911   LYMPHSABS 1.8 04/11/2017 0911   MONOABS 0.5 04/02/2017 0946   EOSABS 0.2 04/11/2017 0911   BASOSABS 0.1 04/11/2017 0911    No results found for: POCLITH, LITHIUM   No results  found for: PHENYTOIN, PHENOBARB, VALPROATE, CBMZ   .res Assessment: Plan:    Schizoaffective disorder, 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 We discussed her med-sensitivity. Discussed potential metabolic side effects associated with atypical antipsychotics, as well as potential risk for movement side effects. Advised pt to contact office if movement side effects occur.  She has mild dysarthria which may be related but it is no worse and it stable.   She is very specifically aware of the risk of tardive dyskinesia.  The unusual combination of antipsychotics is helpful in that the [place] actually offsets some of the dystonia that the [place] tends to cause.  Lower dose of antipsychotics has led to worsening paranoia.  Her paranoia is well controlled at the current combination of medications.  The degree of cognitive impairment is very much on the mild side but she has seen marked improvement with very low dose Aricept.  Given her medication sensitivity we will not try to increase this.  Follow-up in a few months  This appointment 25 minutes  Lynder Parents MD, DFAPA   Please see After Visit Summary for patient specific instructions.  Future Appointments  Date Time Provider [place]  03/30/2019  1:00 PM Cottle, Billey Co., MD CP-CP None    No orders of the defined types were placed in this encounter.     -------------------------------

## 2018-10-14 ENCOUNTER — Other Ambulatory Visit: Payer: Self-pay | Admitting: Cardiology

## 2018-10-19 ENCOUNTER — Other Ambulatory Visit: Payer: Self-pay | Admitting: Psychiatry

## 2018-11-02 ENCOUNTER — Other Ambulatory Visit: Payer: Self-pay | Admitting: Family Medicine

## 2018-11-02 DIAGNOSIS — Z1231 Encounter for screening mammogram for malignant neoplasm of breast: Secondary | ICD-10-CM

## 2018-11-09 DIAGNOSIS — J019 Acute sinusitis, unspecified: Secondary | ICD-10-CM | POA: Diagnosis not present

## 2018-11-23 ENCOUNTER — Other Ambulatory Visit: Payer: Self-pay | Admitting: Cardiology

## 2018-11-24 ENCOUNTER — Telehealth: Payer: Self-pay | Admitting: Psychiatry

## 2018-11-24 MED ORDER — ILOPERIDONE 4 MG PO TABS: 4.0000 mg | ORAL_TABLET | Freq: Every day | ORAL | 5 refills | Status: DC

## 2018-11-24 NOTE — Telephone Encounter (Signed)
Needs refill of Fanapt 4mg  sent to [facility] on [address].

## 2018-12-09 ENCOUNTER — Ambulatory Visit: Payer: PPO

## 2018-12-17 ENCOUNTER — Ambulatory Visit
Admission: RE | Admit: 2018-12-17 | Discharge: 2018-12-17 | Disposition: A | Discharge status: Home / Self Care | Payer: PPO | Source: Ambulatory Visit | Attending: Family Medicine | Admitting: Family Medicine

## 2018-12-17 DIAGNOSIS — Z1231 Encounter for screening mammogram for malignant neoplasm of breast: Secondary | ICD-10-CM | POA: Diagnosis not present

## 2018-12-22 ENCOUNTER — Telehealth: Payer: Self-pay | Admitting: Psychiatry

## 2018-12-22 NOTE — Telephone Encounter (Signed)
Patient takes donepezil would like to know if she can go from a 30 day script to a 90 day script patient is requesting a refill still has 10 left.  Please send to [facility] in [place] on [address]

## 2018-12-23 ENCOUNTER — Other Ambulatory Visit: Payer: Self-pay

## 2018-12-23 MED ORDER — DONEPEZIL HCL 5 MG PO TABS: 5.0000 mg | ORAL_TABLET | Freq: Every day | ORAL | 1 refills | Status: DC

## 2018-12-23 NOTE — Telephone Encounter (Signed)
Refill submitted. 

## 2018-12-23 NOTE — Telephone Encounter (Signed)
Left voicemail to confirm correct dose

## 2019-01-11 ENCOUNTER — Other Ambulatory Visit: Payer: Self-pay | Admitting: Psychiatry

## 2019-02-02 DIAGNOSIS — E785 Hyperlipidemia, unspecified: Secondary | ICD-10-CM | POA: Diagnosis not present

## 2019-02-02 DIAGNOSIS — I4891 Unspecified atrial fibrillation: Secondary | ICD-10-CM | POA: Diagnosis not present

## 2019-02-02 DIAGNOSIS — Z0000 Encounter for general adult medical examination without abnormal findings: Secondary | ICD-10-CM | POA: Diagnosis not present

## 2019-02-02 DIAGNOSIS — F319 Bipolar disorder, unspecified: Secondary | ICD-10-CM | POA: Diagnosis not present

## 2019-02-02 DIAGNOSIS — I10 Essential (primary) hypertension: Secondary | ICD-10-CM | POA: Diagnosis not present

## 2019-02-02 DIAGNOSIS — E039 Hypothyroidism, unspecified: Secondary | ICD-10-CM | POA: Diagnosis not present

## 2019-02-15 ENCOUNTER — Other Ambulatory Visit: Payer: Self-pay | Admitting: Cardiology

## 2019-02-15 MED ORDER — DILTIAZEM HCL ER COATED BEADS 240 MG PO CP24: 240.0000 mg | ORAL_CAPSULE | Freq: Every day | ORAL | 1 refills | Status: DC

## 2019-03-11 ENCOUNTER — Other Ambulatory Visit: Payer: Self-pay

## 2019-03-11 MED ORDER — ZIPRASIDONE HCL 40 MG PO CAPS: 40.0000 mg | ORAL_CAPSULE | Freq: Every day | ORAL | 0 refills | Status: DC

## 2019-03-23 ENCOUNTER — Telehealth: Payer: Self-pay | Admitting: Neurology

## 2019-03-23 NOTE — Telephone Encounter (Signed)
I called pt back. She is requesting refill on tramadol. She asked PCP but they advised her to call Dr. Felecia Shelling for refill. Advised since she last saw Dr. Felecia Shelling in 2018 she will need to schedule a follow up appt. I scheduled a doxy.me virtual visit tomorrow at 1030am with Dr. Felecia Shelling. Texted her link at [phone] She confirmed receipt while I was on the phone with her. Updated medication list, pharmacy,allergies on file.

## 2019-03-23 NOTE — Addendum Note (Signed)
Addended by: Hope Pigeon on: 03/23/2019 02:47 PM   Modules accepted: Orders

## 2019-03-23 NOTE — Telephone Encounter (Signed)
Pt is asking for a refill on her traMADol (ULTRAM) 50 MG tablet [id number]

## 2019-03-24 ENCOUNTER — Encounter: Payer: Self-pay | Admitting: Neurology

## 2019-03-24 ENCOUNTER — Other Ambulatory Visit: Payer: Self-pay

## 2019-03-24 ENCOUNTER — Ambulatory Visit (INDEPENDENT_AMBULATORY_CARE_PROVIDER_SITE_OTHER): Payer: PPO | Admitting: Neurology

## 2019-03-24 DIAGNOSIS — G14 Postpolio syndrome: Secondary | ICD-10-CM | POA: Diagnosis not present

## 2019-03-24 DIAGNOSIS — G8929 Other chronic pain: Secondary | ICD-10-CM | POA: Diagnosis not present

## 2019-03-24 DIAGNOSIS — M545 Low back pain, unspecified: Secondary | ICD-10-CM

## 2019-03-24 DIAGNOSIS — M549 Dorsalgia, unspecified: Secondary | ICD-10-CM

## 2019-03-24 DIAGNOSIS — R269 Unspecified abnormalities of gait and mobility: Secondary | ICD-10-CM | POA: Diagnosis not present

## 2019-03-24 DIAGNOSIS — M4316 Spondylolisthesis, lumbar region: Secondary | ICD-10-CM

## 2019-03-24 MED ORDER — TRAMADOL HCL 50 MG PO TABS: 50.0000 mg | ORAL_TABLET | Freq: Three times a day (TID) | ORAL | 3 refills | Status: DC | PRN

## 2019-03-24 MED ORDER — METHYLPREDNISOLONE 4 MG PO TABS: ORAL_TABLET | ORAL | 0 refills | Status: DC

## 2019-03-24 NOTE — Progress Notes (Signed)
[facility]  PATIENT: Kaitlyn Hoover DOB: 01-29-1941  REFERRING DOCTOR OR PCP:  [name], MD SOURCE: patient, records form Dr. Justin Mend, records in EMR  _________________________________   HISTORICAL  CHIEF COMPLAINT:  No chief complaint on file.   HISTORY OF PRESENT ILLNESS:  Kaitlyn Hoover is a 78 year old woman who had progressive weakness in her legs.   Update 03/24/2019; Virtual Visit via Video Note I connected with [name] on 03/24/19 at 10:30 AM EDT by a video enabled telemedicine application and verified that I am speaking with the correct person.  I discussed the limitations of evaluation and management by telemedicine and the availability of in person appointments. The patient expressed understanding and agreed to proceed.  History of Present Illness: She has continued pain in her legs and back.  Pain increases with walking and chores and lifting.   A couple days ago pain was worse after she did more  lifting.     She has post-polio syndrome.  She is having more leg weakness and her gait is worse.   She needs to use a scooter at the store.   She can walk about a hundred feet without a stop.  She can squat but rising up is more difficult than last year.   She is weaker on her right.  She has a right AFO and does not wear it much since the fit is poor.     Observations/Objective She is a well-developed well-nourished woman in no acute distress.  The head is normocephalic and atraumatic.  Sclera are anicteric.  Visible skin appears normal.  The neck has a good range of motion.     She is alert and fully oriented with fluent speech and good attention, knowledge and memory.  Extraocular muscles are intact.  Facial strength is normal.     She appears to have normal strength in the arms.  Rapid alternating movements and finger-nose-finger are performed well.  Assessment and Plan: Post poliomyelitis syndrome  Spondylolisthesis, lumbar region  Gait  disturbance  Chronic midline low back pain without sciatica  1.  I will renew tramadol.  She can take 1 to 3 tablets daily as needed for pain. 2.  We discussed getting another right AFO for her right foot drop.  She prefers to wait at this time. 3.   Try to stay active as possible. 4.   Due to increased pain after moving a heavy box, I will write a steroid pack.  If she continues to have pain she may need an imaging study.   5.   She will return to see [place] in 1 year or sooner if there are new or worsening neurologic symptoms.  Tramadol can be renewed if needed before then  Follow Up Instructions: I discussed the assessment and treatment plan with the patient. The patient was provided an opportunity to ask questions and all were answered. The patient agreed with the plan and demonstrated an understanding of the instructions.    The patient was advised to call back or seek an in-person evaluation if the symptoms worsen or if the condition fails to improve as anticipated.  I provided 18 minutes of non-face-to-face time during this encounter.  ____________________ From previous visits: Update 09/09/2017:   She had surgery September 2017 by Dr. Christella Noa and pain improved after her surgery.  Gait may have improved mildly but she continues to feel gait is poor.   She has post-polio syndrome diagnosed by history and an  EMG at a post-polio clinic.   She notes more slow weakness since earlier this year.   She is able to squat and rise.     She is able to walk a block before she stops due to weakness.   She denies any numbness in her legs though does have some in hands (has Dx of CTS).   Bladder function is fine.    She has been on Crestor since late 2017 but she has not noted any cramps.   She has an AFO brace (> 55 years old)for the right foot but does not wear it.    From 09/05/2016: She was diagnosed with post polio syndrome in 1998.    MRI of the lumbar spine showed severe spinal stenosis.    Her legs  felt weaker and more painful as she walked more.     Pain was mildly better when she leans forward as she walks.        Dr. Christella Noa at [facility] did a fusion surgey and she feels the pain and gait are both better.   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  She had severe L4L5 stenosis with AP diameter only 5 mm and apparent left S1 nerve root compression on MRI.    Post-op images were reviewed and alignment looks good.    She feels strength is better though still has right > left leg weakness and has atrophy more on the right (right side has always been worse)   Polio History:   She had polio when she was 4 and it affected her legs, much more so on the right. Over the next year or 2 she recovered and was able to be very active as a child. Starting around age 45, she began to notice that she was getting weaker again in the legs. Initially the right leg was more involved but both legs seem to be involved now.   In 1998, she went to a post polio center in [place] and had a nerve conduction/EMG test performed confirming the history of polio and she was told that it was consistent with post polio syndrome.  Those results are not available at this time.  Spinal stenosis: MRI of the lumbar spine shows severe spinal stenosis at L4-L5 with an AP diameter of only about 5. Mm.   There is also left greater than right lateral recess stenosis there that could be causing L5 nerve root compression. There is milder spinal stenosis at L5-S1 that could lead to left S1 nerve root compression.   She does report symptoms of leg pain as she walks that are generally better when she is leaning forward generally worse when she is standing up straight. She finds her legs become weaker and weaker as she walks.  REVIEW OF SYSTEMS: Constitutional: No fevers, chills, sweats, or change in appetite Eyes: No visual changes, double vision, eye pain Ear, nose and throat: No hearing loss, ear  pain, nasal congestion, sore throat Cardiovascular: No chest pain, palpitations Respiratory: No shortness of breath at rest or with exertion.   No wheezes GastrointestinaI: No nausea, vomiting, diarrhea, abdominal pain, fecal incontinence Genitourinary: No dysuria, urinary retention or frequency.  No nocturia. Musculoskeletal: No neck pain, back pain Integumentary: No rash, pruritus, skin lesions Neurological: as above Psychiatric: No depression at this time.  No anxiety Endocrine: No palpitations, diaphoresis, change in appetite, change in weigh or increased thirst Hematologic/Lymphatic: No anemia, purpura, petechiae. Allergic/Immunologic:  No itchy/runny eyes, nasal congestion, recent allergic reactions, rashes  ALLERGIES: Allergies  Allergen Reactions  . Sulfa Antibiotics Rash and Other (See Comments)    Childhood allergy  . Ciprocinonide [Fluocinolone] Rash  . Ciprofloxacin Rash    HOME MEDICATIONS:  Current Outpatient Medications:  .  acyclovir (ZOVIRAX) 400 MG tablet, Take 400 mg at bedtime by mouth. , Disp: , Rfl:  .  alendronate (FOSAMAX) 70 MG tablet, Take 70 mg by mouth every Sunday. Take with a full glass of water on an empty stomach. , Disp: , Rfl:  .  apixaban (ELIQUIS) 5 MG TABS tablet, Take 1 tablet (5 mg total) by mouth 2 (two) times daily., Disp: 180 tablet, Rfl: 3 .  Ascorbic Acid (VITAMIN C) 1000 MG tablet, Take 1,000 mg by mouth daily., Disp: , Rfl:  .  azelastine (OPTIVAR) 0.05 % ophthalmic solution, Place 2 drops into both eyes daily as needed. Inching eyes, Disp: , Rfl:  .  Calcium Citrate-Vitamin D (CALCIUM CITRATE + D PO), Take 2 tablets by mouth at bedtime. , Disp: , Rfl:  .  chlorthalidone (HYGROTON) 25 MG tablet, TAKE ONE TABLET BY MOUTH ONE TIME DAILY , Disp: 90 tablet, Rfl: 2 .  Coenzyme Q10 (CO Q-10) 100 MG CAPS, Take 100 mg by mouth daily. , Disp: , Rfl:  .  D-Ribose (RIBOSE, D,) POWD, Take 2 g by mouth daily. , Disp: , Rfl:  .  diltiazem (CARTIA  XT) 240 MG 24 hr capsule, Take 1 capsule (240 mg total) by mouth daily., Disp: 90 capsule, Rfl: 1 .  donepezil (ARICEPT) 5 MG tablet, Take 1 tablet (5 mg total) by mouth daily., Disp: 90 tablet, Rfl: 1 .  fexofenadine (ALLEGRA) 180 MG tablet, Take 180 mg by mouth daily., Disp: , Rfl:  .  fluticasone (FLONASE) 50 MCG/ACT nasal spray, Place 2 sprays into both nostrils daily., Disp: , Rfl:  .  Ginkgo Biloba 120 MG TABS, Take 120 mg by mouth 2 (two) times daily. , Disp: , Rfl:  .  iloperidone (FANAPT) 4 MG TABS tablet, Take 1 tablet (4 mg total) by mouth at bedtime. (Patient taking differently: Take 4 mg by mouth at bedtime. 1/4 tablets at bedtime), Disp: 30 tablet, Rfl: 5 .  levothyroxine (SYNTHROID, LEVOTHROID) 100 MCG tablet, Take 100 mcg by mouth daily before breakfast. , Disp: , Rfl:  .  Magnesium 200 MG TABS, Take 200 mg by mouth every evening., Disp: , Rfl:  .  Melatonin 3 MG TABS, Take 3 mg by mouth at bedtime., Disp: , Rfl:  .  methylPREDNISolone (MEDROL) 4 MG tablet, Taper from 6 pills po for one day to 1 pill po the last day over 6 days, Disp: 21 tablet, Rfl: 0 .  Multiple Vitamin (MULTIVITAMIN) capsule, Take 1 capsule daily by mouth. , Disp: , Rfl:  .  Omega-3 Fatty Acids (FISH OIL BURP-LESS) 1000 MG CAPS, Take 3,000 mg by mouth 2 (two) times daily. , Disp: , Rfl:  .  oxybutynin (DITROPAN-XL) 10 MG 24 hr tablet, Take 15 mg by mouth daily. , Disp: , Rfl:  .  rosuvastatin (CRESTOR) 10 MG tablet, TAKE ONE TABLET BY MOUTH UP TO 4 TIMES A WEEK ON SUNDAY,MONDAY,WEDNESDAY AND FRIDAY, Disp: 30 tablet, Rfl: 8 .  sodium chloride (OCEAN) 0.65 % SOLN nasal spray, Place 2 sprays into both nostrils every evening., Disp: , Rfl:  .  temazepam (RESTORIL) 30 MG capsule, TAKE ONE CAPSULE BY MOUTH AT BEDTIME , Disp: 90 capsule, Rfl: 0 .  traMADol (ULTRAM) 50 MG tablet, Take 1 tablet (50 mg total) by mouth 3 (three) times daily as needed., Disp: 90 tablet, Rfl: 3 .  ziprasidone (GEODON) 40 MG capsule, Take 1  capsule (40 mg total) by mouth daily., Disp: 90 capsule, Rfl: 0  PAST MEDICAL HISTORY: Past Medical History:  Diagnosis Date  . Anxiety   . Arthritis   . Atrial fibrillation with RVR ([place])   . Bipolar disorder ([place])   . Dysrhythmia   . GERD (gastroesophageal reflux disease)   . Hypertension   . Hypothyroidism   . Palpitations   . PAT (paroxysmal atrial tachycardia) ([place])   . Post-polio syndrome   . Sinus infection   . 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 ABDOMINAL HYSTERECTOMY    . ABLATION OF DYSRHYTHMIC FOCUS  04/25/2017  . ATRIAL FIBRILLATION ABLATION N/A 04/25/2017   Procedure: Atrial Fibrillation Ablation;  Surgeon: Constance Haw, MD;  Location: [facility];  Service: Cardiovascular;  Laterality: N/A;  . BREAST EXCISIONAL BIOPSY Bilateral    benign  . CATARACT EXTRACTION W/ INTRAOCULAR LENS  IMPLANT, BILATERAL    . CERVICAL LAMINECTOMY    . CESAREAN SECTION     X2   . LEGS      AGE 3-11   SEVERAL SURGERIES FOR POLIO   . SVT ABLATION N/A 11/26/2017   Procedure: SVT ABLATION;  Surgeon: Constance Haw, MD;  Location: [facility];  Service: Cardiovascular;  Laterality: N/A;  . THYROIDECTOMY      FAMILY HISTORY: Family History  Problem Relation Age of Onset  . Hypertension Mother   . Cancer Father        JAW BONE  . COPD Brother   . Atrial fibrillation Brother     SOCIAL HISTORY:  Social History   Socioeconomic History  . Marital status: Divorced    Spouse name: Not on file  . Number of children: Not on file  . Years of education: Not on file  . Highest education level: Not on file  Occupational History  . Not on file  Social Needs  . Financial resource strain: Not on file  . Food insecurity:    Worry: Not on file    Inability: Not on file  . Transportation needs:    Medical: Not on file    Non-medical: Not on file  Tobacco Use  . Smoking status:  Former Research scientist (life sciences)  . Smokeless tobacco: Never Used  . Tobacco comment: QUIT SMOKING IN 1998  Substance and Sexual Activity  . Alcohol use: Yes    Comment: GLASS WINE W/ DINNER  . Drug use: No  . Sexual activity: Not on file  Lifestyle  . Physical activity:    Days per week: Not on file    Minutes per session: Not on file  . Stress: Not on file  Relationships  . Social connections:    Talks on phone: Not on file    Gets together: Not on file    Attends religious service: Not on file    Active member of club or organization: Not on file    Attends meetings of clubs or organizations: Not on file    Relationship status: Not on file  . Intimate partner violence:    Fear of current or ex partner: Not on file    Emotionally abused: Not on file    Physically  abused: Not on file    Forced sexual activity: Not on file  Other Topics Concern  . Not on file  Social History Narrative  . Not on file     PHYSICAL EXAM  There were no vitals filed for this visit.  There is no height or weight on file to calculate BMI.   General: The patient is well-developed and well-nourished and in no acute distress   Extremities:   The right leg is mildly shorter than the left leg.  Musculoskeletal:  Back is nontender  Neurologic Exam  Mental status: The patient is alert and oriented x 3 at the time of the examination. The patient has apparent normal recent and remote memory, with an apparently normal attention span and concentration ability.   Speech is normal.  Cranial nerves: Extraocular movements are full.  There is good facial sensation to soft touch bilaterally.Facial strength is normal.  Trapezius and sternocleidomastoid strength is normal. No dysarthria is noted.  The tongue is midline, and the patient has symmetric elevation of the soft palate. No obvious hearing deficits are noted.  Motor:  There is reduced muscle bulk in the right leg. Muscle tone is fairly symmetric.Marland Kitchen Strength is  5 / 5  in both arms and left leg.   Strength is 4+-5/5 in iliopsoas and quads, 4/5 hamstrings, 3/5 tib ant,  1/5 EHL , 2/5 in the EDB..   Sensory: Sensory testing is intact to touch and vibration  Coordination: Cerebellar testing reveals good finger-nose-finger and heel-to-shin bilaterally.  Gait and station: Station is normal.   Gait has right foot drop. Tandem gait is wide. Romberg is negative.   Reflexes: Deep tendon reflexes are reduced in right leg compared to left.   Plantar responses are flexor.    DIAGNOSTIC DATA (LABS, IMAGING, TESTING) - I reviewed patient records, labs, notes, testing and imaging myself where available.  Lab Results  Component Value Date   WBC 5.7 04/01/2018   HGB 12.4 04/01/2018   HCT 36.7 04/01/2018   MCV 93.1 04/01/2018   PLT 220 04/01/2018      Component Value Date/Time   NA 138 04/01/2018 1748   NA 140 10/07/2017 1129   K 3.7 04/01/2018 1748   CL 100 (L) 04/01/2018 1748   CO2 28 04/01/2018 1748   GLUCOSE 108 (H) 04/01/2018 1748   BUN 20 04/01/2018 1748   BUN 24 10/07/2017 1129   CREATININE 1.13 (H) 04/01/2018 1748   CALCIUM 8.7 (L) 04/01/2018 1748   PROT 7.9 04/02/2017 0946   PROT 7.2 02/12/2017 1013   ALBUMIN 3.2 (L) 04/02/2017 0946   ALBUMIN 4.0 02/12/2017 1013   AST 31 04/02/2017 0946   ALT 15 04/02/2017 0946   ALKPHOS 81 04/02/2017 0946   BILITOT 0.5 04/02/2017 0946   BILITOT 0.4 02/12/2017 1013   GFRNONAA 46 (L) 04/01/2018 1748   GFRAA 53 (L) 04/01/2018 1748   Lab Results  Component Value Date   CHOL 136 02/12/2017   HDL 46 02/12/2017   LDLCALC 67 02/12/2017   TRIG 114 02/12/2017   CHOLHDL 3.0 02/12/2017   No results found for: HGBA1C No results found for: VITAMINB12 Lab Results  Component Value Date   TSH 0.748 04/18/2014       ASSESSMENT AND PLAN  Post poliomyelitis syndrome  Spondylolisthesis, lumbar region  Gait disturbance  Chronic midline low back pain without sciatica   Richard A. Felecia Shelling, MD, PhD 07/21/8100,  75:10 AM Certified in Neurology, Clinical Neurophysiology, Sleep Medicine, Pain Medicine  and Neuroimaging  [facility] [address], [place], [place] [address] [phone]

## 2019-03-25 ENCOUNTER — Telehealth: Payer: Self-pay | Admitting: *Deleted

## 2019-03-25 NOTE — Telephone Encounter (Signed)
Received fax notification from [employer] that PA approved until 04/08/19. Faxed notice of approval to [facility] at [phone]. Received fax confirmation.

## 2019-03-25 NOTE — Telephone Encounter (Signed)
Submitted PA tramadol on CMM for qty limit exception. Pt only able to get 7 days supply at a time w/o PA. Key: [id number]. Waiting on determination.  "[employer] has received your information, and the request will be reviewed. You will receive an electronic determination in CoverMyMeds. You can see the latest determination by locating this request on your dashboard or by reopening this request. You will also receive a faxed copy of the determination. If you have any questions please contact [employer] at [phone]."

## 2019-03-30 ENCOUNTER — Encounter: Payer: Self-pay | Admitting: Psychiatry

## 2019-03-30 ENCOUNTER — Ambulatory Visit (INDEPENDENT_AMBULATORY_CARE_PROVIDER_SITE_OTHER): Payer: PPO | Admitting: Psychiatry

## 2019-03-30 ENCOUNTER — Other Ambulatory Visit: Payer: Self-pay

## 2019-03-30 DIAGNOSIS — F99 Mental disorder, not otherwise specified: Secondary | ICD-10-CM | POA: Diagnosis not present

## 2019-03-30 DIAGNOSIS — F250 Schizoaffective disorder, bipolar type: Secondary | ICD-10-CM | POA: Diagnosis not present

## 2019-03-30 DIAGNOSIS — G3184 Mild cognitive impairment, so stated: Secondary | ICD-10-CM | POA: Diagnosis not present

## 2019-03-30 DIAGNOSIS — F5105 Insomnia due to other mental disorder: Secondary | ICD-10-CM

## 2019-03-30 NOTE — Progress Notes (Signed)
Kaitlyn Hoover [id number] [birth year] 78 y.o.   Virtual Visit via Telephone Note  I connected with pt by telephone and verified that I am speaking with the correct person using two identifiers.   I discussed the limitations, risks, security and privacy concerns of performing an evaluation and management service by telephone and the availability of in person appointments. I also discussed with the patient that there may be a patient responsible charge related to this service. The patient expressed understanding and agreed to proceed.  I discussed the assessment and treatment plan with the patient. The patient was provided an opportunity to ask questions and all were answered. The patient agreed with the plan and demonstrated an understanding of the instructions.   The patient was advised to call back or seek an in-person evaluation if the symptoms worsen or if the condition fails to improve as anticipated.  I provided 15 minutes of non-face-to-face time during this encounter. The call started at 1:30 PM and ended at 1:45 PM. The patient was located at home and the provider was located office.   Subjective:   Patient ID:  Kaitlyn Hoover is a 78 y.o. (DOB 03-May-1941) female.  Chief Complaint:  Chief Complaint  Patient presents with  . Follow-up    Medication Management  . Anxiety    Medication Management  . Depression    Medication Management    Anxiety  Patient reports no confusion, decreased concentration, nervous/anxious behavior or suicidal ideas.    Depression         Associated symptoms include fatigue.  Associated symptoms include no decreased concentration and no suicidal ideas.  Past medical history includes anxiety.    Kaitlyn Hoover presents for follow-up of psych issures  Last seen November 2019 and no meds were changed.  I've been alright with a little anxiety more in the beginning of Covid.  Normal feelings.  Only gets out once weekly to grocery store and  wears mask and is careful with hand sanitizer.  More used to being alone now.  Call and talk with son.  Evening prayer 3 nights weekly with Zoom.  YouTube church service.     Very pleased with the meds still.  Still has some tension with Geodon but better than before the most recent changes. Doing real well with temazepam for sleep.  Satisfied.  Normal hours. Racing thoughts and can't be still.  Can be any subject and not a lot of worry overall.  Wonders about Xanax.  Have had to increase Restoril and it did help.   Patient reports stable mood and denies depressed or irritable moods.  Patient denies any recent difficulty with anxiety.  Patient reports that energy and motivation have been good.  Patient denies any difficulty with concentration.  Patient denies any suicidal ideation.  Has seen benefit from the Aricept definitely within 2-3 weeks felt more organized.  Better productivity.  Better memory.  Past Psychiatric Medication Trials: Olanzapine haloperidol, Abilify, Depakote 250 mg 3 times daily, Latuda, Saphris, Geodon, benztropine, Artane, clonazepam side effects, Tranxene increased appetite, temazepam, Fanapt 2 mg, trazodone, hydroxyzine She has been under the care of this office since 2003.   Review of Systems:  Review of Systems  Constitutional: Positive for fatigue.  Musculoskeletal: Positive for back pain.  Neurological: Positive for facial asymmetry. Negative for tremors and weakness.  Psychiatric/Behavioral: Positive for depression. Negative for agitation, behavioral problems, confusion, decreased concentration, dysphoric mood, hallucinations, self-injury, sleep disturbance and suicidal ideas. The patient is  not nervous/anxious and is not hyperactive.     Medications: I have reviewed the patient's current medications.  Current Outpatient Medications  Medication Sig Dispense Refill  . acyclovir (ZOVIRAX) 400 MG tablet Take 400 mg at bedtime by mouth.     Marland Kitchen alendronate (FOSAMAX)  70 MG tablet Take 70 mg by mouth every Sunday. Take with a full glass of water on an empty stomach.     Marland Kitchen apixaban (ELIQUIS) 5 MG TABS tablet Take 1 tablet (5 mg total) by mouth 2 (two) times daily. 180 tablet 3  . Ascorbic Acid (VITAMIN C) 1000 MG tablet Take 1,000 mg by mouth daily.    Marland Kitchen azelastine (OPTIVAR) 0.05 % ophthalmic solution Place 2 drops into both eyes daily as needed. Inching eyes    . Calcium Citrate-Vitamin D (CALCIUM CITRATE + D PO) Take 2 tablets by mouth at bedtime.     . chlorthalidone (HYGROTON) 25 MG tablet TAKE ONE TABLET BY MOUTH ONE TIME DAILY  90 tablet 2  . Coenzyme Q10 (CO Q-10) 100 MG CAPS Take 100 mg by mouth daily.     Marland Kitchen D-Ribose (RIBOSE, D,) POWD Take 2 g by mouth daily.     Marland Kitchen diltiazem (CARTIA XT) 240 MG 24 hr capsule Take 1 capsule (240 mg total) by mouth daily. 90 capsule 1  . donepezil (ARICEPT) 5 MG tablet Take 1 tablet (5 mg total) by mouth daily. 90 tablet 1  . fexofenadine (ALLEGRA) 180 MG tablet Take 180 mg by mouth daily.    . fluticasone (FLONASE) 50 MCG/ACT nasal spray Place 2 sprays into both nostrils daily.    . Ginkgo Biloba 120 MG TABS Take 120 mg by mouth 2 (two) times daily.     Marland Kitchen iloperidone (FANAPT) 4 MG TABS tablet Take 1 tablet (4 mg total) by mouth at bedtime. (Patient taking differently: Take 4 mg by mouth at bedtime. 1/4 tablets at bedtime) 30 tablet 5  . levothyroxine (SYNTHROID, LEVOTHROID) 100 MCG tablet Take 100 mcg by mouth daily before breakfast.     . Magnesium 200 MG TABS Take 200 mg by mouth every evening.    . Melatonin 3 MG TABS Take 3 mg by mouth at bedtime.    . Multiple Vitamin (MULTIVITAMIN) capsule Take 1 capsule daily by mouth.     . Omega-3 Fatty Acids (FISH OIL BURP-LESS) 1000 MG CAPS Take 3,000 mg by mouth 2 (two) times daily.     Marland Kitchen oxybutynin (DITROPAN-XL) 10 MG 24 hr tablet Take 15 mg by mouth daily.     . rosuvastatin (CRESTOR) 10 MG tablet TAKE ONE TABLET BY MOUTH UP TO 4 TIMES A WEEK ON SUNDAY,MONDAY,WEDNESDAY AND  FRIDAY 30 tablet 8  . sodium chloride (OCEAN) 0.65 % SOLN nasal spray Place 2 sprays into both nostrils every evening.    . temazepam (RESTORIL) 30 MG capsule TAKE ONE CAPSULE BY MOUTH AT BEDTIME  90 capsule 0  . traMADol (ULTRAM) 50 MG tablet Take 1 tablet (50 mg total) by mouth 3 (three) times daily as needed. 90 tablet 3  . ziprasidone (GEODON) 40 MG capsule Take 1 capsule (40 mg total) by mouth daily. 90 capsule 0   No current facility-administered medications for this visit.     Medication Side Effects: Other: EPS prone.  Allergies:  Allergies  Allergen Reactions  . Sulfa Antibiotics Rash and Other (See Comments)    Childhood allergy  . Ciprocinonide [Fluocinolone] Rash  . Ciprofloxacin Rash    Past Medical History:  Diagnosis Date  . Anxiety   . Arthritis   . Atrial fibrillation with RVR ([place])   . Bipolar disorder ([place])   . Dysrhythmia   . GERD (gastroesophageal reflux disease)   . Hypertension   . Hypothyroidism   . Palpitations   . PAT (paroxysmal atrial tachycardia) ([place])   . Post-polio syndrome   . Sinus infection   . 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 Hypertension Mother   . Cancer Father        JAW BONE  . COPD Brother   . Atrial fibrillation Brother     Social History   Socioeconomic History  . Marital status: Divorced    Spouse name: Not on file  . Number of children: Not on file  . Years of education: Not on file  . Highest education level: Not on file  Occupational History  . Not on file  Social Needs  . Financial resource strain: Not on file  . Food insecurity:    Worry: Not on file    Inability: Not on file  . Transportation needs:    Medical: Not on file    Non-medical: Not on file  Tobacco Use  . Smoking status: Former Research scientist (life sciences)  . Smokeless tobacco: Never Used  . Tobacco comment: QUIT SMOKING IN 1998  Substance and Sexual Activity  . Alcohol use: Yes    Comment: GLASS WINE W/  DINNER  . Drug use: No  . Sexual activity: Not on file  Lifestyle  . Physical activity:    Days per week: Not on file    Minutes per session: Not on file  . Stress: Not on file  Relationships  . Social connections:    Talks on phone: Not on file    Gets together: Not on file    Attends religious service: Not on file    Active member of club or organization: Not on file    Attends meetings of clubs or organizations: Not on file    Relationship status: Not on file  . Intimate partner violence:    Fear of current or ex partner: Not on file    Emotionally abused: Not on file    Physically abused: Not on file    Forced sexual activity: Not on file  Other Topics Concern  . Not on file  Social History Narrative  . Not on file    Past Medical History, Surgical history, Social history, and Family history were reviewed and updated as appropriate.   Please see review of systems for further details on the patient's review from today.   Objective:   Physical Exam:  LMP  (LMP Unknown)   Physical Exam Neurological:     Mental Status: She is alert and oriented to person, place, and time.     Cranial Nerves: No dysarthria.  Psychiatric:        Attention and Perception: Attention normal.        Mood and Affect: Mood normal.        Speech: Speech normal.        Behavior: Behavior is agitated. Behavior is not aggressive. Behavior is cooperative.        Thought Content: Thought content normal. Thought content is not paranoid or delusional. Thought content does not include homicidal or suicidal ideation. Thought content does not include homicidal or suicidal plan.        Cognition  and Memory: Cognition and memory normal.        Judgment: Judgment normal.     Comments: History of paranoia in remission   history of paranoia controlled.   Lab Review:     Component Value Date/Time   NA 138 04/01/2018 1748   NA 140 10/07/2017 1129   K 3.7 04/01/2018 1748   CL 100 (L) 04/01/2018 1748    CO2 28 04/01/2018 1748   GLUCOSE 108 (H) 04/01/2018 1748   BUN 20 04/01/2018 1748   BUN 24 10/07/2017 1129   CREATININE 1.13 (H) 04/01/2018 1748   CALCIUM 8.7 (L) 04/01/2018 1748   PROT 7.9 04/02/2017 0946   PROT 7.2 02/12/2017 1013   ALBUMIN 3.2 (L) 04/02/2017 0946   ALBUMIN 4.0 02/12/2017 1013   AST 31 04/02/2017 0946   ALT 15 04/02/2017 0946   ALKPHOS 81 04/02/2017 0946   BILITOT 0.5 04/02/2017 0946   BILITOT 0.4 02/12/2017 1013   GFRNONAA 46 (L) 04/01/2018 1748   GFRAA 53 (L) 04/01/2018 1748       Component Value Date/Time   WBC 5.7 04/01/2018 1748   RBC 3.94 04/01/2018 1748   HGB 12.4 04/01/2018 1748   HGB 12.0 04/11/2017 0911   HCT 36.7 04/01/2018 1748   HCT 36.8 04/11/2017 0911   PLT 220 04/01/2018 1748   PLT 306 04/11/2017 0911   MCV 93.1 04/01/2018 1748   MCV 90 04/11/2017 0911   MCH 31.5 04/01/2018 1748   MCHC 33.8 04/01/2018 1748   RDW 13.1 04/01/2018 1748   RDW 13.4 04/11/2017 0911   LYMPHSABS 1.8 04/11/2017 0911   MONOABS 0.5 04/02/2017 0946   EOSABS 0.2 04/11/2017 0911   BASOSABS 0.1 04/11/2017 0911    No results found for: POCLITH, LITHIUM   No results found for: PHENYTOIN, PHENOBARB, VALPROATE, CBMZ   .res Assessment: Plan:    Schizoaffective disorder, bipolar type ([place])  Insomnia due to other mental disorder  Mild cognitive impairment   EPS prone  Greater than 50% of face to face time with patient was spent on counseling and coordination of care.  K has a long history of repeated episodes of paranoia associated with hypomania at times and at times independent of mood changes.  WE discussed her med-sensitivity.  As noted above she has tried multiple different antipsychotics and different antipsychotic combinations.  Discussed potential metabolic side effects associated with atypical antipsychotics, as well as potential risk for movement side effects. Advised pt to contact office if movement side effects occur.  She has mild dysarthria which  may be related but it is no worse and it stable.  She is very specifically aware of the risk of tardive dyskinesia.  The unusual combination of antipsychotics is helpful in that the [place] actually offsets some of the dystonia that the Geodon tends to cause.  Lower dose of antipsychotics has led to worsening paranoia.  Her paranoia is well controlled at the current combination of medications.  The degree of cognitive impairment is very much on the mild side but she has seen marked improvement with very low dose Aricept.  Given her medication sensitivity we will not try to increase this.  We discussed the short-term risks associated with benzodiazepines including sedation and increased fall risk among others.  Discussed long-term side effect risk including dependence, potential withdrawal symptoms, and the potential eventual dose-related risk of dementia. We have tried to taper her off benzodiazepines for sleep and she was able to be off for a short period  of time but then had recurrence of insomnia.  She is aware of the risks but the benefit for sleep is noteworthy and she is failed multiple other sleep meds  No med changes indicated  Follow-up 6 months  Lynder Parents MD, DFAPA   Please see After Visit Summary for patient specific instructions.  No future appointments.  No orders of the defined types were placed in this encounter.     -------------------------------

## 2019-03-31 ENCOUNTER — Other Ambulatory Visit: Payer: Self-pay

## 2019-03-31 MED ORDER — ILOPERIDONE 4 MG PO TABS: 4.0000 mg | ORAL_TABLET | Freq: Every day | ORAL | 5 refills | Status: DC

## 2019-04-09 DIAGNOSIS — H60332 Swimmer's ear, left ear: Secondary | ICD-10-CM | POA: Diagnosis not present

## 2019-04-13 ENCOUNTER — Other Ambulatory Visit: Payer: Self-pay

## 2019-04-13 ENCOUNTER — Telehealth: Payer: Self-pay | Admitting: Psychiatry

## 2019-04-13 MED ORDER — TEMAZEPAM 30 MG PO CAPS: 30.0000 mg | ORAL_CAPSULE | Freq: Every day | ORAL | 3 refills | Status: DC

## 2019-04-13 NOTE — Telephone Encounter (Signed)
Patient need refill on Temazepem 30 mg, to be [facility] to [facility] on [address] [phone]

## 2019-04-13 NOTE — Telephone Encounter (Signed)
Refill submitted. 

## 2019-04-21 ENCOUNTER — Telehealth: Payer: Self-pay | Admitting: Neurology

## 2019-04-21 MED ORDER — METHYLPREDNISOLONE 4 MG PO TBPK: ORAL_TABLET | ORAL | 0 refills | Status: DC

## 2019-04-21 NOTE — Telephone Encounter (Signed)
Called patient back. She twisted back wrong getting into van. She is agreeable to try medrol dose pack. [facility]. Asked her to call back if sx do not improve or if she develops new sx. She verbalized understanding.

## 2019-04-21 NOTE — Telephone Encounter (Signed)
Pt called in and stated she is having sever back pain and wants to know if she can get a prescription for prednisone

## 2019-04-21 NOTE — Telephone Encounter (Signed)
Ok for a steroid pack

## 2019-04-21 NOTE — Telephone Encounter (Signed)
Dr. Sater- what would you recommend? 

## 2019-04-30 ENCOUNTER — Other Ambulatory Visit: Payer: Self-pay | Admitting: Cardiology

## 2019-04-30 MED ORDER — APIXABAN 5 MG PO TABS: 5.0000 mg | ORAL_TABLET | Freq: Two times a day (BID) | ORAL | 1 refills | Status: DC

## 2019-04-30 NOTE — Telephone Encounter (Signed)
Pt last saw Dr Curt Bears 07/07/18, last labs 02/02/19 Creat 1.07 at [place] [facility], age 78, weight 62.1kg, based on specified criteria pt is on appropriate dosage of Eliquis 5mg  BID.  Will refill rx.

## 2019-04-30 NOTE — Telephone Encounter (Signed)
°*  STAT* If patient is at the pharmacy, call can be transferred to refill team.   1. Which medications need to be refilled? (please list name of each medication and dose if known) Eliquis 5mg  tablet twice a day  2. Which pharmacy/location (including street and city if local pharmacy) is medication to [facility] sent to[id number]  3. Do they need a 30 day or 90 day supply? [place]

## 2019-05-28 ENCOUNTER — Telehealth: Payer: Self-pay | Admitting: Cardiology

## 2019-05-28 NOTE — Telephone Encounter (Signed)
New Message    Patient states she's bringing in [place] form in to be filled out for her medication assistance.

## 2019-05-28 NOTE — Telephone Encounter (Signed)
I s/w Jeani Hawking Via, LPN about Asst form the pt is going to drop off. Jeani Hawking said April Garrison, CMA is working in the office today working with Jeani Hawking and they will help the pt when she drops off the form.

## 2019-06-03 ENCOUNTER — Telehealth: Payer: Self-pay

## 2019-06-03 ENCOUNTER — Other Ambulatory Visit: Payer: Self-pay | Admitting: Cardiology

## 2019-06-03 MED ORDER — APIXABAN 5 MG PO TABS: 5.0000 mg | ORAL_TABLET | Freq: Two times a day (BID) | ORAL | 3 refills | Status: DC

## 2019-06-03 NOTE — Addendum Note (Signed)
Addended by: Derl Barrow on: 06/03/2019 10:32 AM   Modules accepted: Orders

## 2019-06-03 NOTE — Telephone Encounter (Signed)
The Hoover sent her completed Kaitlyn Hoover asst application to the office. We have completed the provider part of the application, printed an Eliquis RX, Dr Marlou Porch has signed both and we faxed all to [employer] Hoover Asst foundation.

## 2019-06-04 NOTE — Telephone Encounter (Signed)
**Note De-Identified Kaitlyn Hoover Obfuscation** We received a letter Bradrick Kamau fax from [employer] stating that the pt submitted an out dated version of their pt asst application and they will not accept it. They are requesting that the pt complete the newest [employer] pt asst application and to resubmit.  I have left a detailed message on the pts VM asking her to contact [employer] (I did leave their phone number in this message) to discuss and to request a new application.

## 2019-06-07 ENCOUNTER — Other Ambulatory Visit: Payer: Self-pay | Admitting: Psychiatry

## 2019-06-08 NOTE — Telephone Encounter (Signed)
Follow up    Patient is calling to advise Kaitlyn Hoover that she will be dropping off the [employer] application today or tomorrow.

## 2019-06-09 NOTE — Telephone Encounter (Signed)
**Note De-Identified  Obfuscation** We have the pts application and will take care of the provider portion of the application and fax into [employer] once completed.

## 2019-06-10 NOTE — Telephone Encounter (Signed)
I have completed the provider portion of the Pt Asst application and had Dr Angelena Form sign (he was DOD) Completed form was faxed to [employer].Marland KitchenMarland Kitchen

## 2019-06-11 NOTE — Telephone Encounter (Signed)
**Note De-Identified  Obfuscation** Letter received from [employer] stating that they have approved the pt for assistance with her Eliquis. Approval good until 11/18/2019  The letter states that they have notified the pt of this approval.

## 2019-06-29 ENCOUNTER — Telehealth: Payer: Self-pay | Admitting: Psychiatry

## 2019-06-29 NOTE — Telephone Encounter (Signed)
Pt called to see if she can get a dose increase on her Donepezil. She said she is having memory loss. Please call in at [facility]. She also wants to increase her Temazepam from 30 tablets to 90.

## 2019-07-05 NOTE — Telephone Encounter (Signed)
Kaitlyn Hoover is calling back to see if Dr. Clovis Pu is going to ok her increase in dose to the next dose of the donepezil.  And will he please correct her refills of the temazepam to #90 for a 90 day supply.  He used to do 90 day but this last time it was changed to just #30.  To prevent to get it refilled so often she wants it to be a 90 day fill.  Please let her know if this is going to be done.  Next appt 09/30/19

## 2019-07-06 DIAGNOSIS — K648 Other hemorrhoids: Secondary | ICD-10-CM | POA: Diagnosis not present

## 2019-07-06 DIAGNOSIS — K644 Residual hemorrhoidal skin tags: Secondary | ICD-10-CM | POA: Diagnosis not present

## 2019-07-06 DIAGNOSIS — R151 Fecal smearing: Secondary | ICD-10-CM | POA: Diagnosis not present

## 2019-07-07 ENCOUNTER — Other Ambulatory Visit: Payer: Self-pay | Admitting: Psychiatry

## 2019-07-07 MED ORDER — TEMAZEPAM 30 MG PO CAPS: 30.0000 mg | ORAL_CAPSULE | Freq: Every day | ORAL | 0 refills | Status: DC

## 2019-07-07 MED ORDER — DONEPEZIL HCL 10 MG PO TABS: 5.0000 mg | ORAL_TABLET | Freq: Every day | ORAL | 1 refills | Status: DC

## 2019-07-07 NOTE — Progress Notes (Signed)
Patient asked to increase Aricept from 5 to 10 mg daily.  She is concerned about her memory.  This is agreed to. She has chronic insomnia.  She is aware of the risk of temazepam affecting that but she has been a unable to get by with lower dosages.  She has weaned off in the past and tried alternative sleep meds without success.  She is never shown any evidence of abuse.  We will okay a 90-day refill.

## 2019-07-07 NOTE — Telephone Encounter (Signed)
Patient asked to increase Aricept from 5 to 10 mg daily.  She is concerned about her memory.  This is agreed to. She has chronic insomnia.  She is aware of the risk of temazepam affecting that but she has been a unable to get by with lower dosages.  She has weaned off in the past and tried alternative sleep meds without success.  She is never shown any evidence of abuse.  We will okay a 90-day refill.  Prescription sent

## 2019-07-07 NOTE — Telephone Encounter (Signed)
Patient aware.

## 2019-07-13 ENCOUNTER — Telehealth: Payer: Self-pay | Admitting: Psychiatry

## 2019-07-13 ENCOUNTER — Other Ambulatory Visit: Payer: Self-pay | Admitting: Psychiatry

## 2019-07-13 MED ORDER — DONEPEZIL HCL 10 MG PO TABS: 10.0000 mg | ORAL_TABLET | Freq: Every day | ORAL | 0 refills | Status: DC

## 2019-07-13 NOTE — Telephone Encounter (Signed)
Mercadez called to report that the donepezil you sent in is not really an increase in dose.  She wanted it increased to 10mg /day but the instruction say to take 1/2 a tab a day which is really still just 5 mg/day.  Please send in corrected prescription of 10mg  1/day.  Send to [facility]

## 2019-07-13 NOTE — Telephone Encounter (Signed)
She is correct.  A corrected prescription was sent to [facility].  Please let her know

## 2019-07-14 NOTE — Telephone Encounter (Signed)
Pt. Made aware and wanted to thank you for correcting this.

## 2019-07-29 ENCOUNTER — Other Ambulatory Visit: Payer: Self-pay | Admitting: Cardiology

## 2019-08-17 DIAGNOSIS — Z23 Encounter for immunization: Secondary | ICD-10-CM | POA: Diagnosis not present

## 2019-08-21 ENCOUNTER — Other Ambulatory Visit: Payer: Self-pay | Admitting: Cardiology

## 2019-09-21 ENCOUNTER — Ambulatory Visit: Payer: PPO | Admitting: Cardiology

## 2019-09-21 ENCOUNTER — Other Ambulatory Visit: Payer: Self-pay

## 2019-09-21 ENCOUNTER — Encounter: Payer: Self-pay | Admitting: Cardiology

## 2019-09-21 VITALS — BP 126/68 | HR 63 | Ht 62.0 in | Wt 139.0 lb

## 2019-09-21 DIAGNOSIS — I480 Paroxysmal atrial fibrillation: Secondary | ICD-10-CM | POA: Diagnosis not present

## 2019-09-21 DIAGNOSIS — Z79899 Other long term (current) drug therapy: Secondary | ICD-10-CM

## 2019-09-21 LAB — CBC
Hematocrit: 39.4 % (ref 34.0–46.6)
Hemoglobin: 12.9 g/dL (ref 11.1–15.9)
MCH: 31.2 pg (ref 26.6–33.0)
MCHC: 32.7 g/dL (ref 31.5–35.7)
MCV: 95 fL (ref 79–97)
Platelets: 228 10*3/uL (ref 150–450)
RBC: 4.13 x10E6/uL (ref 3.77–5.28)
RDW: 12.7 % (ref 11.7–15.4)
WBC: 7.9 10*3/uL (ref 3.4–10.8)

## 2019-09-21 LAB — BASIC METABOLIC PANEL
BUN/Creatinine Ratio: 19 (ref 12–28)
BUN: 21 mg/dL (ref 8–27)
CO2: 25 mmol/L (ref 20–29)
Calcium: 9.5 mg/dL (ref 8.7–10.3)
Chloride: 99 mmol/L (ref 96–106)
Creatinine, Ser: 1.1 mg/dL — ABNORMAL HIGH (ref 0.57–1.00)
GFR calc Af Amer: 56 mL/min/{1.73_m2} — ABNORMAL LOW (ref >59)
GFR calc non Af Amer: 49 mL/min/{1.73_m2} — ABNORMAL LOW (ref >59)
Glucose: 102 mg/dL — ABNORMAL HIGH (ref 65–99)
Potassium: 3.7 mmol/L (ref 3.5–5.2)
Sodium: 140 mmol/L (ref 134–144)

## 2019-09-21 NOTE — Patient Instructions (Signed)
Medication Instructions:  Your physician recommends that you continue on your current medications as directed. Please refer to the Current Medication list given to you today.  * If you need a refill on your cardiac medications before your next appointment, please call your pharmacy.   Labwork: Today: BMET & CBC If you have labs (blood work) drawn today and your tests are completely normal, you will receive your results only by:  [place] (if you have MyChart) OR  A paper copy in the mail If you have any lab test that is abnormal or we need to change your treatment, we will call you to review the results.  Testing/Procedures: None ordered  Follow-Up: At [facility], you and your health needs are our priority.  As part of our continuing mission to provide you with exceptional heart care, we have created designated Provider Care Teams.  These Care Teams include your primary Cardiologist (physician) and Advanced Practice Providers (APPs -  Physician Assistants and Nurse Practitioners) who all work together to provide you with the care you need, when you need it.  You will need a follow up appointment in 1 year.  Please call our office 2 months in advance to schedule this appointment.  You may see Dr Curt Bears or one of the following Advanced Practice Providers on your designated Care Team:    Chanetta Marshall, NP  Tommye Standard, PA-C  Oda Kilts, [place]   Thank you for choosing [facility]!!   Trinidad Curet, RN [phone]

## 2019-09-21 NOTE — Progress Notes (Signed)
Electrophysiology Office Note   Date:  09/21/2019   ID:  [name], DOB 1941-02-20, MRN [id number]  PCP:  [name], MD  Cardiologist:  Marlou Porch Primary Electrophysiologist:  Will Meredith Leeds, MD    No chief complaint on file.    History of Present Illness: Kaitlyn Hoover is a 78 y.o. female who is being seen today for the evaluation of SVT, atrial fibrillation at the request of [name], MD. Presenting today for electrophysiology evaluation. She had multiple episodes of ER visits due to atrial fibrillation. She was initially put on diltiazem 180 mg which has improved her symptoms. Her episodes of SVT have been converted with Valsalva. She had AF ablation 04/25/17 and is presenting for follow up. 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  Today, denies symptoms of palpitations, chest pain, shortness of breath, orthopnea, PND, lower extremity edema, claudication, dizziness, presyncope, syncope, bleeding, or neurologic sequela. The patient is tolerating medications without difficulties.  Overall she is doing well.  She has noted no further episodes of atrial fibrillation.   Past Medical History:  Diagnosis Date  . Anxiety   . Arthritis   . Atrial fibrillation with RVR ([place])   . Bipolar disorder ([place])   . Dysrhythmia   . GERD (gastroesophageal reflux disease)   . Hypertension   . Hypothyroidism   . Palpitations   . PAT (paroxysmal atrial tachycardia) ([place])   . Post-polio syndrome   . Sinus infection   . 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 ABDOMINAL HYSTERECTOMY    . ABLATION OF DYSRHYTHMIC FOCUS  04/25/2017  . ATRIAL FIBRILLATION ABLATION N/A 04/25/2017   Procedure: Atrial Fibrillation Ablation;  Surgeon: Constance Haw, MD;  Location: [facility];  Service: Cardiovascular;  Laterality: N/A;  . BREAST EXCISIONAL BIOPSY Bilateral    benign  . CATARACT EXTRACTION W/ INTRAOCULAR LENS  IMPLANT, BILATERAL    .  CERVICAL LAMINECTOMY    . CESAREAN SECTION     X2   . LEGS      AGE [age]   SEVERAL SURGERIES FOR POLIO   . SVT ABLATION N/A 11/26/2017   Procedure: SVT ABLATION;  Surgeon: Constance Haw, MD;  Location: [facility];  Service: Cardiovascular;  Laterality: N/A;  . THYROIDECTOMY       Current Outpatient Medications  Medication Sig Dispense Refill  . acyclovir (ZOVIRAX) 400 MG tablet Take 400 mg at bedtime by mouth.     Marland Kitchen alendronate (FOSAMAX) 70 MG tablet Take 70 mg by mouth every Sunday. Take with a full glass of water on an empty stomach.     Marland Kitchen apixaban (ELIQUIS) 5 MG TABS tablet Take 1 tablet (5 mg total) by mouth 2 (two) times daily. 180 tablet 3  . Ascorbic Acid (VITAMIN C) 1000 MG tablet Take 1,000 mg by mouth daily.    Marland Kitchen azelastine (OPTIVAR) 0.05 % ophthalmic solution Place 2 drops into both eyes daily as needed. Inching eyes    . Calcium Citrate-Vitamin D (CALCIUM CITRATE + D PO) Take 2 tablets by mouth at bedtime.     . chlorthalidone (HYGROTON) 25 MG tablet TAKE ONE TABLET BY MOUTH ONE TIME DAILY  90 tablet 0  . Coenzyme Q10 (CO Q-10) 100 MG CAPS Take 100 mg by mouth daily.     Marland Kitchen D-Ribose (RIBOSE, D,) POWD Take 2 g by mouth daily.     Marland Kitchen diltiazem (CARTIA  XT) 240 MG 24 hr capsule Take 1 capsule (240 mg total) by mouth daily. Please keep upcoming appt in November with Dr. Curt Bears. Thank you 90 capsule 0  . donepezil (ARICEPT) 10 MG tablet Take 1 tablet (10 mg total) by mouth daily. 90 tablet 0  . fexofenadine (ALLEGRA) 180 MG tablet Take 180 mg by mouth daily.    . fluticasone (FLONASE) 50 MCG/ACT nasal spray Place 2 sprays into both nostrils daily.    . Ginkgo Biloba 120 MG TABS Take 120 mg by mouth 2 (two) times daily.     Marland Kitchen iloperidone (FANAPT) 4 MG TABS tablet Take 1 tablet (4 mg total) by mouth at bedtime. 30 tablet 5  . levothyroxine (SYNTHROID, LEVOTHROID) 100 MCG tablet Take 100 mcg by mouth daily before breakfast.     . Magnesium 200 MG TABS Take 200 mg by mouth  every evening.    . Melatonin 3 MG TABS Take 3 mg by mouth at bedtime.    . Multiple Vitamin (MULTIVITAMIN) capsule Take 1 capsule daily by mouth.     . Omega-3 Fatty Acids (FISH OIL BURP-LESS) 1000 MG CAPS Take 3,000 mg by mouth 2 (two) times daily.     Marland Kitchen oxybutynin (DITROPAN-XL) 10 MG 24 hr tablet Take 15 mg by mouth daily.     . rosuvastatin (CRESTOR) 10 MG tablet TAKE ONE TABLET BY MOUTH UP TO 4 TIMES A WEEK ON SUNDAY,MONDAY,WEDNESDAY AND FRIDAY 30 tablet 8  . sodium chloride (OCEAN) 0.65 % SOLN nasal spray Place 2 sprays into both nostrils every evening.    . temazepam (RESTORIL) 30 MG capsule Take 1 capsule (30 mg total) by mouth at bedtime. 90 capsule 0  . traMADol (ULTRAM) 50 MG tablet Take 1 tablet (50 mg total) by mouth 3 (three) times daily as needed. 90 tablet 3  . ziprasidone (GEODON) 40 MG capsule TAKE 1 CAPSULE BY MOUTH EVERY DAY 90 capsule 1   No current facility-administered medications for this visit.     Allergies:   Sulfa antibiotics, Ciprocinonide [fluocinolone], and Ciprofloxacin   Social History:  The patient  reports that she has quit smoking. She has never used smokeless tobacco. She reports current alcohol use. She reports that she does not use drugs.   Family History:  The patient's family history includes Atrial fibrillation in her brother; COPD in her brother; Cancer in her father; Hypertension in her mother.   ROS:  Please see the history of present illness.   Otherwise, review of systems is positive for none.   All other systems are reviewed and negative.   PHYSICAL EXAM: VS:  BP 126/68   Pulse 63   Ht 5\' 2"  (1.575 m)   Wt 139 lb (63 kg)   LMP  (LMP Unknown)   SpO2 98%   BMI 25.42 kg/m  , BMI Body mass index is 25.42 kg/m. GEN: Well nourished, well developed, in no acute distress  HEENT: normal  Neck: no JVD, carotid bruits, or masses Cardiac: RRR; no murmurs, rubs, or gallops,no edema  Respiratory:  clear to auscultation bilaterally, normal work of  breathing GI: soft, nontender, nondistended, + BS MS: no deformity or atrophy  Skin: warm and dry Neuro:  Strength and sensation are intact Psych: euthymic mood, full affect  EKG:  EKG is ordered today. Personal review of the ekg ordered shows sinus rhythm, PACs, rate 63  Recent Labs: No results found for requested labs within last 8760 hours.    Lipid Panel  Component Value Date/Time   CHOL 136 02/12/2017 1013   TRIG 114 02/12/2017 1013   HDL 46 02/12/2017 1013   CHOLHDL 3.0 02/12/2017 1013   CHOLHDL 3.5 10/22/2016 1039   VLDL 19 10/22/2016 1039   LDLCALC 67 02/12/2017 1013     Wt Readings from Last 3 Encounters:  09/21/19 139 lb (63 kg)  07/07/18 137 lb (62.1 kg)  04/07/18 134 lb 12.8 oz (61.1 kg)      Other studies Reviewed: Additional studies/ records that were reviewed today include: TTE 2015  Review of the above records today demonstrates:  - Left ventricle: The cavity size was normal. Wall thickness was normal. Systolic function was normal. The estimated ejection fraction was in the range of 60% to 65%. Wall motion was normal; there were no regional wall motion abnormalities. - Left atrium: The atrium was mildly dilated. - Atrial septum: No defect or patent foramen ovale was identified. - Pulmonary arteries: PA peak pressure: 32 mm Hg (S).  30 day monitor 01/08/18 - personally reviewed Sinus rhythm with occasional PACs Possible atrial fibrillation with artifact affecting interpretation  ASSESSMENT AND PLAN:  1.  Paroxysmal atrial fibrillation: Currently on Eliquis status post ablation 03/25/2017.  No obvious recurrences.  Will check Eliquis labs today.  This patients CHA2DS2-VASc Score and unadjusted Ischemic Stroke Rate (% per year) is equal to 7.2 % stroke rate/year from a score of 5  Above score calculated as 1 point each if present [CHF, HTN, DM, Vascular=MI/PAD/Aortic Plaque, Age if 65-74, or Female] Above score calculated as 2 points each if  present [Age > 75, or Stroke/TIA/TE]   2. Hypertension: Currently well controlled   3. 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  No obvious recurrences  5.  PACs: Currently on diltiazem feeling well without obvious palpitations.  No changes.  Current medicines are reviewed at length with the patient today.   The patient does not have concerns regarding her medicines.  The following changes were made today: None  Labs/ tests ordered today include:  Orders Placed This Encounter  Procedures  . Basic metabolic panel  . CBC  . EKG 12-Lead     Disposition:   FU with Will Camnitz 12 months  Signed, Will Meredith Leeds, MD  09/21/2019 12:09 PM     [address] [place] [address] [phone] (office) [phone] (fax)

## 2019-09-21 NOTE — Addendum Note (Signed)
Addended by: Marciano Sequin on: 09/21/2019 12:15 PM   Modules accepted: Orders

## 2019-09-30 ENCOUNTER — Encounter: Payer: Self-pay | Admitting: Psychiatry

## 2019-09-30 ENCOUNTER — Other Ambulatory Visit: Payer: Self-pay

## 2019-09-30 ENCOUNTER — Ambulatory Visit (INDEPENDENT_AMBULATORY_CARE_PROVIDER_SITE_OTHER): Payer: PPO | Admitting: Psychiatry

## 2019-09-30 DIAGNOSIS — G3184 Mild cognitive impairment, so stated: Secondary | ICD-10-CM | POA: Diagnosis not present

## 2019-09-30 DIAGNOSIS — F250 Schizoaffective disorder, bipolar type: Secondary | ICD-10-CM | POA: Diagnosis not present

## 2019-09-30 DIAGNOSIS — F5105 Insomnia due to other mental disorder: Secondary | ICD-10-CM | POA: Diagnosis not present

## 2019-09-30 DIAGNOSIS — F99 Mental disorder, not otherwise specified: Secondary | ICD-10-CM

## 2019-09-30 NOTE — Progress Notes (Signed)
Kaitlyn Hoover [id number] 09-Sep-1941 78 y.o.     Subjective:   Patient ID:  Kaitlyn Hoover is a 78 y.o. (DOB 07/06/1941) female.  Chief Complaint:  Chief Complaint  Patient presents with  . Follow-up    Medication Management  . Other    Schizoaffective disorder, bipolar type    Anxiety Patient reports no confusion, decreased concentration, nervous/anxious behavior or suicidal ideas.    Depression        Associated symptoms include no decreased concentration, no fatigue and no suicidal ideas.  Past medical history includes anxiety.    Kaitlyn Hoover presents for follow-up of psych issures  Last seen May 2020  and no meds were changed.  She called in August wanting to increase donepezil from 5 to 10 mg daily.  That was done.  Doing OK overall.  Bored.  Not depressed.  Sleeping well with temazepam 30 mg with 8-9 hours.  Doing Zoom with church and looks forward to it.  Otherwise watching TV. Tolerated Aricept and has seen benefit for recall.  No paranoia or fear.    Call and talk with son.  Evening prayer 3 nights weekly with Zoom.  YouTube church service.  Geodon and Fanapt combo almost perfect with mild clenching..  Still has some tension with Geodon but better than before the most recent changes. Doing real well with temazepam for sleep.  Satisfied.  Normal hours. Racing thoughts and can't be still.  Can be any subject and not a lot of worry overall.  Wonders about Xanax.  Have had to increase Restoril and it did help.   Patient reports stable mood and denies depressed or irritable moods.  Patient denies any recent difficulty with anxiety.  Patient reports that energy and motivation have been good.  Patient denies any difficulty with concentration.  Patient denies any suicidal ideation.  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  Past Psychiatric Medication Trials: Olanzapine haloperidol, Abilify, Depakote 250 mg 3 times daily, Latuda, Saphris, Geodon, benztropine,  Artane, clonazepam side effects, Tranxene increased appetite, temazepam, Fanapt 2 mg, trazodone, hydroxyzine She has been under the care of this office since 2003.   Review of Systems:  Review of Systems  Constitutional: Negative for fatigue.  Musculoskeletal: Positive for back pain.  Neurological: Positive for facial asymmetry and weakness. Negative for tremors.  Psychiatric/Behavioral: Positive for depression. Negative for agitation, behavioral problems, confusion, decreased concentration, dysphoric mood, hallucinations, self-injury, sleep disturbance and suicidal ideas. The patient is not nervous/anxious and is not hyperactive.     Medications: I have reviewed the patient's current medications.  Current Outpatient Medications  Medication Sig Dispense Refill  . acyclovir (ZOVIRAX) 400 MG tablet Take 400 mg at bedtime by mouth.     Marland Kitchen alendronate (FOSAMAX) 70 MG tablet Take 70 mg by mouth every Sunday. Take with a full glass of water on an empty stomach.     Marland Kitchen apixaban (ELIQUIS) 5 MG TABS tablet Take 1 tablet (5 mg total) by mouth 2 (two) times daily. 180 tablet 3  . Ascorbic Acid (VITAMIN C) 1000 MG tablet Take 1,000 mg by mouth daily.    Marland Kitchen azelastine (OPTIVAR) 0.05 % ophthalmic solution Place 2 drops into both eyes daily as needed. Inching eyes    . Calcium Citrate-Vitamin D (CALCIUM CITRATE + D PO) Take 2 tablets by mouth at bedtime.     . chlorthalidone (HYGROTON) 25 MG tablet TAKE ONE TABLET BY MOUTH ONE TIME DAILY  90 tablet 0  .  Coenzyme Q10 (CO Q-10) 100 MG CAPS Take 100 mg by mouth daily.     Marland Kitchen D-Ribose (RIBOSE, D,) POWD Take 2 g by mouth daily.     Marland Kitchen diltiazem (CARTIA XT) 240 MG 24 hr capsule Take 1 capsule (240 mg total) by mouth daily. Please keep upcoming appt in November with Dr. Curt Bears. Thank you 90 capsule 0  . donepezil (ARICEPT) 10 MG tablet Take 1 tablet (10 mg total) by mouth daily. 90 tablet 0  . fexofenadine (ALLEGRA) 180 MG tablet Take 180 mg by mouth daily.    .  fluticasone (FLONASE) 50 MCG/ACT nasal spray Place 2 sprays into both nostrils daily.    . Ginkgo Biloba 120 MG TABS Take 120 mg by mouth daily.     Marland Kitchen iloperidone (FANAPT) 4 MG TABS tablet Take 1 tablet (4 mg total) by mouth at bedtime. (Patient taking differently: Take 1 mg by mouth at bedtime. ) 30 tablet 5  . levothyroxine (SYNTHROID, LEVOTHROID) 100 MCG tablet Take 100 mcg by mouth daily before breakfast.     . Magnesium 200 MG TABS Take 200 mg by mouth every evening.    . Melatonin 3 MG TABS Take 3 mg by mouth at bedtime.    . Multiple Vitamin (MULTIVITAMIN) capsule Take 1 capsule daily by mouth.     . Omega-3 Fatty Acids (FISH OIL BURP-LESS) 1000 MG CAPS Take 3,000 mg by mouth 2 (two) times daily.     Marland Kitchen oxybutynin (DITROPAN-XL) 10 MG 24 hr tablet Take 15 mg by mouth daily.     . rosuvastatin (CRESTOR) 10 MG tablet TAKE ONE TABLET BY MOUTH UP TO 4 TIMES A WEEK ON SUNDAY,MONDAY,WEDNESDAY AND FRIDAY 30 tablet 8  . sodium chloride (OCEAN) 0.65 % SOLN nasal spray Place 2 sprays into both nostrils every evening.    . temazepam (RESTORIL) 30 MG capsule Take 1 capsule (30 mg total) by mouth at bedtime. 90 capsule 0  . traMADol (ULTRAM) 50 MG tablet Take 1 tablet (50 mg total) by mouth 3 (three) times daily as needed. 90 tablet 3  . ziprasidone (GEODON) 40 MG capsule TAKE 1 CAPSULE BY MOUTH EVERY DAY 90 capsule 1   No current facility-administered medications for this visit.     Medication Side Effects: Other: EPS prone.  Allergies:  Allergies  Allergen Reactions  . Sulfa Antibiotics Rash and Other (See Comments)    Childhood allergy  . Ciprocinonide [Fluocinolone] Rash  . Ciprofloxacin Rash    Past Medical History:  Diagnosis Date  . Anxiety   . Arthritis   . Atrial fibrillation with RVR ([place])   . Bipolar disorder ([place])   . Dysrhythmia   . GERD (gastroesophageal reflux disease)   . Hypertension   . Hypothyroidism   . Palpitations   . PAT (paroxysmal atrial tachycardia) ([place])    . Post-polio syndrome   . Sinus infection   . 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 Hypertension Mother   . Cancer Father        JAW BONE  . COPD Brother   . Atrial fibrillation Brother     Social History   Socioeconomic History  . Marital status: Divorced    Spouse name: Not on file  . Number of children: Not on file  . Years of education: Not on file  . Highest education level: Not on file  Occupational History  . Not  on file  Social Needs  . Financial resource strain: Not on file  . Food insecurity    Worry: Not on file    Inability: Not on file  . Transportation needs    Medical: Not on file    Non-medical: Not on file  Tobacco Use  . Smoking status: Former Research scientist (life sciences)  . Smokeless tobacco: Never Used  . Tobacco comment: QUIT SMOKING IN 1998  Substance and Sexual Activity  . Alcohol use: Yes    Comment: GLASS WINE W/ DINNER  . Drug use: No  . Sexual activity: Not on file  Lifestyle  . Physical activity    Days per week: Not on file    Minutes per session: Not on file  . Stress: Not on file  Relationships  . Social Herbalist on phone: Not on file    Gets together: Not on file    Attends religious service: Not on file    Active member of club or organization: Not on file    Attends meetings of clubs or organizations: Not on file    Relationship status: Not on file  . Intimate partner violence    Fear of current or ex partner: Not on file    Emotionally abused: Not on file    Physically abused: Not on file    Forced sexual activity: Not on file  Other Topics Concern  . Not on file  Social History Narrative  . Not on file    Past Medical History, Surgical history, Social history, and Family history were reviewed and updated as appropriate.   Please see review of systems for further details on the patient's review from today.   Objective:   Physical Exam:  LMP  (LMP Unknown)   Physical  Exam Neurological:     Mental Status: She is alert and oriented to person, place, and time.     Cranial Nerves: No dysarthria.  Psychiatric:        Attention and Perception: Attention normal.        Mood and Affect: Mood normal. Mood is not anxious or depressed.        Speech: Speech normal.        Behavior: Behavior is not agitated or aggressive. Behavior is cooperative.        Thought Content: Thought content normal. Thought content is not paranoid or delusional. Thought content does not include homicidal or suicidal ideation. Thought content does not include homicidal or suicidal plan.        Cognition and Memory: Cognition and memory normal.        Judgment: Judgment normal.     Comments: History of paranoia in remission   history of paranoia controlled.   Lab Review:     Component Value Date/Time   NA 140 09/21/2019 1215   K 3.7 09/21/2019 1215   CL 99 09/21/2019 1215   CO2 25 09/21/2019 1215   GLUCOSE 102 (H) 09/21/2019 1215   GLUCOSE 108 (H) 04/01/2018 1748   BUN 21 09/21/2019 1215   CREATININE 1.10 (H) 09/21/2019 1215   CALCIUM 9.5 09/21/2019 1215   PROT 7.9 04/02/2017 0946   PROT 7.2 02/12/2017 1013   ALBUMIN 3.2 (L) 04/02/2017 0946   ALBUMIN 4.0 02/12/2017 1013   AST 31 04/02/2017 0946   ALT 15 04/02/2017 0946   ALKPHOS 81 04/02/2017 0946   BILITOT 0.5 04/02/2017 0946   BILITOT 0.4 02/12/2017 1013   GFRNONAA 49 (L) 09/21/2019 1215  GFRAA 56 (L) 09/21/2019 1215       Component Value Date/Time   WBC 7.9 09/21/2019 1215   WBC 5.7 04/01/2018 1748   RBC 4.13 09/21/2019 1215   RBC 3.94 04/01/2018 1748   HGB 12.9 09/21/2019 1215   HCT 39.4 09/21/2019 1215   PLT 228 09/21/2019 1215   MCV 95 09/21/2019 1215   MCH 31.2 09/21/2019 1215   MCH 31.5 04/01/2018 1748   MCHC 32.7 09/21/2019 1215   MCHC 33.8 04/01/2018 1748   RDW 12.7 09/21/2019 1215   LYMPHSABS 1.8 04/11/2017 0911   MONOABS 0.5 04/02/2017 0946   EOSABS 0.2 04/11/2017 0911   BASOSABS 0.1  04/11/2017 0911    No results found for: POCLITH, LITHIUM   No results found for: PHENYTOIN, PHENOBARB, VALPROATE, CBMZ   .res Assessment: Plan:    Schizoaffective disorder, bipolar type ([place])  Insomnia due to other mental disorder  Mild cognitive impairment   EPS prone  Greater than 50% of face to face time with patient was spent on counseling and coordination of care.  K has a long history of repeated episodes of paranoia associated with hypomania at times and at times independent of mood changes.  WE discussed her med-sensitivity.  As noted above she has tried multiple different antipsychotics and different antipsychotic combinations.  Discussed potential metabolic side effects associated with atypical antipsychotics, as well as potential risk for movement side effects. Advised pt to contact office if movement side effects occur.  She has mild dysarthria which may be related but it is no worse and it stable.  She is very specifically aware of the risk of tardive dyskinesia.   The unusual combination of antipsychotics is helpful in that the [place] actually offsets some of the dystonia that the Geodon tends to cause.  Lower dose of antipsychotics has led to worsening paranoia.  Her paranoia is well controlled at the current combination of medications.  Aricept 10 helped memory.  No SE.  We discussed the short-term risks associated with benzodiazepines including sedation and increased fall risk among others.  Discussed long-term side effect risk including dependence, potential withdrawal symptoms, and the potential eventual dose-related risk of dementia. We have tried to taper her off benzodiazepines for sleep and she was able to be off for a short period of time but then had recurrence of insomnia.  She is aware of the risks but the benefit for sleep is noteworthy and she is failed multiple other sleep meds  No med changes indicated  Follow-up 6 months  Lynder Parents MD,  DFAPA   Please see After Visit Summary for patient specific instructions.  No future appointments.  No orders of the defined types were placed in this encounter.     -------------------------------

## 2019-10-05 ENCOUNTER — Other Ambulatory Visit: Payer: Self-pay

## 2019-10-05 ENCOUNTER — Encounter (INDEPENDENT_AMBULATORY_CARE_PROVIDER_SITE_OTHER): Payer: Self-pay | Admitting: Otolaryngology

## 2019-10-05 ENCOUNTER — Ambulatory Visit (INDEPENDENT_AMBULATORY_CARE_PROVIDER_SITE_OTHER): Payer: PPO | Admitting: Otolaryngology

## 2019-10-05 VITALS — Temp 97.2°F

## 2019-10-05 DIAGNOSIS — H6123 Impacted cerumen, bilateral: Secondary | ICD-10-CM

## 2019-10-05 DIAGNOSIS — H60331 Swimmer's ear, right ear: Secondary | ICD-10-CM

## 2019-10-05 NOTE — Progress Notes (Signed)
HPI: Kaitlyn Hoover is a 78 y.o. female who presents is referred by [facility] for evaluation of right ear drainage. One week ago, patient had right ear flushed out due to wax build up by [facility]. Following cleaning, patient started experiencing right ear drainage. It was very heavy at first and started blocking up her hearing aid. She then went back to [facility] who told her she likely had an ear infection. She wears bilateral hearing aids. She denies any ear pain. She does endorse decreased hearing. No further complaints today.  Past Medical History:  Diagnosis Date  . Anxiety   . Arthritis   . Atrial fibrillation with RVR ([place])   . Bipolar disorder ([place])   . Dysrhythmia   . GERD (gastroesophageal reflux disease)   . Hypertension   . Hypothyroidism   . Palpitations   . PAT (paroxysmal atrial tachycardia) ([place])   . Post-polio syndrome   . Sinus infection   . 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 ABDOMINAL HYSTERECTOMY    . ABLATION OF DYSRHYTHMIC FOCUS  04/25/2017  . ATRIAL FIBRILLATION ABLATION N/A 04/25/2017   Procedure: Atrial Fibrillation Ablation;  Surgeon: Constance Haw, MD;  Location: [facility];  Service: Cardiovascular;  Laterality: N/A;  . BREAST EXCISIONAL BIOPSY Bilateral    benign  . CATARACT EXTRACTION W/ INTRAOCULAR LENS  IMPLANT, BILATERAL    . CERVICAL LAMINECTOMY    . CESAREAN SECTION     X2   . LEGS      AGE [age]   SEVERAL SURGERIES FOR POLIO   . SVT ABLATION N/A 11/26/2017   Procedure: SVT ABLATION;  Surgeon: Constance Haw, MD;  Location: [facility];  Service: Cardiovascular;  Laterality: N/A;  . THYROIDECTOMY     Social History   Socioeconomic History  . Marital status: Divorced    Spouse name: Not on file  . Number of children: Not on file  . Years of education: Not on file  . Highest education level: Not on file  Occupational History  . Not on  file  Social Needs  . Financial resource strain: Not on file  . Food insecurity    Worry: Not on file    Inability: Not on file  . Transportation needs    Medical: Not on file    Non-medical: Not on file  Tobacco Use  . Smoking status: Former Smoker    Packs/day: 1.00    Years: 25.00    Pack years: 25.00    Start date: 53    Quit date: 1998    Years since quitting: 22.8  . Smokeless tobacco: Never Used  . Tobacco comment: QUIT SMOKING IN 1998  Substance and Sexual Activity  . Alcohol use: Yes    Comment: GLASS WINE W/ DINNER  . Drug use: No  . Sexual activity: Not on file  Lifestyle  . Physical activity    Days per week: Not on file    Minutes per session: Not on file  . Stress: Not on file  Relationships  . Social Herbalist on phone: Not on file    Gets together: Not on file    Attends religious service: Not on file    Active member of club or organization: Not on file    Attends meetings of clubs or organizations: Not on file    Relationship status: Not  on file  Other Topics Concern  . Not on file  Social History Narrative  . Not on file   Family History  Problem Relation Age of Onset  . Hypertension Mother   . Cancer Father        JAW BONE  . COPD Brother   . Atrial fibrillation Brother    Allergies  Allergen Reactions  . Sulfa Antibiotics Rash and Other (See Comments)    Childhood allergy  . Ciprocinonide [Fluocinolone] Rash  . Ciprofloxacin Rash   Prior to Admission medications   Medication Sig Start Date End Date Taking? Authorizing Provider  acyclovir (ZOVIRAX) 400 MG tablet Take 400 mg at bedtime by mouth.  02/05/14  Yes [provider]  alendronate (FOSAMAX) 70 MG tablet Take 70 mg by mouth every Sunday. Take with a full glass of water on an empty stomach.    Yes [provider]  apixaban (ELIQUIS) 5 MG TABS tablet Take 1 tablet (5 mg total) by mouth 2 (two) times daily. 06/03/19  Yes Jerline Pain, MD  Ascorbic Acid  (VITAMIN C) 1000 MG tablet Take 1,000 mg by mouth daily.   Yes [provider]  azelastine (OPTIVAR) 0.05 % ophthalmic solution Place 2 drops into both eyes daily as needed. Inching eyes 04/02/18  Yes [provider]  Calcium Citrate-Vitamin D (CALCIUM CITRATE + D PO) Take 2 tablets by mouth at bedtime.    Yes [provider]  chlorthalidone (HYGROTON) 25 MG tablet TAKE ONE TABLET BY MOUTH ONE TIME DAILY  07/29/19  Yes Camnitz, Will Hassell Done, MD  Coenzyme Q10 (CO Q-10) 100 MG CAPS Take 100 mg by mouth daily.    Yes [provider]  D-Ribose (RIBOSE, D,) POWD Take 2 g by mouth daily.    Yes [provider]  diltiazem (CARTIA XT) 240 MG 24 hr capsule Take 1 capsule (240 mg total) by mouth daily. Please keep upcoming appt in November with Dr. Curt Bears. Thank you 08/23/19  Yes Camnitz, Will Hassell Done, MD  donepezil (ARICEPT) 10 MG tablet Take 1 tablet (10 mg total) by mouth daily. 07/13/19  Yes Cottle, Billey Co., MD  fexofenadine (ALLEGRA) 180 MG tablet Take 180 mg by mouth daily.   Yes [provider]  fluticasone (FLONASE) 50 MCG/ACT nasal spray Place 2 sprays into both nostrils daily.   Yes [provider]  Ginkgo Biloba 120 MG TABS Take 120 mg by mouth daily.    Yes [provider]  iloperidone (FANAPT) 4 MG TABS tablet Take 1 tablet (4 mg total) by mouth at bedtime. Patient taking differently: Take 1 mg by mouth at bedtime.  03/31/19  Yes Cottle, Billey Co., MD  levothyroxine (SYNTHROID, LEVOTHROID) 100 MCG tablet Take 100 mcg by mouth daily before breakfast.  02/16/14  Yes [provider]  Magnesium 200 MG TABS Take 200 mg by mouth every evening.   Yes [provider]  Melatonin 3 MG TABS Take 3 mg by mouth at bedtime.   Yes [provider]  Multiple Vitamin (MULTIVITAMIN) capsule Take 1 capsule daily by mouth.    Yes [provider]  Omega-3 Fatty Acids (FISH OIL BURP-LESS) 1000 MG CAPS Take 3,000 mg  by mouth 2 (two) times daily.    Yes [provider]  oxybutynin (DITROPAN-XL) 10 MG 24 hr tablet Take 15 mg by mouth daily.    Yes [provider]  rosuvastatin (CRESTOR) 10 MG tablet TAKE ONE TABLET BY MOUTH UP TO  4 TIMES A WEEK ON SUNDAY,MONDAY,WEDNESDAY AND FRIDAY 11/24/18  Yes Jerline Pain, MD  sodium chloride (OCEAN) 0.65 % SOLN nasal spray Place 2 sprays into both nostrils every evening.   Yes [provider]  temazepam (RESTORIL) 30 MG capsule Take 1 capsule (30 mg total) by mouth at bedtime. 07/07/19  Yes Cottle, Billey Co., MD  traMADol (ULTRAM) 50 MG tablet Take 1 tablet (50 mg total) by mouth 3 (three) times daily as needed. 03/24/19  Yes Sater, Nanine Means, MD  ziprasidone (GEODON) 40 MG capsule TAKE 1 CAPSULE BY MOUTH EVERY DAY 06/07/19  Yes Cottle, Billey Co., MD     Positive ROS: positive for ear drainage; otherwise negative  All other systems have been reviewed and were otherwise negative with the exception of those mentioned in the HPI and as above.  Physical Exam: Constitutional: Alert, well-appearing, no acute distress Ears: External ears without lesions or tenderness. Left ear canal with mild wax which was cleaned revealing a clear EAC and clear, intact TM. Right ear canal is moderately inflamed and completely covered in purulent drainage. After removal, right ear canal is inflamed, red with a clear, intact TM. Nasal: Clear nasal passages. Neck: No palpable adenopathy or masses Respiratory: Breathing comfortably  Skin: No facial/neck lesions or rash noted.  Cerumen impaction removal  Date/Time: 10/05/2019 4:17 PM Performed by: Glean Hess, Christin M, PA-C Authorized by: Rozetta Nunnery, MD   Consent:    Consent obtained:  Verbal   Consent given by:  Patient   Risks discussed:  Pain   Alternatives discussed:  No treatment Procedure details:    Location:  L ear and R ear   Procedure type: suction and forceps   Post-procedure details:     Inspection:  TM intact (right EAC inflamed and erythematous; left EAC clear)   Hearing quality:  Normal   Patient tolerance of procedure:  Tolerated well, no immediate complications    Assessment: Cerumen impaction Otitis externa, right ear  SNHL  Plan: Ear cleaned in office. [employer] in office. Provided patient with prescription for Cortisporin ear drops, 4-5 drops TID x5 days PRN drainage. She will call if symptoms do not improve. Recommend she continue to keep hearing aid out of right ear. She will follow up PRN.  Christin Hoffstadt, PA-C  I have personally seen and examined this patient. I agree with the assessment and plan as outlined above. Radene Journey, MD

## 2019-10-11 ENCOUNTER — Other Ambulatory Visit: Payer: Self-pay | Admitting: Psychiatry

## 2019-11-03 ENCOUNTER — Other Ambulatory Visit: Payer: Self-pay | Admitting: Family Medicine

## 2019-11-03 DIAGNOSIS — Z1231 Encounter for screening mammogram for malignant neoplasm of breast: Secondary | ICD-10-CM

## 2019-11-05 ENCOUNTER — Other Ambulatory Visit: Payer: Self-pay | Admitting: Cardiology

## 2019-11-13 ENCOUNTER — Other Ambulatory Visit: Payer: Self-pay | Admitting: Psychiatry

## 2019-11-20 ENCOUNTER — Other Ambulatory Visit: Payer: Self-pay | Admitting: Cardiology

## 2019-12-01 ENCOUNTER — Emergency Department (HOSPITAL_COMMUNITY)
Admission: EM | Admit: 2019-12-01 | Discharge: 2019-12-01 | Disposition: A | Discharge status: Home / Self Care | Payer: Medicare Other | Attending: Emergency Medicine | Admitting: Emergency Medicine

## 2019-12-01 ENCOUNTER — Ambulatory Visit: Payer: Medicare Other

## 2019-12-01 ENCOUNTER — Other Ambulatory Visit: Payer: Self-pay

## 2019-12-01 ENCOUNTER — Encounter (HOSPITAL_COMMUNITY): Payer: Self-pay

## 2019-12-01 DIAGNOSIS — T17228A Food in pharynx causing other injury, initial encounter: Secondary | ICD-10-CM | POA: Diagnosis not present

## 2019-12-01 DIAGNOSIS — Z23 Encounter for immunization: Secondary | ICD-10-CM | POA: Insufficient documentation

## 2019-12-01 DIAGNOSIS — X58XXXA Exposure to other specified factors, initial encounter: Secondary | ICD-10-CM | POA: Insufficient documentation

## 2019-12-01 DIAGNOSIS — Y929 Unspecified place or not applicable: Secondary | ICD-10-CM | POA: Insufficient documentation

## 2019-12-01 DIAGNOSIS — Y939 Activity, unspecified: Secondary | ICD-10-CM | POA: Insufficient documentation

## 2019-12-01 DIAGNOSIS — Y999 Unspecified external cause status: Secondary | ICD-10-CM | POA: Diagnosis not present

## 2019-12-01 DIAGNOSIS — Z5321 Procedure and treatment not carried out due to patient leaving prior to being seen by health care provider: Secondary | ICD-10-CM | POA: Diagnosis not present

## 2019-12-01 NOTE — ED Triage Notes (Addendum)
Pt arrived POV ambulatory into ED CC "Rice stuck in throat". Pt airways is clear no signs of distress. Pt reports not being able to swallow water. No drooling noted an dpt confirms voice is baseline. Pt reports eating rice at 1430 today and choking on it. Pt denies anaphylactic type reaction.  VSS afebrile

## 2019-12-01 NOTE — Progress Notes (Signed)
   Covid-19 Vaccination Clinic  Name:  Kaitlyn Hoover    MRN: [id number] DOB: 05-28-1941  12/01/2019  Kaitlyn Hoover was observed post Covid-19 immunization for 15 minutes without incidence. She was provided with Vaccine Information Sheet and instruction to access the V-Safe system.   Kaitlyn Hoover was instructed to call 911 with any severe reactions post vaccine: Marland Kitchen Difficulty breathing  . Swelling of your face and throat  . A fast heartbeat  . A bad rash all over your body  . Dizziness and weakness    Immunizations Administered    Name Date Dose VIS Date Route   Pfizer COVID-19 Vaccine 12/01/2019 11:57 AM 0.3 mL 10/29/2019 Intramuscular   Manufacturer: [place]   Lot: [id number]   [place]: [id number]

## 2019-12-12 ENCOUNTER — Other Ambulatory Visit: Payer: Self-pay | Admitting: Psychiatry

## 2019-12-12 ENCOUNTER — Other Ambulatory Visit: Payer: Self-pay | Admitting: Cardiology

## 2019-12-17 ENCOUNTER — Ambulatory Visit: Payer: Medicare Other | Attending: Internal Medicine

## 2019-12-17 DIAGNOSIS — Z20822 Contact with and (suspected) exposure to covid-19: Secondary | ICD-10-CM

## 2019-12-18 LAB — NOVEL CORONAVIRUS, NAA: SARS-CoV-2, NAA: NOT DETECTED

## 2019-12-21 ENCOUNTER — Ambulatory Visit: Payer: PPO

## 2019-12-21 ENCOUNTER — Ambulatory Visit: Payer: Medicare Other | Attending: Internal Medicine

## 2019-12-21 DIAGNOSIS — Z23 Encounter for immunization: Secondary | ICD-10-CM | POA: Insufficient documentation

## 2019-12-21 NOTE — Progress Notes (Addendum)
   Covid-19 Vaccination Clinic  Name:  Kaitlyn Hoover    MRN: [id number] DOB: 09-27-1941  12/21/2019  [name] was observed post Covid-19 immunization for 15 minutes without incidence. She was provided with Vaccine Information Sheet and instruction to access the V-Safe system.   [name] was instructed to call 911 with any severe reactions post vaccine: Marland Kitchen Difficulty breathing  . Swelling of your face and throat  . A fast heartbeat  . A bad rash all over your body  . Dizziness and weakness    Immunizations Administered    Name Date Dose VIS Date Route   Pfizer COVID-19 Vaccine 12/21/2019 11:42 AM 0.3 mL 10/29/2019 Intramuscular   Manufacturer: [place]   Lot: [id number]   [place]: [id number]

## 2019-12-22 ENCOUNTER — Other Ambulatory Visit: Payer: Self-pay

## 2019-12-22 ENCOUNTER — Ambulatory Visit
Admission: RE | Admit: 2019-12-22 | Discharge: 2019-12-22 | Disposition: A | Discharge status: Home / Self Care | Payer: Medicare Other | Source: Ambulatory Visit | Attending: Family Medicine | Admitting: Family Medicine

## 2019-12-22 DIAGNOSIS — Z1231 Encounter for screening mammogram for malignant neoplasm of breast: Secondary | ICD-10-CM | POA: Diagnosis not present

## 2019-12-30 DIAGNOSIS — H35372 Puckering of macula, left eye: Secondary | ICD-10-CM | POA: Diagnosis not present

## 2019-12-30 DIAGNOSIS — H5213 Myopia, bilateral: Secondary | ICD-10-CM | POA: Diagnosis not present

## 2020-01-08 ENCOUNTER — Other Ambulatory Visit: Payer: Self-pay | Admitting: Psychiatry

## 2020-01-09 NOTE — Telephone Encounter (Signed)
Due back 03/28/2020

## 2020-02-22 DIAGNOSIS — N183 Chronic kidney disease, stage 3 unspecified: Secondary | ICD-10-CM | POA: Diagnosis not present

## 2020-02-22 DIAGNOSIS — Z0000 Encounter for general adult medical examination without abnormal findings: Secondary | ICD-10-CM | POA: Diagnosis not present

## 2020-02-22 DIAGNOSIS — I10 Essential (primary) hypertension: Secondary | ICD-10-CM | POA: Diagnosis not present

## 2020-02-22 DIAGNOSIS — E785 Hyperlipidemia, unspecified: Secondary | ICD-10-CM | POA: Diagnosis not present

## 2020-02-22 DIAGNOSIS — I739 Peripheral vascular disease, unspecified: Secondary | ICD-10-CM | POA: Diagnosis not present

## 2020-02-22 DIAGNOSIS — I4891 Unspecified atrial fibrillation: Secondary | ICD-10-CM | POA: Diagnosis not present

## 2020-02-22 DIAGNOSIS — F319 Bipolar disorder, unspecified: Secondary | ICD-10-CM | POA: Diagnosis not present

## 2020-02-22 DIAGNOSIS — E039 Hypothyroidism, unspecified: Secondary | ICD-10-CM | POA: Diagnosis not present

## 2020-02-22 DIAGNOSIS — A809 Acute poliomyelitis, unspecified: Secondary | ICD-10-CM | POA: Diagnosis not present

## 2020-02-23 ENCOUNTER — Other Ambulatory Visit: Payer: Self-pay

## 2020-02-23 ENCOUNTER — Encounter (INDEPENDENT_AMBULATORY_CARE_PROVIDER_SITE_OTHER): Payer: Self-pay | Admitting: Otolaryngology

## 2020-02-23 ENCOUNTER — Ambulatory Visit (INDEPENDENT_AMBULATORY_CARE_PROVIDER_SITE_OTHER): Payer: PPO | Admitting: Otolaryngology

## 2020-02-23 VITALS — Temp 97.2°F

## 2020-02-23 DIAGNOSIS — H60312 Diffuse otitis externa, left ear: Secondary | ICD-10-CM | POA: Diagnosis not present

## 2020-02-23 DIAGNOSIS — H6123 Impacted cerumen, bilateral: Secondary | ICD-10-CM | POA: Diagnosis not present

## 2020-02-23 NOTE — Progress Notes (Signed)
HPI: Kaitlyn Hoover is a 79 y.o. female who presents for evaluation of left ear pain.  She has been having some drainage and discomfort in the left ear for the past few days.  She wears hearing aids..  Past Medical History:  Diagnosis Date  . Anxiety   . Arthritis   . Atrial fibrillation with RVR ([place])   . Bipolar disorder ([place])   . Dysrhythmia   . GERD (gastroesophageal reflux disease)   . Hypertension   . Hypothyroidism   . Palpitations   . PAT (paroxysmal atrial tachycardia) ([place])   . Post-polio syndrome   . Sinus infection   . 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 ABDOMINAL HYSTERECTOMY    . ABLATION OF DYSRHYTHMIC FOCUS  04/25/2017  . ATRIAL FIBRILLATION ABLATION N/A 04/25/2017   Procedure: Atrial Fibrillation Ablation;  Surgeon: Constance Haw, MD;  Location: [facility];  Service: Cardiovascular;  Laterality: N/A;  . BREAST EXCISIONAL BIOPSY Bilateral    benign  . CATARACT EXTRACTION W/ INTRAOCULAR LENS  IMPLANT, BILATERAL    . CERVICAL LAMINECTOMY    . CESAREAN SECTION     X2   . LEGS      AGE 37-11   SEVERAL SURGERIES FOR POLIO   . SVT ABLATION N/A 11/26/2017   Procedure: SVT ABLATION;  Surgeon: Constance Haw, MD;  Location: [facility];  Service: Cardiovascular;  Laterality: N/A;  . THYROIDECTOMY     Social History   Socioeconomic History  . Marital status: Divorced    Spouse name: Not on file  . Number of children: Not on file  . Years of education: Not on file  . Highest education level: Not on file  Occupational History  . Not on file  Tobacco Use  . Smoking status: Former Smoker    Packs/day: 1.00    Years: 25.00    Pack years: 25.00    Start date: 77    Quit date: 1998    Years since quitting: 23.2  . Smokeless tobacco: Never Used  . Tobacco comment: QUIT SMOKING IN 1998  Substance and Sexual Activity  . Alcohol use: Yes    Comment: GLASS WINE W/ DINNER  . Drug use: No   . Sexual activity: Not on file  Other Topics Concern  . Not on file  Social History Narrative  . Not on file   Social Determinants of Health   Financial Resource Strain:   . Difficulty of Paying Living Expenses:   Food Insecurity:   . Worried About Charity fundraiser in the Last Year:   . Arboriculturist in the Last Year:   Transportation Needs:   . Film/video editor (Medical):   Marland Kitchen Lack of Transportation (Non-Medical):   Physical Activity:   . Days of Exercise per Week:   . Minutes of Exercise per Session:   Stress:   . Feeling of Stress :   Social Connections:   . Frequency of Communication with Friends and Family:   . Frequency of Social Gatherings with Friends and Family:   . Attends Religious Services:   . Active Member of Clubs or Organizations:   . Attends Archivist Meetings:   Marland Kitchen Marital Status:    Family History  Problem Relation Age of Onset  . Hypertension Mother   . Cancer Father        JAW  BONE  . COPD Brother   . Atrial fibrillation Brother    Allergies  Allergen Reactions  . Sulfa Antibiotics Rash and Other (See Comments)    Childhood allergy  . Ciprocinonide [Fluocinolone] Rash  . Ciprofloxacin Rash   Prior to Admission medications   Medication Sig Start Date End Date Taking? Authorizing Provider  acyclovir (ZOVIRAX) 400 MG tablet Take 400 mg at bedtime by mouth.  02/05/14  Yes [provider]  alendronate (FOSAMAX) 70 MG tablet Take 70 mg by mouth every Sunday. Take with a full glass of water on an empty stomach.    Yes [provider]  apixaban (ELIQUIS) 5 MG TABS tablet Take 1 tablet (5 mg total) by mouth 2 (two) times daily. 06/03/19  Yes Jerline Pain, MD  Ascorbic Acid (VITAMIN C) 1000 MG tablet Take 1,000 mg by mouth daily.   Yes [provider]  azelastine (OPTIVAR) 0.05 % ophthalmic solution Place 2 drops into both eyes daily as needed. Inching eyes 04/02/18  Yes [provider]  Calcium  Citrate-Vitamin D (CALCIUM CITRATE + D PO) Take 2 tablets by mouth at bedtime.    Yes [provider]  CARTIA XT 240 MG 24 hr capsule TAKE 1 CAPSULE BY MOUTH ONCE A DAY **PLEASE KEEP APPOINTMENT** 11/22/19  Yes Camnitz, Will Hassell Done, MD  chlorthalidone (HYGROTON) 25 MG tablet TAKE ONE TABLET BY MOUTH ONE TIME DAILY  11/05/19  Yes Camnitz, Will Hassell Done, MD  Coenzyme Q10 (CO Q-10) 100 MG CAPS Take 100 mg by mouth daily.    Yes [provider]  D-Ribose (RIBOSE, D,) POWD Take 2 g by mouth daily.    Yes [provider]  donepezil (ARICEPT) 10 MG tablet TAKE ONE TABLET BY MOUTH ONE TIME DAILY  11/15/19  Yes Cottle, Billey Co., MD  fexofenadine (ALLEGRA) 180 MG tablet Take 180 mg by mouth daily.   Yes [provider]  fluticasone (FLONASE) 50 MCG/ACT nasal spray Place 2 sprays into both nostrils daily.   Yes [provider]  Ginkgo Biloba 120 MG TABS Take 120 mg by mouth daily.    Yes [provider]  iloperidone (FANAPT) 4 MG TABS tablet Take 1 tablet (4 mg total) by mouth at bedtime. Patient taking differently: Take 1 mg by mouth at bedtime.  03/31/19  Yes Cottle, Billey Co., MD  levothyroxine (SYNTHROID, LEVOTHROID) 100 MCG tablet Take 100 mcg by mouth daily before breakfast.  02/16/14  Yes [provider]  Magnesium 200 MG TABS Take 200 mg by mouth every evening.   Yes [provider]  Melatonin 3 MG TABS Take 3 mg by mouth at bedtime.   Yes [provider]  Multiple Vitamin (MULTIVITAMIN) capsule Take 1 capsule daily by mouth.    Yes [provider]  Omega-3 Fatty Acids (FISH OIL BURP-LESS) 1000 MG CAPS Take 3,000 mg by mouth 2 (two) times daily.    Yes [provider]  oxybutynin (DITROPAN-XL) 10 MG 24 hr tablet Take 15 mg by mouth daily.    Yes [provider]  rosuvastatin (CRESTOR) 10 MG tablet TAKE 1 TABLET BY MOUTH UP TO 4 TIMES A WEEK ON SUNDAY, MONDAY, [facility] AND FRIDAY 12/14/19  Yes  Jerline Pain, MD  sodium chloride (OCEAN) 0.65 % SOLN nasal spray Place 2 sprays into both nostrils every evening.   Yes [provider]  temazepam (RESTORIL) 30 MG capsule TAKE ONE CAPSULE BY MOUTH DAILY AT BEDTIME  01/10/20  Yes Cottle, Billey Co., MD  traMADol (ULTRAM) 50 MG tablet Take 1 tablet (50 mg total) by mouth 3 (three) times daily as needed. 03/24/19  Yes Sater, Nanine Means, MD  ziprasidone (GEODON) 40 MG capsule TAKE ONE CAPSULE BY MOUTH ONE TIME DAILY  12/13/19  Yes Cottle, Billey Co., MD     Positive ROS: Otherwise negative  All other systems have been reviewed and were otherwise negative with the exception of those mentioned in the HPI and as above.  Physical Exam: Constitutional: Alert, well-appearing, no acute distress Ears: External ears without lesions or tenderness. Ear canals minimal wax in the right ear canal that was cleaned with a curette.  Right ear canal and right TM are clear.  Left ear canal reveals a drainage and swollen erythematous ear canal consistent with acute left external otitis.  Ear canal was cleaned with suction and applied CSF powder and Floxin eardrops.  Was mobile but slightly inflamed.. Nasal: External nose without lesions. Clear nasal passages Oral: Oropharynx clear. Neck: No palpable adenopathy or masses Respiratory: Breathing comfortably  Skin: No facial/neck lesions or rash noted.  Cerumen impaction removal  Date/Time: 02/23/2020 10:07 AM Performed by: Rozetta Nunnery, MD Authorized by: Rozetta Nunnery, MD   Consent:    Consent obtained:  Verbal   Consent given by:  Patient   Risks discussed:  Pain and bleeding Procedure details:    Location:  L ear and R ear   Procedure type: curette and suction   Post-procedure details:    Inspection:  TM intact and canal normal   Hearing quality:  Improved   Patient tolerance of procedure:  Tolerated well, no immediate complications Comments:     Patient with acute left otitis  externa.    Assessment: Acute left otitis externa Minimal wax buildup.  Plan: Prescribed Cortisporin otic suspension drops 4 to 5 drops in the left ear 3 times daily for 5 days.  She will notify us if symptoms have not improved.  Radene Journey, MD

## 2020-03-09 ENCOUNTER — Other Ambulatory Visit: Payer: Self-pay | Admitting: Psychiatry

## 2020-03-23 ENCOUNTER — Other Ambulatory Visit: Payer: Self-pay | Admitting: Psychiatry

## 2020-03-28 ENCOUNTER — Ambulatory Visit (INDEPENDENT_AMBULATORY_CARE_PROVIDER_SITE_OTHER): Payer: PPO | Admitting: Psychiatry

## 2020-03-28 ENCOUNTER — Encounter: Payer: Self-pay | Admitting: Psychiatry

## 2020-03-28 ENCOUNTER — Other Ambulatory Visit: Payer: Self-pay

## 2020-03-28 DIAGNOSIS — F250 Schizoaffective disorder, bipolar type: Secondary | ICD-10-CM

## 2020-03-28 DIAGNOSIS — F5105 Insomnia due to other mental disorder: Secondary | ICD-10-CM | POA: Diagnosis not present

## 2020-03-28 DIAGNOSIS — G3184 Mild cognitive impairment, so stated: Secondary | ICD-10-CM | POA: Diagnosis not present

## 2020-03-28 DIAGNOSIS — F99 Mental disorder, not otherwise specified: Secondary | ICD-10-CM | POA: Diagnosis not present

## 2020-03-28 MED ORDER — DONEPEZIL HCL 10 MG PO TABS: 10.0000 mg | ORAL_TABLET | Freq: Every day | ORAL | 3 refills | Status: DC

## 2020-03-28 MED ORDER — TEMAZEPAM 30 MG PO CAPS: 30.0000 mg | ORAL_CAPSULE | Freq: Once | ORAL | 1 refills | Status: DC

## 2020-03-28 MED ORDER — ZIPRASIDONE HCL 40 MG PO CAPS: 40.0000 mg | ORAL_CAPSULE | Freq: Every day | ORAL | 3 refills | Status: DC

## 2020-03-28 NOTE — Progress Notes (Signed)
Kaitlyn Hoover [id number] [birth year] 79 y.o.     Subjective:   Patient ID:  Kaitlyn Hoover is a 79 y.o. (DOB 07/08/1941) female.  Chief Complaint:  Chief Complaint  Patient presents with  . Follow-up    recent depression  . Medication Problem    Anxiety Patient reports no confusion, decreased concentration, nervous/anxious behavior or suicidal ideas.    Depression        Associated symptoms include no decreased concentration, no fatigue and no suicidal ideas.  Past medical history includes anxiety.    Kaitlyn Hoover presents for follow-up of psych issures  Last seen November 2020  and no meds were changed.  She called in August 2020  wanting to increase donepezil from 5 to 10 mg daily.  That was done.  Deep depression March.  No pcpt other than being confined.  No seasonal changes.  No real problems with depression in years.  Did worry about it.  Even had SI without plan.  Then got better.  Totally better now spontaneously.  She wonders about reducing ziprasidone. Doing OK overall.  Bored.  Not depressed.  Sleeping well with temazepam 30 mg with 8-9 hours.  Doing Zoom with church and looks forward to it.  Otherwise watching TV. Tolerated Aricept and has seen benefit for recall.  No paranoia or fear.    Patient reports stable mood and denies depressed or irritable moods.  Patient denies any recent difficulty with anxiety.  Patient reports that energy and motivation have been good.  Patient denies any difficulty with concentration.  Donepezil has really helped with her memory she is well pleased.  Patient denies any suicidal ideation.  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  Past Psychiatric Medication Trials: Olanzapine haloperidol, Abilify, Depakote 250 mg 3 times daily, Latuda, Saphris, Geodon 60 , benztropine, Artane, clonazepam side effects, Tranxene increased appetite, temazepam, Fanapt 2 mg, trazodone, hydroxyzine She has been under the care of this office  since 2003.   Review of Systems:  Review of Systems  Constitutional: Negative for fatigue.  HENT:       Jaw clenching stable.  Musculoskeletal: Positive for back pain.  Neurological: Positive for facial asymmetry and weakness. Negative for tremors.  Psychiatric/Behavioral: Positive for depression. Negative for agitation, behavioral problems, confusion, decreased concentration, dysphoric mood, hallucinations, self-injury, sleep disturbance and suicidal ideas. The patient is not nervous/anxious and is not hyperactive.     Medications: I have reviewed the patient's current medications.  Current Outpatient Medications  Medication Sig Dispense Refill  . acyclovir (ZOVIRAX) 400 MG tablet Take 400 mg at bedtime by mouth.     Marland Kitchen alendronate (FOSAMAX) 70 MG tablet Take 70 mg by mouth every Sunday. Take with a full glass of water on an empty stomach.     Marland Kitchen apixaban (ELIQUIS) 5 MG TABS tablet Take 1 tablet (5 mg total) by mouth 2 (two) times daily. 180 tablet 3  . Ascorbic Acid (VITAMIN C) 1000 MG tablet Take 1,000 mg by mouth daily.    Marland Kitchen azelastine (OPTIVAR) 0.05 % ophthalmic solution Place 2 drops into both eyes daily as needed. Inching eyes    . Calcium Citrate-Vitamin D (CALCIUM CITRATE + D PO) Take 2 tablets by mouth at bedtime.     Marland Kitchen CARTIA XT 240 MG 24 hr capsule TAKE 1 CAPSULE BY MOUTH ONCE A DAY **PLEASE KEEP APPOINTMENT** 90 capsule 3  . chlorthalidone (HYGROTON) 25 MG tablet TAKE ONE TABLET BY MOUTH ONE TIME DAILY  90 tablet 3  . Coenzyme Q10 (CO Q-10) 100 MG CAPS Take 100 mg by mouth daily.     Marland Kitchen D-Ribose (RIBOSE, D,) POWD Take 2 g by mouth daily.     Marland Kitchen donepezil (ARICEPT) 10 MG tablet Take 1 tablet (10 mg total) by mouth daily. 90 tablet 3  . FANAPT 4 MG TABS tablet TAKE 1 TABLET BY MOUTH AT BEDTIME (Patient taking differently: Take 1 mg by mouth daily in the afternoon. ) 30 tablet 4  . fexofenadine (ALLEGRA) 180 MG tablet Take 180 mg by mouth daily.    . fluticasone (FLONASE) 50  MCG/ACT nasal spray Place 2 sprays into both nostrils daily.    . Ginkgo Biloba 120 MG TABS Take 120 mg by mouth daily.     Marland Kitchen levothyroxine (SYNTHROID, LEVOTHROID) 100 MCG tablet Take 100 mcg by mouth daily before breakfast.     . Magnesium 200 MG TABS Take 200 mg by mouth every evening.    . Melatonin 3 MG TABS Take 3 mg by mouth at bedtime.    . Multiple Vitamin (MULTIVITAMIN) capsule Take 1 capsule daily by mouth.     . Omega-3 Fatty Acids (FISH OIL BURP-LESS) 1000 MG CAPS Take 3,000 mg by mouth 2 (two) times daily.     Marland Kitchen oxybutynin (DITROPAN-XL) 10 MG 24 hr tablet Take 15 mg by mouth daily.     . rosuvastatin (CRESTOR) 10 MG tablet TAKE 1 TABLET BY MOUTH UP TO 4 TIMES A WEEK ON SUNDAY, MONDAY, WEDNESDAY AND FRIDAY 90 tablet 3  . sodium chloride (OCEAN) 0.65 % SOLN nasal spray Place 2 sprays into both nostrils every evening.    . temazepam (RESTORIL) 30 MG capsule Take 1 capsule (30 mg total) by mouth once for 1 dose. 90 capsule 1  . traMADol (ULTRAM) 50 MG tablet Take 1 tablet (50 mg total) by mouth 3 (three) times daily as needed. 90 tablet 3  . ziprasidone (GEODON) 40 MG capsule Take 1 capsule (40 mg total) by mouth daily. 90 capsule 3   No current facility-administered medications for this visit.    Medication Side Effects: Other: EPS prone.  Allergies:  Allergies  Allergen Reactions  . Sulfa Antibiotics Rash and Other (See Comments)    Childhood allergy  . Ciprocinonide [Fluocinolone] Rash  . Ciprofloxacin Rash    Past Medical History:  Diagnosis Date  . Anxiety   . Arthritis   . Atrial fibrillation with RVR ([place])   . Bipolar disorder ([place])   . Dysrhythmia   . GERD (gastroesophageal reflux disease)   . Hypertension   . Hypothyroidism   . Palpitations   . PAT (paroxysmal atrial tachycardia) ([place])   . Post-polio syndrome   . Sinus infection   . 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 Hypertension Mother   . Cancer  Father        JAW BONE  . COPD Brother   . Atrial fibrillation Brother     Social History   Socioeconomic History  . Marital status: Divorced    Spouse name: Not on file  . Number of children: Not on file  . Years of education: Not on file  . Highest education level: Not on file  Occupational History  . Not on file  Tobacco Use  . Smoking status: Former Smoker    Packs/day: 1.00    Years: 25.00    Pack  years: 25.00    Start date: 55    Quit date: 1998    Years since quitting: 23.3  . Smokeless tobacco: Never Used  . Tobacco comment: QUIT SMOKING IN 1998  Substance and Sexual Activity  . Alcohol use: Yes    Comment: GLASS WINE W/ DINNER  . Drug use: No  . Sexual activity: Not on file  Other Topics Concern  . Not on file  Social History Narrative  . Not on file   Social Determinants of Health   Financial Resource Strain:   . Difficulty of Paying Living Expenses:   Food Insecurity:   . Worried About Charity fundraiser in the Last Year:   . Arboriculturist in the Last Year:   Transportation Needs:   . Film/video editor (Medical):   Marland Kitchen Lack of Transportation (Non-Medical):   Physical Activity:   . Days of Exercise per Week:   . Minutes of Exercise per Session:   Stress:   . Feeling of Stress :   Social Connections:   . Frequency of Communication with Friends and Family:   . Frequency of Social Gatherings with Friends and Family:   . Attends Religious Services:   . Active Member of Clubs or Organizations:   . Attends Archivist Meetings:   Marland Kitchen Marital Status:   Intimate Partner Violence:   . Fear of Current or Ex-Partner:   . Emotionally Abused:   Marland Kitchen Physically Abused:   . Sexually Abused:     Past Medical History, Surgical history, Social history, and Family history were reviewed and updated as appropriate.   Please see review of systems for further details on the patient's review from today.   Objective:   Physical Exam:  LMP  (LMP  Unknown)   Physical Exam Constitutional:      General: She is not in acute distress. Musculoskeletal:        General: No deformity.  Neurological:     Mental Status: She is alert and oriented to person, place, and time.     Cranial Nerves: Dysarthria present.     Coordination: Coordination normal.     Comments: Mild jaw clenching  Psychiatric:        Attention and Perception: Attention and perception normal. She does not perceive auditory or visual hallucinations.        Mood and Affect: Mood is not anxious or depressed. Affect is not labile, blunt, angry or inappropriate.        Speech: Speech normal.        Behavior: Behavior normal. Behavior is not agitated or aggressive. Behavior is cooperative.        Thought Content: Thought content normal. Thought content is not paranoid or delusional. Thought content does not include homicidal or suicidal ideation. Thought content does not include homicidal or suicidal plan.        Cognition and Memory: Cognition and memory normal.        Judgment: Judgment normal.     Comments: History of paranoia in remission Some worry over post polio complications   history of paranoia controlled.   Lab Review:     Component Value Date/Time   NA 140 09/21/2019 1215   K 3.7 09/21/2019 1215   CL 99 09/21/2019 1215   CO2 25 09/21/2019 1215   GLUCOSE 102 (H) 09/21/2019 1215   GLUCOSE 108 (H) 04/01/2018 1748   BUN 21 09/21/2019 1215   CREATININE 1.10 (H) 09/21/2019 1215  CALCIUM 9.5 09/21/2019 1215   PROT 7.9 04/02/2017 0946   PROT 7.2 02/12/2017 1013   ALBUMIN 3.2 (L) 04/02/2017 0946   ALBUMIN 4.0 02/12/2017 1013   AST 31 04/02/2017 0946   ALT 15 04/02/2017 0946   ALKPHOS 81 04/02/2017 0946   BILITOT 0.5 04/02/2017 0946   BILITOT 0.4 02/12/2017 1013   GFRNONAA 49 (L) 09/21/2019 1215   GFRAA 56 (L) 09/21/2019 1215       Component Value Date/Time   WBC 7.9 09/21/2019 1215   WBC 5.7 04/01/2018 1748   RBC 4.13 09/21/2019 1215   RBC 3.94  04/01/2018 1748   HGB 12.9 09/21/2019 1215   HCT 39.4 09/21/2019 1215   PLT 228 09/21/2019 1215   MCV 95 09/21/2019 1215   MCH 31.2 09/21/2019 1215   MCH 31.5 04/01/2018 1748   MCHC 32.7 09/21/2019 1215   MCHC 33.8 04/01/2018 1748   RDW 12.7 09/21/2019 1215   LYMPHSABS 1.8 04/11/2017 0911   MONOABS 0.5 04/02/2017 0946   EOSABS 0.2 04/11/2017 0911   BASOSABS 0.1 04/11/2017 0911    No results found for: POCLITH, LITHIUM   No results found for: PHENYTOIN, PHENOBARB, VALPROATE, CBMZ   .res Assessment: Plan:    Schizoaffective disorder, bipolar type ([place]) - Plan: ziprasidone (GEODON) 40 MG capsule  Insomnia due to other mental disorder - Plan: temazepam (RESTORIL) 30 MG capsule  Mild cognitive impairment - Plan: donepezil (ARICEPT) 10 MG tablet   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 It does not appear severe enough to warrant additional medication.  Greater than 50% of face to face time with patient was spent on counseling and coordination of care.  K has a long history of repeated episodes of paranoia associated with hypomania at times and at times independent of mood changes.  WE discussed her med-sensitivity.  As noted above she has tried multiple different antipsychotics and different antipsychotic combinations.  Discussed potential metabolic side effects associated with atypical antipsychotics, as well as potential risk for movement side effects. Advised pt to contact office if movement side effects occur.  She has mild dysarthria which may be related but it is no worse and it stable.  She is very specifically aware of the risk of tardive dyskinesia.   The unusual combination of antipsychotics is helpful in that the [place] actually offsets some of the dystonia that the Geodon tends to cause.  Lower dose of antipsychotics has led to worsening paranoia.  Her paranoia is well controlled at the current combination of medications.  Aricept 10  helped memory.  No SE. Still notices benefit.  ? Whether Feb vaccine triggered March depression bc depression is unusual for her.  Not a strong physical rxn to vaccine.    Discussed safety plan at length with patient.  Advised patient to contact office with any worsening signs and symptoms.  Instructed patient to go to the [facility] emergency room for evaluation if experiencing any acute safety concerns, to include suicidal intent.  We discussed the short-term risks associated with benzodiazepines including sedation and increased fall risk among others.  Discussed long-term side effect risk including dependence, potential withdrawal symptoms, and the potential eventual dose-related risk of dementia.  But recent studies from 2020 dispute this association between benzodiazepines and dementia risk. Newer studies in 2020 do not support an association with dementia.  We have tried to taper her off benzodiazepines for sleep and she was able to be off for a short  period of time but then had recurrence of insomnia.  She is aware of the risks but the benefit for sleep is noteworthy and she is failed multiple other sleep meds.   No med changes indicated She has had worsening paranoia at lower doses of ziprasidone in the past.  Follow-up 6 months  Lynder Parents MD, DFAPA   Please see After Visit Summary for patient specific instructions.  No future appointments.  No orders of the defined types were placed in this encounter.     -------------------------------

## 2020-05-04 ENCOUNTER — Other Ambulatory Visit: Payer: Self-pay | Admitting: Cardiology

## 2020-05-04 NOTE — Telephone Encounter (Signed)
Eliquis 5mg  refill request received. Patient is 79 years old, weight-63kg, Crea-1.10 on 09/21/2019, Diagnosis-Afib, and last seen by Dr. Curt Bears on 09/21/2019. Dose is appropriate based on dosing criteria. Will send in refill to requested pharmacy.

## 2020-06-16 DIAGNOSIS — E538 Deficiency of other specified B group vitamins: Secondary | ICD-10-CM | POA: Diagnosis not present

## 2020-06-16 DIAGNOSIS — R5382 Chronic fatigue, unspecified: Secondary | ICD-10-CM | POA: Diagnosis not present

## 2020-06-16 DIAGNOSIS — R399 Unspecified symptoms and signs involving the genitourinary system: Secondary | ICD-10-CM | POA: Diagnosis not present

## 2020-06-16 DIAGNOSIS — E611 Iron deficiency: Secondary | ICD-10-CM | POA: Diagnosis not present

## 2020-06-26 ENCOUNTER — Telehealth: Payer: Self-pay | Admitting: Psychiatry

## 2020-06-26 ENCOUNTER — Other Ambulatory Visit: Payer: Self-pay

## 2020-06-26 DIAGNOSIS — F99 Mental disorder, not otherwise specified: Secondary | ICD-10-CM

## 2020-06-26 MED ORDER — TEMAZEPAM 15 MG PO CAPS: 15.0000 mg | ORAL_CAPSULE | Freq: Every day | ORAL | 0 refills | Status: DC

## 2020-06-26 NOTE — Telephone Encounter (Signed)
Pt has been on ziprasidone for several years and decided to stop taking it last Thursday. Pt has not slept at all since then. Pt decided to stop it because she was sleeping until 10:30 everyday. Pt wants to know if she should start back taking. Please call.

## 2020-06-26 NOTE — Telephone Encounter (Signed)
Thank you for the clarification.  She has tapered off temazepam before but had to restart it.  She is tried alternatives clonazepam, Tranxene, trazodone, hydroxyzine.  She prefers to avoid meds that could have weight gain.  She could probably stop temazepam more quickly and easily and still sleep if she took half of the 25 mg quetiapine but it may still give her a hangover.  The way we tapered her off temazepam the last time was in the traditional manner of reducing the dose.  She still will have some insomnia but it likely is to be more bearable.  I would suggest we send in a prescription for 15 mg capsules and she take that for 2 to 4 weeks and she can try stopping again if she wants to at that point.  If she stops temazepam cold [place] as she is doing currently she is likely to have pretty serious insomnia and very fragmented sleep that will not respond to Benadryl for a period of 2 to 4 weeks.  She has chronic insomnia which is the reason she is taken the temazepam and I am not sure what her sleep will be like once she gets through the withdrawal phase of stopping the temazepam.

## 2020-06-26 NOTE — Telephone Encounter (Signed)
Rtc to patient and the message was not received correctly. She would never stop her Geodon without being instructed to by you, she does know better.   She's referring to the Temazepam 30 mg, she stopped on Thursday and hasn't slept in  4 days. She asked if eventually she would start sleeping without? I discussed that you would might recommend she go down to 15 mg, she really doesn't want to take it at all but will do as instructed. She does really she has to get some sleep. She's tried Benadryl but not helped either.

## 2020-06-26 NOTE — Telephone Encounter (Signed)
Rtc to [facility] and she would prefer the 15 mg capsule of Temazepam, take 1 at hs. Rx called into her [facility] [address] Discussed her chronic insomnia and she verbalized understanding. Advised to call back with worsening condition or not effective for sleep.

## 2020-06-26 NOTE — Telephone Encounter (Signed)
She is very med sensitive!  And needs to come off the ziprasidone more gradually.  She is also going to have probably some other withdrawal symptoms if she just stops from the 40 mg she was previously taking.  I assume she has not tapered any though she might have as she is a retired Scientist, product/process development.  We need to get her the 20 mg ziprasidone and have her reduce the dose to that and then get an appointment with me to follow-up in the next steps.

## 2020-07-05 ENCOUNTER — Encounter: Payer: Self-pay | Admitting: Psychiatry

## 2020-07-05 ENCOUNTER — Other Ambulatory Visit: Payer: Self-pay

## 2020-07-05 ENCOUNTER — Ambulatory Visit (INDEPENDENT_AMBULATORY_CARE_PROVIDER_SITE_OTHER): Payer: PPO | Admitting: Psychiatry

## 2020-07-05 DIAGNOSIS — F5105 Insomnia due to other mental disorder: Secondary | ICD-10-CM

## 2020-07-05 DIAGNOSIS — F99 Mental disorder, not otherwise specified: Secondary | ICD-10-CM | POA: Diagnosis not present

## 2020-07-05 DIAGNOSIS — F250 Schizoaffective disorder, bipolar type: Secondary | ICD-10-CM | POA: Diagnosis not present

## 2020-07-05 DIAGNOSIS — G3184 Mild cognitive impairment, so stated: Secondary | ICD-10-CM

## 2020-07-05 MED ORDER — LAMOTRIGINE 25 MG PO TABS: ORAL_TABLET | ORAL | 1 refills | Status: DC

## 2020-07-05 MED ORDER — TEMAZEPAM 15 MG PO CAPS: 15.0000 mg | ORAL_CAPSULE | Freq: Every day | ORAL | 3 refills | Status: DC

## 2020-07-05 NOTE — Progress Notes (Signed)
Kaitlyn Hoover [id number] [birth year] 79 y.o.     Subjective:   Patient ID:  Kaitlyn Hoover is a 79 y.o. (DOB 26-Jan-1941) female.  Chief Complaint:  Chief Complaint  Patient presents with  . Follow-up  . Depression  . Sleeping Problem    Anxiety Patient reports no confusion, decreased concentration, nervous/anxious behavior or suicidal ideas.    Depression        Associated symptoms include fatigue.  Associated symptoms include no decreased concentration and no suicidal ideas.  Past medical history includes anxiety.    Kaitlyn Hoover presents for follow-up of psych issures  seen November 2020  and no meds were changed.  She called in August 2020  wanting to increase donepezil from 5 to 10 mg daily.  That was done.  03/28/20 appt with the following noted: Deep depression March.  No pcpt other than being confined.  No seasonal changes.  No real problems with depression in years.  Did worry about it.  Even had SI without plan.  Then got better.  Totally better now spontaneously.  She wonders about reducing ziprasidone. Doing OK overall.  Bored.  Not depressed.  Sleeping well with temazepam 30 mg with 8-9 hours.  Doing Zoom with church and looks forward to it.  Otherwise watching TV. Tolerated Aricept and has seen benefit for recall.  No paranoia or fear.   No med changes  06/26/20 TC stating: She's referring to the Temazepam 30 mg, she stopped on Thursday and hasn't slept in  4 days. She asked if eventually she would start sleeping without? I discussed that you would might recommend she go down to 15 mg, she really doesn't want to take it at all but will do as instructed. She does really she has to get some sleep. She's tried Benadryl but not helped either.  Following info was given to her: She has tapered off temazepam before but had to restart it.  She is tried alternatives clonazepam, Tranxene, trazodone, hydroxyzine.  She prefers to avoid meds that could have weight gain.   She could probably stop temazepam more quickly and easily and still sleep if she took half of the 25 mg quetiapine but it may still give her a hangover. The way we tapered her off temazepam the last time was in the traditional manner of reducing the dose.  She still will have some insomnia but it likely is to be more bearable.  I would suggest we send in a prescription for 15 mg capsules and she take that for 2 to 4 weeks and she can try stopping again if she wants to at that point. If she stops temazepam cold [place] as she is doing currently she is likely to have pretty serious insomnia and very fragmented sleep that will not respond to Benadryl for a period of 2 to 4 weeks.  She has chronic insomnia which is the reason she is taken the temazepam and I am not sure what her sleep will be like once she gets through the withdrawal phase of stopping the temazepam. Pt via TC stated: Rtc to [facility] and she would prefer the 15 mg capsule of Temazepam, take 1 at hs. Rx called [facility] her [facility] [address] Discussed her chronic insomnia and she verbalized understanding. Advised to call back with worsening condition or not effective for sleep.   07/05/20 appt with the following noted: Hell trying to stop temazepam 30 mg cold [place].  Couldn't sleep for days. Sleep is  fine on 15 mg HS however. No interest, not going to church since Pandemic.  Not worrying much about Covid.  Got vaccine.  Can't motivate herself to go get clothes or yard work.  Just sits and watches TV or reads.  Only social with friends once monthly.  Don't understand while not going to church.  Unmotivated and some sadness. No sig worry or fear other than about her mental health and no paranoia. Doctor said iron was low.  Recent UTI and ABx helped energy.  Donepezil has really helped with her memory she is well pleased.  Patient denies any suicidal ideation.  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  Past Psychiatric Medication Trials:  Olanzapine haloperidol, Abilify, Latuda, Saphris, Geodon 60 , Fanapt 2 mg,  Depakote 250 mg 3 times d aily, benztropine, Artane,  clonazepam side effects, Tranxene increased appetite, temazepam,  trazodone, hydroxyzine She has been under the care of this office since 2003.  Review of Systems:  Review of Systems  Constitutional: Positive for fatigue.  HENT:       Jaw clenching stable.  Musculoskeletal: Positive for back pain and gait problem.  Neurological: Positive for facial asymmetry and weakness. Negative for tremors.  Psychiatric/Behavioral: Positive for depression. Negative for agitation, behavioral problems, confusion, decreased concentration, dysphoric mood, hallucinations, self-injury, sleep disturbance and suicidal ideas. The patient is not nervous/anxious and is not hyperactive.     Medications: I have reviewed the patient's current medications.  Current Outpatient Medications  Medication Sig Dispense Refill  . acyclovir (ZOVIRAX) 400 MG tablet Take 400 mg at bedtime by mouth.     Marland Kitchen alendronate (FOSAMAX) 70 MG tablet Take 70 mg by mouth every Sunday. Take with a full glass of water on an empty stomach.     . Ascorbic Acid (VITAMIN C) 1000 MG tablet Take 1,000 mg by mouth daily.    Marland Kitchen azelastine (OPTIVAR) 0.05 % ophthalmic solution Place 2 drops into both eyes daily as needed. Inching eyes    . Calcium Citrate-Vitamin D (CALCIUM CITRATE + D PO) Take 2 tablets by mouth at bedtime.     Marland Kitchen CARTIA XT 240 MG 24 hr capsule TAKE 1 CAPSULE BY MOUTH ONCE A DAY **PLEASE KEEP APPOINTMENT** 90 capsule 3  . chlorthalidone (HYGROTON) 25 MG tablet TAKE ONE TABLET BY MOUTH ONE TIME DAILY  90 tablet 3  . Coenzyme Q10 (CO Q-10) 100 MG CAPS Take 100 mg by mouth daily.     Marland Kitchen D-Ribose (RIBOSE, D,) POWD Take 2 g by mouth daily.     Marland Kitchen donepezil (ARICEPT) 10 MG tablet Take 1 tablet (10 mg total) by mouth daily. 90 tablet 3  . ELIQUIS 5 MG TABS tablet TAKE 1 TABLET BY MOUTH TWICE A DAY 180 tablet 1  .  FANAPT 4 MG TABS tablet TAKE 1 TABLET BY MOUTH AT BEDTIME (Patient taking differently: 1 mg daily) 30 tablet 4  . fexofenadine (ALLEGRA) 180 MG tablet Take 180 mg by mouth daily.    . fluticasone (FLONASE) 50 MCG/ACT nasal spray Place 2 sprays into both nostrils daily.    . Ginkgo Biloba 120 MG TABS Take 120 mg by mouth daily.     Marland Kitchen levothyroxine (SYNTHROID, LEVOTHROID) 100 MCG tablet Take 100 mcg by mouth daily before breakfast.     . Magnesium 200 MG TABS Take 200 mg by mouth every evening.    . Melatonin 3 MG TABS Take 3 mg by mouth at bedtime.    . Multiple Vitamin (  MULTIVITAMIN) capsule Take 1 capsule daily by mouth.     . Omega-3 Fatty Acids (FISH OIL BURP-LESS) 1000 MG CAPS Take 3,000 mg by mouth 2 (two) times daily.     Marland Kitchen oxybutynin (DITROPAN-XL) 10 MG 24 hr tablet Take 15 mg by mouth daily.     . rosuvastatin (CRESTOR) 10 MG tablet TAKE 1 TABLET BY MOUTH UP TO 4 TIMES A WEEK ON SUNDAY, MONDAY, WEDNESDAY AND FRIDAY 90 tablet 3  . sodium chloride (OCEAN) 0.65 % SOLN nasal spray Place 2 sprays into both nostrils every evening.    . temazepam (RESTORIL) 15 MG capsule Take 1 capsule (15 mg total) by mouth at bedtime. 30 capsule 3  . traMADol (ULTRAM) 50 MG tablet Take 1 tablet (50 mg total) by mouth 3 (three) times daily as needed. 90 tablet 3  . ziprasidone (GEODON) 40 MG capsule Take 1 capsule (40 mg total) by mouth daily. 90 capsule 3  . lamoTRIgine (LAMICTAL) 25 MG tablet 1 tablet for 2 weeks, then 2 tablets daily for 2 weeks, then 3 tablets daily for 2 weeks then 4 tablets daily 120 tablet 1   No current facility-administered medications for this visit.    Medication Side Effects: Other: EPS prone.  Allergies:  Allergies  Allergen Reactions  . Sulfa Antibiotics Rash and Other (See Comments)    Childhood allergy  . Ciprocinonide [Fluocinolone] Rash  . Ciprofloxacin Rash    Past Medical History:  Diagnosis Date  . Anxiety   . Arthritis   . Atrial fibrillation with RVR ([place])    . Bipolar disorder ([place])   . Dysrhythmia   . GERD (gastroesophageal reflux disease)   . Hypertension   . Hypothyroidism   . Palpitations   . PAT (paroxysmal atrial tachycardia) ([place])   . Post-polio syndrome   . Sinus infection   . 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 Hypertension Mother   . Cancer Father        JAW BONE  . COPD Brother   . Atrial fibrillation Brother     Social History   Socioeconomic History  . Marital status: Divorced    Spouse name: Not on file  . Number of children: Not on file  . Years of education: Not on file  . Highest education level: Not on file  Occupational History  . Not on file  Tobacco Use  . Smoking status: Former Smoker    Packs/day: 1.00    Years: 25.00    Pack years: 25.00    Start date: 2    Quit date: 1998    Years since quitting: 23.6  . Smokeless tobacco: Never Used  . Tobacco comment: QUIT SMOKING IN 1998  Vaping Use  . Vaping Use: Never used  Substance and Sexual Activity  . Alcohol use: Yes    Comment: GLASS WINE W/ DINNER  . Drug use: No  . Sexual activity: Not on file  Other Topics Concern  . Not on file  Social History Narrative  . Not on file   Social Determinants of Health   Financial Resource Strain:   . Difficulty of Paying Living Expenses:   Food Insecurity:   . Worried About Charity fundraiser in the Last Year:   . Arboriculturist in the Last Year:   Transportation Needs:   . Film/video editor (Medical):   Marland Kitchen Lack of Transportation (  Non-Medical):   Physical Activity:   . Days of Exercise per Week:   . Minutes of Exercise per Session:   Stress:   . Feeling of Stress :   Social Connections:   . Frequency of Communication with Friends and Family:   . Frequency of Social Gatherings with Friends and Family:   . Attends Religious Services:   . Active Member of Clubs or Organizations:   . Attends Archivist Meetings:   Marland Kitchen Marital  Status:   Intimate Partner Violence:   . Fear of Current or Ex-Partner:   . Emotionally Abused:   Marland Kitchen Physically Abused:   . Sexually Abused:     Past Medical History, Surgical history, Social history, and Family history were reviewed and updated as appropriate.   Please see review of systems for further details on the patient's review from today.   Objective:   Physical Exam:  LMP  (LMP Unknown)   Physical Exam Constitutional:      General: She is not in acute distress. Musculoskeletal:        General: No deformity.  Neurological:     Mental Status: She is alert and oriented to person, place, and time.     Cranial Nerves: Dysarthria present.     Coordination: Coordination normal.     Comments: Mild jaw clenching  Psychiatric:        Attention and Perception: Attention and perception normal. She does not perceive auditory or visual hallucinations.        Mood and Affect: Mood is depressed. Mood is not anxious. Affect is not labile, blunt, angry or inappropriate.        Speech: Speech normal.        Behavior: Behavior normal. Behavior is not agitated or aggressive. Behavior is cooperative.        Thought Content: Thought content normal. Thought content is not paranoid or delusional. Thought content does not include homicidal or suicidal ideation. Thought content does not include homicidal or suicidal plan.        Cognition and Memory: Cognition and memory normal.        Judgment: Judgment normal.     Comments: History of paranoia in remission Some worry over post polio complications   history of paranoia controlled.   Lab Review:     Component Value Date/Time   NA 140 09/21/2019 1215   K 3.7 09/21/2019 1215   CL 99 09/21/2019 1215   CO2 25 09/21/2019 1215   GLUCOSE 102 (H) 09/21/2019 1215   GLUCOSE 108 (H) 04/01/2018 1748   BUN 21 09/21/2019 1215   CREATININE 1.10 (H) 09/21/2019 1215   CALCIUM 9.5 09/21/2019 1215   PROT 7.9 04/02/2017 0946   PROT 7.2 02/12/2017 1013    ALBUMIN 3.2 (L) 04/02/2017 0946   ALBUMIN 4.0 02/12/2017 1013   AST 31 04/02/2017 0946   ALT 15 04/02/2017 0946   ALKPHOS 81 04/02/2017 0946   BILITOT 0.5 04/02/2017 0946   BILITOT 0.4 02/12/2017 1013   GFRNONAA 49 (L) 09/21/2019 1215   GFRAA 56 (L) 09/21/2019 1215       Component Value Date/Time   WBC 7.9 09/21/2019 1215   WBC 5.7 04/01/2018 1748   RBC 4.13 09/21/2019 1215   RBC 3.94 04/01/2018 1748   HGB 12.9 09/21/2019 1215   HCT 39.4 09/21/2019 1215   PLT 228 09/21/2019 1215   MCV 95 09/21/2019 1215   MCH 31.2 09/21/2019 1215   MCH 31.5 04/01/2018 1748  MCHC 32.7 09/21/2019 1215   MCHC 33.8 04/01/2018 1748   RDW 12.7 09/21/2019 1215   LYMPHSABS 1.8 04/11/2017 0911   MONOABS 0.5 04/02/2017 0946   EOSABS 0.2 04/11/2017 0911   BASOSABS 0.1 04/11/2017 0911    No results found for: POCLITH, LITHIUM   No results found for: PHENYTOIN, PHENOBARB, VALPROATE, CBMZ   .res Assessment: Plan:    Schizoaffective disorder, bipolar type ([place]) - Plan: lamoTRIgine (LAMICTAL) 25 MG tablet  Insomnia due to other mental disorder - Plan: temazepam (RESTORIL) 15 MG capsule  Mild cognitive impairment   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 It does not appear severe enough to warrant additional medication.  Greater than 50% of face to face time with patient was spent on counseling and coordination of care.  K has a long history of repeated episodes of paranoia associated with hypomania at times and at times independent of mood changes.  WE discussed her med-sensitivity.  As noted above she has tried multiple different antipsychotics and different antipsychotic combinations.  Discussed potential metabolic side effects associated with atypical antipsychotics, as well as potential risk for movement side effects. Advised pt to contact office if movement side effects occur.  She has mild dysarthria which may be related but it is no worse and it stable.   She is very specifically aware of the risk of tardive dyskinesia.   The unusual combination of antipsychotics is helpful in that the [place] actually offsets some of the dystonia that the Geodon tends to cause.  Lower dose of antipsychotics has led to worsening paranoia.  Her paranoia is well controlled at the current combination of medications.  Aricept 10 helped memory.  No SE. Still notices benefit.  ? Whether Feb vaccine triggered March depression bc depression is unusual for her.  Not a strong physical rxn to vaccine.    Discussed safety plan at length with patient.  Advised patient to contact office with any worsening signs and symptoms.  Instructed patient to go to the [facility] emergency room for evaluation if experiencing any acute safety concerns, to include suicidal intent.  We discussed the short-term risks associated with benzodiazepines including sedation and increased fall risk among others.  Discussed long-term side effect risk including dependence, potential withdrawal symptoms, and the potential eventual dose-related risk of dementia.  But recent studies from 2020 dispute this association between benzodiazepines and dementia risk. Newer studies in 2020 do not support an association with dementia.  We have tried to taper her off benzodiazepines for sleep and she was able to be off for a short period of time but then had recurrence of insomnia.  She is aware of the risks but the benefit for sleep is noteworthy and she is failed multiple other sleep meds.   So far ok with reduced temazepam 15 mg HS.  She has had worsening paranoia at lower doses of ziprasidone in the past.  Follow-up 6 months  Lynder Parents MD, DFAPA   Please see After Visit Summary for patient specific instructions.  Future Appointments  Date Time Provider [place]  09/28/2020  1:00 PM Cottle, Billey Co., MD CP-CP None    No orders of the defined types were placed in this encounter.      -------------------------------

## 2020-07-28 ENCOUNTER — Other Ambulatory Visit: Payer: Self-pay | Admitting: Psychiatry

## 2020-07-28 DIAGNOSIS — F250 Schizoaffective disorder, bipolar type: Secondary | ICD-10-CM

## 2020-08-01 NOTE — Telephone Encounter (Signed)
Review.

## 2020-08-03 DIAGNOSIS — Z23 Encounter for immunization: Secondary | ICD-10-CM | POA: Diagnosis not present

## 2020-08-08 ENCOUNTER — Ambulatory Visit: Payer: PPO

## 2020-08-24 ENCOUNTER — Other Ambulatory Visit: Payer: Self-pay

## 2020-08-24 ENCOUNTER — Ambulatory Visit (INDEPENDENT_AMBULATORY_CARE_PROVIDER_SITE_OTHER): Payer: PPO | Admitting: Otolaryngology

## 2020-08-24 VITALS — Temp 97.3°F

## 2020-08-24 DIAGNOSIS — H903 Sensorineural hearing loss, bilateral: Secondary | ICD-10-CM | POA: Diagnosis not present

## 2020-08-24 DIAGNOSIS — H6123 Impacted cerumen, bilateral: Secondary | ICD-10-CM | POA: Diagnosis not present

## 2020-08-24 NOTE — Progress Notes (Signed)
HPI: Kaitlyn Hoover is a 79 y.o. female who returns today for evaluation of left ear drainage.  It is doing better today and not painful as she is wearing her hearing aid.  Earlier she has some drainage and pain in the left ear.  She was treated for external otitis several months ago by myself with Cortisporin eardrops..  Past Medical History:  Diagnosis Date  . Anxiety   . Arthritis   . Atrial fibrillation with RVR ([place])   . Bipolar disorder ([place])   . Dysrhythmia   . GERD (gastroesophageal reflux disease)   . Hypertension   . Hypothyroidism   . Palpitations   . PAT (paroxysmal atrial tachycardia) ([place])   . Post-polio syndrome   . Sinus infection   . 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 ABDOMINAL HYSTERECTOMY    . ABLATION OF DYSRHYTHMIC FOCUS  04/25/2017  . ATRIAL FIBRILLATION ABLATION N/A 04/25/2017   Procedure: Atrial Fibrillation Ablation;  Surgeon: Constance Haw, MD;  Location: [place] [facility];  Service: Cardiovascular;  Laterality: N/A;  . BREAST EXCISIONAL BIOPSY Bilateral    benign  . CATARACT EXTRACTION W/ INTRAOCULAR LENS  IMPLANT, BILATERAL    . CERVICAL LAMINECTOMY    . CESAREAN SECTION     X2   . LEGS      AGE 33-11   SEVERAL SURGERIES FOR POLIO   . SVT ABLATION N/A 11/26/2017   Procedure: SVT ABLATION;  Surgeon: Constance Haw, MD;  Location: [place] [facility];  Service: Cardiovascular;  Laterality: N/A;  . THYROIDECTOMY     Social History   Socioeconomic History  . Marital status: Divorced    Spouse name: Not on file  . Number of children: Not on file  . Years of education: Not on file  . Highest education level: Not on file  Occupational History  . Not on file  Tobacco Use  . Smoking status: Former Smoker    Packs/day: 1.00    Years: 25.00    Pack years: 25.00    Start date: 19    Quit date: 1998    Years since quitting: 23.7  . Smokeless tobacco: Never Used  . Tobacco comment:  QUIT SMOKING IN 1998  Vaping Use  . Vaping Use: Never used  Substance and Sexual Activity  . Alcohol use: Yes    Comment: GLASS WINE W/ DINNER  . Drug use: No  . Sexual activity: Not on file  Other Topics Concern  . Not on file  Social History Narrative  . Not on file   Social Determinants of Health   Financial Resource Strain:   . Difficulty of Paying Living Expenses: Not on file  Food Insecurity:   . Worried About Charity fundraiser in the Last Year: Not on file  . Ran Out of Food in the Last Year: Not on file  Transportation Needs:   . Lack of Transportation (Medical): Not on file  . Lack of Transportation (Non-Medical): Not on file  Physical Activity:   . Days of Exercise per Week: Not on file  . Minutes of Exercise per Session: Not on file  Stress:   . Feeling of Stress : Not on file  Social Connections:   . Frequency of Communication with Friends and Family: Not on file  . Frequency of Social Gatherings with Friends and Family: Not on file  . Attends Religious Services:  Not on file  . Active Member of Clubs or Organizations: Not on file  . Attends Archivist Meetings: Not on file  . Marital Status: Not on file   Family History  Problem Relation Age of Onset  . Hypertension Mother   . Cancer Father        JAW BONE  . COPD Brother   . Atrial fibrillation Brother    Allergies  Allergen Reactions  . Sulfa Antibiotics Rash and Other (See Comments)    Childhood allergy  . Ciprocinonide [Fluocinolone] Rash  . Ciprofloxacin Rash   Prior to Admission medications   Medication Sig Start Date End Date Taking? Authorizing Provider  acyclovir (ZOVIRAX) 400 MG tablet Take 400 mg at bedtime by mouth.  02/05/14  Yes [provider]  alendronate (FOSAMAX) 70 MG tablet Take 70 mg by mouth every Sunday. Take with a full glass of water on an empty stomach.    Yes [provider]  Ascorbic Acid (VITAMIN C) 1000 MG tablet Take 1,000 mg by mouth daily.    Yes [provider]  azelastine (OPTIVAR) 0.05 % ophthalmic solution Place 2 drops into both eyes daily as needed. Inching eyes 04/02/18  Yes [provider]  Calcium Citrate-Vitamin D (CALCIUM CITRATE + D PO) Take 2 tablets by mouth at bedtime.    Yes [provider]  CARTIA XT 240 MG 24 hr capsule TAKE 1 CAPSULE BY MOUTH ONCE A DAY **PLEASE KEEP APPOINTMENT** 11/22/19  Yes Camnitz, Will Hassell Done, MD  chlorthalidone (HYGROTON) 25 MG tablet TAKE ONE TABLET BY MOUTH ONE TIME DAILY  11/05/19  Yes Camnitz, Will Hassell Done, MD  Coenzyme Q10 (CO Q-10) 100 MG CAPS Take 100 mg by mouth daily.    Yes [provider]  D-Ribose (RIBOSE, D,) POWD Take 2 g by mouth daily.    Yes [provider]  donepezil (ARICEPT) 10 MG tablet Take 1 tablet (10 mg total) by mouth daily. 03/28/20  Yes Cottle, Billey Co., MD  ELIQUIS 5 MG TABS tablet TAKE 1 TABLET BY MOUTH TWICE A DAY 05/04/20  Yes Camnitz, Will Hassell Done, MD  FANAPT 4 MG TABS tablet TAKE 1 TABLET BY MOUTH AT BEDTIME Patient taking differently: 1 mg daily 03/23/20  Yes Cottle, Billey Co., MD  fexofenadine (ALLEGRA) 180 MG tablet Take 180 mg by mouth daily.   Yes [provider]  fluticasone (FLONASE) 50 MCG/ACT nasal spray Place 2 sprays into both nostrils daily.   Yes [provider]  Ginkgo Biloba 120 MG TABS Take 120 mg by mouth daily.    Yes [provider]  lamoTRIgine (LAMICTAL) 25 MG tablet TAKE 1 TABLET EVERY DAY X14DAYS, 2 TABS DAILY X14DAYS, 3 TABS DAILY X14DAYS, THEN 4 TABS DAILY 08/01/20  Yes Cottle, Billey Co., MD  levothyroxine (SYNTHROID, LEVOTHROID) 100 MCG tablet Take 100 mcg by mouth daily before breakfast.  02/16/14  Yes [provider]  Magnesium 200 MG TABS Take 200 mg by mouth every evening.   Yes [provider]  Melatonin 3 MG TABS Take 3 mg by mouth at bedtime.   Yes [provider]  Multiple Vitamin (MULTIVITAMIN) capsule Take 1 capsule daily by mouth.     Yes [provider]  Omega-3 Fatty Acids (FISH OIL BURP-LESS) 1000 MG CAPS Take 3,000 mg by mouth 2 (two) times daily.    Yes [provider]  oxybutynin (DITROPAN-XL) 10 MG 24 hr tablet Take 15 mg by mouth daily.  Yes [provider]  rosuvastatin (CRESTOR) 10 MG tablet TAKE 1 TABLET BY MOUTH UP TO 4 TIMES A WEEK ON SUNDAY, MONDAY, [facility] AND FRIDAY 12/14/19  Yes Jerline Pain, MD  sodium chloride (OCEAN) 0.65 % SOLN nasal spray Place 2 sprays into both nostrils every evening.   Yes [provider]  temazepam (RESTORIL) 15 MG capsule Take 1 capsule (15 mg total) by mouth at bedtime. 07/05/20  Yes Cottle, Billey Co., MD  traMADol (ULTRAM) 50 MG tablet Take 1 tablet (50 mg total) by mouth 3 (three) times daily as needed. 03/24/19  Yes Sater, Nanine Means, MD  ziprasidone (GEODON) 40 MG capsule Take 1 capsule (40 mg total) by mouth daily. 03/28/20  Yes Cottle, Billey Co., MD     Positive ROS: Otherwise negative  All other systems have been reviewed and were otherwise negative with the exception of those mentioned in the HPI and as above.  Physical Exam: Constitutional: Alert, well-appearing, no acute distress Ears: External ears without lesions or tenderness.  She has a small amount of debris and wax in both ear canals that was cleaned with a suction.  The TMs are clear bilaterally.  There is no drainage and no clinical evidence of significant external otitis. Nasal: External nose without lesions.. Clear nasal passages Oral: Lips and gums without lesions. Tongue and palate mucosa without lesions. Posterior oropharynx clear. Neck: No palpable adenopathy or masses Respiratory: Breathing comfortably  Skin: No facial/neck lesions or rash noted.  Cerumen impaction removal  Date/Time: 08/24/2020 3:35 PM Performed by: Rozetta Nunnery, MD Authorized by: Rozetta Nunnery, MD   Consent:    Consent obtained:  Verbal   Consent given by:  Patient    Risks discussed:  Pain and bleeding Procedure details:    Location:  L ear and R ear   Procedure type: suction   Post-procedure details:    Inspection:  TM intact and canal normal   Hearing quality:  Improved   Patient tolerance of procedure:  Tolerated well, no immediate complications Comments:     TMs are clear bilaterally.  Minimal left ear canal erythema with no evidence of active or acute external otitis.  Applied CSF powder to the left ear canal.    Assessment: History of left external otitis She wears bilateral hearing aids.  Plan: Recommended use of alcohol vinegar ear rinses or use of the Cortisporin drops if she develops any pain or drainage from the ear. She inquires about cleaning her hearing aids with alcohol and I think this would be good. She will follow-up as needed   Radene Journey, MD

## 2020-08-26 ENCOUNTER — Other Ambulatory Visit: Payer: Self-pay | Admitting: Psychiatry

## 2020-08-26 DIAGNOSIS — F250 Schizoaffective disorder, bipolar type: Secondary | ICD-10-CM

## 2020-08-28 ENCOUNTER — Other Ambulatory Visit: Payer: Self-pay | Admitting: Psychiatry

## 2020-08-28 MED ORDER — LAMOTRIGINE 100 MG PO TABS: 100.0000 mg | ORAL_TABLET | Freq: Every day | ORAL | 0 refills | Status: DC

## 2020-08-28 NOTE — Telephone Encounter (Signed)
Refill at 25 mg or ?

## 2020-09-01 DIAGNOSIS — L304 Erythema intertrigo: Secondary | ICD-10-CM | POA: Diagnosis not present

## 2020-09-01 DIAGNOSIS — D485 Neoplasm of uncertain behavior of skin: Secondary | ICD-10-CM | POA: Diagnosis not present

## 2020-09-01 DIAGNOSIS — D225 Melanocytic nevi of trunk: Secondary | ICD-10-CM | POA: Diagnosis not present

## 2020-09-01 DIAGNOSIS — Z1283 Encounter for screening for malignant neoplasm of skin: Secondary | ICD-10-CM | POA: Diagnosis not present

## 2020-09-06 ENCOUNTER — Ambulatory Visit: Payer: PPO | Admitting: Psychiatry

## 2020-09-07 ENCOUNTER — Encounter: Payer: Self-pay | Admitting: Psychiatry

## 2020-09-07 ENCOUNTER — Other Ambulatory Visit: Payer: Self-pay

## 2020-09-07 ENCOUNTER — Ambulatory Visit (INDEPENDENT_AMBULATORY_CARE_PROVIDER_SITE_OTHER): Payer: PPO | Admitting: Psychiatry

## 2020-09-07 DIAGNOSIS — F5105 Insomnia due to other mental disorder: Secondary | ICD-10-CM

## 2020-09-07 DIAGNOSIS — F250 Schizoaffective disorder, bipolar type: Secondary | ICD-10-CM | POA: Diagnosis not present

## 2020-09-07 DIAGNOSIS — F99 Mental disorder, not otherwise specified: Secondary | ICD-10-CM

## 2020-09-07 DIAGNOSIS — G3184 Mild cognitive impairment, so stated: Secondary | ICD-10-CM

## 2020-09-07 MED ORDER — TEMAZEPAM 15 MG PO CAPS: 15.0000 mg | ORAL_CAPSULE | Freq: Every day | ORAL | 1 refills | Status: DC

## 2020-09-07 MED ORDER — ZIPRASIDONE HCL 40 MG PO CAPS: 40.0000 mg | ORAL_CAPSULE | Freq: Every day | ORAL | 3 refills | Status: DC

## 2020-09-07 MED ORDER — LAMOTRIGINE 100 MG PO TABS: 100.0000 mg | ORAL_TABLET | Freq: Every day | ORAL | 0 refills | Status: DC

## 2020-09-07 NOTE — Progress Notes (Signed)
Kaitlyn Hoover [id number] 1941/07/25 79 y.o.     Subjective:   Patient ID:  Kaitlyn Hoover is a 79 y.o. (DOB 12/03/1940) female.  Chief Complaint:  Chief Complaint  Patient presents with  . Follow-up  . Anxiety  . Sleeping Problem    Anxiety Patient reports no confusion, decreased concentration, dizziness, nervous/anxious behavior or suicidal ideas.    Depression        Associated symptoms include fatigue.  Associated symptoms include no decreased concentration and no suicidal ideas.  Past medical history includes anxiety.    Kaitlyn Hoover presents for follow-up of psych issures  seen November 2020  and no meds were changed.  She called in August 2020  wanting to increase donepezil from 5 to 10 mg daily.  That was done.  03/28/20 appt with the following noted: Deep depression March.  No pcpt other than being confined.  No seasonal changes.  No real problems with depression in years.  Did worry about it.  Even had SI without plan.  Then got better.  Totally better now spontaneously.  She wonders about reducing ziprasidone. Doing OK overall.  Bored.  Not depressed.  Sleeping well with temazepam 30 mg with 8-9 hours.  Doing Zoom with church and looks forward to it.  Otherwise watching TV. Tolerated Aricept and has seen benefit for recall.  No paranoia or fear.   No med changes  06/26/20 TC stating: She's referring to the Temazepam 30 mg, she stopped on Thursday and hasn't slept in  4 days. She asked if eventually she would start sleeping without? I discussed that you would might recommend she go down to 15 mg, she really doesn't want to take it at all but will do as instructed. She does really she has to get some sleep. She's tried Benadryl but not helped either.  Following info was given to her: She has tapered off temazepam before but had to restart it.  She is tried alternatives clonazepam, Tranxene, trazodone, hydroxyzine.  She prefers to avoid meds that could have weight  gain.  She could probably stop temazepam more quickly and easily and still sleep if she took half of the 25 mg quetiapine but it may still give her a hangover. The way we tapered her off temazepam the last time was in the traditional manner of reducing the dose.  She still will have some insomnia but it likely is to be more bearable.  I would suggest we send in a prescription for 15 mg capsules and she take that for 2 to 4 weeks and she can try stopping again if she wants to at that point. If she stops temazepam cold [place] as she is doing currently she is likely to have pretty serious insomnia and very fragmented sleep that will not respond to Benadryl for a period of 2 to 4 weeks.  She has chronic insomnia which is the reason she is taken the temazepam and I am not sure what her sleep will be like once she gets through the withdrawal phase of stopping the temazepam. Pt via TC stated: Rtc to [facility] and she would prefer the 15 mg capsule of Temazepam, take 1 at hs. Rx called [facility] her [facility] [address] Discussed her chronic insomnia and she verbalized understanding. Advised to call back with worsening condition or not effective for sleep.   07/05/20 appt with the following noted: Hell trying to stop temazepam 30 mg cold [place].  Couldn't sleep for days. Sleep  is fine on 15 mg HS however. No interest, not going to church since Pandemic.  Not worrying much about Covid.  Got vaccine.  Can't motivate herself to go get clothes or yard work.  Just sits and watches TV or reads.  Only social with friends once monthly.  Don't understand while not going to church.  Unmotivated and some sadness. No sig worry or fear other than about her mental health and no paranoia. Doctor said iron was low.  Recent UTI and ABx helped energy.  Donepezil has really helped with her memory she is well pleased.  Patient denies any suicidal ideation. Plan: Added lamotrigine for depression  09/07/20 appt with following noted: More  motivated and active and productive and less napping.  Big difference with addtion of lamotrigine.  Got benefit by 50 mg daily. NO SE. Depression resolved "definitely" No paranoia.  Nor fearfulness nor mood swings. Geodon and Fanapt doses are perfect.  Mild mouth movements but not severe. Sleep well with temazepam.  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  Past Psychiatric Medication Trials: Olanzapine haloperidol, Abilify, Latuda, Saphris, Geodon 60 , Fanapt 2 mg,  Depakote 250 mg 3 times d aily, benztropine, Artane,  clonazepam side effects, Tranxene increased appetite, temazepam,  trazodone, hydroxyzine She has been under the care of this office since 2003.  Review of Systems:  Review of Systems  Constitutional: Positive for fatigue.  HENT:       Jaw clenching stable.  Musculoskeletal: Positive for back pain and gait problem.  Neurological: Positive for facial asymmetry and weakness. Negative for dizziness and tremors.  Psychiatric/Behavioral: Positive for depression. Negative for agitation, behavioral problems, confusion, decreased concentration, dysphoric mood, hallucinations, self-injury, sleep disturbance and suicidal ideas. The patient is not nervous/anxious and is not hyperactive.     Medications: I have reviewed the patient's current medications.  Current Outpatient Medications  Medication Sig Dispense Refill  . acyclovir (ZOVIRAX) 400 MG tablet Take 400 mg at bedtime by mouth.     Marland Kitchen alendronate (FOSAMAX) 70 MG tablet Take 70 mg by mouth every Sunday. Take with a full glass of water on an empty stomach.     . Ascorbic Acid (VITAMIN C) 1000 MG tablet Take 1,000 mg by mouth daily.    Marland Kitchen azelastine (OPTIVAR) 0.05 % ophthalmic solution Place 2 drops into both eyes daily as needed. Inching eyes    . Calcium Citrate-Vitamin D (CALCIUM CITRATE + D PO) Take 2 tablets by mouth at bedtime.     Marland Kitchen CARTIA XT 240 MG 24 hr capsule TAKE 1 CAPSULE BY MOUTH ONCE A DAY **PLEASE KEEP  APPOINTMENT** 90 capsule 3  . chlorthalidone (HYGROTON) 25 MG tablet TAKE ONE TABLET BY MOUTH ONE TIME DAILY  90 tablet 3  . Coenzyme Q10 (CO Q-10) 100 MG CAPS Take 100 mg by mouth daily.     Marland Kitchen D-Ribose (RIBOSE, D,) POWD Take 2 g by mouth daily.     Marland Kitchen donepezil (ARICEPT) 10 MG tablet Take 1 tablet (10 mg total) by mouth daily. 90 tablet 3  . ELIQUIS 5 MG TABS tablet TAKE 1 TABLET BY MOUTH TWICE A DAY 180 tablet 1  . FANAPT 4 MG TABS tablet TAKE 1 TABLET BY MOUTH AT BEDTIME (Patient taking differently: 1 mg daily) 30 tablet 4  . fexofenadine (ALLEGRA) 180 MG tablet Take 180 mg by mouth daily.    . fluticasone (FLONASE) 50 MCG/ACT nasal spray Place 2 sprays into both nostrils daily.    Marland Kitchen  Ginkgo Biloba 120 MG TABS Take 120 mg by mouth daily.     Marland Kitchen lamoTRIgine (LAMICTAL) 100 MG tablet Take 1 tablet (100 mg total) by mouth daily. 90 tablet 0  . levothyroxine (SYNTHROID, LEVOTHROID) 100 MCG tablet Take 100 mcg by mouth daily before breakfast.     . Magnesium 200 MG TABS Take 200 mg by mouth every evening.    . Melatonin 3 MG TABS Take 3 mg by mouth at bedtime.    . Multiple Vitamin (MULTIVITAMIN) capsule Take 1 capsule daily by mouth.     . Omega-3 Fatty Acids (FISH OIL BURP-LESS) 1000 MG CAPS Take 3,000 mg by mouth 2 (two) times daily.     Marland Kitchen oxybutynin (DITROPAN-XL) 10 MG 24 hr tablet Take 15 mg by mouth daily.     . rosuvastatin (CRESTOR) 10 MG tablet TAKE 1 TABLET BY MOUTH UP TO 4 TIMES A WEEK ON SUNDAY, MONDAY, WEDNESDAY AND FRIDAY 90 tablet 3  . sodium chloride (OCEAN) 0.65 % SOLN nasal spray Place 2 sprays into both nostrils every evening.    . temazepam (RESTORIL) 15 MG capsule Take 1 capsule (15 mg total) by mouth at bedtime. 90 capsule 1  . traMADol (ULTRAM) 50 MG tablet Take 1 tablet (50 mg total) by mouth 3 (three) times daily as needed. 90 tablet 3  . ziprasidone (GEODON) 40 MG capsule Take 1 capsule (40 mg total) by mouth daily. 90 capsule 3   No current facility-administered  medications for this visit.    Medication Side Effects: Other: EPS prone.  Allergies:  Allergies  Allergen Reactions  . Sulfa Antibiotics Rash and Other (See Comments)    Childhood allergy  . Ciprocinonide [Fluocinolone] Rash  . Ciprofloxacin Rash    Past Medical History:  Diagnosis Date  . Anxiety   . Arthritis   . Atrial fibrillation with RVR ([place])   . Bipolar disorder ([place])   . Dysrhythmia   . GERD (gastroesophageal reflux disease)   . Hypertension   . Hypothyroidism   . Palpitations   . PAT (paroxysmal atrial tachycardia) ([place])   . Post-polio syndrome   . Sinus infection   . 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 Hypertension Mother   . Cancer Father        JAW BONE  . COPD Brother   . Atrial fibrillation Brother     Social History   Socioeconomic History  . Marital status: Divorced    Spouse name: Not on file  . Number of children: Not on file  . Years of education: Not on file  . Highest education level: Not on file  Occupational History  . Not on file  Tobacco Use  . Smoking status: Former Smoker    Packs/day: 1.00    Years: 25.00    Pack years: 25.00    Start date: 74    Quit date: 1998    Years since quitting: 23.8  . Smokeless tobacco: Never Used  . Tobacco comment: QUIT SMOKING IN 1998  Vaping Use  . Vaping Use: Never used  Substance and Sexual Activity  . Alcohol use: Yes    Comment: GLASS WINE W/ DINNER  . Drug use: No  . Sexual activity: Not on file  Other Topics Concern  . Not on file  Social History Narrative  . Not on file   Social Determinants of Health   Financial Resource Strain:   .  Difficulty of Paying Living Expenses: Not on file  Food Insecurity:   . Worried About Charity fundraiser in the Last Year: Not on file  . Ran Out of Food in the Last Year: Not on file  Transportation Needs:   . Lack of Transportation (Medical): Not on file  . Lack of Transportation  (Non-Medical): Not on file  Physical Activity:   . Days of Exercise per Week: Not on file  . Minutes of Exercise per Session: Not on file  Stress:   . Feeling of Stress : Not on file  Social Connections:   . Frequency of Communication with Friends and Family: Not on file  . Frequency of Social Gatherings with Friends and Family: Not on file  . Attends Religious Services: Not on file  . Active Member of Clubs or Organizations: Not on file  . Attends Archivist Meetings: Not on file  . Marital Status: Not on file  Intimate Partner Violence:   . Fear of Current or Ex-Partner: Not on file  . Emotionally Abused: Not on file  . Physically Abused: Not on file  . Sexually Abused: Not on file    Past Medical History, Surgical history, Social history, and Family history were reviewed and updated as appropriate.   Please see review of systems for further details on the patient's review from today.   Objective:   Physical Exam:  LMP  (LMP Unknown)   Physical Exam Constitutional:      General: She is not in acute distress. Musculoskeletal:        General: No deformity.  Neurological:     Mental Status: She is alert and oriented to person, place, and time.     Cranial Nerves: Dysarthria present.     Coordination: Coordination normal.     Comments: Mild jaw clenching  Psychiatric:        Attention and Perception: Attention and perception normal. She does not perceive auditory or visual hallucinations.        Mood and Affect: Mood is not anxious or depressed. Affect is not labile, blunt, angry or inappropriate.        Speech: Speech normal.        Behavior: Behavior normal. Behavior is not agitated or aggressive. Behavior is cooperative.        Thought Content: Thought content normal. Thought content is not paranoid or delusional. Thought content does not include homicidal or suicidal ideation. Thought content does not include homicidal or suicidal plan.        Cognition and  Memory: Cognition and memory normal.        Judgment: Judgment normal.     Comments: History of paranoia in remission Some worry over post polio complications   history of paranoia controlled.   Lab Review:     Component Value Date/Time   NA 140 09/21/2019 1215   K 3.7 09/21/2019 1215   CL 99 09/21/2019 1215   CO2 25 09/21/2019 1215   GLUCOSE 102 (H) 09/21/2019 1215   GLUCOSE 108 (H) 04/01/2018 1748   BUN 21 09/21/2019 1215   CREATININE 1.10 (H) 09/21/2019 1215   CALCIUM 9.5 09/21/2019 1215   PROT 7.9 04/02/2017 0946   PROT 7.2 02/12/2017 1013   ALBUMIN 3.2 (L) 04/02/2017 0946   ALBUMIN 4.0 02/12/2017 1013   AST 31 04/02/2017 0946   ALT 15 04/02/2017 0946   ALKPHOS 81 04/02/2017 0946   BILITOT 0.5 04/02/2017 0946   BILITOT 0.4 02/12/2017 1013  GFRNONAA 49 (L) 09/21/2019 1215   GFRAA 56 (L) 09/21/2019 1215       Component Value Date/Time   WBC 7.9 09/21/2019 1215   WBC 5.7 04/01/2018 1748   RBC 4.13 09/21/2019 1215   RBC 3.94 04/01/2018 1748   HGB 12.9 09/21/2019 1215   HCT 39.4 09/21/2019 1215   PLT 228 09/21/2019 1215   MCV 95 09/21/2019 1215   MCH 31.2 09/21/2019 1215   MCH 31.5 04/01/2018 1748   MCHC 32.7 09/21/2019 1215   MCHC 33.8 04/01/2018 1748   RDW 12.7 09/21/2019 1215   LYMPHSABS 1.8 04/11/2017 0911   MONOABS 0.5 04/02/2017 0946   EOSABS 0.2 04/11/2017 0911   BASOSABS 0.1 04/11/2017 0911    No results found for: POCLITH, LITHIUM   No results found for: PHENYTOIN, PHENOBARB, VALPROATE, CBMZ   .res Assessment: Plan:    Schizoaffective disorder, bipolar type ([place]) - Plan: lamoTRIgine (LAMICTAL) 100 MG tablet, ziprasidone (GEODON) 40 MG capsule  Insomnia due to other mental disorder - Plan: temazepam (RESTORIL) 15 MG capsule  Mild cognitive impairment   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 It does not appear severe enough to warrant additional medication.  Greater than 50% of face to face time with  patient was spent on counseling and coordination of care.  K has a long history of repeated episodes of paranoia associated with hypomania at times and at times independent of mood changes.  WE discussed her med-sensitivity.  As noted above she has tried multiple different antipsychotics and different antipsychotic combinations.  Discussed potential metabolic side effects associated with atypical antipsychotics, as well as potential risk for movement side effects. Advised pt to contact office if movement side effects occur.  She has mild dysarthria which may be related but it is no worse and it stable.  She is very specifically aware of the risk of tardive dyskinesia.   The unusual combination of antipsychotics is helpful in that the [place] actually offsets some of the dystonia that the Geodon tends to cause.  Lower dose of antipsychotics has led to worsening paranoia.  Her paranoia is well controlled at the current combination of medications.  Aricept 10 helped memory.  No SE. Still notices benefit.  Depression resolved with lamotrigine quickly.   We discussed the short-term risks associated with benzodiazepines including sedation and increased fall risk among others.  Discussed long-term side effect risk including dependence, potential withdrawal symptoms, and the potential eventual dose-related risk of dementia.  But recent studies from 2020 dispute this association between benzodiazepines and dementia risk. Newer studies in 2020 do not support an association with dementia.  We have tried to taper her off benzodiazepines for sleep and she was able to be off for a short period of time but then had recurrence of insomnia.  She is aware of the risks but the benefit for sleep is noteworthy and she is failed multiple other sleep meds.   ok with reduced temazepam 15 mg HS.  She has had worsening paranoia at lower doses of ziprasidone in the past.  Follow-up 6 months  Lynder Parents MD, DFAPA   Please  see After Visit Summary for patient specific instructions.  Future Appointments  Date Time Provider [place]  09/28/2020  1:00 PM Cottle, Billey Co., MD [facility] None  11/02/2020 11:30 AM Constance Haw, MD [facility]    No orders of the defined types were placed in this encounter.     -------------------------------

## 2020-09-08 DIAGNOSIS — D485 Neoplasm of uncertain behavior of skin: Secondary | ICD-10-CM | POA: Diagnosis not present

## 2020-09-08 DIAGNOSIS — L988 Other specified disorders of the skin and subcutaneous tissue: Secondary | ICD-10-CM | POA: Diagnosis not present

## 2020-09-18 ENCOUNTER — Telehealth: Payer: Self-pay

## 2020-09-18 NOTE — Telephone Encounter (Signed)
**Note De-Identified  Obfuscation** The pt left the provider page of her [place] Pt Asst application at the office. I have completed the page and emailed it to Dr [employer] nurse so she can obtain his signature, date it, and to let me know once it has been placed in the front office and that I will call the pt to let her know it is ready to be picked up.

## 2020-09-20 NOTE — Telephone Encounter (Signed)
Left detailed message informing pt that assistance paperwork signed by Dr. Curt Bears. Aware signed form left at front desk for pick up at her convenience.

## 2020-09-28 ENCOUNTER — Ambulatory Visit: Payer: PPO | Admitting: Psychiatry

## 2020-09-29 NOTE — Telephone Encounter (Signed)
**Note De-Identified  Obfuscation** Letter received from [facility] stating that they approved the pt for asst with her Eliquis. Approval is valid until 01/72/4195 Application Case#: [id number]  The letter states that they have notified the pt of this approval as well.

## 2020-10-06 DIAGNOSIS — D485 Neoplasm of uncertain behavior of skin: Secondary | ICD-10-CM | POA: Diagnosis not present

## 2020-10-06 DIAGNOSIS — D225 Melanocytic nevi of trunk: Secondary | ICD-10-CM | POA: Diagnosis not present

## 2020-10-20 DIAGNOSIS — L988 Other specified disorders of the skin and subcutaneous tissue: Secondary | ICD-10-CM | POA: Diagnosis not present

## 2020-10-20 DIAGNOSIS — D485 Neoplasm of uncertain behavior of skin: Secondary | ICD-10-CM | POA: Diagnosis not present

## 2020-10-30 ENCOUNTER — Other Ambulatory Visit: Payer: Self-pay | Admitting: Psychiatry

## 2020-10-30 MED ORDER — FANAPT 4 MG PO TABS: 4.0000 mg | ORAL_TABLET | Freq: Every day | ORAL | 4 refills | Status: DC

## 2020-11-02 ENCOUNTER — Ambulatory Visit: Payer: PPO | Admitting: Cardiology

## 2020-11-02 ENCOUNTER — Encounter: Payer: Self-pay | Admitting: Cardiology

## 2020-11-02 ENCOUNTER — Other Ambulatory Visit: Payer: Self-pay

## 2020-11-02 VITALS — BP 132/60 | HR 90 | Ht 62.0 in | Wt 135.0 lb

## 2020-11-02 DIAGNOSIS — I480 Paroxysmal atrial fibrillation: Secondary | ICD-10-CM

## 2020-11-02 LAB — BASIC METABOLIC PANEL
BUN/Creatinine Ratio: 13 (ref 12–28)
BUN: 15 mg/dL (ref 8–27)
CO2: 26 mmol/L (ref 20–29)
Calcium: 9.5 mg/dL (ref 8.7–10.3)
Chloride: 102 mmol/L (ref 96–106)
Creatinine, Ser: 1.15 mg/dL — ABNORMAL HIGH (ref 0.57–1.00)
GFR calc Af Amer: 53 mL/min/{1.73_m2} — ABNORMAL LOW (ref >59)
GFR calc non Af Amer: 46 mL/min/{1.73_m2} — ABNORMAL LOW (ref >59)
Glucose: 90 mg/dL (ref 65–99)
Potassium: 3.5 mmol/L (ref 3.5–5.2)
Sodium: 142 mmol/L (ref 134–144)

## 2020-11-02 LAB — CBC
Hematocrit: 34.4 % (ref 34.0–46.6)
Hemoglobin: 11.8 g/dL (ref 11.1–15.9)
MCH: 31.5 pg (ref 26.6–33.0)
MCHC: 34.3 g/dL (ref 31.5–35.7)
MCV: 92 fL (ref 79–97)
Platelets: 252 10*3/uL (ref 150–450)
RBC: 3.75 x10E6/uL — ABNORMAL LOW (ref 3.77–5.28)
RDW: 12 % (ref 11.7–15.4)
WBC: 5.8 10*3/uL (ref 3.4–10.8)

## 2020-11-02 NOTE — Progress Notes (Signed)
Electrophysiology Office Note   Date:  11/02/2020   ID:  Kaitlyn Hoover, DOB 1941/04/27, MRN [id number]  PCP:  [name], MD  Cardiologist:  Marlou Porch Primary Electrophysiologist:  Rondell Frick Meredith Leeds, MD    No chief complaint on file.    History of Present Illness: ISHITHA Hoover is a 79 y.o. female who is being seen today for the evaluation of SVT, atrial fibrillation at the request of [name], MD. Presenting today for electrophysiology evaluation.   She has a history of multiple ER visits due to atrial fibrillation.  She was initially put on 180 mg of diltiazem which improved her symptoms.  She also developed episodes of SVT.  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  She also had an AF ablation 04/25/2017.  Today, denies symptoms of palpitations, chest pain, shortness of breath, orthopnea, PND, lower extremity edema, claudication, dizziness, presyncope, syncope, bleeding, or neurologic sequela. The patient is tolerating medications without difficulties.  She overall feels well.  She has no chest pain or shortness of breath.  She is able do all of her daily activities without restriction.   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   Past Surgical History:  Procedure Laterality Date   ABDOMINAL HYSTERECTOMY     ABLATION OF DYSRHYTHMIC FOCUS  04/25/2017   ATRIAL FIBRILLATION ABLATION N/A 04/25/2017   Procedure: Atrial Fibrillation Ablation;  Surgeon: Constance Haw, MD;  Location: [facility];  Service: Cardiovascular;  Laterality: N/A;   BREAST EXCISIONAL BIOPSY Bilateral    benign   CATARACT EXTRACTION W/ INTRAOCULAR LENS   IMPLANT, BILATERAL     CERVICAL LAMINECTOMY     CESAREAN SECTION     X2    LEGS      AGE 46-11   SEVERAL SURGERIES FOR POLIO    SVT ABLATION N/A 11/26/2017   Procedure: SVT ABLATION;  Surgeon: Constance Haw, MD;  Location: [facility];  Service: Cardiovascular;  Laterality: N/A;   THYROIDECTOMY       Current Outpatient Medications  Medication Sig Dispense Refill   acyclovir (ZOVIRAX) 400 MG tablet Take 400 mg at bedtime by mouth.      alendronate (FOSAMAX) 70 MG tablet Take 70 mg by mouth every Sunday. Take with a full glass of water on an empty stomach.     Ascorbic Acid (VITAMIN C) 1000 MG tablet Take 1,000 mg by mouth daily.     azelastine (OPTIVAR) 0.05 % ophthalmic solution Place 2 drops into both eyes daily as needed. Inching eyes     Calcium Citrate-Vitamin D (CALCIUM CITRATE + D PO) Take 2 tablets by mouth at bedtime.      CARTIA XT 240 MG 24 hr capsule TAKE 1 CAPSULE BY MOUTH ONCE A DAY **PLEASE KEEP APPOINTMENT** 90 capsule 3   chlorthalidone (HYGROTON) 25 MG tablet TAKE ONE TABLET BY MOUTH ONE TIME DAILY  90 tablet 3   Coenzyme Q10 (CO Q-10) 100 MG CAPS Take 100 mg by mouth daily.      D-Ribose (RIBOSE, D,) POWD Take  2 g by mouth daily.      donepezil (ARICEPT) 10 MG tablet Take 1 tablet (10 mg total) by mouth daily. 90 tablet 3   ELIQUIS 5 MG TABS tablet TAKE 1 TABLET BY MOUTH TWICE A DAY 180 tablet 1   fexofenadine (ALLEGRA) 180 MG tablet Take 180 mg by mouth daily.     fluticasone (FLONASE) 50 MCG/ACT nasal spray Place 2 sprays into both nostrils daily.     Ginkgo Biloba 120 MG TABS Take 120 mg by mouth daily.      iloperidone (FANAPT) 4 MG TABS tablet Take 1 tablet (4 mg total) by mouth at bedtime. 30 tablet 4   lamoTRIgine (LAMICTAL) 100 MG tablet Take 1 tablet (100 mg total) by mouth daily. 90 tablet 0   levothyroxine (SYNTHROID, LEVOTHROID) 100 MCG tablet Take 100 mcg by mouth daily before breakfast.      Magnesium 200 MG TABS Take  200 mg by mouth every evening.     Melatonin 3 MG TABS Take 3 mg by mouth at bedtime.     Multiple Vitamin (MULTIVITAMIN) capsule Take 1 capsule daily by mouth.      Omega-3 Fatty Acids (FISH OIL BURP-LESS) 1000 MG CAPS Take 3,000 mg by mouth 2 (two) times daily.      oxybutynin (DITROPAN-XL) 10 MG 24 hr tablet Take 15 mg by mouth daily.      rosuvastatin (CRESTOR) 10 MG tablet TAKE 1 TABLET BY MOUTH UP TO 4 TIMES A WEEK ON SUNDAY, MONDAY, WEDNESDAY AND FRIDAY 90 tablet 3   sodium chloride (OCEAN) 0.65 % SOLN nasal spray Place 2 sprays into both nostrils every evening.     temazepam (RESTORIL) 15 MG capsule Take 1 capsule (15 mg total) by mouth at bedtime. 90 capsule 1   traMADol (ULTRAM) 50 MG tablet Take 1 tablet (50 mg total) by mouth 3 (three) times daily as needed. 90 tablet 3   ziprasidone (GEODON) 40 MG capsule Take 1 capsule (40 mg total) by mouth daily. 90 capsule 3   No current facility-administered medications for this visit.    Allergies:   Sulfa antibiotics, Ciprocinonide [fluocinolone], and Ciprofloxacin   Social History:  The patient  reports that she quit smoking about 23 years ago. She started smoking about 48 years ago. She has a 25.00 pack-year smoking history. She has never used smokeless tobacco. She reports current alcohol use. She reports that she does not use drugs.   Family History:  The patient's family history includes Atrial fibrillation in her brother; COPD in her brother; Cancer in her father; Hypertension in her mother.   ROS:  Please see the history of present illness.   Otherwise, review of systems is positive for none.   All other systems are reviewed and negative.   PHYSICAL EXAM: VS:  BP 132/60    Pulse 90    Ht 5\' 2"  (1.575 m)    Wt 135 lb (61.2 kg)    LMP  (LMP Unknown)    SpO2 95%    BMI 24.69 kg/m  , BMI Body mass index is 24.69 kg/m. GEN: Well nourished, well developed, in no acute distress  HEENT: normal  Neck: no JVD, carotid bruits, or  masses Cardiac: RRR; no murmurs, rubs, or gallops,no edema  Respiratory:  clear to auscultation bilaterally, normal work of breathing GI: soft, nontender, nondistended, + BS MS: no deformity or atrophy  Skin: warm and dry Neuro:  Strength and sensation are intact Psych: euthymic mood, full  affect  EKG:  EKG is ordered today. Personal review of the ekg ordered shows sinus rhythm, rate 69  Recent Labs: No results found for requested labs within last 8760 hours.    Lipid Panel     Component Value Date/Time   CHOL 136 02/12/2017 1013   TRIG 114 02/12/2017 1013   HDL 46 02/12/2017 1013   CHOLHDL 3.0 02/12/2017 1013   CHOLHDL 3.5 10/22/2016 1039   VLDL 19 10/22/2016 1039   LDLCALC 67 02/12/2017 1013     Wt Readings from Last 3 Encounters:  11/02/20 135 lb (61.2 kg)  09/21/19 139 lb (63 kg)  07/07/18 137 lb (62.1 kg)      Other studies Reviewed: Additional studies/ records that were reviewed today include: TTE 2015  Review of the above records today demonstrates:  - Left ventricle: The cavity size was normal. Wall thickness was normal. Systolic function was normal. The estimated ejection fraction was in the range of 60% to 65%. Wall motion was normal; there were no regional wall motion abnormalities. - Left atrium: The atrium was mildly dilated. - Atrial septum: No defect or patent foramen ovale was identified. - Pulmonary arteries: PA peak pressure: 32 mm Hg (S).  30 day monitor 01/08/18 - personally reviewed Sinus rhythm with occasional PACs Possible atrial fibrillation with artifact affecting interpretation  ASSESSMENT AND PLAN:  1.  Paroxysmal atrial fibrillation: Currently on Eliquis.  Status post ablation 03/25/2017 without obvious recurrence.  Monitoring for high risk medications.  CHA2DS2-VASc of 5.  She is remained in sinus rhythm.  She is comfortable with her overall control.  No changes.  2.  Hyperlipidemia: Currently well controlled   3.  Coronary  artery disease: Continue statin  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  No obvious recurrence.  5.  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  No changes.  Current medicines are reviewed at length with the patient today.   The patient does not have concerns regarding her medicines.  The following changes were made today: None  Labs/ tests ordered today include:  Orders Placed This Encounter  Procedures   CBC   Basic Metabolic Panel (BMET)   EKG 12-Lead     Disposition:   FU with Dimples Probus 12 months  Signed, Tierra Thoma Meredith Leeds, MD  11/02/2020 11:42 AM     [address] [place] [address] [phone] (office) [phone] (fax)

## 2020-11-02 NOTE — Patient Instructions (Signed)
Medication Instructions:  Your physician recommends that you continue on your current medications as directed. Please refer to the Current Medication list given to you today.  Labwork: Your physician recommends that you have work today - CBC and BMET  Testing/Procedures: none  Follow-Up: Your physician wants you to follow-up in: 1 YEAR with Dr. Curt Bears.  You will receive a reminder letter in the mail two months in advance. If you don't receive a letter, please call our office to schedule the follow-up appointment.   Any Other Special Instructions Will Be Listed Below (If Applicable).     If you need a refill on your cardiac medications before your next appointment, please call your pharmacy.

## 2020-11-13 ENCOUNTER — Other Ambulatory Visit: Payer: Self-pay | Admitting: Cardiology

## 2020-11-17 ENCOUNTER — Other Ambulatory Visit: Payer: Self-pay | Admitting: Cardiology

## 2020-12-01 ENCOUNTER — Other Ambulatory Visit: Payer: Self-pay | Admitting: Family Medicine

## 2020-12-01 ENCOUNTER — Other Ambulatory Visit: Payer: Self-pay | Admitting: Psychiatry

## 2020-12-01 DIAGNOSIS — F250 Schizoaffective disorder, bipolar type: Secondary | ICD-10-CM

## 2020-12-01 DIAGNOSIS — Z1231 Encounter for screening mammogram for malignant neoplasm of breast: Secondary | ICD-10-CM

## 2020-12-01 NOTE — Telephone Encounter (Signed)
Needs PA.  Pt takes this with extreme med intolerance of multiple others

## 2020-12-04 ENCOUNTER — Telehealth: Payer: Self-pay

## 2020-12-04 NOTE — Telephone Encounter (Signed)
Prior authorization has already been sent. Insurance is questioning duplicate medication with taking Fanapt as well. Pending response from [facility]  ID# [id number]

## 2020-12-04 NOTE — Telephone Encounter (Signed)
This patient is on [place] and Geodon for the following reason.  She is very med sensitive and failed several other antipsychotics.  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  She cannot tolerate either medication alone at higher doses sufficient to control her paranoia.  With higher dose Geodon she gets excessively parkinsonian.  With higher dose Fanapt she gets excessively sedated.  At the current dosages the Fanapt actually reduces the parkinsonian symptoms of the Geodon and her paranoia is  controlled.  If I need to write an appeal letter on this let me know

## 2020-12-04 NOTE — Telephone Encounter (Signed)
Prior Authorization submitted and approved for ZIPRASIDONE 40 MG CAPSULES effective 11/18/2020-11/17/2021 PA# [id number] with [employer] ID# [id number]

## 2020-12-18 DIAGNOSIS — H52202 Unspecified astigmatism, left eye: Secondary | ICD-10-CM | POA: Diagnosis not present

## 2020-12-18 DIAGNOSIS — H35373 Puckering of macula, bilateral: Secondary | ICD-10-CM | POA: Diagnosis not present

## 2020-12-18 DIAGNOSIS — Z0100 Encounter for examination of eyes and vision without abnormal findings: Secondary | ICD-10-CM | POA: Diagnosis not present

## 2020-12-18 DIAGNOSIS — H5211 Myopia, right eye: Secondary | ICD-10-CM | POA: Diagnosis not present

## 2021-01-11 DIAGNOSIS — R350 Frequency of micturition: Secondary | ICD-10-CM | POA: Diagnosis not present

## 2021-01-16 ENCOUNTER — Other Ambulatory Visit: Payer: Self-pay

## 2021-01-16 ENCOUNTER — Ambulatory Visit
Admission: RE | Admit: 2021-01-16 | Discharge: 2021-01-16 | Disposition: A | Discharge status: Home / Self Care | Payer: PPO | Source: Ambulatory Visit | Attending: Family Medicine | Admitting: Family Medicine

## 2021-01-16 DIAGNOSIS — Z1231 Encounter for screening mammogram for malignant neoplasm of breast: Secondary | ICD-10-CM | POA: Diagnosis not present

## 2021-01-23 ENCOUNTER — Other Ambulatory Visit: Payer: Self-pay | Admitting: Cardiology

## 2021-02-05 ENCOUNTER — Other Ambulatory Visit: Payer: Self-pay | Admitting: Cardiology

## 2021-02-05 NOTE — Telephone Encounter (Signed)
*  STAT* If patient is at the pharmacy, call can be transferred to refill team.   1. Which medications need to be refilled? (please list name of each medication and dose if known) rosuvastatin (CRESTOR) 10 MG tablet  2. Which pharmacy/location (including street and city if local pharmacy) is medication to be sent to? [id number] - [place], [place] - [address]  3. Do they need a 30 day or 90 day supply? 90   Patient refused to make appt with Dr. Marlou Porch. She states she does not see him anymore and only follows up with Dr. Curt Bears.

## 2021-02-07 MED ORDER — ROSUVASTATIN CALCIUM 10 MG PO TABS: ORAL_TABLET | ORAL | 0 refills | Status: DC

## 2021-02-07 NOTE — Telephone Encounter (Signed)
Dr. Curt Bears ok to refill statin?

## 2021-02-12 MED ORDER — ROSUVASTATIN CALCIUM 10 MG PO TABS: ORAL_TABLET | ORAL | 2 refills | Status: DC

## 2021-02-12 NOTE — Addendum Note (Signed)
Addended by: Stanton Kidney on: 02/12/2021 04:26 PM   Modules accepted: Orders

## 2021-02-12 NOTE — Telephone Encounter (Signed)
Ok to refill per [employer].  spoke to pt, refills sent to requested pharmacy

## 2021-02-22 DIAGNOSIS — D225 Melanocytic nevi of trunk: Secondary | ICD-10-CM | POA: Diagnosis not present

## 2021-02-27 DIAGNOSIS — F319 Bipolar disorder, unspecified: Secondary | ICD-10-CM | POA: Diagnosis not present

## 2021-02-27 DIAGNOSIS — I739 Peripheral vascular disease, unspecified: Secondary | ICD-10-CM | POA: Diagnosis not present

## 2021-02-27 DIAGNOSIS — I10 Essential (primary) hypertension: Secondary | ICD-10-CM | POA: Diagnosis not present

## 2021-02-27 DIAGNOSIS — E611 Iron deficiency: Secondary | ICD-10-CM | POA: Diagnosis not present

## 2021-02-27 DIAGNOSIS — E039 Hypothyroidism, unspecified: Secondary | ICD-10-CM | POA: Diagnosis not present

## 2021-02-27 DIAGNOSIS — A809 Acute poliomyelitis, unspecified: Secondary | ICD-10-CM | POA: Diagnosis not present

## 2021-02-27 DIAGNOSIS — E785 Hyperlipidemia, unspecified: Secondary | ICD-10-CM | POA: Diagnosis not present

## 2021-02-27 DIAGNOSIS — N183 Chronic kidney disease, stage 3 unspecified: Secondary | ICD-10-CM | POA: Diagnosis not present

## 2021-02-27 DIAGNOSIS — Z0000 Encounter for general adult medical examination without abnormal findings: Secondary | ICD-10-CM | POA: Diagnosis not present

## 2021-02-27 DIAGNOSIS — I4891 Unspecified atrial fibrillation: Secondary | ICD-10-CM | POA: Diagnosis not present

## 2021-03-04 ENCOUNTER — Other Ambulatory Visit: Payer: Self-pay | Admitting: Cardiology

## 2021-03-05 NOTE — Telephone Encounter (Signed)
Eliquis 5mg  refill request received. Patient is 80 years old, weight-61.2kg, Crea-1.15 on 11/02/20, Diagnosis-Afib, and last seen by Dr. Curt Bears on 11/02/2020. Dose is appropriate based on dosing criteria. Will send in refill to requested pharmacy.

## 2021-03-08 ENCOUNTER — Other Ambulatory Visit: Payer: Self-pay

## 2021-03-08 ENCOUNTER — Ambulatory Visit (INDEPENDENT_AMBULATORY_CARE_PROVIDER_SITE_OTHER): Payer: PPO | Admitting: Psychiatry

## 2021-03-08 ENCOUNTER — Encounter: Payer: Self-pay | Admitting: Psychiatry

## 2021-03-08 DIAGNOSIS — F250 Schizoaffective disorder, bipolar type: Secondary | ICD-10-CM | POA: Diagnosis not present

## 2021-03-08 DIAGNOSIS — F99 Mental disorder, not otherwise specified: Secondary | ICD-10-CM

## 2021-03-08 DIAGNOSIS — F5105 Insomnia due to other mental disorder: Secondary | ICD-10-CM | POA: Diagnosis not present

## 2021-03-08 DIAGNOSIS — G3184 Mild cognitive impairment, so stated: Secondary | ICD-10-CM | POA: Diagnosis not present

## 2021-03-08 MED ORDER — LAMOTRIGINE 100 MG PO TABS: 100.0000 mg | ORAL_TABLET | Freq: Every day | ORAL | 1 refills | Status: DC

## 2021-03-08 MED ORDER — TEMAZEPAM 15 MG PO CAPS: 15.0000 mg | ORAL_CAPSULE | Freq: Every day | ORAL | 1 refills | Status: DC

## 2021-03-08 MED ORDER — DONEPEZIL HCL 10 MG PO TABS: 10.0000 mg | ORAL_TABLET | Freq: Every day | ORAL | 3 refills | Status: DC

## 2021-03-08 NOTE — Progress Notes (Signed)
Kaitlyn Hoover [id number] 1941/03/16 80 y.o.     Subjective:   Patient ID:  Kaitlyn Hoover is a 80 y.o. (DOB [birth year]) female.  Chief Complaint:  Chief Complaint  Patient presents with  . Schizoaffective disorder, bipolar type ([place])  . Follow-up    Anxiety Patient reports no confusion, decreased concentration, dizziness, nervous/anxious behavior, palpitations or suicidal ideas.    Depression        Associated symptoms include fatigue.  Associated symptoms include no decreased concentration and no suicidal ideas.  Past medical history includes anxiety.    Kaitlyn Hoover presents for follow-up of psych issures  seen November 2020  and no meds were changed.  She called in August 2020  wanting to increase donepezil from 5 to 10 mg daily.  That was done.  03/28/20 appt with the following noted: Deep depression March.  No pcpt other than being confined.  No seasonal changes.  No real problems with depression in years.  Did worry about it.  Even had SI without plan.  Then got better.  Totally better now spontaneously.  She wonders about reducing ziprasidone. Doing OK overall.  Bored.  Not depressed.  Sleeping well with temazepam 30 mg with 8-9 hours.  Doing Zoom with church and looks forward to it.  Otherwise watching TV. Tolerated Aricept and has seen benefit for recall.  No paranoia or fear.   No med changes  06/26/20 TC stating: She's referring to the Temazepam 30 mg, she stopped on Thursday and hasn't slept in  4 days. She asked if eventually she would start sleeping without? I discussed that you would might recommend she go down to 15 mg, she really doesn't want to take it at all but will do as instructed. She does really she has to get some sleep. She's tried Benadryl but not helped either.  Following info was given to her: She has tapered off temazepam before but had to restart it.  She is tried alternatives clonazepam, Tranxene, trazodone, hydroxyzine.  She prefers to  avoid meds that could have weight gain.  She could probably stop temazepam more quickly and easily and still sleep if she took half of the 25 mg quetiapine but it may still give her a hangover. The way we tapered her off temazepam the last time was in the traditional manner of reducing the dose.  She still will have some insomnia but it likely is to be more bearable.  I would suggest we send in a prescription for 15 mg capsules and she take that for 2 to 4 weeks and she can try stopping again if she wants to at that point. If she stops temazepam cold [place] as she is doing currently she is likely to have pretty serious insomnia and very fragmented sleep that will not respond to Benadryl for a period of 2 to 4 weeks.  She has chronic insomnia which is the reason she is taken the temazepam and I am not sure what her sleep will be like once she gets through the withdrawal phase of stopping the temazepam. Pt via TC stated: Rtc to [facility] and she would prefer the 15 mg capsule of Temazepam, take 1 at hs. Rx called into her [facility] [address] Discussed her chronic insomnia and she verbalized understanding. Advised to call back with worsening condition or not effective for sleep.   07/05/20 appt with the following noted: Hell trying to stop temazepam 30 mg cold [place].  Couldn't sleep for days.  Sleep is fine on 15 mg HS however. No interest, not going to church since Pandemic.  Not worrying much about Covid.  Got vaccine.  Can't motivate herself to go get clothes or yard work.  Just sits and watches TV or reads.  Only social with friends once monthly.  Don't understand while not going to church.  Unmotivated and some sadness. No sig worry or fear other than about her mental health and no paranoia. Doctor said iron was low.  Recent UTI and ABx helped energy.  Donepezil has really helped with her memory she is well pleased.  Patient denies any suicidal ideation. Plan: Added lamotrigine for depression  09/07/20  appt with following noted: More motivated and active and productive and less napping.  Big difference with addtion of lamotrigine.  Got benefit by 50 mg daily. NO SE. Depression resolved "definitely" No paranoia.  Nor fearfulness nor mood swings. Geodon and Fanapt doses are perfect.  Mild mouth movements but not severe. Sleep well with temazepam. Plan: no med changes  03/08/21 appt noted: Just not having any trouble.  Got me on the right medication.  Occ a little down but it's ok. No SE with lamotrigine. Had a fall and hurt back.  Not much better and had her down some.  Don't get out much. 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  Not going to church bc it's hard. Sleep OK No paranoia.  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  Past Psychiatric Medication Trials: Olanzapine haloperidol, Abilify, Latuda, Saphris, Geodon 60 , Fanapt 2 mg,  Depakote 250 mg 3 times d aily, benztropine, Artane,  clonazepam side effects, Tranxene increased appetite, temazepam,  trazodone, hydroxyzine She has been under the care of this office since 2003.  Review of Systems:  Review of Systems  Constitutional: Positive for fatigue.  HENT:       Jaw clenching stable.  Cardiovascular: Negative for palpitations.  Musculoskeletal: Positive for back pain and gait problem.  Neurological: Positive for facial asymmetry and weakness. Negative for dizziness and tremors.  Psychiatric/Behavioral: Positive for depression. Negative for agitation, behavioral problems, confusion, decreased concentration, dysphoric mood, hallucinations, self-injury, sleep disturbance and suicidal ideas. The patient is not nervous/anxious and is not hyperactive.     Medications: I have reviewed the patient's current medications.  Current Outpatient Medications  Medication Sig Dispense Refill  . acyclovir (ZOVIRAX) 400 MG tablet Take 400 mg at bedtime by mouth.     Marland Kitchen alendronate (FOSAMAX) 70 MG tablet Take 70 mg by mouth every Sunday.  Take with a full glass of water on an empty stomach.    . Ascorbic Acid (VITAMIN C) 1000 MG tablet Take 1,000 mg by mouth daily.    Marland Kitchen azelastine (OPTIVAR) 0.05 % ophthalmic solution Place 2 drops into both eyes daily as needed. Inching eyes    . Calcium Citrate-Vitamin D (CALCIUM CITRATE + D PO) Take 2 tablets by mouth at bedtime.     . chlorthalidone (HYGROTON) 25 MG tablet TAKE 1 TABLET BY MOUTH EVERY DAY 90 tablet 3  . Coenzyme Q10 (CO Q-10) 100 MG CAPS Take 100 mg by mouth daily.     . cyclobenzaprine (FLEXERIL) 10 MG tablet Take 10 mg by mouth at bedtime.    . D-Ribose (RIBOSE, D,) POWD Take 2 g by mouth daily.     Marland Kitchen diltiazem (CARDIZEM CD) 240 MG 24 hr capsule TAKE 1 CAPSULE BY MOUTH EVERY DAY 90 capsule 3  . ELIQUIS 5 MG TABS  tablet TAKE 1 TABLET BY MOUTH TWICE A DAY 180 tablet 1  . fexofenadine (ALLEGRA) 180 MG tablet Take 180 mg by mouth daily.    . fluticasone (FLONASE) 50 MCG/ACT nasal spray Place 2 sprays into both nostrils daily.    . Ginkgo Biloba 120 MG TABS Take 120 mg by mouth daily.     Marland Kitchen iloperidone (FANAPT) 4 MG TABS tablet Take 1 tablet (4 mg total) by mouth at bedtime. (Patient taking differently: Take 1 mg by mouth at bedtime.) 30 tablet 4  . levothyroxine (SYNTHROID, LEVOTHROID) 100 MCG tablet Take 100 mcg by mouth daily before breakfast.     . Magnesium 200 MG TABS Take 200 mg by mouth every evening.    . Melatonin 3 MG TABS Take 3 mg by mouth at bedtime.    . Multiple Vitamin (MULTIVITAMIN) capsule Take 1 capsule daily by mouth.     . Omega-3 Fatty Acids (FISH OIL BURP-LESS) 1000 MG CAPS Take 3,000 mg by mouth 2 (two) times daily.     Marland Kitchen oxybutynin (DITROPAN-XL) 10 MG 24 hr tablet Take 15 mg by mouth daily.     . rosuvastatin (CRESTOR) 10 MG tablet TAKE 1 TABLET UP TO 4 TIMES A WEEK ON SUNDAY, MONDAY, [facility] AND Friday. Please make overdue appt with Dr. Marlou Porch before anymore refills. Thank you 2nd attempt 48 tablet 2  . sodium chloride (OCEAN) 0.65 % SOLN nasal spray  Place 2 sprays into both nostrils every evening.    . traMADol (ULTRAM) 50 MG tablet Take 1 tablet (50 mg total) by mouth 3 (three) times daily as needed. 90 tablet 3  . ziprasidone (GEODON) 40 MG capsule TAKE 1 CAPSULE BY MOUTH EVERY DAY 90 capsule 3  . donepezil (ARICEPT) 10 MG tablet Take 1 tablet (10 mg total) by mouth daily. 90 tablet 3  . lamoTRIgine (LAMICTAL) 100 MG tablet Take 1 tablet (100 mg total) by mouth daily. 90 tablet 1  . temazepam (RESTORIL) 15 MG capsule Take 1 capsule (15 mg total) by mouth at bedtime. 90 capsule 1   No current facility-administered medications for this visit.    Medication Side Effects: Other: EPS prone.  Allergies:  Allergies  Allergen Reactions  . Sulfa Antibiotics Rash and Other (See Comments)    Childhood allergy  . Ciprocinonide [Fluocinolone] Rash  . Ciprofloxacin Rash    Past Medical History:  Diagnosis Date  . Anxiety   . Arthritis   . Atrial fibrillation with RVR ([place])   . Bipolar disorder ([place])   . Dysrhythmia   . GERD (gastroesophageal reflux disease)   . Hypertension   . Hypothyroidism   . Palpitations   . PAT (paroxysmal atrial tachycardia) ([place])   . Post-polio syndrome   . Sinus infection   . 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 Hypertension Mother   . Cancer Father        JAW BONE  . COPD Brother   . Atrial fibrillation Brother     Social History   Socioeconomic History  . Marital status: Divorced    Spouse name: Not on file  . Number of children: Not on file  . Years of education: Not on file  . Highest education level: Not on file  Occupational History  . Not on file  Tobacco Use  . Smoking status: Former Smoker    Packs/day: 1.00    Years: 25.00  Pack years: 25.00    Start date: 59    Quit date: 1998    Years since quitting: 24.3  . Smokeless tobacco: Never Used  . Tobacco comment: QUIT SMOKING IN 1998  Vaping Use  . Vaping Use: Never used   Substance and Sexual Activity  . Alcohol use: Yes    Comment: GLASS WINE W/ DINNER  . Drug use: No  . Sexual activity: Not on file  Other Topics Concern  . Not on file  Social History Narrative  . Not on file   Social Determinants of Health   Financial Resource Strain: Not on file  Food Insecurity: Not on file  Transportation Needs: Not on file  Physical Activity: Not on file  Stress: Not on file  Social Connections: Not on file  Intimate Partner Violence: Not on file    Past Medical History, Surgical history, Social history, and Family history were reviewed and updated as appropriate.   Please see review of systems for further details on the patient's review from today.   Objective:   Physical Exam:  LMP  (LMP Unknown)   Physical Exam Constitutional:      General: She is not in acute distress. Musculoskeletal:        General: No deformity.  Neurological:     Mental Status: She is alert and oriented to person, place, and time.     Cranial Nerves: Dysarthria present.     Coordination: Coordination normal.     Comments: Mild jaw clenching  Psychiatric:        Attention and Perception: Attention and perception normal. She does not perceive auditory or visual hallucinations.        Mood and Affect: Mood is not anxious or depressed. Affect is not labile, blunt, angry or inappropriate.        Speech: Speech normal. Speech is not slurred.        Behavior: Behavior normal. Behavior is not agitated or aggressive. Behavior is cooperative.        Thought Content: Thought content normal. Thought content is not paranoid or delusional. Thought content does not include homicidal or suicidal ideation. Thought content does not include homicidal or suicidal plan.        Cognition and Memory: Cognition and memory normal.        Judgment: Judgment normal.     Comments: History of paranoia in remission Some worry over post polio complications   history of paranoia controlled.   Lab  Review:     Component Value Date/Time   NA 142 11/02/2020 1146   K 3.5 11/02/2020 1146   CL 102 11/02/2020 1146   CO2 26 11/02/2020 1146   GLUCOSE 90 11/02/2020 1146   GLUCOSE 108 (H) 04/01/2018 1748   BUN 15 11/02/2020 1146   CREATININE 1.15 (H) 11/02/2020 1146   CALCIUM 9.5 11/02/2020 1146   PROT 7.9 04/02/2017 0946   PROT 7.2 02/12/2017 1013   ALBUMIN 3.2 (L) 04/02/2017 0946   ALBUMIN 4.0 02/12/2017 1013   AST 31 04/02/2017 0946   ALT 15 04/02/2017 0946   ALKPHOS 81 04/02/2017 0946   BILITOT 0.5 04/02/2017 0946   BILITOT 0.4 02/12/2017 1013   GFRNONAA 46 (L) 11/02/2020 1146   GFRAA 53 (L) 11/02/2020 1146       Component Value Date/Time   WBC 5.8 11/02/2020 1146   WBC 5.7 04/01/2018 1748   RBC 3.75 (L) 11/02/2020 1146   RBC 3.94 04/01/2018 1748   HGB 11.8 11/02/2020  1146   HCT 34.4 11/02/2020 1146   PLT 252 11/02/2020 1146   MCV 92 11/02/2020 1146   MCH 31.5 11/02/2020 1146   MCH 31.5 04/01/2018 1748   MCHC 34.3 11/02/2020 1146   MCHC 33.8 04/01/2018 1748   RDW 12.0 11/02/2020 1146   LYMPHSABS 1.8 04/11/2017 0911   MONOABS 0.5 04/02/2017 0946   EOSABS 0.2 04/11/2017 0911   BASOSABS 0.1 04/11/2017 0911    No results found for: POCLITH, LITHIUM   No results found for: PHENYTOIN, PHENOBARB, VALPROATE, CBMZ   .res Assessment: Plan:    Schizoaffective disorder, bipolar type ([place]) - Plan: lamoTRIgine (LAMICTAL) 100 MG tablet  Insomnia due to other mental disorder - Plan: temazepam (RESTORIL) 15 MG capsule  Mild cognitive impairment - Plan: donepezil (ARICEPT) 10 MG tablet   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 It does not appear severe enough to warrant additional medication.  Greater than 50% of face to face time with patient was spent on counseling and coordination of care.  K has a long history of repeated episodes of paranoia associated with hypomania at times and at times independent of mood changes.  WE discussed  her med-sensitivity.  As noted above she has tried multiple different antipsychotics and different antipsychotic combinations.  Discussed potential metabolic side effects associated with atypical antipsychotics, as well as potential risk for movement side effects. Advised pt to contact office if movement side effects occur.  She has mild dysarthria which may be related but it is no worse and it stable.  She is very specifically aware of the risk of tardive dyskinesia.   The unusual combination of antipsychotics is helpful in that the Tamms actually offsets some of the dystonia that the Geodon tends to cause.  Lower dose of antipsychotics has led to worsening paranoia.  Her paranoia is well controlled at the current combination of medications.  Aricept 10 helped memory.  No SE. Still notices benefit.  Depression resolved with lamotrigine for the most part.  Disc option to increase but she'd rather not. Encourage her to try to get out as much as physical problems will let her.  We discussed the short-term risks associated with benzodiazepines including sedation and increased fall risk among others.  Discussed long-term side effect risk including dependence, potential withdrawal symptoms, and the potential eventual dose-related risk of dementia.  But recent studies from 2020 dispute this association between benzodiazepines and dementia risk. Newer studies in 2020 do not support an association with dementia.  We have tried to taper her off benzodiazepines for sleep and she was able to be off for a short period of time but then had recurrence of insomnia.  She is aware of the risks but the benefit for sleep is noteworthy and she is failed multiple other sleep meds.   ok with reduced temazepam 15 mg HS.  She has had worsening paranoia at lower doses of ziprasidone in the past.  Follow-up 6 months  Lynder Parents MD, DFAPA   Please see After Visit Summary for patient specific instructions.  No future  appointments.  No orders of the defined types were placed in this encounter.     -------------------------------

## 2021-03-13 ENCOUNTER — Emergency Department (HOSPITAL_COMMUNITY)
Admission: EM | Admit: 2021-03-13 | Discharge: 2021-03-13 | Disposition: A | Discharge status: Home / Self Care | Payer: PPO | Attending: Emergency Medicine | Admitting: Emergency Medicine

## 2021-03-13 ENCOUNTER — Encounter (HOSPITAL_COMMUNITY): Payer: Self-pay | Admitting: *Deleted

## 2021-03-13 ENCOUNTER — Emergency Department (HOSPITAL_COMMUNITY): Payer: PPO

## 2021-03-13 DIAGNOSIS — Z7901 Long term (current) use of anticoagulants: Secondary | ICD-10-CM | POA: Insufficient documentation

## 2021-03-13 DIAGNOSIS — I10 Essential (primary) hypertension: Secondary | ICD-10-CM | POA: Insufficient documentation

## 2021-03-13 DIAGNOSIS — I4891 Unspecified atrial fibrillation: Secondary | ICD-10-CM | POA: Diagnosis not present

## 2021-03-13 DIAGNOSIS — Z79899 Other long term (current) drug therapy: Secondary | ICD-10-CM | POA: Insufficient documentation

## 2021-03-13 DIAGNOSIS — M545 Low back pain, unspecified: Secondary | ICD-10-CM | POA: Diagnosis not present

## 2021-03-13 DIAGNOSIS — Z87891 Personal history of nicotine dependence: Secondary | ICD-10-CM | POA: Diagnosis not present

## 2021-03-13 DIAGNOSIS — E039 Hypothyroidism, unspecified: Secondary | ICD-10-CM | POA: Insufficient documentation

## 2021-03-13 NOTE — ED Provider Notes (Signed)
[facility] DEPT Provider Note   CSN: [id number] Arrival date & time: 03/13/21  1436     History Chief Complaint  Patient presents with  . Back Pain    Kaitlyn Hoover is a 80 y.o. female.  HPI 80 year old female with a history of atrial fibrillation, bipolar disorder, hypertension, hypothyroidism presents to the ER with complaints of back pain.  Had a fall approximately 2 weeks ago where she fell on her back.  She was seen by her PCP and was given some muscle relaxers and some Tylenol.  She states that her PCP reported if her pain does not improve she will will order an x-ray.  Patient reports muscle spasms improved with muscle relaxers, however pain persists.  She has no pain with movement, but does have pain in her low back with transferring, sitting up, lifting things.  Denies any loss of bowel bladder control, no foot drop.  No unintended weight loss or night sweats.  She has been taking Tylenol with little relief.    Past Medical History:  Diagnosis Date  . Anxiety   . Arthritis   . Atrial fibrillation with RVR ([place])   . Bipolar disorder ([place])   . Dysrhythmia   . GERD (gastroesophageal reflux disease)   . Hypertension   . Hypothyroidism   . Palpitations   . PAT (paroxysmal atrial tachycardia) ([place])   . Post-polio syndrome   . Sinus infection   . 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 Chronic midline back pain 03/24/2019  . 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/2018  . Schizoaffective disorder ([place]) 09/15/2018  . Atrial fibrillation ([place]) 04/25/2017  . Hyperlipidemia 03/11/2017  . Surgery, elective 08/02/2016  . Spondylolisthesis, lumbar region 07/31/2016  . Hyperlipemia 04/12/2016  . Gait disturbance 01/26/2016  . Spinal stenosis of lumbar region 01/26/2016  . Progressive focal motor weakness 01/12/2016  . Muscle atrophy 01/12/2016  . Urinary urgency 01/12/2016  . Encounter  for long-term (current) use of high-risk medication 03/03/2015  . Essential hypertension 03/03/2015  . Atrial fibrillation with RVR ([place]) 04/18/2014  . Palpitations 04/18/2014  . 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 Hypertension   . 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 ABDOMINAL HYSTERECTOMY    . ABLATION OF DYSRHYTHMIC FOCUS  04/25/2017  . ATRIAL FIBRILLATION ABLATION N/A 04/25/2017   Procedure: Atrial Fibrillation Ablation;  Surgeon: Constance Haw, MD;  Location: [facility];  Service: Cardiovascular;  Laterality: N/A;  . BREAST EXCISIONAL BIOPSY Bilateral    benign  . CATARACT EXTRACTION W/ INTRAOCULAR LENS  IMPLANT, BILATERAL    . CERVICAL LAMINECTOMY    . CESAREAN SECTION     X2   . LEGS      AGE [age]   SEVERAL SURGERIES FOR POLIO   . SVT ABLATION N/A 11/26/2017   Procedure: SVT ABLATION;  Surgeon: Constance Haw, MD;  Location: [facility];  Service: Cardiovascular;  Laterality: N/A;  . THYROIDECTOMY       OB History   No obstetric history on file.     Family History  Problem Relation Age of Onset  . Hypertension Mother   . Cancer Father        JAW BONE  . COPD Brother   . Atrial fibrillation Brother     Social History   Tobacco Use  .  Smoking status: Former Smoker    Packs/day: 1.00    Years: 25.00    Pack years: 25.00    Start date: 7    Quit date: 1998    Years since quitting: 24.3  . Smokeless tobacco: Never Used  . Tobacco comment: QUIT SMOKING IN 1998  Vaping Use  . Vaping Use: Never used  Substance Use Topics  . Alcohol use: Yes    Comment: GLASS WINE W/ DINNER  . Drug use: No    Home Medications Prior to Admission medications   Medication Sig Start Date End Date Taking? Authorizing Provider  acyclovir (ZOVIRAX) 400 MG tablet Take 400 mg at bedtime by mouth.  02/05/14   [provider]  alendronate (FOSAMAX) 70 MG tablet Take 70 mg by mouth every  Sunday. Take with a full glass of water on an empty stomach.    [provider]  Ascorbic Acid (VITAMIN C) 1000 MG tablet Take 1,000 mg by mouth daily.    [provider]  azelastine (OPTIVAR) 0.05 % ophthalmic solution Place 2 drops into both eyes daily as needed. Inching eyes 04/02/18   [provider]  Calcium Citrate-Vitamin D (CALCIUM CITRATE + D PO) Take 2 tablets by mouth at bedtime.     [provider]  chlorthalidone (HYGROTON) 25 MG tablet TAKE 1 TABLET BY MOUTH EVERY DAY 11/13/20   Camnitz, Ocie Doyne, MD  Coenzyme Q10 (CO Q-10) 100 MG CAPS Take 100 mg by mouth daily.     [provider]  cyclobenzaprine (FLEXERIL) 10 MG tablet Take 10 mg by mouth at bedtime.    [provider]  D-Ribose (RIBOSE, D,) POWD Take 2 g by mouth daily.     [provider]  diltiazem (CARDIZEM CD) 240 MG 24 hr capsule TAKE 1 CAPSULE BY MOUTH EVERY DAY 11/20/20   Camnitz, Ocie Doyne, MD  donepezil (ARICEPT) 10 MG tablet Take 1 tablet (10 mg total) by mouth daily. 03/08/21   Cottle, Billey Co., MD  ELIQUIS 5 MG TABS tablet TAKE 1 TABLET BY MOUTH TWICE A DAY 03/05/21   Camnitz, Ocie Doyne, MD  fexofenadine (ALLEGRA) 180 MG tablet Take 180 mg by mouth daily.    [provider]  fluticasone (FLONASE) 50 MCG/ACT nasal spray Place 2 sprays into both nostrils daily.    [provider]  Ginkgo Biloba 120 MG TABS Take 120 mg by mouth daily.     [provider]  iloperidone (FANAPT) 4 MG TABS tablet Take 1 tablet (4 mg total) by mouth at bedtime. Patient taking differently: Take 1 mg by mouth at bedtime. 10/30/20   Cottle, Billey Co., MD  lamoTRIgine (LAMICTAL) 100 MG tablet Take 1 tablet (100 mg total) by mouth daily. 03/08/21   Cottle, Billey Co., MD  levothyroxine (SYNTHROID, LEVOTHROID) 100 MCG tablet Take 100 mcg by mouth daily before breakfast.  02/16/14   [provider]  Magnesium 200 MG TABS Take 200 mg by mouth every  evening.    [provider]  Melatonin 3 MG TABS Take 3 mg by mouth at bedtime.    [provider]  Multiple Vitamin (MULTIVITAMIN) capsule Take 1 capsule daily by mouth.     [provider]  Omega-3 Fatty Acids (FISH OIL BURP-LESS) 1000 MG CAPS Take 3,000 mg by mouth 2 (two) times daily.     [provider]  oxybutynin (DITROPAN-XL) 10 MG 24 hr tablet Take 15 mg by mouth daily.  [provider]  rosuvastatin (CRESTOR) 10 MG tablet TAKE 1 TABLET UP TO 4 TIMES A WEEK ON SUNDAY, MONDAY, [facility] AND Friday. Please make overdue appt with Dr. Marlou Porch before anymore refills. Thank you 2nd attempt 02/12/21   Constance Haw, MD  sodium chloride (OCEAN) 0.65 % SOLN nasal spray Place 2 sprays into both nostrils every evening.    [provider]  temazepam (RESTORIL) 15 MG capsule Take 1 capsule (15 mg total) by mouth at bedtime. 03/08/21   Cottle, Billey Co., MD  traMADol (ULTRAM) 50 MG tablet Take 1 tablet (50 mg total) by mouth 3 (three) times daily as needed. 03/24/19   Sater, Nanine Means, MD  ziprasidone (GEODON) 40 MG capsule TAKE 1 CAPSULE BY MOUTH EVERY DAY 12/04/20   Cottle, Billey Co., MD    Allergies    Sulfa antibiotics, Ciprocinonide [fluocinolone], and Ciprofloxacin  Review of Systems   Review of Systems  Constitutional: Negative for chills and fever.  HENT: Negative for ear pain and sore throat.   Eyes: Negative for pain.  Cardiovascular: Negative for chest pain.  Gastrointestinal: Negative for abdominal pain and vomiting.  Genitourinary: Negative for dysuria and hematuria.  Musculoskeletal: Positive for back pain. Negative for arthralgias.  Skin: Negative for color change and rash.  Neurological: Negative for weakness and numbness.  All other systems reviewed and are negative.   Physical Exam Updated Vital Signs BP (!) 138/48 (BP Location: Right Arm)   Pulse 89   Temp 98.9 F (37.2 C) (Oral)   Resp 14   Ht 5\' 2"   (1.575 m)   Wt 61.2 kg   LMP  (LMP Unknown)   SpO2 100%   BMI 24.69 kg/m   Physical Exam Vitals and nursing note reviewed.  Constitutional:      General: She is not in acute distress.    Appearance: She is well-developed.  HENT:     Head: Normocephalic and atraumatic.  Eyes:     Conjunctiva/sclera: Conjunctivae normal.  Cardiovascular:     Rate and Rhythm: Normal rate and regular rhythm.     Heart sounds: No murmur heard.   Pulmonary:     Effort: Pulmonary effort is normal. No respiratory distress.     Breath sounds: Normal breath sounds.  Abdominal:     Palpations: Abdomen is soft.     Tenderness: There is no abdominal tenderness.  Musculoskeletal:        General: Tenderness present.     Cervical back: Neck supple.     Right lower leg: No edema.     Left lower leg: No edema.     Comments: No significant midline tenderness to the C, T-spine.  Mild midline tenderness to the L-spine with surrounding paraspinal muscle tenderness.  She does have evidence of a lower back surgery, reports this was a fusion.  No SI joint tenderness.  5/5 strength in upper and lower extremities bilaterally.  Sensations intact.  Skin:    General: Skin is warm and dry.     Capillary Refill: Capillary refill takes less than 2 seconds.     Findings: No erythema.  Neurological:     General: No focal deficit present.     Mental Status: She is alert and oriented to person, place, and time. Mental status is at baseline.  Psychiatric:        Mood and Affect: Mood normal.        Behavior: Behavior normal.     ED Results /  Procedures / Treatments   Labs (all labs ordered are listed, but only abnormal results are displayed) Labs Reviewed - No data to display  EKG None  Radiology DG Lumbar Spine Complete  Result Date: 03/13/2021 CLINICAL DATA:  Fall 02/23/2021 with persistent back pain. EXAM: LUMBAR SPINE - COMPLETE 4+ VIEW COMPARISON:  Radiograph 04/21/2017 FINDINGS: Unchanged from prior exam  with mild scoliotic curvature of the upper lumbar spine. Posterior rod with intrapedicular screws span L3 through L5, unchanged hardware. Similar anterolisthesis of L4 on L5. Similar anterolisthesis of L2 on L3. Interbody spacer at L3-L4. vertebral body heights are normal. No acute fracture. Disc space narrowing at L2-L3 with associated facet hypertrophy, slightly progressed. Sacroiliac joints are congruent. IMPRESSION: 1. No acute fracture of the lumbar spine. 2. Unchanged posterior fusion L3 through L5. Similar anterolisthesis of L2 on L3 and L4 on L5. 3. Worsening degenerative disc disease and facet hypertrophy at L2-L3 since 2018. Electronically Signed   By: Keith Rake M.D.   On: 03/13/2021 17:38    Procedures Procedures   Medications Ordered in ED Medications - No data to display  ED Course  I have reviewed the triage vital signs and the nursing notes.  Pertinent labs & imaging results that were available during my care of the patient were reviewed by me and considered in my medical decision making (see chart for details).    MDM Rules/Calculators/A&P                          80 year old female with back pain.  On arrival, vitals overall reassuring, afebrile, not tachycardic and tachypneic or hypoxic.  She is slightly hypertensive with a blood pressure 130/48, but no other abnormalities noted.  Physical exam with mild midline tenderness to the L-spine, and surrounding paraspinal muscle tenderness.  She is moving all 4 extremities without difficulty, ambulating about the room with no evidence of foot drop.  Neurovascularly intact.  Reports she needs an x-ray as her PCP stated that if her pain does not improve she will order an x-ray.  No signs of cauda equina, no history of malignancy, no signs of unintended weight loss, night sweats.  Plain films ordered.  No evidence of fractures.  She does have some evidence of degenerative changes from 2018.  Low suspicion for dissection, kidney  stones at this time.  Patient was informed of the reassuring findings.  Encouraged PCP follow-up, she may benefit from some physical therapy.  We discussed return precautions.  Stable for discharge  This was a shared visit with my supervising physician Dr. Eulis Foster who independently saw and evaluated the patient & provided guidance in evaluation/management/disposition ,in agreement with care    Final Clinical Impression(s) / ED Diagnoses Final diagnoses:  Acute bilateral low back pain without sciatica    Rx / DC Orders ED Discharge Orders    None       Lyndel Safe 03/13/21 1759    Daleen Bo, MD 03/13/21 2347

## 2021-03-13 NOTE — Discharge Instructions (Addendum)
He is continue to take Tylenol for pain.  Make sure to follow-up with your primary care doctor as you would likely benefit from some physical therapy.  Please return to the ER for any new or worsening symptoms.

## 2021-03-13 NOTE — ED Notes (Addendum)
An After Visit Summary was printed and given to the patient. Discharge instructions given and no further questions at this time.  Pt A&Ox4, pt ambulatory.

## 2021-03-13 NOTE — ED Triage Notes (Signed)
Pt fell 4/8 and was given muscle relaxer, told to have x-ray if not better in 2 weeks.Pain is not better she is here for x-ray. No change in bowel/blader habits.

## 2021-03-13 NOTE — ED Provider Notes (Signed)
  Face-to-face evaluation   History: She complains of back pain since a fall about 3 weeks ago.  She is concerned that something happened to her surgical site.  She states the pain radiates to both anterior thighs.  She denies fever or chills.  She denies bowel or urine incontinence.  Physical exam: Elderly patient alert and cooperative.  Back is mildly tender diffusely.  She has fairly good range of motion of the lower back and demonstrates normal range of motion of her arms and legs.   Medical screening examination/treatment/procedure(s) were conducted as a shared visit with non-physician practitioner(s) and myself.  I personally evaluated the patient during the encounter    Daleen Bo, MD 03/13/21 [phone]

## 2021-04-03 ENCOUNTER — Telehealth: Payer: Self-pay

## 2021-04-03 NOTE — Telephone Encounter (Signed)
**Note De-Identified  Obfuscation** The pt left the provider page of her [place] Pt Asst application at the office. I have completed the page and emailed it to Dr [employer] nurse so she can obtain his signature, date it, and to let me know once it has been placed in the front office and I will call the pt to let her know it is ready to be picked up.

## 2021-04-06 NOTE — Telephone Encounter (Signed)
Application signed by MD and placed at front desk for pt pick up

## 2021-04-11 NOTE — Telephone Encounter (Signed)
**Note De-Identified  Obfuscation** The pt is advised that the provider page of her BMSPAF application is ready for her to pick up at Dr [employer] office at [facility] on [address] in [place]. She states she will pick up today.

## 2021-04-12 ENCOUNTER — Telehealth: Payer: Self-pay | Admitting: Cardiology

## 2021-04-12 NOTE — Telephone Encounter (Signed)
PT is calling with questions about a form that was filled out for her eliquis

## 2021-04-12 NOTE — Telephone Encounter (Signed)
**Note De-Identified  Obfuscation** The pt was concerned that I only wrote enough refills for 3 months on her Eliquis RX part of her BMSPAF application. I explained that it was written for #180 which is a 3 month supply with 3 refills which is enough for a year supply. She apologized and stated she didn't pay attention to the quantity amount. She thanked me for calling her back to explain.

## 2021-04-26 NOTE — Telephone Encounter (Signed)
**Note De-Identified [name] Obfuscation** Letter received Kaitlyn Hoover fax from [facility] stating that they have approved the pt for asst with Eliquis until 11/17/2021. 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

## 2021-05-25 DIAGNOSIS — R195 Other fecal abnormalities: Secondary | ICD-10-CM | POA: Diagnosis not present

## 2021-05-25 DIAGNOSIS — I4891 Unspecified atrial fibrillation: Secondary | ICD-10-CM | POA: Diagnosis not present

## 2021-05-25 DIAGNOSIS — R1013 Epigastric pain: Secondary | ICD-10-CM | POA: Diagnosis not present

## 2021-05-25 DIAGNOSIS — K5900 Constipation, unspecified: Secondary | ICD-10-CM | POA: Diagnosis not present

## 2021-05-28 ENCOUNTER — Telehealth: Payer: Self-pay | Admitting: *Deleted

## 2021-05-28 NOTE — Telephone Encounter (Signed)
   [facility] Pre-operative Risk Assessment    Patient Name: JACOYA BAUMAN  DOB: Oct 08, 1941 MRN: [id number]  HEARTCARE STAFF:  - IMPORTANT!!!!!! Under Visit Info/Reason for Call, type in Other and utilize the format Clearance MM/DD/YY or Clearance TBD. Do not use dashes or single digits. - Please review there is not already an duplicate clearance open for this procedure. - If request is for dental extraction, please clarify the # of teeth to be extracted. - If the patient is currently at the dentist's office, call Pre-Op Callback Staff (MA/nurse) to input urgent request.  - If the patient is not currently in the dentist office, please route to the Pre-Op pool.  Request for surgical clearance:  What type of surgery is being performed? COLONOSCOPY AND ENDOSCOPY  When is this surgery scheduled? 06/19/21  What type of clearance is required (medical clearance vs. Pharmacy clearance to hold med vs. Both)? BOTH  Are there any medications that need to be held prior to surgery and how long?  [place] name and name of physician performing surgery? [facility]; DR. Cristina Gong  What is the office phone number? [phone]   7.   What is the office fax number? [phone]  8.   Anesthesia type (None, local, MAC, general) ? PROPOFOL   Julaine Hua 05/28/2021, 4:25 PM  _________________________________________________________________   (provider comments below)

## 2021-05-29 NOTE — Telephone Encounter (Signed)
   Name: Kaitlyn Hoover  DOB: 1941-05-27  MRN: [id number]   Primary Cardiologist: None  Chart reviewed as part of pre-operative protocol coverage. Patient was contacted 05/29/2021 in reference to pre-operative risk assessment for pending surgery as outlined below.  Kaitlyn Hoover was last seen on 11/02/2020 by Dr. Allegra Lai.  Since that day, Kaitlyn Hoover has done well, no angina.  She is able to complete 4 METS without difficulty.    Per pharmacy, patient can hold Eliquis for 2 days prior to procedure.    Therefore, based on ACC/AHA guidelines, the patient would be at acceptable risk for the planned procedure without further cardiovascular testing.   The patient was advised that if she develops new symptoms prior to surgery to contact our office to arrange for a follow-up visit, and she verbalized understanding.  I will route this recommendation to the requesting party via Epic fax function and remove from pre-op pool. Please call with questions.  Warren Lacy, PA-C 05/29/2021, 10:37 AM

## 2021-05-29 NOTE — Telephone Encounter (Signed)
Patient with diagnosis of A Fib on Eliquis for anticoagulation.    Procedure: COLONOSCOPY AND ENDOSCOPY Date of procedure: 06/19/21   CHA2DS2-VASc Score = 5  This indicates a 7.2% annual risk of stroke. The patient's score is based upon: CHF History: No HTN History: Yes Diabetes History: No Stroke History: No Vascular Disease History: Yes Age Score: 2 Gender Score: 1   CrCl 37 mL/min Platelet count 252K   Per office protocol, patient can hold Eliquis for 2 days prior to procedure.

## 2021-06-19 DIAGNOSIS — R195 Other fecal abnormalities: Secondary | ICD-10-CM | POA: Diagnosis not present

## 2021-06-19 DIAGNOSIS — R1013 Epigastric pain: Secondary | ICD-10-CM | POA: Diagnosis not present

## 2021-06-19 DIAGNOSIS — D123 Benign neoplasm of transverse colon: Secondary | ICD-10-CM | POA: Diagnosis not present

## 2021-06-19 DIAGNOSIS — K219 Gastro-esophageal reflux disease without esophagitis: Secondary | ICD-10-CM | POA: Diagnosis not present

## 2021-06-19 DIAGNOSIS — C184 Malignant neoplasm of transverse colon: Secondary | ICD-10-CM | POA: Diagnosis not present

## 2021-06-19 DIAGNOSIS — R110 Nausea: Secondary | ICD-10-CM | POA: Diagnosis not present

## 2021-06-19 DIAGNOSIS — K2930 Chronic superficial gastritis without bleeding: Secondary | ICD-10-CM | POA: Diagnosis not present

## 2021-06-19 DIAGNOSIS — D122 Benign neoplasm of ascending colon: Secondary | ICD-10-CM | POA: Diagnosis not present

## 2021-06-19 DIAGNOSIS — K222 Esophageal obstruction: Secondary | ICD-10-CM | POA: Diagnosis not present

## 2021-06-19 DIAGNOSIS — C189 Malignant neoplasm of colon, unspecified: Secondary | ICD-10-CM | POA: Diagnosis not present

## 2021-06-20 ENCOUNTER — Other Ambulatory Visit: Payer: Self-pay | Admitting: Gastroenterology

## 2021-06-20 ENCOUNTER — Other Ambulatory Visit (HOSPITAL_COMMUNITY): Payer: Self-pay | Admitting: Gastroenterology

## 2021-06-20 DIAGNOSIS — K6389 Other specified diseases of intestine: Secondary | ICD-10-CM

## 2021-06-20 DIAGNOSIS — R195 Other fecal abnormalities: Secondary | ICD-10-CM

## 2021-06-20 DIAGNOSIS — K625 Hemorrhage of anus and rectum: Secondary | ICD-10-CM

## 2021-06-20 DIAGNOSIS — R1013 Epigastric pain: Secondary | ICD-10-CM

## 2021-06-22 ENCOUNTER — Other Ambulatory Visit (HOSPITAL_COMMUNITY): Payer: Self-pay | Admitting: Gastroenterology

## 2021-06-22 DIAGNOSIS — R195 Other fecal abnormalities: Secondary | ICD-10-CM

## 2021-06-22 DIAGNOSIS — R1013 Epigastric pain: Secondary | ICD-10-CM | POA: Diagnosis not present

## 2021-06-22 DIAGNOSIS — C184 Malignant neoplasm of transverse colon: Secondary | ICD-10-CM | POA: Diagnosis not present

## 2021-06-22 DIAGNOSIS — K6389 Other specified diseases of intestine: Secondary | ICD-10-CM

## 2021-06-25 DIAGNOSIS — K2930 Chronic superficial gastritis without bleeding: Secondary | ICD-10-CM | POA: Diagnosis not present

## 2021-06-25 DIAGNOSIS — D122 Benign neoplasm of ascending colon: Secondary | ICD-10-CM | POA: Diagnosis not present

## 2021-06-25 DIAGNOSIS — K219 Gastro-esophageal reflux disease without esophagitis: Secondary | ICD-10-CM | POA: Diagnosis not present

## 2021-06-25 DIAGNOSIS — C189 Malignant neoplasm of colon, unspecified: Secondary | ICD-10-CM | POA: Diagnosis not present

## 2021-06-25 DIAGNOSIS — D123 Benign neoplasm of transverse colon: Secondary | ICD-10-CM | POA: Diagnosis not present

## 2021-06-26 ENCOUNTER — Ambulatory Visit (HOSPITAL_COMMUNITY)
Admission: RE | Admit: 2021-06-26 | Discharge: 2021-06-26 | Disposition: A | Discharge status: Home / Self Care | Payer: PPO | Source: Ambulatory Visit | Attending: Gastroenterology | Admitting: Gastroenterology

## 2021-06-26 ENCOUNTER — Other Ambulatory Visit: Payer: Self-pay

## 2021-06-26 DIAGNOSIS — R195 Other fecal abnormalities: Secondary | ICD-10-CM | POA: Insufficient documentation

## 2021-06-26 DIAGNOSIS — Z90710 Acquired absence of both cervix and uterus: Secondary | ICD-10-CM | POA: Diagnosis not present

## 2021-06-26 DIAGNOSIS — C189 Malignant neoplasm of colon, unspecified: Secondary | ICD-10-CM | POA: Diagnosis not present

## 2021-06-26 DIAGNOSIS — R1013 Epigastric pain: Secondary | ICD-10-CM | POA: Diagnosis not present

## 2021-06-26 DIAGNOSIS — I700 Atherosclerosis of aorta: Secondary | ICD-10-CM | POA: Diagnosis not present

## 2021-06-26 DIAGNOSIS — R918 Other nonspecific abnormal finding of lung field: Secondary | ICD-10-CM | POA: Diagnosis not present

## 2021-06-26 DIAGNOSIS — K6389 Other specified diseases of intestine: Secondary | ICD-10-CM | POA: Insufficient documentation

## 2021-06-26 DIAGNOSIS — K5730 Diverticulosis of large intestine without perforation or abscess without bleeding: Secondary | ICD-10-CM | POA: Diagnosis not present

## 2021-06-26 LAB — POCT I-STAT CREATININE: Creatinine, Ser: 1.1 mg/dL — ABNORMAL HIGH (ref 0.44–1.00)

## 2021-06-26 MED ORDER — IOHEXOL 350 MG/ML SOLN
60.0000 mL | Freq: Once | INTRAVENOUS | Status: AC | PRN
  Administered 2021-06-26: 60 mL via INTRAVENOUS

## 2021-07-03 ENCOUNTER — Ambulatory Visit: Payer: Self-pay | Admitting: General Surgery

## 2021-07-03 DIAGNOSIS — C184 Malignant neoplasm of transverse colon: Secondary | ICD-10-CM | POA: Diagnosis not present

## 2021-07-03 NOTE — H&P (Signed)
REFERRING PHYSICIAN:  Buccini, Youlanda Mighty, MD   PROVIDER:  Monico Blitz, MD   MRN: [id number] DOB: 1941/10/19 DATE OF ENCOUNTER: 07/03/2021   Subjective    Chief Complaint: No chief complaint on file.       History of Present Illness: Kaitlyn Hoover is a 80 y.o. female who is seen today as an office consultation at the request of Dr. Cristina Gong for evaluation of No chief complaint on file. 80 year old former smoker who presents to the office today for evaluation of a newly diagnosed colon cancer.  She has a history of multiple colon polyps but recently developed some rectal bleeding which prompted her to get a colonoscopy.  This showed a semicircumferential minimally obstructing mass in the transverse colon.  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  Tumor was tattooed and a clip was placed near the area.  She also had polyp noted at her previous polypectomy sites which was not amenable to endoscopic removal due to indistinct margins.  CT scans of the chest abdomen and pelvis show no signs of metastatic disease and a possible mass and clip noted at the hepatic flexure.  There are some tiny lung nodules noted that are indeterminate.  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  She is currently anticoagulated with Eliquis.  Surgical history is significant for C-section and hysterectomy.     Review of Systems: A complete review of systems was obtained from the patient.  I have reviewed this information and discussed as appropriate with the patient.  See HPI as well for other ROS.     Medical History: Past Medical History      Past Medical History:  Diagnosis Date   Arrhythmia     Arthritis     GERD (gastroesophageal reflux disease)     History of cancer     Hypertension     Polio     Thyroid disease             Patient Active Problem List  Diagnosis   Postpolio syndrome      Past Surgical History       Past Surgical  History:  Procedure Laterality Date   CESAREAN SECTION       HYSTERECTOMY VAGINAL       SPINE SURGERY       THYROIDECTOMY FOR REMOVAL REMAINING TISSUE            Allergies      Allergies  Allergen Reactions   Ciprofloxacin Unknown   Sulfabenzamide Unknown              Current Outpatient Medications on File Prior to Visit  Medication Sig Dispense Refill   diltiazem (TIAZAC) 240 MG ER capsule Take 1 capsule by mouth once daily       acyclovir (ZOVIRAX) 400 MG tablet Take 400 mg by mouth once daily       alendronate (FOSAMAX) 70 MG tablet Take 70 mg by mouth once a week       ascorbic acid, vitamin C, (VITAMIN C) 1000 MG tablet Take by mouth       atenolol (TENORMIN) 25 MG tablet Take 25 mg by mouth 2 (two) times daily.       azelastine (OPTIVAR) 0.05 % ophthalmic solution INSTILL 1 DROP INTO AFFECTED EYE TWICE A DAY FOR 30 DAYS       calcium citrate-vitamin D3 (CITRACAL) 250 mg-5 mcg (200 unit) tablet Take 2 tablets by mouth at bedtime  chlorthalidone 25 MG tablet Take 25 mg by mouth once daily       co-enzyme Q-10, ubiquinone, 100 mg capsule Take 100 mg by mouth once daily       cyclobenzaprine (FLEXERIL) 10 MG tablet TAKE 1 TABLET BY MOUTH EVERY DAY AT BEDTIME AS NEEDED FOR 30 DAYS       docosahexaenoic acid-epa 120-180 mg Cap Take by mouth       donepeziL (ARICEPT) 10 MG tablet Take 10 mg by mouth once daily       ELIQUIS 5 mg tablet Take 5 mg by mouth 2 (two) times daily       FANAPT 4 mg tablet Take 4 mg by mouth at bedtime       fexofenadine (ALLEGRA ALLERGY) 180 MG tablet Take 180 mg by mouth daily.       fluticasone propionate (FLONASE) 50 mcg/actuation nasal spray Place into one nostril       ginkgo biloba 120 mg Tab Take by mouth       lamoTRIgine (LAMICTAL) 100 MG tablet Take 100 mg by mouth once daily       levothyroxine (SYNTHROID) 100 MCG tablet TAKE 1 TABLET EVERY MORNING ON AN EMPTY STOMACH       levothyroxine (SYNTHROID, LEVOTHROID) 88 MCG tablet Take 88  mcg by mouth daily. Take on an empty stomach with a glass of water at least 30-60 minutes before breakfast. (Patient not taking: Reported on 07/03/2021)       magnesium 200 mg Take by mouth       melatonin 3 mg tablet Take by mouth       multivitamin capsule Take by mouth       omeprazole (PRILOSEC) 20 MG DR capsule Take 20 mg by mouth daily.       oxybutynin (DITROPAN-XL) 10 MG XL tablet Take by mouth       ribose, bulk, 100 % Powd Take by mouth       rosuvastatin (CRESTOR) 10 MG tablet TAKE 1 TABLET BY MOUTH UP TO 4 TIMES A WEEK ON SUNDAY, MONDAY,WEDNESDAY AND FRIDAY       sodium chloride (AYR) 0.65 % nasal drops Place into one nostril       temazepam (RESTORIL) 15 mg capsule Take 15 mg by mouth at bedtime       tolterodine (DETROL LA) 4 MG LA capsule Take 4 mg by mouth daily.       traMADoL (ULTRAM) 50 mg tablet 1 TABLET AS NEEDED ORALLY 3 TIMES DAILY AS NEEDED FOR PAIN 30 DAYS       valACYclovir (VALTREX) 500 MG tablet Take 500 mg by mouth 2 (two) times daily.       ziprasidone (GEODON) 40 MG capsule Take 40 mg by mouth once daily       ziprasidone (GEODON) 60 MG capsule Take 60 mg by mouth 2 (two) times daily with meals.        No current facility-administered medications on file prior to visit.      Family History       Family History  Problem Relation Age of Onset   High blood pressure (Hypertension) Mother     Cancer Father     Cancer Brother          Social History       Tobacco Use  Smoking Status Former Smoker  Smokeless Tobacco Never Used      Social History  Social History  Socioeconomic History   Marital status: Divorced  Tobacco Use   Smoking status: Former Smoker   Smokeless tobacco: Never Used  Scientific laboratory technician Use: Never used  Substance and Sexual Activity   Alcohol use: Not Currently   Drug use: Never        Objective:      There were no vitals filed for this visit.    Exam Gen: NAD Abd: soft, lower midline scar     Labs,  Imaging and Diagnostic Testing:   CT Chest, Abd and Pelvis 06/26/21   IMPRESSION: 1. There is eccentric wall thickening of the hepatic flexure of the colon, which may present primary colon malignancy identified by colonoscopy. 2. No definite evidence of metastatic disease in the chest, abdomen, or pelvis. 3. There are numerous tiny pulmonary nodules throughout the lungs, most concentrated in the lung apices, measuring no larger than 2 mm, for example in the superior segment left lower lobe. These are nonspecific and likely infectious or inflammatory, particularly smoking-related respiratory bronchiolitis, although metastatic disease is not strictly excluded. Attention on follow-up. 4. Descending and sigmoid diverticulosis without evidence of acute diverticulitis. 5. Status post hysterectomy.   Assessment and Plan:  Diagnoses and all orders for this visit:   Malignant neoplasm of transverse colon (CMS-HCC)     CT scan shows clip and possible mass near the hepatic flexure.  Other unresected polyps appear to be proximal to this.  We will plan on proceeding with a robotic assisted right colectomy.  We have discussed this in detail.  All questions were answered.  We will obtain cardiac clearance and proceed with scheduling surgery. The surgery and anatomy were described to the patient as well as the risks of surgery and the possible complications.  These include: Bleeding, deep abdominal infections and possible wound complications such as hernia and infection, damage to adjacent structures, leak of surgical connections, which can lead to other surgeries and possibly an ostomy, possible need for other procedures, such as abscess drains in radiology, possible prolonged hospital stay, possible diarrhea from removal of part of the colon, possible constipation from narcotics, possible bowel, bladder or sexual dysfunction if having rectal surgery, prolonged fatigue/weakness or appetite loss, possible  early recurrence of of disease, possible complications of their medical problems such as heart disease or arrhythmias or lung problems, death (less than 1%). I believe the patient understands and wishes to proceed with the surgery.

## 2021-07-05 ENCOUNTER — Telehealth: Payer: Self-pay | Admitting: *Deleted

## 2021-07-05 NOTE — Telephone Encounter (Signed)
   [facility] Pre-operative Risk Assessment    Patient Name: Kaitlyn Hoover  DOB: 1941/06/20 MRN: [id number]  HEARTCARE STAFF:  - IMPORTANT!!!!!! Under Visit Info/Reason for Call, type in Other and utilize the format Clearance MM/DD/YY or Clearance TBD. Do not use dashes or single digits. - Please review there is not already an duplicate clearance open for this procedure. - If request is for dental extraction, please clarify the # of teeth to be extracted. - If the patient is currently at the dentist's office, call Pre-Op Callback Staff (MA/nurse) to input urgent request.  - If the patient is not currently in the dentist office, please route to the Pre-Op pool.  Request for surgical clearance:  What type of surgery is being performed?  COLON SURGERY  When is this surgery scheduled?  TBD  What type of clearance is required (medical clearance vs. Pharmacy clearance to hold med vs. Both)?  BOTH  Are there any medications that need to be held prior to surgery and how long?  Kaitlyn Hoover name and name of physician performing surgery?  [facility] / DR. Marcello Hoover  What is the office phone number?  [phone]   7.   What is the office fax number?  [phone]  8.   Anesthesia type (None, local, MAC, general) ?  GENERAL    Kaitlyn Hoover 07/05/2021, 3:56 PM  _________________________________________________________________   (provider comments below)

## 2021-07-09 ENCOUNTER — Other Ambulatory Visit: Payer: PPO

## 2021-07-09 NOTE — Telephone Encounter (Signed)
Patient with diagnosis of atrial fibrillation on Eliquis for anticoagulation.    Procedure: colon surgery Date of procedure: TBD   CHA2DS2-VASc Score = 5  This indicates a 7.2% annual risk of stroke. The patient's score is based upon: CHF History: No HTN History: Yes Diabetes History: No Stroke History: No Vascular Disease History: Yes Age Score: 2 Gender Score: 1    CrCl 33 Platelet count 252  Per office protocol, patient can hold Eliquis for 2 days prior to procedure.   Patient will not need bridging with Lovenox (enoxaparin) around procedure.  Patient should restart Eliquis on the evening of procedure or day after, at discretion of procedure MD

## 2021-07-09 NOTE — Telephone Encounter (Signed)
   Name: Kaitlyn Hoover  DOB: 1940/11/19  MRN: [id number]   Primary Cardiologist: EP - Dr. Curt Bears  Chart reviewed as part of pre-operative protocol coverage. Patient was contacted 07/09/2021 in reference to pre-operative risk assessment for pending surgery as outlined below.  Kaitlyn Hoover was last seen on 10/2020 by Dr. Curt Bears with pertinent cardiac history of SVT, atrial fib, mild bilateral ICA stenosis by duplex 2018. Echo 2018 with normal LVF, grade 2 DD. RCRI 0.4% indicating low CV risk of complications with surgery. I reached out to patient for update on how she is doing. The patient affirms she has been doing well without any new cardiac symptoms. Able to exert >4 METS with heavy housework without angina or dyspnea. Therefore, based on ACC/AHA guidelines, the patient would be at acceptable risk for the planned procedure without further cardiovascular testing. The patient was advised that if she develops new symptoms prior to surgery to contact our office to arrange for a follow-up visit, and she verbalized understanding.  Pharmacy team reviewed anticoagulation - Per office protocol, patient can hold Eliquis for 2 days prior to procedure.   Patient will not need bridging with Lovenox (enoxaparin) around procedure.  Patient should restart Eliquis on the evening of procedure or day after, at discretion of procedure MD.  I will route this recommendation to the requesting party via [place] fax function and remove from pre-op pool. Please call with questions.  Charlie Pitter, PA-C 07/09/2021, 11:54 AM

## 2021-08-03 NOTE — Patient Instructions (Addendum)
DUE TO COVID-19 ONLY ONE VISITOR IS ALLOWED TO COME WITH YOU AND STAY IN THE WAITING ROOM ONLY DURING PRE OP AND PROCEDURE DAY OF SURGERY IF YOU ARE GOING HOME AFTER SURGERY. IF YOU ARE SPENDING THE NIGHT 2 PEOPLE MAY VISIT WITH YOU IN YOUR PRIVATE ROOM AFTER SURGERY UNTIL VISITING  HOURS ARE OVER AT 800 PM AND THE 2 VISITORS CANNOT SPEND THE NIGHT.   YOU NEED TO HAVE A COVID 19 TEST ON_9/26____THIS TEST MUST BE DONE BEFORE SURGERY,  COVID TESTING SITE  IS LOCATED AT [place], [place]. REMAIN IN YOUR CAR THIS IS A DRIVE UP TEST. AFTER YOUR COVID TEST PLEASE WEAR A MASK OUT IN PUBLIC AND SOCIAL DISTANCE AND [place] YOUR HANDS FREQUENTLY, ALSO ASK ALL YOUR CLOSE CONTACT PERSONS TO WEAR A MASK AND SOCIAL DISTANCE AND [place] THEIR HANDS FREQUENTLY ALSO.               Kaitlyn Hoover     Your procedure is scheduled on: 08/15/21   Report to [facility] Main  Entrance   Report to admitting at   10:45 AM     Call this number if you have problems the morning of surgery [phone]   Follow the instructions for the bowel prep from the Dr's office.   Drink plenty of fluids on the prep day to prevent dehydration     DRINK 2 PRESURGERY ENSURE DRINKS THE NIGHT BEFORE SURGERY AT 10:00 PM .  NO SOLIDS AFTER MIDNIGHT THE DAY PRIOR TO THE SURGERY.   NOTHING BY MOUTH EXCEPT CLEAR LIQUIDS UNTIL 10:00 am .  CLEAR LIQUID DIET   Foods Allowed                                                                     Foods Excluded                                                                                                   liquids that you cannot  Plain Jell-O any favor except red or purple                                           see through such as: Fruit ices (not with fruit pulp)                                     milk, soups, orange juice  Iced Popsicles                                    All solid food  Carbonated beverages, regular and diet                                     Cranberry, grape and apple juices Sports drinks like Gatorade Lightly seasoned clear broth or consume(fat free  Drink the last clear ensure  and have it finished by 10:00 am     Kaitlyn Hoover AND RINSE YOUR MOUTH OUT, NO CHEWING GUM CANDY OR MINTS.     Take these medicines the morning of surgery with A SIP OF WATER: Lamotragine, Ziprasidone, Diltiazem, Levothyroxine  Stop taking _Eliquis__________on _9/25/22_________as instructed by __Dr. Camnitz___________.   Marland Kitchen                                 You may not have any metal on your body including hair pins              piercings  Do not wear jewelry, make-up, lotions, powders or perfumes, deodorant             Do not wear nail polish on your fingernails.  Do not shave  48 hours prior to surgery.                 Do not bring valuables to the hospital. [place].  Contacts, dentures or bridgework may not be worn into surgery.  Leave suitcase in the car. After surgery it may be brought to your room.               [place] - Preparing for Surgery Before surgery, you can play an important role.  Because skin is not sterile, your skin needs to be as free of germs as possible.  You can reduce the number of germs on your skin by washing with CHG (chlorahexidine gluconate) soap before surgery.  CHG is an antiseptic cleaner which kills germs and bonds with the skin to continue killing germs even after washing. Please DO NOT use if you have an allergy to CHG or antibacterial soaps.  If your skin becomes reddened/irritated stop using the CHG and inform your nurse when you arrive at Short Stay. Do not shave (including legs and underarms) for at least 48 hours prior to the first CHG shower. Please follow these instructions carefully:  1.  Shower with CHG Soap the night before surgery and the  morning of Surgery.  2.  If you choose to wash your hair, wash your hair first  as usual with your  normal  shampoo.  3.  After you shampoo, rinse your hair and body thoroughly to remove the  shampoo.                            4.  Use CHG as you would any other liquid soap.  You can apply chg directly  to the skin and wash                       Gently with a scrungie or clean washcloth.  5.  Apply the CHG Soap to your body ONLY FROM THE NECK DOWN.   Do not use on face/ open  Wound or open sores. Avoid contact with eyes, ears mouth and genitals (private parts).                       Wash face,  Genitals (private parts) with your normal soap.             6.  Wash thoroughly, paying special attention to the area where your surgery  will be performed.  7.  Thoroughly rinse your body with warm water from the neck down.  8.  DO NOT shower/wash with your normal soap after using and rinsing off  the CHG Soap.                9.  Pat yourself dry with a clean towel.            10.  Wear clean pajamas.            11.  Place clean sheets on your bed the night of your first shower and do not  sleep with pets. Day of Surgery : Do not apply any lotions/deodorants the morning of surgery.  Please wear clean clothes to the hospital/surgery center.  FAILURE TO FOLLOW THESE INSTRUCTIONS MAY RESULT IN THE CANCELLATION OF YOUR SURGERY PATIENT SIGNATURE_________________________________  NURSE SIGNATURE__________________________________  ________________________________________________________________________   Adam Phenix  An incentive spirometer is a tool that can help keep your lungs clear and active. This tool measures how well you are filling your lungs with each breath. Taking long deep breaths may help reverse or decrease the chance of developing breathing (pulmonary) problems (especially infection) following: A long period of time when you are unable to move or be active. BEFORE THE PROCEDURE  If the spirometer includes an indicator to show your  best effort, your nurse or respiratory therapist will set it to a desired goal. If possible, sit up straight or lean slightly forward. Try not to slouch. Hold the incentive spirometer in an upright position. INSTRUCTIONS FOR USE  Sit on the edge of your bed if possible, or sit up as far as you can in bed or on a chair. Hold the incentive spirometer in an upright position. Breathe out normally. Place the mouthpiece in your mouth and seal your lips tightly around it. Breathe in slowly and as deeply as possible, raising the piston or the ball toward the top of the column. Hold your breath for 3-5 seconds or for as long as possible. Allow the piston or ball to fall to the bottom of the column. Remove the mouthpiece from your mouth and breathe out normally. Rest for a few seconds and repeat Steps 1 through 7 at least 10 times every 1-2 hours when you are awake. Take your time and take a few normal breaths between deep breaths. The spirometer may include an indicator to show your best effort. Use the indicator as a goal to work toward during each repetition. After each set of 10 deep breaths, practice coughing to be sure your lungs are clear. If you have an incision (the cut made at the time of surgery), support your incision when coughing by placing a pillow or rolled up towels firmly against it. Once you are able to get out of bed, walk around indoors and cough well. You may stop using the incentive spirometer when instructed by your caregiver.  RISKS AND COMPLICATIONS Take your time so you do not get dizzy or light-headed. If you are in pain, you may need to take or ask  for pain medication before doing incentive spirometry. It is harder to take a deep breath if you are having pain. AFTER USE Rest and breathe slowly and easily. It can be helpful to keep track of a log of your progress. Your caregiver can provide you with a simple table to help with this. If you are using the spirometer at home,  follow these instructions: [place] IF:  You are having difficultly using the spirometer. You have trouble using the spirometer as often as instructed. Your pain medication is not giving enough relief while using the spirometer. You develop fever of 100.5 F (38.1 C) or higher. SEEK IMMEDIATE MEDICAL CARE IF:  You cough up bloody sputum that had not been present before. You develop fever of 102 F (38.9 C) or greater. You develop worsening pain at or near the incision site. MAKE SURE YOU:  Understand these instructions. Will watch your condition. Will get help right away if you are not doing well or get worse. Document Released: 03/17/2007 Document Revised: 01/27/2012 Document Reviewed: 05/18/2007 [facility] Patient Information 2014 [place], [place].   ________________________________________________________________________

## 2021-08-06 ENCOUNTER — Encounter (HOSPITAL_COMMUNITY): Payer: Self-pay

## 2021-08-06 ENCOUNTER — Encounter (HOSPITAL_COMMUNITY)
Admission: RE | Admit: 2021-08-06 | Discharge: 2021-08-06 | Disposition: A | Discharge status: Home / Self Care | Payer: PPO | Source: Ambulatory Visit | Attending: General Surgery | Admitting: General Surgery

## 2021-08-06 ENCOUNTER — Other Ambulatory Visit: Payer: Self-pay

## 2021-08-06 DIAGNOSIS — E039 Hypothyroidism, unspecified: Secondary | ICD-10-CM | POA: Insufficient documentation

## 2021-08-06 DIAGNOSIS — Z01812 Encounter for preprocedural laboratory examination: Secondary | ICD-10-CM | POA: Diagnosis not present

## 2021-08-06 DIAGNOSIS — I4891 Unspecified atrial fibrillation: Secondary | ICD-10-CM | POA: Diagnosis not present

## 2021-08-06 DIAGNOSIS — I10 Essential (primary) hypertension: Secondary | ICD-10-CM | POA: Diagnosis not present

## 2021-08-06 DIAGNOSIS — Z87891 Personal history of nicotine dependence: Secondary | ICD-10-CM | POA: Insufficient documentation

## 2021-08-06 DIAGNOSIS — Z7901 Long term (current) use of anticoagulants: Secondary | ICD-10-CM | POA: Insufficient documentation

## 2021-08-06 DIAGNOSIS — C189 Malignant neoplasm of colon, unspecified: Secondary | ICD-10-CM | POA: Diagnosis not present

## 2021-08-06 DIAGNOSIS — Z79899 Other long term (current) drug therapy: Secondary | ICD-10-CM | POA: Insufficient documentation

## 2021-08-06 LAB — HEMOGLOBIN A1C
Hgb A1c MFr Bld: 5 % (ref 4.8–5.6)
Mean Plasma Glucose: 96.8 mg/dL

## 2021-08-06 LAB — CBC
HCT: 36 % (ref 36.0–46.0)
Hemoglobin: 11.9 g/dL — ABNORMAL LOW (ref 12.0–15.0)
MCH: 31.8 pg (ref 26.0–34.0)
MCHC: 33.1 g/dL (ref 30.0–36.0)
MCV: 96.3 fL (ref 80.0–100.0)
Platelets: 232 10*3/uL (ref 150–400)
RBC: 3.74 MIL/uL — ABNORMAL LOW (ref 3.87–5.11)
RDW: 13.1 % (ref 11.5–15.5)
WBC: 6 10*3/uL (ref 4.0–10.5)
nRBC: 0 % (ref 0.0–0.2)

## 2021-08-06 LAB — BASIC METABOLIC PANEL
Anion gap: 10 (ref 5–15)
BUN: 20 mg/dL (ref 8–23)
CO2: 29 mmol/L (ref 22–32)
Calcium: 9.2 mg/dL (ref 8.9–10.3)
Chloride: 104 mmol/L (ref 98–111)
Creatinine, Ser: 1.01 mg/dL — ABNORMAL HIGH (ref 0.44–1.00)
GFR, Estimated: 57 mL/min — ABNORMAL LOW (ref >60)
Glucose, Bld: 100 mg/dL — ABNORMAL HIGH (ref 70–99)
Potassium: 3.3 mmol/L — ABNORMAL LOW (ref 3.5–5.1)
Sodium: 143 mmol/L (ref 135–145)

## 2021-08-06 NOTE — Progress Notes (Addendum)
COVID test Completed:08/13/21   PCP - Dr. Beckie Salts LOV 02/27/21 Cardiologist - Dr. Curt Bears LOV 11/02/20  Chest x-ray - 06/27/21 EKG - 11/02/20-epic Stress Test - no ECHO - 04/11/17-epic Cardiac Cath - no Pacemaker/ICD device last checked:NA  Sleep Study - no CPAP -   Fasting Blood Sugar - NA Checks Blood Sugar _____ times a day  Blood Thinner Instructions:Eliquis/ Dr. Curt Bears Aspirin Instructions:Stop 2 days prior to DOS/ Dr. Curt Bears Last Dose:08/12/21  Anesthesia review: yes  Patient denies shortness of breath, fever, cough and chest pain at PAT appointment Yes. Pt has polio syndrome and  has weak legs and an unsteady gait. No SOB  Patient verbalized understanding of instructions that were given to them at the PAT appointment. Patient was also instructed that they will need to review over the PAT instructions again at home before surgery. Yes  Pt's ride was anxious to get to another appointment. Research was not able to talk with her at PAT.

## 2021-08-07 LAB — CEA: CEA: 5.4 ng/mL — ABNORMAL HIGH (ref 0.0–4.7)

## 2021-08-07 NOTE — Anesthesia Preprocedure Evaluation (Addendum)
Anesthesia Evaluation  Patient identified by MRN, date of birth, ID band Patient awake    Reviewed: Allergy & Precautions, NPO status , Patient's Chart, lab work & pertinent test results  History of Anesthesia Complications Negative for: history of anesthetic complications  Airway Mallampati: II  TM Distance: >3 FB Neck ROM: Full    Dental  (+) Edentulous Upper, Dental Advisory Given   Pulmonary neg pulmonary ROS, former smoker,    Pulmonary exam normal        Cardiovascular hypertension, Pt. on medications Normal cardiovascular exam+ dysrhythmias Atrial Fibrillation   Per cardiology preoperative evaluation 07/09/21, "Chart reviewed as part of pre-operative protocol coverage. Patient was contacted8/22/2022in reference to pre-operative risk assessment for pending surgery as outlined below. Aiyah Scarpelli Mathewswas last seen on 10/2020 by Dr. Curt Bears with pertinent cardiac history of SVT, atrial fib, mild bilateral ICA stenosis by duplex 2018. Echo 2018 with normal LVF, grade 2 DD. RCRI 0.4% indicating low CV risk of complications with surgery.I reached out to patient for update on howsheis doing. The patient affirms shehas been doing well without any new cardiac symptoms. Able to exert >4 METS with heavy housework without angina or dyspnea.Therefore, based on ACC/AHA guidelines, the patient would be at acceptable risk for the planned procedure without further cardiovascular testing. The patient was advised that ifshedevelops new symptoms prior to surgery to contact our office to arrange for a follow-up visit, and sheverbalized understanding.    Neuro/Psych PSYCHIATRIC DISORDERS Anxiety Bipolar Disorder Schizophrenia Post Polio Syndrome    GI/Hepatic Neg liver ROS, GERD  ,  Endo/Other  Hypothyroidism   Renal/GU negative Renal ROS     Musculoskeletal negative musculoskeletal ROS (+)   Abdominal   Peds  Hematology negative  hematology ROS (+)   Anesthesia Other Findings   Reproductive/Obstetrics                           Anesthesia Physical Anesthesia Plan  ASA: 3  Anesthesia Plan: General   Post-op Pain Management:    Induction: Intravenous  PONV Risk Score and Plan: 4 or greater and Ondansetron, Dexamethasone, Aprepitant and Treatment may vary due to age or medical condition  Airway Management Planned: Oral ETT  Additional Equipment:   Intra-op Plan:   Post-operative Plan: Extubation in OR  Informed Consent: I have reviewed the patients History and Physical, chart, labs and discussed the procedure including the risks, benefits and alternatives for the proposed anesthesia with the patient or authorized representative who has indicated his/her understanding and acceptance.     Dental advisory given  Plan Discussed with: Anesthesiologist and CRNA  Anesthesia Plan Comments: (See PAT note 08/06/2021, Konrad Felix Ward, PA-C)      Anesthesia Quick Evaluation

## 2021-08-07 NOTE — Progress Notes (Signed)
Anesthesia Chart Review   Case: [id number] Date/Time: 08/15/21 1244   Procedure: XI ROBOT ASSISTED LAPAROSCOPIC PARTIAL COLECTOMY   Anesthesia type: General   Pre-op diagnosis: colon cancer   Location: [facility] ROOM 02 / [facility]   Surgeons: Leighton Ruff, MD       [id number]:80 y.o. former smoker with h/o HTN, GERD, atrial fibrillation (on Eliquis), hypothyroidism, colon cancer scheduled for above procedure 2/42/6834 with Dr. Leighton Ruff.   Per cardiology preoperative evaluation 07/09/21, "Chart reviewed as part of pre-operative protocol coverage. Patient was contacted 07/09/2021 in reference to pre-operative risk assessment for pending surgery as outlined below.  Kaitlyn Hoover was last seen on 10/2020 by Dr. Curt Bears with pertinent cardiac history of SVT, atrial fib, mild bilateral ICA stenosis by duplex 2018. Echo 2018 with normal LVF, grade 2 DD. RCRI 0.4% indicating low CV risk of complications with surgery. I reached out to patient for update on how she is doing. The patient affirms she has been doing well without any new cardiac symptoms. Able to exert >4 METS with heavy housework without angina or dyspnea. Therefore, based on ACC/AHA guidelines, the patient would be at acceptable risk for the planned procedure without further cardiovascular testing. The patient was advised that if she develops new symptoms prior to surgery to contact our office to arrange for a follow-up visit, and she verbalized understanding.   Pharmacy team reviewed anticoagulation - Per office protocol, patient can hold Eliquis for 2 days prior to procedure.   Patient will not need bridging with Lovenox (enoxaparin) around procedure.  Patient should restart Eliquis on the evening of procedure or day after, at discretion of procedure MD."  Anticipate pt can proceed with planned procedure barring acute status change.   VS: BP (!) 145/46   Pulse 66   Temp 36.9 C (Oral)   Resp 20   Ht 5\' 2"  (1.575 m)   Wt 61.7 kg   LMP   (LMP Unknown)   SpO2 98%   BMI 24.87 kg/m   PROVIDERS: [name], MD is PCP   Allegra Lai, MD is Cardiologist  LABS: Labs reviewed: Acceptable for surgery. (all labs ordered are listed, but only abnormal results are displayed)  Labs Reviewed  BASIC METABOLIC PANEL - Abnormal; Notable for the following components:      Result Value   Potassium 3.3 (*)    Glucose, Bld 100 (*)    Creatinine, Ser 1.01 (*)    GFR, Estimated 57 (*)    All other components within normal limits  CBC - Abnormal; Notable for the following components:   RBC 3.74 (*)    Hemoglobin 11.9 (*)    All other components within normal limits  CEA - Abnormal; Notable for the following components:   CEA 5.4 (*)    All other components within normal limits  HEMOGLOBIN A1C  TYPE AND SCREEN     IMAGES:   EKG: 11/02/2020 Rate 69 bpm   CV: Echo 04/11/2017 - Left ventricle: The cavity size was normal. Wall thickness was    normal. Systolic function was normal. The estimated ejection    fraction was in the range of 60% to 65%. Wall motion was normal;    there were no regional wall motion abnormalities. Features are    consistent with a pseudonormal left ventricular filling pattern,    with concomitant abnormal relaxation and increased filling    pressure (grade 2 diastolic dysfunction).  - Aortic valve: There was no stenosis.  - Mitral  valve: Mildly calcified annulus. There was trivial    regurgitation.  - Left atrium: The atrium was moderately dilated.  - Right ventricle: The cavity size was normal. Systolic function    was normal.  - Tricuspid valve: Peak RV-RA gradient (S): 28 mm Hg.  - Pulmonary arteries: PA peak pressure: 31 mm Hg (S).  - Inferior vena cava: The vessel was normal in size. The    respirophasic diameter changes were in the normal range (>= 50%),    consistent with normal central venous pressure.  - Pericardium, extracardiac: A trivial pericardial effusion was    identified.   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    Past Surgical History:  Procedure Laterality Date   ABDOMINAL HYSTERECTOMY     ATRIAL FIBRILLATION ABLATION N/A 04/25/2017   Procedure: Atrial Fibrillation Ablation;  Surgeon: Constance Haw, MD;  Location: [facility];  Service: Cardiovascular;  Laterality: N/A;   BREAST EXCISIONAL BIOPSY Bilateral    benign   CATARACT EXTRACTION W/ INTRAOCULAR LENS  IMPLANT, BILATERAL     CERVICAL LAMINECTOMY     CESAREAN SECTION     X2    LEGS      AGE 23-11   SEVERAL SURGERIES FOR POLIO    SVT ABLATION N/A 11/26/2017   Procedure: SVT ABLATION;  Surgeon: Constance Haw, MD;  Location: [facility];  Service: Cardiovascular;  Laterality: N/A;   THYROIDECTOMY      MEDICATIONS:  acyclovir (ZOVIRAX) 400 MG tablet   alendronate (FOSAMAX) 70 MG tablet   Ascorbic Acid (VITAMIN C) 1000 MG tablet   azelastine (OPTIVAR) 0.05 % ophthalmic solution   b complex vitamins capsule   Calcium Citrate-Vitamin D (CALCIUM CITRATE + D PO)   chlorthalidone (HYGROTON) 25 MG tablet   cholecalciferol (VITAMIN D) 25 MCG (1000 UNIT) tablet   Coenzyme Q10 (CO Q-10) 100 MG CAPS   D-Ribose (RIBOSE, D,) POWD   diltiazem (CARDIZEM CD) 240 MG 24 hr capsule   donepezil (ARICEPT) 10 MG tablet   ELIQUIS 5 MG TABS tablet   ferrous sulfate 324 MG TBEC   fexofenadine (ALLEGRA) 180 MG tablet   fluticasone (FLONASE) 50 MCG/ACT nasal spray   GAVILAX 17 GM/SCOOP powder   Ginkgo Biloba 120 MG TABS   iloperidone (FANAPT) 4 MG TABS tablet   lamoTRIgine (LAMICTAL) 100 MG tablet   levothyroxine (SYNTHROID, LEVOTHROID) 100 MCG tablet   MAGNESIUM  PO   Melatonin 3 MG TABS   Multiple Vitamin (MULTIVITAMIN) capsule   Omega-3 Fatty Acids (FISH OIL BURP-LESS) 1200 MG CAPS   oxybutynin (DITROPAN XL) 15 MG 24 hr tablet   pantoprazole (PROTONIX) 40 MG tablet   rosuvastatin (CRESTOR) 10 MG tablet   sodium chloride (OCEAN) 0.65 % SOLN nasal spray   temazepam (RESTORIL) 15 MG capsule   traMADol (ULTRAM) 50 MG tablet   Turmeric 500 MG CAPS   vitamin B-12 (CYANOCOBALAMIN) 1000 MCG tablet   ziprasidone (GEODON) 40 MG capsule   No current facility-administered medications for this encounter.      Konrad Felix  Ward, PA-C [facility] [phone]

## 2021-08-13 ENCOUNTER — Other Ambulatory Visit: Payer: Self-pay | Admitting: General Surgery

## 2021-08-14 LAB — SARS CORONAVIRUS 2 (TAT 6-24 HRS): SARS Coronavirus 2: NEGATIVE

## 2021-08-15 ENCOUNTER — Encounter (HOSPITAL_COMMUNITY)
Admission: RE | Disposition: A | Discharge status: Home Health | Payer: Self-pay | Source: Home / Self Care | Attending: General Surgery

## 2021-08-15 ENCOUNTER — Inpatient Hospital Stay (HOSPITAL_COMMUNITY): Payer: PPO | Admitting: Physician Assistant

## 2021-08-15 ENCOUNTER — Other Ambulatory Visit: Payer: Self-pay

## 2021-08-15 ENCOUNTER — Encounter (HOSPITAL_COMMUNITY): Payer: Self-pay | Admitting: General Surgery

## 2021-08-15 ENCOUNTER — Inpatient Hospital Stay (HOSPITAL_COMMUNITY): Payer: PPO | Admitting: Anesthesiology

## 2021-08-15 ENCOUNTER — Inpatient Hospital Stay (HOSPITAL_COMMUNITY)
Admission: RE | Admit: 2021-08-15 | Discharge: 2021-08-23 | DRG: 329 | Disposition: A | Discharge status: Home Health | Payer: PPO | Attending: General Surgery | Admitting: General Surgery

## 2021-08-15 DIAGNOSIS — Z7901 Long term (current) use of anticoagulants: Secondary | ICD-10-CM

## 2021-08-15 DIAGNOSIS — E876 Hypokalemia: Secondary | ICD-10-CM | POA: Diagnosis not present

## 2021-08-15 DIAGNOSIS — Z7983 Long term (current) use of bisphosphonates: Secondary | ICD-10-CM

## 2021-08-15 DIAGNOSIS — Z86010 Personal history of colonic polyps: Secondary | ICD-10-CM

## 2021-08-15 DIAGNOSIS — E871 Hypo-osmolality and hyponatremia: Secondary | ICD-10-CM | POA: Diagnosis not present

## 2021-08-15 DIAGNOSIS — E785 Hyperlipidemia, unspecified: Secondary | ICD-10-CM | POA: Diagnosis not present

## 2021-08-15 DIAGNOSIS — D122 Benign neoplasm of ascending colon: Secondary | ICD-10-CM | POA: Diagnosis not present

## 2021-08-15 DIAGNOSIS — Z882 Allergy status to sulfonamides status: Secondary | ICD-10-CM

## 2021-08-15 DIAGNOSIS — E039 Hypothyroidism, unspecified: Secondary | ICD-10-CM | POA: Diagnosis not present

## 2021-08-15 DIAGNOSIS — C182 Malignant neoplasm of ascending colon: Secondary | ICD-10-CM | POA: Diagnosis not present

## 2021-08-15 DIAGNOSIS — K6389 Other specified diseases of intestine: Secondary | ICD-10-CM | POA: Diagnosis not present

## 2021-08-15 DIAGNOSIS — J9589 Other postprocedural complications and disorders of respiratory system, not elsewhere classified: Secondary | ICD-10-CM | POA: Diagnosis not present

## 2021-08-15 DIAGNOSIS — Z881 Allergy status to other antibiotic agents status: Secondary | ICD-10-CM | POA: Diagnosis not present

## 2021-08-15 DIAGNOSIS — R238 Other skin changes: Secondary | ICD-10-CM | POA: Diagnosis not present

## 2021-08-15 DIAGNOSIS — E890 Postprocedural hypothyroidism: Secondary | ICD-10-CM | POA: Diagnosis present

## 2021-08-15 DIAGNOSIS — K567 Ileus, unspecified: Secondary | ICD-10-CM | POA: Diagnosis not present

## 2021-08-15 DIAGNOSIS — R140 Abdominal distension (gaseous): Secondary | ICD-10-CM | POA: Diagnosis not present

## 2021-08-15 DIAGNOSIS — C779 Secondary and unspecified malignant neoplasm of lymph node, unspecified: Secondary | ICD-10-CM | POA: Diagnosis not present

## 2021-08-15 DIAGNOSIS — K9189 Other postprocedural complications and disorders of digestive system: Secondary | ICD-10-CM

## 2021-08-15 DIAGNOSIS — K219 Gastro-esophageal reflux disease without esophagitis: Secondary | ICD-10-CM | POA: Diagnosis present

## 2021-08-15 DIAGNOSIS — Z90710 Acquired absence of both cervix and uterus: Secondary | ICD-10-CM

## 2021-08-15 DIAGNOSIS — Z87891 Personal history of nicotine dependence: Secondary | ICD-10-CM

## 2021-08-15 DIAGNOSIS — M1990 Unspecified osteoarthritis, unspecified site: Secondary | ICD-10-CM | POA: Diagnosis not present

## 2021-08-15 DIAGNOSIS — Z9049 Acquired absence of other specified parts of digestive tract: Secondary | ICD-10-CM | POA: Diagnosis not present

## 2021-08-15 DIAGNOSIS — F5104 Psychophysiologic insomnia: Secondary | ICD-10-CM | POA: Diagnosis not present

## 2021-08-15 DIAGNOSIS — Z79890 Hormone replacement therapy (postmenopausal): Secondary | ICD-10-CM | POA: Diagnosis not present

## 2021-08-15 DIAGNOSIS — C189 Malignant neoplasm of colon, unspecified: Secondary | ICD-10-CM | POA: Diagnosis present

## 2021-08-15 DIAGNOSIS — Z79899 Other long term (current) drug therapy: Secondary | ICD-10-CM | POA: Diagnosis not present

## 2021-08-15 DIAGNOSIS — I10 Essential (primary) hypertension: Secondary | ICD-10-CM | POA: Diagnosis present

## 2021-08-15 DIAGNOSIS — R12 Heartburn: Secondary | ICD-10-CM | POA: Diagnosis not present

## 2021-08-15 DIAGNOSIS — J9601 Acute respiratory failure with hypoxia: Secondary | ICD-10-CM | POA: Diagnosis not present

## 2021-08-15 DIAGNOSIS — F419 Anxiety disorder, unspecified: Secondary | ICD-10-CM | POA: Diagnosis present

## 2021-08-15 DIAGNOSIS — G14 Postpolio syndrome: Secondary | ICD-10-CM | POA: Diagnosis not present

## 2021-08-15 DIAGNOSIS — F259 Schizoaffective disorder, unspecified: Secondary | ICD-10-CM | POA: Diagnosis not present

## 2021-08-15 DIAGNOSIS — C184 Malignant neoplasm of transverse colon: Principal | ICD-10-CM | POA: Diagnosis present

## 2021-08-15 DIAGNOSIS — I480 Paroxysmal atrial fibrillation: Secondary | ICD-10-CM | POA: Diagnosis not present

## 2021-08-15 DIAGNOSIS — F319 Bipolar disorder, unspecified: Secondary | ICD-10-CM | POA: Diagnosis present

## 2021-08-15 DIAGNOSIS — Z8249 Family history of ischemic heart disease and other diseases of the circulatory system: Secondary | ICD-10-CM

## 2021-08-15 DIAGNOSIS — M48061 Spinal stenosis, lumbar region without neurogenic claudication: Secondary | ICD-10-CM | POA: Diagnosis not present

## 2021-08-15 DIAGNOSIS — R739 Hyperglycemia, unspecified: Secondary | ICD-10-CM | POA: Diagnosis not present

## 2021-08-15 DIAGNOSIS — R0902 Hypoxemia: Secondary | ICD-10-CM

## 2021-08-15 LAB — TYPE AND SCREEN
ABO/RH(D): O POS
Antibody Screen: NEGATIVE

## 2021-08-15 SURGERY — COLECTOMY, PARTIAL, ROBOT-ASSISTED, LAPAROSCOPIC
Anesthesia: General | Site: Abdomen

## 2021-08-15 MED ORDER — ACETAMINOPHEN 500 MG PO TABS: 1000.0000 mg | ORAL_TABLET | Freq: Once | ORAL | Status: DC

## 2021-08-15 MED ORDER — PANTOPRAZOLE SODIUM 40 MG PO TBEC
40.0000 mg | DELAYED_RELEASE_TABLET | Freq: Every day | ORAL | Status: DC
  Administered 2021-08-16 – 2021-08-19 (×4): 40 mg via ORAL
  Filled 2021-08-15 (×4): qty 1

## 2021-08-15 MED ORDER — PHENYLEPHRINE HCL (PRESSORS) 10 MG/ML IV SOLN
INTRAVENOUS | Status: AC
  Filled 2021-08-15: qty 2

## 2021-08-15 MED ORDER — MELATONIN 3 MG PO TABS
3.0000 mg | ORAL_TABLET | Freq: Every day | ORAL | Status: DC
  Administered 2021-08-15 – 2021-08-22 (×8): 3 mg via ORAL
  Filled 2021-08-15 (×8): qty 1

## 2021-08-15 MED ORDER — ROCURONIUM BROMIDE 10 MG/ML (PF) SYRINGE
PREFILLED_SYRINGE | INTRAVENOUS | Status: DC | PRN
  Administered 2021-08-15: 50 mg via INTRAVENOUS

## 2021-08-15 MED ORDER — GABAPENTIN 300 MG PO CAPS
300.0000 mg | ORAL_CAPSULE | Freq: Two times a day (BID) | ORAL | Status: DC
  Administered 2021-08-15: 300 mg via ORAL
  Filled 2021-08-15: qty 1

## 2021-08-15 MED ORDER — ACETAMINOPHEN 500 MG PO TABS
1000.0000 mg | ORAL_TABLET | Freq: Four times a day (QID) | ORAL | Status: AC
  Administered 2021-08-15 – 2021-08-20 (×17): 1000 mg via ORAL
  Filled 2021-08-15 (×23): qty 2

## 2021-08-15 MED ORDER — OXYBUTYNIN CHLORIDE ER 5 MG PO TB24
15.0000 mg | ORAL_TABLET | Freq: Every day | ORAL | Status: DC
  Administered 2021-08-16 – 2021-08-23 (×8): 15 mg via ORAL
  Filled 2021-08-15 (×9): qty 3

## 2021-08-15 MED ORDER — ONDANSETRON HCL 4 MG/2ML IJ SOLN
4.0000 mg | Freq: Four times a day (QID) | INTRAMUSCULAR | Status: DC | PRN
  Administered 2021-08-19: 4 mg via INTRAVENOUS
  Filled 2021-08-15 (×2): qty 2

## 2021-08-15 MED ORDER — ONDANSETRON HCL 4 MG/2ML IJ SOLN
4.0000 mg | INTRAMUSCULAR | Status: AC
  Administered 2021-08-15: 4 mg via INTRAVENOUS

## 2021-08-15 MED ORDER — KETOTIFEN FUMARATE 0.025 % OP SOLN: 1.0000 [drp] | Freq: Two times a day (BID) | OPHTHALMIC | Status: DC

## 2021-08-15 MED ORDER — SODIUM CHLORIDE 0.9 % IV SOLN
2.0000 g | Freq: Two times a day (BID) | INTRAVENOUS | Status: AC
  Administered 2021-08-16: 2 g via INTRAVENOUS
  Filled 2021-08-15: qty 2

## 2021-08-15 MED ORDER — EPHEDRINE SULFATE-NACL 50-0.9 MG/10ML-% IV SOSY
PREFILLED_SYRINGE | INTRAVENOUS | Status: DC | PRN
  Administered 2021-08-15 (×2): 10 mg via INTRAVENOUS

## 2021-08-15 MED ORDER — DILTIAZEM HCL ER COATED BEADS 240 MG PO CP24
240.0000 mg | ORAL_CAPSULE | Freq: Every day | ORAL | Status: DC
  Administered 2021-08-16 – 2021-08-23 (×8): 240 mg via ORAL
  Filled 2021-08-15 (×8): qty 1

## 2021-08-15 MED ORDER — SODIUM CHLORIDE 0.9 % IV SOLN
2.0000 g | INTRAVENOUS | Status: AC
  Administered 2021-08-15: 2 g via INTRAVENOUS
  Filled 2021-08-15: qty 2

## 2021-08-15 MED ORDER — POLYETHYLENE GLYCOL 3350 17 G PO PACK
17.0000 g | PACK | Freq: Every day | ORAL | Status: DC
  Administered 2021-08-15: 17 g via ORAL
  Filled 2021-08-15 (×5): qty 1

## 2021-08-15 MED ORDER — BUPIVACAINE LIPOSOME 1.3 % IJ SUSP
INTRAMUSCULAR | Status: AC
  Filled 2021-08-15: qty 20

## 2021-08-15 MED ORDER — FLUTICASONE PROPIONATE 50 MCG/ACT NA SUSP
2.0000 | Freq: Every day | NASAL | Status: DC
  Administered 2021-08-16 – 2021-08-21 (×5): 2 via NASAL
  Filled 2021-08-15: qty 16

## 2021-08-15 MED ORDER — PROCHLORPERAZINE EDISYLATE 10 MG/2ML IJ SOLN
5.0000 mg | INTRAMUSCULAR | Status: DC | PRN
  Administered 2021-08-15: 5 mg via INTRAVENOUS
  Filled 2021-08-15: qty 2

## 2021-08-15 MED ORDER — ONDANSETRON HCL 4 MG/2ML IJ SOLN
INTRAMUSCULAR | Status: AC
  Filled 2021-08-15: qty 2

## 2021-08-15 MED ORDER — RINGERS IRRIGATION IR SOLN
Status: DC | PRN
  Administered 2021-08-15: 1

## 2021-08-15 MED ORDER — ACYCLOVIR 400 MG PO TABS
400.0000 mg | ORAL_TABLET | Freq: Every day | ORAL | Status: DC
  Administered 2021-08-15 – 2021-08-22 (×8): 400 mg via ORAL
  Filled 2021-08-15 (×8): qty 1

## 2021-08-15 MED ORDER — CHLORHEXIDINE GLUCONATE 0.12 % MT SOLN
15.0000 mL | Freq: Once | OROMUCOSAL | Status: AC
  Administered 2021-08-15: 15 mL via OROMUCOSAL

## 2021-08-15 MED ORDER — TEMAZEPAM 15 MG PO CAPS
15.0000 mg | ORAL_CAPSULE | Freq: Every day | ORAL | Status: DC
  Administered 2021-08-15 – 2021-08-22 (×8): 15 mg via ORAL
  Filled 2021-08-15 (×8): qty 1

## 2021-08-15 MED ORDER — 0.9 % SODIUM CHLORIDE (POUR BTL) OPTIME
TOPICAL | Status: DC | PRN
  Administered 2021-08-15: 2000 mL

## 2021-08-15 MED ORDER — SUGAMMADEX SODIUM 200 MG/2ML IV SOLN
INTRAVENOUS | Status: DC | PRN
  Administered 2021-08-15: 200 mg via INTRAVENOUS

## 2021-08-15 MED ORDER — FENTANYL CITRATE (PF) 100 MCG/2ML IJ SOLN
INTRAMUSCULAR | Status: DC | PRN
  Administered 2021-08-15: 100 ug via INTRAVENOUS

## 2021-08-15 MED ORDER — ALUM & MAG HYDROXIDE-SIMETH 200-200-20 MG/5ML PO SUSP
30.0000 mL | Freq: Four times a day (QID) | ORAL | Status: DC | PRN
  Administered 2021-08-16 – 2021-08-20 (×10): 30 mL via ORAL
  Filled 2021-08-15 (×11): qty 30

## 2021-08-15 MED ORDER — LAMOTRIGINE 100 MG PO TABS
100.0000 mg | ORAL_TABLET | Freq: Every day | ORAL | Status: DC
  Administered 2021-08-16 – 2021-08-23 (×8): 100 mg via ORAL
  Filled 2021-08-15 (×8): qty 1

## 2021-08-15 MED ORDER — ENSURE PRE-SURGERY PO LIQD
296.0000 mL | Freq: Once | ORAL | Status: DC
  Filled 2021-08-15: qty 296

## 2021-08-15 MED ORDER — BUPIVACAINE-EPINEPHRINE 0.25% -1:200000 IJ SOLN
INTRAMUSCULAR | Status: DC | PRN
  Administered 2021-08-15: 30 mL

## 2021-08-15 MED ORDER — KCL IN DEXTROSE-NACL 20-5-0.45 MEQ/L-%-% IV SOLN
INTRAVENOUS | Status: DC
  Filled 2021-08-15: qty 1000

## 2021-08-15 MED ORDER — ENSURE PRE-SURGERY PO LIQD
592.0000 mL | Freq: Once | ORAL | Status: DC
  Filled 2021-08-15: qty 592

## 2021-08-15 MED ORDER — PROPOFOL 10 MG/ML IV BOLUS
INTRAVENOUS | Status: AC
  Filled 2021-08-15: qty 20

## 2021-08-15 MED ORDER — HYDROMORPHONE HCL 1 MG/ML IJ SOLN: 0.5000 mg | INTRAMUSCULAR | Status: DC | PRN

## 2021-08-15 MED ORDER — LEVOTHYROXINE SODIUM 100 MCG PO TABS
100.0000 ug | ORAL_TABLET | Freq: Every day | ORAL | Status: DC
  Administered 2021-08-16 – 2021-08-23 (×8): 100 ug via ORAL
  Filled 2021-08-15 (×8): qty 1

## 2021-08-15 MED ORDER — ALVIMOPAN 12 MG PO CAPS: 12.0000 mg | ORAL_CAPSULE | Freq: Two times a day (BID) | ORAL | Status: DC

## 2021-08-15 MED ORDER — ROCURONIUM BROMIDE 10 MG/ML (PF) SYRINGE
PREFILLED_SYRINGE | INTRAVENOUS | Status: AC
  Filled 2021-08-15: qty 10

## 2021-08-15 MED ORDER — DIPHENHYDRAMINE HCL 12.5 MG/5ML PO ELIX: 12.5000 mg | ORAL_SOLUTION | Freq: Four times a day (QID) | ORAL | Status: DC | PRN

## 2021-08-15 MED ORDER — DEXAMETHASONE SODIUM PHOSPHATE 10 MG/ML IJ SOLN
INTRAMUSCULAR | Status: DC | PRN
  Administered 2021-08-15: 10 mg via INTRAVENOUS

## 2021-08-15 MED ORDER — TRAMADOL HCL 50 MG PO TABS
50.0000 mg | ORAL_TABLET | Freq: Four times a day (QID) | ORAL | Status: DC | PRN
  Administered 2021-08-16: 50 mg via ORAL
  Administered 2021-08-22: 100 mg via ORAL
  Filled 2021-08-15: qty 2
  Filled 2021-08-15: qty 1

## 2021-08-15 MED ORDER — DONEPEZIL HCL 10 MG PO TABS
10.0000 mg | ORAL_TABLET | Freq: Every day | ORAL | Status: DC
  Administered 2021-08-15 – 2021-08-22 (×8): 10 mg via ORAL
  Filled 2021-08-15 (×8): qty 1

## 2021-08-15 MED ORDER — AMISULPRIDE (ANTIEMETIC) 5 MG/2ML IV SOLN: 10.0000 mg | Freq: Once | INTRAVENOUS | Status: DC | PRN

## 2021-08-15 MED ORDER — FENTANYL CITRATE PF 50 MCG/ML IJ SOSY
25.0000 ug | PREFILLED_SYRINGE | INTRAMUSCULAR | Status: DC | PRN
  Administered 2021-08-15 (×3): 50 ug via INTRAVENOUS

## 2021-08-15 MED ORDER — PROMETHAZINE HCL 25 MG/ML IJ SOLN: 6.2500 mg | INTRAMUSCULAR | Status: DC | PRN

## 2021-08-15 MED ORDER — LIDOCAINE HCL 2 % IJ SOLN
INTRAMUSCULAR | Status: AC
  Filled 2021-08-15: qty 20

## 2021-08-15 MED ORDER — FENTANYL CITRATE PF 50 MCG/ML IJ SOSY
PREFILLED_SYRINGE | INTRAMUSCULAR | Status: AC
  Filled 2021-08-15: qty 3

## 2021-08-15 MED ORDER — BUPIVACAINE-EPINEPHRINE (PF) 0.25% -1:200000 IJ SOLN
INTRAMUSCULAR | Status: AC
  Filled 2021-08-15: qty 30

## 2021-08-15 MED ORDER — DIPHENHYDRAMINE HCL 50 MG/ML IJ SOLN: 12.5000 mg | Freq: Four times a day (QID) | INTRAMUSCULAR | Status: DC | PRN

## 2021-08-15 MED ORDER — ILOPERIDONE 2 MG PO TABS
4.0000 mg | ORAL_TABLET | Freq: Every day | ORAL | Status: DC
  Administered 2021-08-15: 4 mg via ORAL
  Filled 2021-08-15 (×2): qty 2

## 2021-08-15 MED ORDER — ZIPRASIDONE HCL 40 MG PO CAPS
40.0000 mg | ORAL_CAPSULE | Freq: Every day | ORAL | Status: DC
  Administered 2021-08-15 – 2021-08-22 (×8): 40 mg via ORAL
  Filled 2021-08-15 (×8): qty 1

## 2021-08-15 MED ORDER — LORATADINE 10 MG PO TABS
10.0000 mg | ORAL_TABLET | Freq: Every day | ORAL | Status: DC
  Administered 2021-08-16 – 2021-08-19 (×4): 10 mg via ORAL
  Filled 2021-08-15 (×4): qty 1

## 2021-08-15 MED ORDER — ALVIMOPAN 12 MG PO CAPS
12.0000 mg | ORAL_CAPSULE | ORAL | Status: AC
  Administered 2021-08-15: 12 mg via ORAL
  Filled 2021-08-15: qty 1

## 2021-08-15 MED ORDER — LIDOCAINE 2% (20 MG/ML) 5 ML SYRINGE
INTRAMUSCULAR | Status: DC | PRN
  Administered 2021-08-15: 50 mg via INTRAVENOUS

## 2021-08-15 MED ORDER — ENOXAPARIN SODIUM 40 MG/0.4ML IJ SOSY
40.0000 mg | PREFILLED_SYRINGE | INTRAMUSCULAR | Status: DC
  Administered 2021-08-16: 40 mg via SUBCUTANEOUS
  Filled 2021-08-15: qty 0.4

## 2021-08-15 MED ORDER — PROPOFOL 10 MG/ML IV BOLUS
INTRAVENOUS | Status: DC | PRN
  Administered 2021-08-15: 100 mg via INTRAVENOUS

## 2021-08-15 MED ORDER — ORAL CARE MOUTH RINSE: 15.0000 mL | Freq: Once | OROMUCOSAL | Status: AC

## 2021-08-15 MED ORDER — LACTATED RINGERS IV SOLN: INTRAVENOUS | Status: DC

## 2021-08-15 MED ORDER — BUPIVACAINE LIPOSOME 1.3 % IJ SUSP: 20.0000 mL | Freq: Once | INTRAMUSCULAR | Status: DC

## 2021-08-15 MED ORDER — ACETAMINOPHEN 500 MG PO TABS
1000.0000 mg | ORAL_TABLET | ORAL | Status: AC
  Administered 2021-08-15: 1000 mg via ORAL
  Filled 2021-08-15: qty 2

## 2021-08-15 MED ORDER — DEXAMETHASONE SODIUM PHOSPHATE 10 MG/ML IJ SOLN
INTRAMUSCULAR | Status: AC
  Filled 2021-08-15: qty 1

## 2021-08-15 MED ORDER — ONDANSETRON HCL 4 MG PO TABS: 4.0000 mg | ORAL_TABLET | Freq: Four times a day (QID) | ORAL | Status: DC | PRN

## 2021-08-15 MED ORDER — BUPIVACAINE LIPOSOME 1.3 % IJ SUSP
INTRAMUSCULAR | Status: DC | PRN
  Administered 2021-08-15: 20 mL

## 2021-08-15 MED ORDER — CHLORTHALIDONE 25 MG PO TABS
25.0000 mg | ORAL_TABLET | Freq: Every day | ORAL | Status: DC
  Administered 2021-08-16: 25 mg via ORAL
  Filled 2021-08-15: qty 1

## 2021-08-15 MED ORDER — ONDANSETRON HCL 4 MG/2ML IJ SOLN
INTRAMUSCULAR | Status: DC | PRN
  Administered 2021-08-15: 4 mg via INTRAVENOUS

## 2021-08-15 MED ORDER — APREPITANT 40 MG PO CAPS
40.0000 mg | ORAL_CAPSULE | Freq: Once | ORAL | Status: AC
  Administered 2021-08-15: 40 mg via ORAL
  Filled 2021-08-15: qty 1

## 2021-08-15 MED ORDER — GABAPENTIN 300 MG PO CAPS
300.0000 mg | ORAL_CAPSULE | ORAL | Status: AC
  Administered 2021-08-15: 300 mg via ORAL
  Filled 2021-08-15: qty 1

## 2021-08-15 MED ORDER — ENSURE SURGERY PO LIQD
237.0000 mL | Freq: Two times a day (BID) | ORAL | Status: DC
  Administered 2021-08-16 – 2021-08-18 (×4): 237 mL via ORAL

## 2021-08-15 MED ORDER — SACCHAROMYCES BOULARDII 250 MG PO CAPS
250.0000 mg | ORAL_CAPSULE | Freq: Two times a day (BID) | ORAL | Status: DC
  Administered 2021-08-15 – 2021-08-23 (×16): 250 mg via ORAL
  Filled 2021-08-15 (×16): qty 1

## 2021-08-15 MED ORDER — PHENYLEPHRINE 40 MCG/ML (10ML) SYRINGE FOR IV PUSH (FOR BLOOD PRESSURE SUPPORT)
PREFILLED_SYRINGE | INTRAVENOUS | Status: DC | PRN
  Administered 2021-08-15: 40 ug via INTRAVENOUS
  Administered 2021-08-15: 60 ug via INTRAVENOUS
  Administered 2021-08-15 (×2): 40 ug via INTRAVENOUS
  Administered 2021-08-15: 60 ug via INTRAVENOUS

## 2021-08-15 MED ORDER — FENTANYL CITRATE (PF) 100 MCG/2ML IJ SOLN
INTRAMUSCULAR | Status: AC
  Filled 2021-08-15: qty 2

## 2021-08-15 SURGICAL SUPPLY — 98 items
ADH SKN CLS APL DERMABOND .7 (GAUZE/BANDAGES/DRESSINGS) ×1
BAG COUNTER SPONGE SURGICOUNT (BAG) ×2 IMPLANT
BAG SPNG CNTER NS LX DISP (BAG) ×1
BLADE EXTENDED COATED 6.5IN (ELECTRODE) IMPLANT
CANNULA REDUC XI 12-8 STAPL (CANNULA)
CANNULA REDUCER 12-8 DVNC XI (CANNULA) IMPLANT
CELLS DAT CNTRL 66122 CELL SVR (MISCELLANEOUS) IMPLANT
COVER SURGICAL LIGHT HANDLE (MISCELLANEOUS) ×4 IMPLANT
COVER TIP SHEARS 8 DVNC (MISCELLANEOUS) ×1 IMPLANT
COVER TIP SHEARS 8MM DA VINCI (MISCELLANEOUS) ×2
DECANTER SPIKE VIAL GLASS SM (MISCELLANEOUS) IMPLANT
DERMABOND ADVANCED (GAUZE/BANDAGES/DRESSINGS) ×1
DERMABOND ADVANCED .7 DNX12 (GAUZE/BANDAGES/DRESSINGS) IMPLANT
DRAIN CHANNEL 19F RND (DRAIN) IMPLANT
DRAPE ARM DVNC X/XI (DISPOSABLE) ×4 IMPLANT
DRAPE COLUMN DVNC XI (DISPOSABLE) ×1 IMPLANT
DRAPE DA VINCI XI ARM (DISPOSABLE) ×8
DRAPE DA VINCI XI COLUMN (DISPOSABLE) ×2
DRAPE SURG IRRIG POUCH 19X23 (DRAPES) ×2 IMPLANT
DRSG OPSITE POSTOP 4X10 (GAUZE/BANDAGES/DRESSINGS) IMPLANT
DRSG OPSITE POSTOP 4X6 (GAUZE/BANDAGES/DRESSINGS) ×1 IMPLANT
DRSG OPSITE POSTOP 4X8 (GAUZE/BANDAGES/DRESSINGS) IMPLANT
ELECT PENCIL ROCKER SW 15FT (MISCELLANEOUS) ×2 IMPLANT
ELECT REM PT RETURN 15FT ADLT (MISCELLANEOUS) ×2 IMPLANT
ENDOLOOP SUT PDS II  0 18 (SUTURE)
ENDOLOOP SUT PDS II 0 18 (SUTURE) IMPLANT
EVACUATOR SILICONE 100CC (DRAIN) IMPLANT
GLOVE SURG ENC MOIS LTX SZ6.5 (GLOVE) ×6 IMPLANT
GLOVE SURG UNDER POLY LF SZ7 (GLOVE) ×4 IMPLANT
GOWN STRL REUS W/TWL XL LVL3 (GOWN DISPOSABLE) ×6 IMPLANT
GRASPER SUT TROCAR 14GX15 (MISCELLANEOUS) IMPLANT
HOLDER FOLEY CATH W/STRAP (MISCELLANEOUS) ×2 IMPLANT
IRRIG SUCT STRYKERFLOW 2 WTIP (MISCELLANEOUS) ×2
IRRIGATION SUCT STRKRFLW 2 WTP (MISCELLANEOUS) ×1 IMPLANT
KIT PROCEDURE DA VINCI SI (MISCELLANEOUS)
KIT PROCEDURE DVNC SI (MISCELLANEOUS) IMPLANT
KIT TURNOVER KIT A (KITS) ×2 IMPLANT
NDL INSUFFLATION 14GA 120MM (NEEDLE) ×1 IMPLANT
NEEDLE INSUFFLATION 14GA 120MM (NEEDLE) ×2 IMPLANT
PACK CARDIOVASCULAR III (CUSTOM PROCEDURE TRAY) ×2 IMPLANT
PACK COLON (CUSTOM PROCEDURE TRAY) ×2 IMPLANT
PAD POSITIONING PINK XL (MISCELLANEOUS) ×2 IMPLANT
RELOAD STAPLE 60 2.5 WHT DVNC (STAPLE) IMPLANT
RELOAD STAPLE 60 3.5 BLU DVNC (STAPLE) IMPLANT
RELOAD STAPLE 60 4.3 GRN DVNC (STAPLE) IMPLANT
RELOAD STAPLER 2.5X60 WHT DVNC (STAPLE) ×2 IMPLANT
RELOAD STAPLER 3.5X60 BLU DVNC (STAPLE) ×1 IMPLANT
RELOAD STAPLER 4.3X60 GRN DVNC (STAPLE) ×1 IMPLANT
RETRACTOR WND ALEXIS 18 MED (MISCELLANEOUS) IMPLANT
RTRCTR WOUND ALEXIS 18CM MED (MISCELLANEOUS)
SCISSORS LAP 5X35 DISP (ENDOMECHANICALS) IMPLANT
SEAL CANN UNIV 5-8 DVNC XI (MISCELLANEOUS) ×3 IMPLANT
SEAL XI 5MM-8MM UNIVERSAL (MISCELLANEOUS) ×6
SEALER VESSEL DA VINCI XI (MISCELLANEOUS) ×2
SEALER VESSEL EXT DVNC XI (MISCELLANEOUS) ×1 IMPLANT
SOLUTION ELECTROLUBE (MISCELLANEOUS) ×2 IMPLANT
STAPLER 60 DA VINCI SURE FORM (STAPLE) ×2
STAPLER 60 SUREFORM DVNC (STAPLE) IMPLANT
STAPLER CANNULA SEAL DVNC XI (STAPLE) IMPLANT
STAPLER CANNULA SEAL XI (STAPLE)
STAPLER ECHELON POWER CIR 29 (STAPLE) IMPLANT
STAPLER ECHELON POWER CIR 31 (STAPLE) IMPLANT
STAPLER RELOAD 2.5X60 WHITE (STAPLE) ×4
STAPLER RELOAD 2.5X60 WHT DVNC (STAPLE) ×2
STAPLER RELOAD 3.5X60 BLU DVNC (STAPLE) ×1
STAPLER RELOAD 3.5X60 BLUE (STAPLE) ×2
STAPLER RELOAD 4.3X60 GREEN (STAPLE) ×2
STAPLER RELOAD 4.3X60 GRN DVNC (STAPLE) ×1
STOPCOCK 4 WAY LG BORE MALE ST (IV SETS) ×4 IMPLANT
SUT ETHILON 2 0 PS N (SUTURE) IMPLANT
SUT NOVA NAB DX-16 0-1 5-0 T12 (SUTURE) ×4 IMPLANT
SUT NOVA NAB GS-21 1 T12 (SUTURE) ×2 IMPLANT
SUT PROLENE 2 0 KS (SUTURE) IMPLANT
SUT SILK 2 0 (SUTURE) ×2
SUT SILK 2 0 SH CR/8 (SUTURE) IMPLANT
SUT SILK 2-0 18XBRD TIE 12 (SUTURE) ×1 IMPLANT
SUT SILK 3 0 (SUTURE)
SUT SILK 3 0 SH CR/8 (SUTURE) ×2 IMPLANT
SUT SILK 3-0 18XBRD TIE 12 (SUTURE) IMPLANT
SUT V-LOC BARB 180 2/0GR6 GS22 (SUTURE)
SUT VIC AB 2-0 SH 18 (SUTURE) IMPLANT
SUT VIC AB 2-0 SH 27 (SUTURE)
SUT VIC AB 2-0 SH 27X BRD (SUTURE) IMPLANT
SUT VIC AB 3-0 SH 18 (SUTURE) IMPLANT
SUT VIC AB 4-0 PS2 27 (SUTURE) ×5 IMPLANT
SUT VICRYL 0 UR6 27IN ABS (SUTURE) ×2 IMPLANT
SUTURE V-LC BRB 180 2/0GR6GS22 (SUTURE) IMPLANT
SYR 10ML ECCENTRIC (SYRINGE) ×2 IMPLANT
SYS LAPSCP GELPORT 120MM (MISCELLANEOUS)
SYS WOUND ALEXIS 18CM MED (MISCELLANEOUS)
SYSTEM LAPSCP GELPORT 120MM (MISCELLANEOUS) IMPLANT
SYSTEM WOUND ALEXIS 18CM MED (MISCELLANEOUS) IMPLANT
TOWEL OR 17X26 10 PK STRL BLUE (TOWEL DISPOSABLE) IMPLANT
TOWEL OR NON WOVEN STRL DISP B (DISPOSABLE) ×2 IMPLANT
TRAY FOLEY MTR SLVR 16FR STAT (SET/KITS/TRAYS/PACK) ×2 IMPLANT
TROCAR ADV FIXATION 5X100MM (TROCAR) ×2 IMPLANT
TUBING CONNECTING 10 (TUBING) ×3 IMPLANT
TUBING INSUFFLATION 10FT LAP (TUBING) ×2 IMPLANT

## 2021-08-15 NOTE — H&P (Signed)
History of Present Illness: Kaitlyn Hoover is a 80 y.o. female who is seen today as an office consultation at the request of Dr. Cristina Gong for evaluation of No chief complaint on file. 80 year old former smoker who presents to the office today for evaluation of a newly diagnosed colon cancer.  She has a history of multiple colon polyps but recently developed some rectal bleeding which prompted her to get a colonoscopy.  This showed a semicircumferential minimally obstructing mass in the transverse colon.  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  Tumor was tattooed and a clip was placed near the area.  She also had polyp noted at her previous polypectomy sites which was not amenable to endoscopic removal due to indistinct margins.  CT scans of the chest abdomen and pelvis show no signs of metastatic disease and a possible mass and clip noted at the hepatic flexure.  There are some tiny lung nodules noted that are indeterminate.  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  She is currently anticoagulated with Eliquis.  Surgical history is significant for C-section and hysterectomy.     Review of Systems: A complete review of systems was obtained from the patient.  I have reviewed this information and discussed as appropriate with the patient.  See HPI as well for other ROS.     Medical History: Past Medical History         Past Medical History:  Diagnosis Date   Arrhythmia     Arthritis     GERD (gastroesophageal reflux disease)     History of cancer     Hypertension     Polio     Thyroid disease               Patient Active Problem List  Diagnosis   Postpolio syndrome      Past Surgical History           Past Surgical History:  Procedure Laterality Date   CESAREAN SECTION       HYSTERECTOMY VAGINAL       SPINE SURGERY       THYROIDECTOMY FOR REMOVAL REMAINING TISSUE            Allergies         Allergies  Allergen  Reactions   Ciprofloxacin Unknown   Sulfabenzamide Unknown                   Current Outpatient Medications on File Prior to Visit  Medication Sig Dispense Refill   diltiazem (TIAZAC) 240 MG ER capsule Take 1 capsule by mouth once daily       acyclovir (ZOVIRAX) 400 MG tablet Take 400 mg by mouth once daily       alendronate (FOSAMAX) 70 MG tablet Take 70 mg by mouth once a week       ascorbic acid, vitamin C, (VITAMIN C) 1000 MG tablet Take by mouth       atenolol (TENORMIN) 25 MG tablet Take 25 mg by mouth 2 (two) times daily.       azelastine (OPTIVAR) 0.05 % ophthalmic solution INSTILL 1 DROP INTO AFFECTED EYE TWICE A DAY FOR 30 DAYS       calcium citrate-vitamin D3 (CITRACAL) 250 mg-5 mcg (200 unit) tablet Take 2 tablets by mouth at bedtime       chlorthalidone 25 MG tablet Take 25 mg by mouth once daily       co-enzyme Q-10, ubiquinone, 100 mg  capsule Take 100 mg by mouth once daily       cyclobenzaprine (FLEXERIL) 10 MG tablet TAKE 1 TABLET BY MOUTH EVERY DAY AT BEDTIME AS NEEDED FOR 30 DAYS       docosahexaenoic acid-epa 120-180 mg Cap Take by mouth       donepeziL (ARICEPT) 10 MG tablet Take 10 mg by mouth once daily       ELIQUIS 5 mg tablet Take 5 mg by mouth 2 (two) times daily       FANAPT 4 mg tablet Take 4 mg by mouth at bedtime       fexofenadine (ALLEGRA ALLERGY) 180 MG tablet Take 180 mg by mouth daily.       fluticasone propionate (FLONASE) 50 mcg/actuation nasal spray Place into one nostril       ginkgo biloba 120 mg Tab Take by mouth       lamoTRIgine (LAMICTAL) 100 MG tablet Take 100 mg by mouth once daily       levothyroxine (SYNTHROID) 100 MCG tablet TAKE 1 TABLET EVERY MORNING ON AN EMPTY STOMACH       levothyroxine (SYNTHROID, LEVOTHROID) 88 MCG tablet Take 88 mcg by mouth daily. Take on an empty stomach with a glass of water at least 30-60 minutes before breakfast. (Patient not taking: Reported on 07/03/2021)       magnesium 200 mg Take by mouth       melatonin  3 mg tablet Take by mouth       multivitamin capsule Take by mouth       omeprazole (PRILOSEC) 20 MG DR capsule Take 20 mg by mouth daily.       oxybutynin (DITROPAN-XL) 10 MG XL tablet Take by mouth       ribose, bulk, 100 % Powd Take by mouth       rosuvastatin (CRESTOR) 10 MG tablet TAKE 1 TABLET BY MOUTH UP TO 4 TIMES A WEEK ON SUNDAY, MONDAY,WEDNESDAY AND FRIDAY       sodium chloride (AYR) 0.65 % nasal drops Place into one nostril       temazepam (RESTORIL) 15 mg capsule Take 15 mg by mouth at bedtime       tolterodine (DETROL LA) 4 MG LA capsule Take 4 mg by mouth daily.       traMADoL (ULTRAM) 50 mg tablet 1 TABLET AS NEEDED ORALLY 3 TIMES DAILY AS NEEDED FOR PAIN 30 DAYS       valACYclovir (VALTREX) 500 MG tablet Take 500 mg by mouth 2 (two) times daily.       ziprasidone (GEODON) 40 MG capsule Take 40 mg by mouth once daily       ziprasidone (GEODON) 60 MG capsule Take 60 mg by mouth 2 (two) times daily with meals.        No current facility-administered medications on file prior to visit.      Family History           Family History  Problem Relation Age of Onset   High blood pressure (Hypertension) Mother     Cancer Father     Cancer Brother          Social History         Tobacco Use  Smoking Status Former Smoker  Smokeless Tobacco Never Used      Social History  Social History           Socioeconomic History   Marital status: Divorced  Tobacco Use  Smoking status: Former Smoker   Smokeless tobacco: Never Used  Scientific laboratory technician Use: Never used  Substance and Sexual Activity   Alcohol use: Not Currently   Drug use: Never        Objective:      BP (!) 161/93   Pulse 67   Temp 98.6 F (37 C) (Oral)   Resp 16   LMP  (LMP Unknown)   SpO2 98%     Exam Gen: NAD CV: RRR Lungs: CTA Abd: soft, lower midline scar     Labs, Imaging and Diagnostic Testing:   CT Chest, Abd and Pelvis 06/26/21   IMPRESSION: 1. There is eccentric wall  thickening of the hepatic flexure of the colon, which may present primary colon malignancy identified by colonoscopy. 2. No definite evidence of metastatic disease in the chest, abdomen, or pelvis. 3. There are numerous tiny pulmonary nodules throughout the lungs, most concentrated in the lung apices, measuring no larger than 2 mm, for example in the superior segment left lower lobe. These are nonspecific and likely infectious or inflammatory, particularly smoking-related respiratory bronchiolitis, although metastatic disease is not strictly excluded. Attention on follow-up. 4. Descending and sigmoid diverticulosis without evidence of acute diverticulitis. 5. Status post hysterectomy.   Assessment and Plan:  Diagnoses and all orders for this visit:   Malignant neoplasm of transverse colon (CMS-HCC)     CT scan shows clip and possible mass near the hepatic flexure.  Other unresected polyps appear to be proximal to this.  We will plan on proceeding with a robotic assisted right colectomy.  We have discussed this in detail.  All questions were answered.  We will obtain cardiac clearance and proceed with scheduling surgery. The surgery and anatomy were described to the patient as well as the risks of surgery and the possible complications.  These include: Bleeding, deep abdominal infections and possible wound complications such as hernia and infection, damage to adjacent structures, leak of surgical connections, which can lead to other surgeries and possibly an ostomy, possible need for other procedures, such as abscess drains in radiology, possible prolonged hospital stay, possible diarrhea from removal of part of the colon, possible constipation from narcotics, possible bowel, bladder or sexual dysfunction if having rectal surgery, prolonged fatigue/weakness or appetite loss, possible early recurrence of of disease, possible complications of their medical problems such as heart disease or  arrhythmias or lung problems, death (less than 1%). I believe the patient understands and wishes to proceed with the surgery.

## 2021-08-15 NOTE — Anesthesia Postprocedure Evaluation (Signed)
Anesthesia Post Note  Patient: Kaitlyn Hoover  Procedure(s) Performed: XI ROBOT ASSISTED LAPAROSCOPIC RIGHT COLECTOMY (Abdomen)     Patient location during evaluation: PACU Anesthesia Type: General Level of consciousness: sedated Pain management: pain level controlled Vital Signs Assessment: post-procedure vital signs reviewed and stable Respiratory status: spontaneous breathing and respiratory function stable Cardiovascular status: stable Postop Assessment: no apparent nausea or vomiting Anesthetic complications: no   No notable events documented.  Last Vitals:  Vitals:   08/15/21 1620 08/15/21 1630  BP:  (!) 148/51  Pulse: 76 77  Resp: 15 19  Temp: (!) 36.1 C   SpO2: 99% 100%    Last Pain:  Vitals:   08/15/21 1605  TempSrc:   PainSc: 6                  Ell Tiso DANIEL

## 2021-08-15 NOTE — Anesthesia Procedure Notes (Signed)
Procedure Name: Intubation Date/Time: 08/15/2021 12:59 PM Performed by: Gerald Leitz, CRNA Pre-anesthesia Checklist: Patient identified, Patient being monitored, Timeout performed, Emergency Drugs available and Suction available Patient Re-evaluated:Patient Re-evaluated prior to induction Oxygen Delivery Method: Circle system utilized Preoxygenation: Pre-oxygenation with 100% oxygen Induction Type: IV induction Ventilation: Mask ventilation without difficulty Laryngoscope Size: Mac and 3 Grade View: Grade I Tube type: Oral Tube size: 7.0 mm Number of attempts: 1 Placement Confirmation: ETT inserted through vocal cords under direct vision, positive ETCO2 and breath sounds checked- equal and bilateral Secured at: 21 cm Tube secured with: Tape Dental Injury: Teeth and Oropharynx as per pre-operative assessment

## 2021-08-15 NOTE — Op Note (Signed)
08/15/2021  3:26 PM  PATIENT:  Kaitlyn Hoover  80 y.o. female  Patient Care Team: [name], MD as PCP - General (Family Medicine) Constance Haw, MD as PCP - Electrophysiology (Cardiology)  PRE-OPERATIVE DIAGNOSIS:  colon cancer  POST-OPERATIVE DIAGNOSIS:  colon cancer  PROCEDURE:  XI ROBOT ASSISTED RIGHT COLECTOMY   Surgeon(s): Leighton Ruff, MD Michael Boston, MD  ASSISTANT: Dr Johney Maine   ANESTHESIA:   local and general  EBL:  Total I/O In: 1100 [I.V.:1000; IV Piggyback:100] Out: 220 [Urine:200; Blood:20]  SPECIMEN:  Source of Specimen:  R colon  DISPOSITION OF SPECIMEN:  PATHOLOGY  COUNTS:  YES  PLAN OF CARE: Admit to inpatient   PATIENT DISPOSITION:  PACU - hemodynamically stable.   INDICATIONS: This is a 80 y.o. female who presented to my office with a hepatic flexure colon cancer. The risk and benefits and alternative treatments were explained to the patient prior to the OR and the patient has elected to proceed with robotic assisted R colectomy.  Consent was signed and placed on chart prior to the OR.   OR FINDINGS: mass and tattoo in hepatic flexure   DESCRIPTION:   Informed consent was confirmed.  The patient underwent general anaesthesia without difficulty.  The patient was positioned appropriately.  VTE prevention in place.  The patient's abdomen was clipped, prepped, & draped in a sterile fashion.  Surgical timeout confirmed our plan.  The patient was positioned in reverse Trendelenburg.  Abdominal entry was gained using a Varies needle in the LUQ.  Entry was clean.  I induced carbon dioxide insufflation.  An 42mm robotic port was placed in the LUQ.  Camera inspection revealed no injury.  Extra ports were carefully placed under direct laparoscopic visualization.  I laparoscopically reflected the greater omentum and the upper abdomen the small bowel in the peilvis. The patient was appropriately positioned and the robot was docked to the patient's right  side.  Instruments were placed under direct visualization.    I began by identifying the ileocolic artery and vein within the mesentery. Dissection was bluntly carried around these structures. The duodenum was identified and free from the structures. I then separated the structures bluntly and used the robotic vessel sealer device to transect these.  I developed the retroperitoneal plane bluntly.  I then freed the appendix off its attachments to the pelvic wall. I mobilized the terminal ileum.  I took care to avoid injuring any retroperitoneal structures.  After this I began to mobilize laterally down the white line of [place] and then took down the hepatic flexure using the Enseal device. I mobilized the omentum off of the right transverse colon. The entire colon was then flipped medially and mobilized off of the retroperitoneal structures until I could visualize the lateral edge of the duodenum underneath.  I gently freed the duodenal attachments.   I identified a portion of mesentery of the transverse colon just proximal to the right branch of the middle colic.  I divided up to the colon from the previous dissection of the mesentery using the robotic vessel sealer.  Vascular perfusion was assessed using intraoperative intravenous firefly injection.  There was good perfusion to the level of dissection of the transverse colon and the terminal ileum.  I then divided the terminal ileal mesentery in similar fashion.  At that point, the terminal ileum was divided with a blue load robotic 60 mm stapler.  The transverse colon was divided with a green load robotic stapler.  The specimen  was then completely free and placed in the right upper quadrant.  Hemostasis was good.  I then oriented the remaining terminal ileum and transverse colon and a isoperistaltic fashion.  I placed an enterotomy in the small bowel and colon using the robotic scissors.  I then introduced a white load 60 mm robotic stapler x2 into both  enterotomies and created an anastomosis between the small bowel and transverse colon.  Hemostasis within the staple line was good.  The common enterotomy channel was closed using 2 running 2-0 V-Loc sutures.  The abdomen was then irrigated with normal saline. The omentum was then brought down over the anastomosis.  At this point the robot was undocked.  The 12 mm suprapubic port was enlarged to a midline incision and an [place] wound protector was placed.  The specimen was removed from the abdomen and evaluated.  Once the abdomen was inspected for hemostasis, the [place] wound protector was removed.    The incision was closed using 2 running #1 Novafil sutures.  The subcutaneous tissue of the extraction incision was closed using a running 2-0 Vicryl suture. The skin was then closed using running subcuticular 4-0 Vicryl sutures.  A sterile dressing was applied.  The remaining port sites were closed using interrupted 4-0 Vicryl sutures and Dermabond.  All counts were correct per operating room staff. The patient was then awakened from anesthesia and sent to the post anesthesia care unit in stable condition.

## 2021-08-15 NOTE — Transfer of Care (Signed)
Immediate Anesthesia Transfer of Care Note  Patient: Kaitlyn Hoover  Procedure(s) Performed: Procedure(s): XI ROBOT ASSISTED LAPAROSCOPIC RIGHT COLECTOMY (N/A)  Patient Location: PACU  Anesthesia Type:General  Level of Consciousness: Alert, Awake, Oriented  Airway & Oxygen Therapy: Patient Spontanous Breathing  Post-op Assessment: Report given to RN  Post vital signs: Reviewed and stable  Last Vitals:  Vitals:   08/15/21 1100  BP: (!) 161/93  Pulse: 67  Resp: 16  Temp: 37 C  SpO2: 50%    Complications: No apparent anesthesia complications

## 2021-08-16 DIAGNOSIS — C184 Malignant neoplasm of transverse colon: Principal | ICD-10-CM

## 2021-08-16 LAB — RENAL FUNCTION PANEL
Albumin: 2.8 g/dL — ABNORMAL LOW (ref 3.5–5.0)
Anion gap: 6 (ref 5–15)
BUN: 11 mg/dL (ref 8–23)
CO2: 28 mmol/L (ref 22–32)
Calcium: 8.1 mg/dL — ABNORMAL LOW (ref 8.9–10.3)
Chloride: 100 mmol/L (ref 98–111)
Creatinine, Ser: 0.79 mg/dL (ref 0.44–1.00)
GFR, Estimated: >60 mL/min (ref >60)
Glucose, Bld: 113 mg/dL — ABNORMAL HIGH (ref 70–99)
Phosphorus: 1.7 mg/dL — ABNORMAL LOW (ref 2.5–4.6)
Potassium: 3.1 mmol/L — ABNORMAL LOW (ref 3.5–5.1)
Sodium: 134 mmol/L — ABNORMAL LOW (ref 135–145)

## 2021-08-16 LAB — BASIC METABOLIC PANEL
Anion gap: 10 (ref 5–15)
BUN: 11 mg/dL (ref 8–23)
CO2: 24 mmol/L (ref 22–32)
Calcium: 8.1 mg/dL — ABNORMAL LOW (ref 8.9–10.3)
Chloride: 93 mmol/L — ABNORMAL LOW (ref 98–111)
Creatinine, Ser: 0.76 mg/dL (ref 0.44–1.00)
GFR, Estimated: >60 mL/min (ref >60)
Glucose, Bld: 177 mg/dL — ABNORMAL HIGH (ref 70–99)
Potassium: 3 mmol/L — ABNORMAL LOW (ref 3.5–5.1)
Sodium: 127 mmol/L — ABNORMAL LOW (ref 135–145)

## 2021-08-16 LAB — MAGNESIUM: Magnesium: 1.4 mg/dL — ABNORMAL LOW (ref 1.7–2.4)

## 2021-08-16 LAB — CBC
HCT: 28.4 % — ABNORMAL LOW (ref 36.0–46.0)
Hemoglobin: 9.8 g/dL — ABNORMAL LOW (ref 12.0–15.0)
MCH: 31 pg (ref 26.0–34.0)
MCHC: 34.5 g/dL (ref 30.0–36.0)
MCV: 89.9 fL (ref 80.0–100.0)
Platelets: 210 10*3/uL (ref 150–400)
RBC: 3.16 MIL/uL — ABNORMAL LOW (ref 3.87–5.11)
RDW: 12.2 % (ref 11.5–15.5)
WBC: 10.7 10*3/uL — ABNORMAL HIGH (ref 4.0–10.5)
nRBC: 0 % (ref 0.0–0.2)

## 2021-08-16 LAB — GLUCOSE, CAPILLARY: Glucose-Capillary: 176 mg/dL — ABNORMAL HIGH (ref 70–99)

## 2021-08-16 MED ORDER — POTASSIUM CHLORIDE CRYS ER 20 MEQ PO TBCR
40.0000 meq | EXTENDED_RELEASE_TABLET | Freq: Two times a day (BID) | ORAL | Status: AC
  Administered 2021-08-16 – 2021-08-17 (×3): 40 meq via ORAL
  Filled 2021-08-16 (×3): qty 2

## 2021-08-16 MED ORDER — SODIUM CHLORIDE 0.9 % IV SOLN: INTRAVENOUS | Status: DC

## 2021-08-16 MED ORDER — CHLORHEXIDINE GLUCONATE CLOTH 2 % EX PADS
6.0000 | MEDICATED_PAD | Freq: Every day | CUTANEOUS | Status: DC
  Administered 2021-08-16 – 2021-08-21 (×3): 6 via TOPICAL

## 2021-08-16 MED ORDER — MAGNESIUM SULFATE 2 GM/50ML IV SOLN
2.0000 g | Freq: Once | INTRAVENOUS | Status: AC
  Administered 2021-08-16: 2 g via INTRAVENOUS
  Filled 2021-08-16: qty 50

## 2021-08-16 NOTE — Progress Notes (Signed)
1 Day Post-Op Robotic Right Colectomy Subjective: Feels "shakey" and unsteady on her feet.  Denies nausea, having BM's  Objective: Vital signs in last 24 hours: Temp:  [97 F (36.1 C)-98.6 F (37 C)] 97.8 F (36.6 C) (09/29 0414) Pulse Rate:  [67-104] 72 (09/29 0414) Resp:  [11-21] 18 (09/29 0414) BP: (118-162)/(45-93) 123/46 (09/29 0414) SpO2:  [91 %-100 %] 93 % (09/29 0414) Weight:  [66.6 kg-68.6 kg] 66.6 kg (09/29 0619)   Intake/Output from previous day: 09/28 0701 - 09/29 0700 In: 3006.6 [P.O.:600; I.V.:2206.6; IV Piggyback:200] Out: 1020 [Urine:1000; Blood:20] Intake/Output this shift: Total I/O In: 120 [P.O.:120] Out: -    General appearance: alert and cooperative GI: soft, mild distention   Incision: no significant drainage  Lab Results:  Recent Labs    08/16/21 0519  WBC 10.7*  HGB 9.8*  HCT 28.4*  PLT 210   BMET Recent Labs    08/16/21 0519  NA 127*  K 3.0*  CL 93*  CO2 24  GLUCOSE 177*  BUN 11  CREATININE 0.76  CALCIUM 8.1*   PT/INR No results for input(s): LABPROT, INR in the last 72 hours. ABG No results for input(s): PHART, HCO3 in the last 72 hours.  Invalid input(s): PCO2, PO2  MEDS, Scheduled  acetaminophen  1,000 mg Oral Q6H   acyclovir  400 mg Oral QHS   alvimopan  12 mg Oral BID   Chlorhexidine Gluconate Cloth  6 each Topical Daily   chlorthalidone  25 mg Oral Daily   diltiazem  240 mg Oral Daily   donepezil  10 mg Oral QHS   enoxaparin (LOVENOX) injection  40 mg Subcutaneous Q24H   feeding supplement  237 mL Oral BID BM   fluticasone  2 spray Each Nare Daily   Iloperidone  4 mg Oral QHS   lamoTRIgine  100 mg Oral Daily   levothyroxine  100 mcg Oral Q0600   loratadine  10 mg Oral Daily   melatonin  3 mg Oral QHS   oxybutynin  15 mg Oral Daily   pantoprazole  40 mg Oral Daily   polyethylene glycol  17 g Oral Daily   potassium chloride  40 mEq Oral BID   saccharomyces boulardii  250 mg Oral BID   temazepam  15 mg Oral  QHS   ziprasidone  40 mg Oral QHS    Studies/Results: No results found.  Assessment: s/p Procedure(s): XI ROBOT ASSISTED LAPAROSCOPIC RIGHT COLECTOMY Patient Active Problem List   Diagnosis Date Noted   Colon cancer ([place]) 08/15/2021   Chronic midline back pain 03/24/2019   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/2018   Schizoaffective disorder ([place]) 09/15/2018   Atrial fibrillation ([place]) 04/25/2017   Hyperlipidemia 03/11/2017   Surgery, elective 08/02/2016   Spondylolisthesis, lumbar region 07/31/2016   Hyperlipemia 04/12/2016   Gait disturbance 01/26/2016   Spinal stenosis of lumbar region 01/26/2016   Progressive focal motor weakness 01/12/2016   Muscle atrophy 01/12/2016   Urinary urgency 01/12/2016   Encounter for long-term (current) use of high-risk medication 03/03/2015   Essential hypertension 03/03/2015   Atrial fibrillation with RVR ([place]) 04/18/2014   Palpitations 04/18/2014   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    Plan: Advance diet Hponatremia: fluid switched to NS, advance diet, appreciate medical assistance by [facility] Amb[facility] with assistance PT consult   LOS: 1 day     .Elmo Putt  Marijo Conception, MD [facility], [place]    08/16/2021 10:26 AM

## 2021-08-16 NOTE — Consult Note (Addendum)
Medical Consultation  Kaitlyn Hoover RCB:[id number] DOB: 1940/12/28 DOA: 08/15/2021 PCP: [name], MD   Requesting physician: [name] Date of consultation: 08/16/21 Reason for consultation: Medical Management  Impression/Recommendations Hyponatremia Hypokalemia Hypomagnesemia     - increase fluids to 100cc/hr NS     - stop chlorthalidone     - repeat renal function panel at 1800hrs     - replace K+/Mg2+  A fib on eliquis     - eliquis on hold for at least 48hrs per general surgery; awaiting clearance to resume anticoagulation     - continue dilt as able  Anxiety/Depression Schizoaffective disorder Chronic insomnia     - resume home regimen  Hyperglycemia     - A1c from 9 days ago was 5.0; follow for now  Hypothyroidism     - continue home regimen  Invasive adenocarcinoma of the colon, s/p colectomy     - per primary team; including pain control, placement plans, etc  [facility] will follow-up again tomorrow. Please contact me if I can be of assistance in the meanwhile. Thank you for this consultation.  Chief Complaint: colon CA.  HPI:  Kaitlyn Hoover is a 80 y.o. female with medical history significant of a fib, GERD, HTN, hypothyroidism. Presenting with colon mass. Recent colonoscopy showed obstructing mass in the transversolon. Bx determined it was an invasive adenocarcinoma. It was recommended that she have a right colectomy. Admitted to gen surgery on 9/28 and successfully completed her procedure. Apparently overnight, she had some confusion. AM labs were acquired and it was shown to have hyponatremia and hypokalemia. TRH was called for management of these conditions and her chronic illnesses.   Review of Systems:  She reports feeling anxious and weak. She reports abdominal soreness. Otherwise, remainder of ROS is negative for all not mentioned in HPI.   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   Past Surgical History:  Procedure Laterality Date   ABDOMINAL HYSTERECTOMY     ATRIAL FIBRILLATION ABLATION N/A 04/25/2017   Procedure: Atrial Fibrillation Ablation;  Surgeon: Constance Haw, MD;  Location: [facility];  Service: Cardiovascular;  Laterality: N/A;   BREAST EXCISIONAL BIOPSY Bilateral    benign   CATARACT EXTRACTION W/ INTRAOCULAR LENS  IMPLANT, BILATERAL     CERVICAL LAMINECTOMY     CESAREAN SECTION     X2    LEGS      AGE [age]   SEVERAL SURGERIES FOR POLIO    SVT ABLATION N/A 11/26/2017   Procedure: SVT ABLATION;  Surgeon: Constance Haw, MD;  Location: [facility];  Service: Cardiovascular;  Laterality: N/A;   THYROIDECTOMY     Social History:  reports that she quit smoking about 24 years ago. Her smoking use included cigarettes. She started smoking about 49 years ago. She has a 25.00 pack-year smoking history. She has never used smokeless tobacco. She reports that she does not currently use alcohol. She reports that she does not use drugs.  Allergies  Allergen Reactions   Sulfa Antibiotics Rash and Other (See  Comments)    Childhood allergy   Ciprocinonide [Fluocinolone] Rash   Ciprofloxacin Rash   Family History  Problem Relation Age of Onset   Hypertension Mother    Cancer Father        JAW BONE   COPD Brother    Atrial fibrillation Brother     Prior to Admission medications   Medication Sig Start Date End Date Taking? Authorizing Provider  acyclovir (ZOVIRAX) 400 MG tablet Take 400 mg at bedtime by mouth.  02/05/14  Yes [provider]  alendronate (FOSAMAX) 70 MG tablet Take 70 mg by mouth every Sunday. Take with a full glass of water on an empty stomach.   Yes [provider]  Ascorbic Acid (VITAMIN C) 1000 MG tablet Take 1,000 mg by mouth daily.   Yes [provider]  b complex vitamins capsule Take 1 capsule by mouth daily.   Yes [provider]  Calcium Citrate-Vitamin D (CALCIUM CITRATE + D PO) Take 1,200 mg by mouth at bedtime.   Yes [provider]  chlorthalidone (HYGROTON) 25 MG tablet TAKE 1 TABLET BY MOUTH EVERY DAY 11/13/20  Yes Camnitz, Ocie Doyne, MD  cholecalciferol (VITAMIN D) 25 MCG (1000 UNIT) tablet Take 1,000 Units by mouth daily.   Yes [provider]  Coenzyme Q10 (CO Q-10) 100 MG CAPS Take 100 mg by mouth daily.    Yes [provider]  D-Ribose (RIBOSE, D,) POWD Take 2 g by mouth daily.    Yes [provider]  diltiazem (CARDIZEM CD) 240 MG 24 hr capsule TAKE 1 CAPSULE BY MOUTH EVERY DAY 11/20/20  Yes Camnitz, Will Hassell Done, MD  donepezil (ARICEPT) 10 MG tablet Take 1 tablet (10 mg total) by mouth daily. Patient taking differently: Take 10 mg by mouth at bedtime. 03/08/21  Yes Cottle, Billey Co., MD  ELIQUIS 5 MG TABS tablet TAKE 1 TABLET BY MOUTH TWICE A DAY 03/05/21  Yes Camnitz, Will Hassell Done, MD  ferrous sulfate 324 MG TBEC Take 324 mg by mouth daily with breakfast.   Yes [provider]  fexofenadine (ALLEGRA) 180 MG tablet Take 180 mg by mouth daily.   Yes [provider]  fluticasone (FLONASE) 50 MCG/ACT nasal spray Place 2 sprays into both nostrils daily.   Yes [provider]  GAVILAX 17 GM/SCOOP powder Take 17 g by mouth daily. Miralax 07/03/21  Yes [provider]  Ginkgo Biloba 120 MG TABS Take 120 mg by mouth daily.    Yes [provider]  iloperidone (FANAPT) 4 MG TABS tablet Take 1 tablet (4 mg total) by mouth at bedtime. Patient taking differently: Take 1 mg by mouth at bedtime. 10/30/20  Yes Cottle, Billey Co., MD  lamoTRIgine (LAMICTAL) 100 MG tablet Take 1 tablet (100 mg total) by mouth daily. 03/08/21  Yes Cottle, Billey Co., MD  levothyroxine (SYNTHROID, LEVOTHROID) 100 MCG tablet Take 100 mcg by mouth daily before breakfast.  02/16/14  Yes [provider]  MAGNESIUM PO Take 250 mg by mouth every evening.   Yes [provider]  Melatonin 3 MG TABS Take 3 mg by mouth at bedtime.   Yes [provider]  Multiple Vitamin (MULTIVITAMIN) capsule Take 1 capsule daily by mouth.    Yes [provider]  Omega-3 Fatty Acids (FISH OIL BURP-LESS) 1200 MG CAPS Take 1,200 mg by mouth daily.   Yes [provider]  oxybutynin (DITROPAN XL) 15 MG 24 hr tablet Take 15  mg by mouth daily.    Yes [provider]  pantoprazole (PROTONIX) 40 MG tablet Take 40 mg by mouth daily. 06/22/21  Yes [provider]  rosuvastatin (CRESTOR) 10 MG tablet TAKE 1 TABLET UP TO 4 TIMES A WEEK ON SUNDAY, MONDAY, [facility] AND Friday. Please make overdue appt with Dr. Marlou Porch before anymore refills. Thank you 2nd attempt 02/12/21  Yes Camnitz, Will Hassell Done, MD  sodium chloride (OCEAN) 0.65 % SOLN nasal spray Place 2 sprays into both nostrils every evening.   Yes [provider]  temazepam (RESTORIL) 15 MG capsule Take 1 capsule (15 mg total) by mouth at bedtime. 03/08/21  Yes Cottle, Billey Co., MD  Turmeric 500 MG CAPS Take 1,000 mg by mouth daily.   Yes [provider]  vitamin B-12 (CYANOCOBALAMIN) 1000 MCG tablet Take 1,000 mcg by mouth daily.   Yes [provider]  ziprasidone (GEODON) 40 MG capsule TAKE 1 CAPSULE BY MOUTH EVERY DAY Patient taking differently: Take 40 mg by mouth at bedtime. 12/04/20  Yes Cottle, Billey Co., MD  azelastine (OPTIVAR) 0.05 % ophthalmic solution Place 2 drops into both eyes daily as needed. Inching eyes 04/02/18   [provider]  traMADol (ULTRAM) 50 MG tablet Take 1 tablet (50 mg total) by mouth 3 (three) times daily as needed. Patient taking differently: Take 50 mg by mouth 3 (three) times daily as needed for severe pain or  moderate pain. 03/24/19   Sater, Nanine Means, MD   Physical Exam: Blood pressure (!) 123/46, pulse 72, temperature 97.8 F (36.6 C), temperature source Oral, resp. rate 18, height 5\' 2"  (1.575 m), weight 66.6 kg, SpO2 93 %. Vitals:   08/16/21 0347 08/16/21 0414  BP:  (!) 123/46  Pulse:  72  Resp:  18  Temp:  97.8 F (36.6 C)  SpO2: 96% 93%    General: 80 y.o. female resting in chair in NAD Eyes: PERRL, normal sclera ENMT: Nares patent w/o discharge, orophaynx clear, dentition normal, ears w/o discharge/lesions/ulcers Neck: Supple, trachea midline Cardiovascular: RRR, +S1, S2, no m/g/r, equal pulses throughout Respiratory: CTABL, no w/r/r, normal WOB GI: BS hypoactive, ND, global TTP, no masses noted, no organomegaly noted MSK: No e/c/c Skin: No rashes, bruises, ulcerations noted Neuro: A&O x 3, no focal deficits Psyc: Appropriate interaction and affect, calm/cooperative  Labs on Admission:  Basic Metabolic Panel: Recent Labs  Lab 08/16/21 0519  NA 127*  K 3.0*  CL 93*  CO2 24  GLUCOSE 177*  BUN 11  CREATININE 0.76  CALCIUM 8.1*   Liver Function Tests: No results for input(s): AST, ALT, ALKPHOS, BILITOT, PROT, ALBUMIN in the last 168 hours. No results for input(s): LIPASE, AMYLASE in the last 168 hours. No results for input(s): AMMONIA in the last 168 hours. CBC: Recent Labs  Lab 08/16/21 0519  WBC 10.7*  HGB 9.8*  HCT 28.4*  MCV 89.9  PLT 210   Cardiac Enzymes: No results for input(s): CKTOTAL, CKMB, CKMBINDEX, TROPONINI in the last 168 hours. BNP: Invalid input(s): POCBNP CBG: Recent Labs  Lab 08/16/21 0339  GLUCAP 176*    Radiological Exams on Admission: No results found.  EKG: None obtained by primary team  [facility]  If 7PM-7AM, please contact night-coverage [email or web address] 08/16/2021, 10:19 AM

## 2021-08-16 NOTE — Significant Event (Addendum)
Rapid Response Event Note   Reason for Call :  Decreased responsiveness, change in condition.  Initial Focused Assessment:  When arrived, patient was awake, but drowsy. She followed all commands and was oriented to person, place, and situation. CBG checked and WNL. Vital signs unremarkable. Afebrile. Lung sounds clear, heart sounds normal, good bowel sounds, no peripheral edema, incisions appear clean, dry, and intact. Bedside RN reports adequate urinary output this shift. Oral cavity is dry and speech seems "thick-tongued". Sips of water offered, but patient too sleepy to really take much orally at this time. Patient displays intermittent jerking movements in extremities and appears uncoordinated. Patient states the only medication on MAR new to her is Neurontin.    Interventions:  Primary RN and RR RN spoke to Dr. Marcello Moores to inform of change. Dr. Marcello Moores stated Neurontin may be discontinued, but otherwise did not add any new orders. Advised that the combination of recent anesthesia and meds given this shift may be contributing to drowsiness. Since patient able to follow commands and is oriented, will not pursue additional diagnostic testing at this time. Patient due for scheduled labs this morning.  Plan of Care:  Continue to monitor. Call back for any changes in condition. Hold off on any sedating meds. Patient acknowledged she will not attempt getting out of bed unassisted. Staff utilizing full safety precautions. Surgical service will follow up on her during dayshift.    Event Summary:   MD Notified: 0340 Call Time: [place] Time: 5361 End Time: 4431   Addendum: at 5400 informed by bedside RN that labs show hyponatremia (Na 127) and hypokalemia (3.0). Advised to page attending MD as pt may need change with IV fluid and potassium replacement. As of 0615, no new orders, but surgeons usually round near this time. Patient is otherwise in no acute distress and had a bowel  movement.  Selinda Michaels, RN

## 2021-08-16 NOTE — Progress Notes (Signed)
    08/16/21 0330  Vitals  BP (!) 129/51  MAP (mmHg) 74  BP Method Automatic  Pulse Rate 82  Pulse Rate Source Monitor  Resp 20  Level of Consciousness  Level of Consciousness Responds to Voice  MEWS COLOR  MEWS Score Color Green  Oxygen Therapy  SpO2 91 %  O2 Device Room Air  Pain Assessment  Pain Scale 0-10  Pain Score 3  Pain Type Surgical pain  Pain Location Abdomen  Pain Orientation Right;Anterior  Pain Descriptors / Indicators Sharp;Discomfort  Pain Frequency Occasional  Pain Onset With Activity  Patients Stated Pain Goal 3  Pain Intervention(s) Refused;Repositioned;Emotional support (pt states she is only in pain when she is on her back, on her side she dos not feel the sharp pain)  MEWS Score  MEWS Temp 0  MEWS Systolic 0  MEWS Pulse 0  MEWS RR 0  MEWS LOC 1  MEWS Score 1  Provider Notification  Provider Name/Title A Thomas  Date Provider Notified 08/16/21  Time Provider Notified [phone]  Notification Type Page  Notification Reason Change in status (pt with increased lethargy, increased arm & leg jerks, unable to get out of bed)  Provider response See new orders  Date of Provider Response 08/16/21  Time of Provider Response 0349  Rapid Response Notification  Name of Rapid Response RN Notified [facility]  Date Rapid Response Notified 08/16/21  Time Rapid Response Notified 0330  Note  Observations CBG 176   Pt progressively becoming more lethargic throughout the night. Pt is currently oriented and aware of medications taken HS and states she wants to see a neurologist in the morning for her arm and leg jerks as they are not normal to her. Around bedtime she was able to stand at bedside, arm and leg jerks were mild at that time. This attempt to sit on side of the bed was unsuccessful. Rapid response was made aware of patient condition and recommended CBG. CBG was obtained and resulted 176. Rapid response checked orientation and other essential assessments and  recommended contacting on call for general surgery. Joyice Faster, MD was contacted and made aware. See new orders.

## 2021-08-16 NOTE — Progress Notes (Signed)
Pharmacy Brief Note - Alvimopan (Entereg)  The standing order set for alvimopan (Entereg) now includes an automatic order to discontinue the drug after the patient has had a bowel movement. The change was approved by the [facility] and the Medical Executive Committee.   This patient has had bowel movements documented by nursing. Therefore, alvimopan has been discontinued. If there are questions, please contact the pharmacy at [phone].   Thank you-  Peggyann Juba, PharmD, BCPS 08/16/2021 10:34 AM

## 2021-08-17 LAB — CBC
HCT: 29.1 % — ABNORMAL LOW (ref 36.0–46.0)
Hemoglobin: 9.6 g/dL — ABNORMAL LOW (ref 12.0–15.0)
MCH: 31.3 pg (ref 26.0–34.0)
MCHC: 33 g/dL (ref 30.0–36.0)
MCV: 94.8 fL (ref 80.0–100.0)
Platelets: 207 10*3/uL (ref 150–400)
RBC: 3.07 MIL/uL — ABNORMAL LOW (ref 3.87–5.11)
RDW: 13 % (ref 11.5–15.5)
WBC: 10.8 10*3/uL — ABNORMAL HIGH (ref 4.0–10.5)
nRBC: 0 % (ref 0.0–0.2)

## 2021-08-17 LAB — BASIC METABOLIC PANEL
Anion gap: 6 (ref 5–15)
BUN: 11 mg/dL (ref 8–23)
CO2: 25 mmol/L (ref 22–32)
Calcium: 8 mg/dL — ABNORMAL LOW (ref 8.9–10.3)
Chloride: 104 mmol/L (ref 98–111)
Creatinine, Ser: 0.76 mg/dL (ref 0.44–1.00)
GFR, Estimated: >60 mL/min (ref >60)
Glucose, Bld: 113 mg/dL — ABNORMAL HIGH (ref 70–99)
Potassium: 4.1 mmol/L (ref 3.5–5.1)
Sodium: 135 mmol/L (ref 135–145)

## 2021-08-17 LAB — MAGNESIUM: Magnesium: 2.3 mg/dL (ref 1.7–2.4)

## 2021-08-17 MED ORDER — APIXABAN 5 MG PO TABS
5.0000 mg | ORAL_TABLET | Freq: Every day | ORAL | Status: DC
  Administered 2021-08-18 – 2021-08-20 (×3): 5 mg via ORAL
  Filled 2021-08-17 (×4): qty 1

## 2021-08-17 MED ORDER — ILOPERIDONE 2 MG PO TABS
1.0000 mg | ORAL_TABLET | Freq: Every day | ORAL | Status: DC
  Administered 2021-08-17 – 2021-08-22 (×6): 1 mg via ORAL
  Filled 2021-08-17 (×6): qty 1

## 2021-08-17 NOTE — Progress Notes (Signed)
Patient refused Iloperidone 4 mg and reports she only takes 1 mg at home. Patient reports she has also been occasionally hallucinating x 2 days. Patient questioned, if she had reported medication problem or hallucinations and denies. Patient currently alert & oriented.

## 2021-08-17 NOTE — Progress Notes (Addendum)
[facility] PROGRESS NOTE  TAREA SKILLMAN  HOZ:[id number] DOB: [birth year] DOA: 08/15/2021 PCP: [name], MD Requesting physician: [name] Date of consultation: 08/16/21 Reason for consultation: Medical Management  Subjective: Had shakes and weakness yesterday but resolved today. She's eating better, having BMs, willing to ambulate. Feels better all around.  Objective: BP 122/88 (BP Location: Left Arm)   Pulse 83   Temp 98 F (36.7 C) (Oral)   Resp 18   Ht 5\' 2"  (7.048 m)   Wt 66.6 kg   LMP  (LMP Unknown)   SpO2 91%   BMI 26.85 kg/m   Gen: Nontoxic older female in no distress Pulm: Clear and nonlabored.   CV: RRR, no murmur, no JVD, no edema GI: Soft, mildly tender, ND, +BS  Neuro: Alert and oriented. No focal deficits. Ext: Warm, no deformities Skin: Incision without exudate or induration.  Assessment & Plan: Hyponatremia Hypokalemia Hypomagnesemia - Resolved with isotonic IVF and supplements.  - Continue to hold thiazide diuretic for now, though may have to repeat supplementation as its effect will remain for a couple days.     Hypoxia, NOS:  - Incentive spirometry - Appears euvolemic, stop IVF - No suggestion of pneumonia at this time.  - Will attempt to wean off oxygen today. If unable to do so, check CXR in AM.   Paroxysmal atrial fibrillation: Followed by Dr. 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 RRR currently. - Continue home diltiazem. - Restart eliquis 10/1 per surgery. CHA2DS2-VASc score is 5.    HTN:  - Hold chlorthalidone. Has a long half life.  Anxiety, depression, schizoaffective disorder, insomnia: Chronic and quiescent. - Continue home ziprasidone, iloperidone, lamictal, temazepam. Pt also on donepezil though no mention of dementia in her records. No objection to continuing it here.   Hyperglycemia: Mild. HbA1c 5%  - Monitor on metabolic panel. Fasting glucose today 113mg /dl.  HLD:  - Can hold statin  acutely, but ok to restart at discharge    Hypothyroidism:  - Continue stable dose synthroid.   Invasive adenocarcinoma of the colon, s/p robot-assisted laparoscopic colectomy by Dr. Marcello Moores 9/28. - Plans per primary team.   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 - All the more important to emphasize early and frequent mobility.  Leukocytosis: Without fever. Most likely reactive postoperatively.  - Monitor off abx for now  [name], MD [facility] [email or web address] 08/17/2021, 12:16 PM

## 2021-08-17 NOTE — Care Management Important Message (Signed)
Medicare IM printed for Social Work at [facility] to be given to the patient 

## 2021-08-17 NOTE — Progress Notes (Signed)
2 Days Post-Op Robotic Right Colectomy Subjective: Feels better today.  Denies nausea, having BM's.  Tolerating soft diet.  Pain controlled  Objective: Vital signs in last 24 hours: Temp:  [97.8 F (36.6 C)-98.6 F (37 C)] 98.6 F (37 C) (09/30 0515) Pulse Rate:  [64-87] 64 (09/30 0515) Resp:  [16-20] 16 (09/30 0515) BP: (107-143)/(41-54) 107/54 (09/30 0515) SpO2:  [94 %-99 %] 94 % (09/30 0515)   Intake/Output from previous day: 09/29 0701 - 09/30 0700 In: 3257 [P.O.:720; I.V.:2518.8; IV Piggyback:18.2] Out: 6767 [Urine:1750] Intake/Output this shift: No intake/output data recorded.   General appearance: alert and cooperative GI: soft, mild distention   Incision: no significant drainage  Lab Results:  Recent Labs    08/16/21 0519 08/17/21 0428  WBC 10.7* 10.8*  HGB 9.8* 9.6*  HCT 28.4* 29.1*  PLT 210 207    BMET Recent Labs    08/16/21 1904 08/17/21 0428  NA 134* 135  K 3.1* 4.1  CL 100 104  CO2 28 25  GLUCOSE 113* 113*  BUN 11 11  CREATININE 0.79 0.76  CALCIUM 8.1* 8.0*    PT/INR No results for input(s): LABPROT, INR in the last 72 hours. ABG No results for input(s): PHART, HCO3 in the last 72 hours.  Invalid input(s): PCO2, PO2  MEDS, Scheduled  acetaminophen  1,000 mg Oral Q6H   acyclovir  400 mg Oral QHS   [START ON 08/18/2021] apixaban  5 mg Oral Daily   Chlorhexidine Gluconate Cloth  6 each Topical Daily   diltiazem  240 mg Oral Daily   donepezil  10 mg Oral QHS   feeding supplement  237 mL Oral BID BM   fluticasone  2 spray Each Nare Daily   Iloperidone  1 mg Oral QHS   lamoTRIgine  100 mg Oral Daily   levothyroxine  100 mcg Oral Q0600   loratadine  10 mg Oral Daily   melatonin  3 mg Oral QHS   oxybutynin  15 mg Oral Daily   pantoprazole  40 mg Oral Daily   polyethylene glycol  17 g Oral Daily   potassium chloride  40 mEq Oral BID   saccharomyces boulardii  250 mg Oral BID   temazepam  15 mg Oral QHS   ziprasidone  40 mg Oral QHS     Studies/Results: No results found.  Assessment: s/p Procedure(s): XI ROBOT ASSISTED LAPAROSCOPIC RIGHT COLECTOMY Patient Active Problem List   Diagnosis Date Noted   Colon cancer ([place]) 08/15/2021   Chronic midline back pain 03/24/2019   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/2018   Schizoaffective disorder ([place]) 09/15/2018   Atrial fibrillation ([place]) 04/25/2017   Hyperlipidemia 03/11/2017   Surgery, elective 08/02/2016   Spondylolisthesis, lumbar region 07/31/2016   Hyperlipemia 04/12/2016   Gait disturbance 01/26/2016   Spinal stenosis of lumbar region 01/26/2016   Progressive focal motor weakness 01/12/2016   Muscle atrophy 01/12/2016   Urinary urgency 01/12/2016   Encounter for long-term (current) use of high-risk medication 03/03/2015   Essential hypertension 03/03/2015   Atrial fibrillation with RVR ([place]) 04/18/2014   Palpitations 04/18/2014   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    Plan: Cont diet SL IVF Hponatremia: better today, will d/c IVF's Ambulate with assistance PT eval  Wean O2 as tolerated Elliquis written to restart tom am   LOS: 2 days     .Gifford Shave  Marcello Moores, [facility], [place]    08/17/2021 8:41 AM

## 2021-08-17 NOTE — Evaluation (Signed)
Physical Therapy Evaluation Patient Details Name: Kaitlyn Hoover MRN: [id number] DOB: 15-Apr-1941 Today's Date: 08/17/2021  History of Present Illness  Patient is 80 y.o. female s/p robotic right colectomy on 9/28 due to colon mass. 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 PMH significant for anxiety, OA, Afib, bipolar disorder, GERD, HTN, hypothyroidism, post-polio syndrome with Rt sided weakness.    Clinical Impression  Kaitlyn Hoover is 80 y.o. female admitted with above HPI and diagnosis. Patient is currently limited by functional impairments below (see PT problem list). Patient lives in a home with her brother who has a downstairs apartment and is independent with no device at baseline. Currently pt required min assist for bed mobility and transfers with RW. Patient will benefit from continued skilled PT interventions to address impairments and progress independence with mobility, recommending HHPT follow up with 24/7 assist/supervision currently. Pt has brother and friend who can stay with her to assist. Acute PT will follow and progress as able.        Recommendations for follow up therapy are one component of a multi-disciplinary discharge planning process, led by the attending physician.  Recommendations may be updated based on patient status, additional functional criteria and insurance authorization.  Follow Up Recommendations Home health PT;Supervision/Assistance - 24 hour    Equipment Recommendations  Rolling walker with 5" wheels    Recommendations for Other Services       Precautions / Restrictions Precautions Precautions: Fall Restrictions Weight Bearing Restrictions: No      Mobility  Bed Mobility Overal bed mobility: Needs Assistance Bed Mobility: Rolling;Sidelying to Sit Rolling: Min assist Sidelying to sit: Min assist       General bed mobility comments: cues or sequencing roll  to Lt side and min assist to complete. Assist to initiate bringing LE's off EOB and raise trunk upright.    Transfers Overall transfer level: Needs assistance Equipment used: Rolling walker (2 wheeled) Transfers: Sit to/from [facility] Sit to Stand: Min assist Stand pivot transfers: Min assist       General transfer comment: cues for hand placement but pt placing both on RW to rise. min assist to steady with stand and complete power up. Min assist for walker management and to guide steps to move bed>chair.  Ambulation/Gait                Stairs            Wheelchair Mobility    Modified Rankin (Stroke Patients Only)       Balance Overall balance assessment: Needs assistance Sitting-balance support: Feet supported Sitting balance-Leahy Scale: Good     Standing balance support: During functional activity;Bilateral upper extremity supported Standing balance-Leahy Scale: Poor                               Pertinent Vitals/Pain Pain Assessment: Faces Faces Pain Scale: Hurts a little bit Pain Location: generalized with mobility Pain Descriptors / Indicators: Aching;Discomfort Pain Intervention(s): Limited activity within patient's tolerance;Monitored during session;Repositioned    Home Living Family/patient expects to be discharged to:: Private residence Living Arrangements: Other relatives (brother lives in basement apartment) Available Help at Discharge: Family;Friend(s) Type of Home: House Home Access: Stairs to enter Entrance Stairs-Rails: Psychiatric nurse of Steps: 3 Home Layout: One level (brother in basement, pt doesn't go down there) Home Equipment: Walker - 2 wheels (maybe has  RW, not certain)      Prior Function Level of Independence: Independent               Hand Dominance        Extremity/Trunk Assessment   Upper Extremity Assessment Upper Extremity Assessment: Generalized  weakness    Lower Extremity Assessment Lower Extremity Assessment: Generalized weakness    Cervical / Trunk Assessment Cervical / Trunk Assessment: Kyphotic  Communication   Communication: No difficulties  Cognition Arousal/Alertness: Awake/alert Behavior During Therapy: WFL for tasks assessed/performed Overall Cognitive Status: Within Functional Limits for tasks assessed                                        General Comments      Exercises     Assessment/Plan    PT Assessment Patient needs continued PT services  PT Problem List Decreased strength;Decreased activity tolerance;Decreased balance;Decreased mobility;Decreased knowledge of use of DME;Decreased safety awareness;Decreased knowledge of precautions       PT Treatment Interventions Gait training;DME instruction;Functional mobility training;Stair training;Therapeutic activities;Therapeutic exercise;Balance training;Patient/family education    PT Goals (Current goals can be found in the Care Plan section)  Acute Rehab PT Goals Patient Stated Goal: get home and regain independence and strength PT Goal Formulation: With patient Time For Goal Achievement: 08/31/21 Potential to Achieve Goals: Good    Frequency Min 3X/week   Barriers to discharge        Co-evaluation               AM-PAC PT "6 Clicks" Mobility  Outcome Measure Help needed turning from your back to your side while in a flat bed without using bedrails?: A Little Help needed moving from lying on your back to sitting on the side of a flat bed without using bedrails?: A Little Help needed moving to and from a bed to a chair (including a wheelchair)?: A Little Help needed standing up from a chair using your arms (e.g., wheelchair or bedside chair)?: A Little Help needed to walk in hospital room?: A Lot Help needed climbing 3-5 steps with a railing? : A Lot 6 Click Score: 16    End of Session Equipment Utilized During  Treatment: Gait belt Activity Tolerance: Patient tolerated treatment well;Patient limited by fatigue Patient left: in chair;with call bell/phone within reach Nurse Communication: Mobility status PT Visit Diagnosis: Muscle weakness (generalized) (M62.81);Difficulty in walking, not elsewhere classified (R26.2);Unsteadiness on feet (R26.81)    Time: 3244-0102 PT Time Calculation (min) (ACUTE ONLY): 20 min   Charges:   PT Evaluation $PT Eval Low Complexity: 1 Low          Verner Mould, DPT Acute Rehabilitation Services Office [phone] Pager [phone]   Jacques Navy 08/17/2021, 4:47 PM

## 2021-08-18 ENCOUNTER — Inpatient Hospital Stay (HOSPITAL_COMMUNITY): Payer: PPO

## 2021-08-18 LAB — BASIC METABOLIC PANEL
Anion gap: 4 — ABNORMAL LOW (ref 5–15)
BUN: 17 mg/dL (ref 8–23)
CO2: 26 mmol/L (ref 22–32)
Calcium: 8.1 mg/dL — ABNORMAL LOW (ref 8.9–10.3)
Chloride: 105 mmol/L (ref 98–111)
Creatinine, Ser: 0.7 mg/dL (ref 0.44–1.00)
GFR, Estimated: >60 mL/min (ref >60)
Glucose, Bld: 117 mg/dL — ABNORMAL HIGH (ref 70–99)
Potassium: 4.7 mmol/L (ref 3.5–5.1)
Sodium: 135 mmol/L (ref 135–145)

## 2021-08-18 LAB — PROCALCITONIN: Procalcitonin: 0.39 ng/mL

## 2021-08-18 MED ORDER — HYDROMORPHONE HCL 1 MG/ML IJ SOLN
0.5000 mg | INTRAMUSCULAR | Status: DC | PRN
Start: 2021-08-18 — End: 2021-08-23

## 2021-08-18 MED ORDER — AMOXICILLIN-POT CLAVULANATE 875-125 MG PO TABS
1.0000 | ORAL_TABLET | Freq: Two times a day (BID) | ORAL | Status: DC
  Administered 2021-08-18 – 2021-08-19 (×3): 1 via ORAL
  Filled 2021-08-18 (×3): qty 1

## 2021-08-18 MED ORDER — AMOXICILLIN-POT CLAVULANATE 875-125 MG PO TABS: 1.0000 | ORAL_TABLET | Freq: Two times a day (BID) | ORAL | Status: DC

## 2021-08-18 NOTE — Progress Notes (Signed)
Physical Therapy Treatment Patient Details Name: Kaitlyn Hoover MRN: [id number] DOB: Jun 21, 1941 Today's Date: 08/18/2021   History of Present Illness Patient is 80 y.o. female s/p robotic right colectomy on 9/28 due to colon mass. 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 PMH significant for anxiety, OA, Afib, bipolar disorder, GERD, HTN, hypothyroidism, post-polio syndrome with Rt sided weakness.    PT Comments    Pt feeling better today than yesterday and progressed with RW and min guard to ambulating with RW in hallway today. O2 sats at 96% room air after ambulation. Pt fatigued very quickly. Encouraged pt and nursing to ambulate again in hallway this afternoon. Still recommending HHPT to get pt back to her PLOF of independence and driving. Also will need RW if her son cannot locate a family members.     Recommendations for follow up therapy are one component of a multi-disciplinary discharge planning process, led by the attending physician.  Recommendations may be updated based on patient status, additional functional criteria and insurance authorization.  Follow Up Recommendations  Home health PT;Supervision/Assistance - 24 hour     Equipment Recommendations  Rolling walker with 5" wheels    Recommendations for Other Services       Precautions / Restrictions       Mobility  Bed Mobility Overal bed mobility: Needs Assistance Bed Mobility: Rolling;Sidelying to Sit Rolling: Min guard Sidelying to sit: Min guard       General bed mobility comments: cues or sequencing roll to Lt side and min assist to complete. Assist to initiate bringing LE's off EOB and raise trunk upright. Cues to remind pt pof log roll to prevent abdominal pressure and pain.    Transfers Overall transfer level: Needs assistance Equipment used: Rolling walker (2 wheeled) Transfers: Sit to/from [facility] Sit  to Stand: Min assist Stand pivot transfers: Min assist          Ambulation/Gait Ambulation/Gait assistance: Min guard Gait Distance (Feet): 100 Feet Assistive device: Rolling walker (2 wheeled) Gait Pattern/deviations: Step-through pattern     General Gait Details: limp from pre exiting polio, however pt sstated she did nto use RW prior to admission. Pt still feel weak and ambulation was very tiring for her, however feels better than yesterday. Slowly making progress .   Stairs             Wheelchair Mobility    Modified Rankin (Stroke Patients Only)       Balance Overall balance assessment: Needs assistance Sitting-balance support: Feet supported Sitting balance-Leahy Scale: Good     Standing balance support: During functional activity;Bilateral upper extremity supported Standing balance-Leahy Scale: Poor                              Cognition Arousal/Alertness: Awake/alert Behavior During Therapy: WFL for tasks assessed/performed Overall Cognitive Status: Within Functional Limits for tasks assessed                                        Exercises      General Comments        Pertinent Vitals/Pain Faces Pain Scale: Hurts a little bit Pain Location: generalized with mobility, abdomen Pain Descriptors / Indicators: Aching;Discomfort    Home Living  Prior Function            PT Goals (current goals can now be found in the care plan section) Acute Rehab PT Goals Patient Stated Goal: get home and regain independence and strength PT Goal Formulation: With patient Time For Goal Achievement: 08/31/21 Potential to Achieve Goals: Good Progress towards PT goals: Progressing toward goals    Frequency    Min 3X/week      PT Plan Current plan remains appropriate    Co-evaluation              AM-PAC PT "6 Clicks" Mobility   Outcome Measure  Help needed turning from your back to  your side while in a flat bed without using bedrails?: A Little Help needed moving from lying on your back to sitting on the side of a flat bed without using bedrails?: A Little Help needed moving to and from a bed to a chair (including a wheelchair)?: A Little Help needed standing up from a chair using your arms (e.g., wheelchair or bedside chair)?: A Little Help needed to walk in hospital room?: A Little Help needed climbing 3-5 steps with a railing? : A Little 6 Click Score: 18    End of Session Equipment Utilized During Treatment: Gait belt Activity Tolerance: Patient tolerated treatment well;Patient limited by fatigue Patient left: in bed;with call bell/phone within reach;with bed alarm set Nurse Communication: Mobility status PT Visit Diagnosis: Muscle weakness (generalized) (M62.81);Difficulty in walking, not elsewhere classified (R26.2);Unsteadiness on feet (R26.81)     Time: 7673-4193 PT Time Calculation (min) (ACUTE ONLY): 27 min  Charges:  $Gait Training: 23-37 mins                     Myliyah Rebuck, PT, MPT [place] Office: [phone] Pager: [phone] 08/18/2021    Clide Dales 08/18/2021, 12:43 PM

## 2021-08-18 NOTE — Discharge Instructions (Signed)
SURGERY: POST OP INSTRUCTIONS (Surgery for small bowel obstruction, colon resection, etc)   ######################################################################  EAT Gradually transition to a high fiber diet with a fiber supplement over the next few days after discharge  WALK Walk an hour a day.  Control your pain to do that.    CONTROL PAIN Control pain so that you can walk, sleep, tolerate sneezing/coughing, go up/down stairs.  HAVE A BOWEL MOVEMENT DAILY Keep your bowels regular to avoid problems.  OK to try a laxative to override constipation.  OK to use an antidairrheal to slow down diarrhea.  Call if not better after 2 tries  CALL IF YOU HAVE PROBLEMS/CONCERNS Call if you are still struggling despite following these instructions. Call if you have concerns not answered by these instructions  ######################################################################   DIET Follow a light diet the first few days at home.  Start with a bland diet such as soups, liquids, starchy foods, low fat foods, etc.  If you feel full, bloated, or constipated, stay on a ful liquid or pureed/blenderized diet for a few days until you feel better and no longer constipated. Be sure to drink plenty of fluids every day to avoid getting dehydrated (feeling dizzy, not urinating, etc.). Gradually add a fiber supplement to your diet over the next week.  Gradually get back to a regular solid diet.  Avoid fast food or heavy meals the first week as you are more likely to get nauseated. It is expected for your digestive tract to need a few months to get back to normal.  It is common for your bowel movements and stools to be irregular.  You will have occasional bloating and cramping that should eventually fade away.  Until you are eating solid food normally, off all pain medications, and back to regular activities; your bowels will not be normal. Focus on eating a low-fat, high fiber diet the rest of your life  (See Getting to Good Bowel Health, below).  CARE of your INCISION or WOUND  It is good for closed incisions and even open wounds to be washed every day.  Shower every day.  Short baths are fine.  Wash the incisions and wounds clean with soap & water.    You may leave closed incisions open to air if it is dry.   You may cover the incision with clean gauze & replace it after your daily shower for comfort.  STAPLES: You have skin staples.  Leave them in place & set up an appointment for them to be removed by a surgery office nurse ~10 days after surgery. = 1st week of January 2024    ACTIVITIES as tolerated Start light daily activities --- self-care, walking, climbing stairs-- beginning the day after surgery.  Gradually increase activities as tolerated.  Control your pain to be active.  Stop when you are tired.  Ideally, walk several times a day, eventually an hour a day.   Most people are back to most day-to-day activities in a few weeks.  It takes 4-8 weeks to get back to unrestricted, intense activity. If you can walk 30 minutes without difficulty, it is safe to try more intense activity such as jogging, treadmill, bicycling, low-impact aerobics, swimming, etc. Save the most intensive and strenuous activity for last (Usually 4-8 weeks after surgery) such as sit-ups, heavy lifting, contact sports, etc.  Refrain from any intense heavy lifting or straining until you are off narcotics for pain control.  You will have off days, but things should improve   week-by-week. DO NOT PUSH THROUGH PAIN.  Let pain be your guide: If it hurts to do something, don't do it.  Pain is your body warning you to avoid that activity for another week until the pain goes down. You may drive when you are no longer taking narcotic prescription pain medication, you can comfortably wear a seatbelt, and you can safely make sudden turns/stops to protect yourself without hesitating due to pain. You may have sexual intercourse when it  is comfortable. If it hurts to do something, stop.  MEDICATIONS Take your usually prescribed home medications unless otherwise directed.   Blood thinners:  Usually you can restart any strong blood thinners after the second postoperative day.  It is OK to take aspirin right away.     If you are on strong blood thinners (warfarin/Coumadin, Plavix, Xerelto, Eliquis, Pradaxa, etc), discuss with your surgeon, medicine PCP, and/or cardiologist for instructions on when to restart the blood thinner & if blood monitoring is needed (PT/INR blood check, etc).     PAIN CONTROL Pain after surgery or related to activity is often due to strain/injury to muscle, tendon, nerves and/or incisions.  This pain is usually short-term and will improve in a few months.  To help speed the process of healing and to get back to regular activity more quickly, DO THE FOLLOWING THINGS TOGETHER: Increase activity gradually.  DO NOT PUSH THROUGH PAIN Use Ice and/or Heat Try Gentle Massage and/or Stretching Take over the counter pain medication Take Narcotic prescription pain medication for more severe pain  Good pain control = faster recovery.  It is better to take more medicine to be more active than to stay in bed all day to avoid medications.  Increase activity gradually Avoid heavy lifting at first, then increase to lifting as tolerated over the next 6 weeks. Do not "push through" the pain.  Listen to your body and avoid positions and maneuvers than reproduce the pain.  Wait a few days before trying something more intense Walking an hour a day is encouraged to help your body recover faster and more safely.  Start slowly and stop when getting sore.  If you can walk 30 minutes without stopping or pain, you can try more intense activity (running, jogging, aerobics, cycling, swimming, treadmill, sex, sports, weightlifting, etc.) Remember: If it hurts to do it, then don't do it! Use Ice and/or Heat You will have swelling and  bruising around the incisions.  This will take several weeks to resolve. Ice packs or heating pads (6-8 times a day, 30-60 minutes at a time) will help sooth soreness & bruising. Some people prefer to use ice alone, heat alone, or alternate between ice & heat.  Experiment and see what works best for you.  Consider trying ice for the first few days to help decrease swelling and bruising; then, switch to heat to help relax sore spots and speed recovery. Shower every day.  Short baths are fine.  It feels good!  Keep the incisions and wounds clean with soap & water.   Try Gentle Massage and/or Stretching Massage at the area of pain many times a day Stop if you feel pain - do not overdo it Take over the counter pain medication This helps the muscle and nerve tissues become less irritable and calm down faster Choose ONE of the following over-the-counter anti-inflammatory medications: Acetaminophen 500mg tabs (Tylenol) 1-2 pills with every meal and just before bedtime (avoid if you have liver problems or if you have   acetaminophen in you narcotic prescription) Naproxen 220mg tabs (ex. Aleve, Naprosyn) 1-2 pills twice a day (avoid if you have kidney, stomach, IBD, or bleeding problems) Ibuprofen 200mg tabs (ex. Advil, Motrin) 3-4 pills with every meal and just before bedtime (avoid if you have kidney, stomach, IBD, or bleeding problems) Take with food/snack several times a day as directed for at least 2 weeks to help keep pain / soreness down & more manageable. Take Narcotic prescription pain medication for more severe pain A prescription for strong pain control is often given to you upon discharge (for example: oxycodone/Percocet, hydrocodone/Norco/Vicodin, or tramadol/Ultram) Take your pain medication as prescribed. Be mindful that most narcotic prescriptions contain Tylenol (acetaminophen) as well - avoid taking too much Tylenol. If you are having problems/concerns with the prescription medicine (does  not control pain, nausea, vomiting, rash, itching, etc.), please call us [phone] to see if we need to switch you to a different pain medicine that will work better for you and/or control your side effects better. If you need a refill on your pain medication, you must call the office before 4 pm and on weekdays only.  By federal law, prescriptions for narcotics cannot be called into a pharmacy.  They must be filled out on paper & picked up from our office by the patient or authorized caretaker.  Prescriptions cannot be filled after 4 pm nor on weekends.    WHEN TO CALL US [phone] Severe uncontrolled or worsening pain  Fever over 101 F (38.5 C) Concerns with the incision: Worsening pain, redness, rash/hives, swelling, bleeding, or drainage Reactions / problems with new medications (itching, rash, hives, nausea, etc.) Nausea and/or vomiting Difficulty urinating Difficulty breathing Worsening fatigue, dizziness, lightheadedness, blurred vision Other concerns If you are not getting better after two weeks or are noticing you are getting worse, contact our office [phone] for further advice.  We may need to adjust your medications, re-evaluate you in the office, send you to the emergency room, or see what other things we can do to help. The clinic staff is available to answer your questions during regular business hours (8:30am-5pm).  Please don't hesitate to call and ask to speak to one of our nurses for clinical concerns.    A surgeon from [facility] is always on call at the hospitals 24 hours/day If you have a medical emergency, go to the nearest emergency room or call 911.  FOLLOW UP in our office One the day of your discharge from the hospital (or the next business weekday), please call [facility] to set up or confirm an appointment to see your surgeon in the office for a follow-up appointment.  Usually it is 2-3 weeks after your surgery.   If you  have skin staples at your incision(s), let the office know so we can set up a time in the office for the nurse to remove them (usually around 10 days after surgery). Make sure that you call for appointments the day of discharge (or the next business weekday) from the hospital to ensure a convenient appointment time. IF YOU HAVE DISABILITY OR FAMILY LEAVE FORMS, BRING THEM TO THE OFFICE FOR PROCESSING.  DO NOT GIVE THEM TO YOUR DOCTOR.  [facility] [address], [address], [place], [place]  [address] ? [phone] - Main [phone] - Toll Free,  [phone] - Fax [email or web address]    GETTING TO GOOD BOWEL HEALTH. It is expected for your digestive tract to   need a few months to get back to normal.  It is common for your bowel movements and stools to be irregular.  You will have occasional bloating and cramping that should eventually fade away.  Until you are eating solid food normally, off all pain medications, and back to regular activities; your bowels will not be normal.   Avoiding constipation The goal: ONE SOFT BOWEL MOVEMENT A DAY!    Drink plenty of fluids.  Choose water first. TAKE A FIBER SUPPLEMENT EVERY DAY THE REST OF YOUR LIFE During your first week back home, gradually add back a fiber supplement every day Experiment which form you can tolerate.   There are many forms such as powders, tablets, wafers, gummies, etc Psyllium bran (Metamucil), methylcellulose (Citrucel), Miralax or Glycolax, Benefiber, Flax Seed.  Adjust the dose week-by-week (1/2 dose/day to 6 doses a day) until you are moving your bowels 1-2 times a day.  Cut back the dose or try a different fiber product if it is giving you problems such as diarrhea or bloating. Sometimes a laxative is needed to help jump-start bowels if constipated until the fiber supplement can help regulate your bowels.  If you are tolerating eating & you are farting, it is okay to try a gentle  laxative such as double dose MiraLax, prune juice, or Milk of Magnesia.  Avoid using laxatives too often. Stool softeners can sometimes help counteract the constipating effects of narcotic pain medicines.  It can also cause diarrhea, so avoid using for too long. If you are still constipated despite taking fiber daily, eating solids, and a few doses of laxatives, call our office. Controlling diarrhea Try drinking liquids and eating bland foods for a few days to avoid stressing your intestines further. Avoid dairy products (especially milk & ice cream) for a short time.  The intestines often can lose the ability to digest lactose when stressed. Avoid foods that cause gassiness or bloating.  Typical foods include beans and other legumes, cabbage, broccoli, and dairy foods.  Avoid greasy, spicy, fast foods.  Every person has some sensitivity to other foods, so listen to your body and avoid those foods that trigger problems for you. Probiotics (such as active yogurt, Align, etc) may help repopulate the intestines and colon with normal bacteria and calm down a sensitive digestive tract Adding a fiber supplement gradually can help thicken stools by absorbing excess fluid and retrain the intestines to act more normally.  Slowly increase the dose over a few weeks.  Too much fiber too soon can backfire and cause cramping & bloating. It is okay to try and slow down diarrhea with a few doses of antidiarrheal medicines.   Bismuth subsalicylate (ex. Kayopectate, Pepto Bismol) for a few doses can help control diarrhea.  Avoid if pregnant.   Loperamide (Imodium) can slow down diarrhea.  Start with one tablet (2mg) first.  Avoid if you are having fevers or severe pain.  ILEOSTOMY PATIENTS WILL HAVE CHRONIC DIARRHEA since their colon is not in use.    Drink plenty of liquids.  You will need to drink even more glasses of water/liquid a day to avoid getting dehydrated. Record output from your ileostomy.  Expect to empty  the bag every 3-4 hours at first.  Most people with a permanent ileostomy empty their bag 4-6 times at the least.   Use antidiarrheal medicine (especially Imodium) several times a day to avoid getting dehydrated.  Start with a dose at bedtime & breakfast.  Adjust up or   down as needed.  Increase antidiarrheal medications as directed to avoid emptying the bag more than 8 times a day (every 3 hours). Work with your wound ostomy nurse to learn care for your ostomy.  See ostomy care instructions. TROUBLESHOOTING IRREGULAR BOWELS 1) Start with a soft & bland diet. No spicy, greasy, or fried foods.  2) Avoid gluten/wheat or dairy products from diet to see if symptoms improve. 3) Miralax 17gm or flax seed mixed in 8oz. water or juice-daily. May use 2-4 times a day as needed. 4) Gas-X, Phazyme, etc. as needed for gas & bloating.  5) Prilosec (omeprazole) over-the-counter as needed 6)  Consider probiotics (Align, Activa, etc) to help calm the bowels down  Call your doctor if you are getting worse or not getting better.  Sometimes further testing (cultures, endoscopy, X-ray studies, CT scans, bloodwork, etc.) may be needed to help diagnose and treat the cause of the diarrhea. [facility] [address], [address], , [place]  [address] [phone] - Main.    [phone]  - Toll Free.   [phone] - Fax [email or web address]   ###############################   #######################################################  Ostomy Support Information  You've heard that people get along just fine with only one of their eyes, or one of their lungs, or one of their kidneys. But you also know that you have only one intestine and only one bladder, and that leaves you feeling awfully empty, both physically and emotionally: You think no other people go around without part of their intestine with the ends of their intestines sticking out through their abdominal walls.    YOU ARE NOT ALONE.  There are nearly three quarters of a million people in the [place] who have an ostomy; people who have had surgery to remove all or part of their colons or bladders.   There is even a national association, the [employer] with over 350 local affiliated support groups that are organized by volunteers who provide peer support and counseling. [employer] has a toll free telephone num-ber, 800-826-0826 and an educational, interactive website, www.ostomy.org   An ostomy is an opening in the belly (abdominal wall) made by surgery. Ostomates are people who have had this procedure. The opening (stoma) allows the kidney or bowel to grdischarge waste. An external pouch covers the stoma to collect waste. Pouches are are a simple bag and are odor free. Different companies have disposable or reusable pouches to fit one's lifestyle. An ostomy can either be temporary or permanent.   THERE ARE THREE MAIN TYPES OF OSTOMIES Colostomy. A colostomy is a surgically created opening in the large intestine (colon). Ileostomy. An ileostomy is a surgically created opening in the small intestine. Urostomy. A urostomy is a surgically created opening to divert urine away from the bladder.  OSTOMY Care  The following guidelines will make care of your colostomy easier. Keep this information close by for quick reference.  Helpful DIET hints Eat a well-balanced diet including vegetables and fresh fruits. Eat on a regular schedule.  Drink at least 6 to 8 glasses of fluids daily. Eat slowly in a relaxed atmosphere. Chew your food thoroughly. Avoid chewing gum, smoking, and drinking from a straw. This will help decrease the amount of air you swallow, which may help reduce gas. Eating yogurt or drinking buttermilk may help reduce gas.  To control gas at night, do not eat after 8 p.m. This will give your bowel time to quiet down before you go   to bed.  If gas is a problem, you can purchase  Beano. Sprinkle Beano on the first bite of food before eating to reduce gas. It has no flavor and should not change the taste of your food. You can buy Beano over the counter at your local drugstore.  Foods like fish, onions, garlic, broccoli, asparagus, and cabbage produce odor. Although your pouch is odor-proof, if you eat these foods you may notice a stronger odor when emptying your pouch. If this is a concern, you may want to limit these foods in your diet.  If you have an ileostomy, you will have chronic diarrhea & need to drink more liquids to avoid getting dehydrated.  Consider antidiarrheal medicine like imodium (loperamide) or Lomotil to help slow down bowel movements / diarrhea into your ileostomy bag.  GETTING TO GOOD BOWEL HEALTH WITH AN ILEOSTOMY    With the colon bypassed & not in use, you will have small bowel diarrhea.   It is important to thicken & slow your bowel movements down.   The goal: 4-6 small BOWEL MOVEMENTS A DAY It is important to drink plenty of liquids to avoid getting dehydrated  CONTROLLING ILEOSTOMY DIARRHEA  TAKE A FIBER SUPPLEMENT (FiberCon or Benefiner soluble fiber) twice a day - to thicken stools by absorbing excess fluid and retrain the intestines to act more normally.  Slowly increase the dose over a few weeks.  Too much fiber too soon can backfire and cause cramping & bloating.  TAKE AN IRON SUPPLEMENT twice a day to naturally constipate your bowels.  Usually ferrous sulfate 325mg twice a day)  TAKE ANTI-DIARRHEAL MEDICINES: Loperamide (Imodium) can slow down diarrhea.  Start with two tablets (= 4mg) first and then try one tablet every 6 hours.  Can go up to 2 pills four times day (8 pills of 2mg max) Avoid if you are having fevers or severe pain.  If you are not better or start feeling worse, stop all medicines and call your doctor for advice LoMotil (Diphenoxylate / Atropine) is another medicine that can constipate & slow down bowel moevements Pepto  Bismol (bismuth) can gently thicken bowels as well  If diarrhea is worse,: drink plenty of liquids and try simpler foods for a few days to avoid stressing your intestines further. Avoid dairy products (especially milk & ice cream) for a short time.  The intestines often can lose the ability to digest lactose when stressed. Avoid foods that cause gassiness or bloating.  Typical foods include beans and other legumes, cabbage, broccoli, and dairy foods.  Every person has some sensitivity to other foods, so listen to our body and avoid those foods that trigger problems for you.Call your doctor if you are getting worse or not better.  Sometimes further testing (cultures, endoscopy, X-ray studies, bloodwork, etc) may be needed to help diagnose and treat the cause of the diarrhea. Take extra anti-diarrheal medicines (maximum is 8 pills of 2mg loperamide a day)   Tips for POUCHING an OSTOMY   Changing Your Pouch The best time to change your pouch is in the morning, before eating or drinking anything. Your stoma can function at any time, but it will function more after eating or drinking.   Applying the pouching system  Place all your equipment close at hand before removing your pouch.  Wash your hands.  Stand or sit in front of a mirror. Use the position that works best for you. Remember that you must keep the skin around the stoma   wrinkle-free for a good seal.  Gently remove the used pouch (1-piece system) or the pouch and old wafer (2-piece system). Empty the pouch into the toilet. Save the closure clip to use again.  Wash the stoma itself and the skin around the stoma. Your stoma may bleed a little when being washed. This is normal. Rinse and pat dry. You may use a wash cloth or soft paper towels (like Bounty), mild soap (like Dial, Safeguard, or Ivory), and water. Avoid soaps that contain perfumes or lotions.  For a new pouch (1-piece system) or a new wafer (2-piece system), measure your  stoma using the stoma guide in each box of supplies.  Trace the shape of your stoma onto the back of the new pouch or the back of the new wafer. Cut out the opening. Remove the paper backing and set it aside.  Optional: Apply a skin barrier powder to surrounding skin if it is irritated (bare or weeping), and dust off the excess. Optional: Apply a skin-prep wipe (such as Skin Prep or All-Kare) to the skin around the stoma, and let it dry. Do not apply this solution if the skin is irritated (red, tender, or broken) or if you have shaved around the stoma. Optional: Apply a skin barrier paste (such as Stomahesive, Coloplast, or Premium) around the opening cut in the back of the pouch or wafer. Allow it to dry for 30 to 60 seconds.  Hold the pouch (1-piece system) or wafer (2-piece system) with the sticky side toward your body. Make sure the skin around the stoma is wrinkle-free. Center the opening on the stoma, then press firmly to your abdomen (Fig. 4). Look in the mirror to check if you are placing the pouch, or wafer, in the right position. For a 2-piece system, snap the pouch onto the wafer. Make sure it snaps into place securely.  Place your hand over the stoma and the pouch or wafer for about 30 seconds. The heat from your hand can help the pouch or wafer stick to your skin.  Add deodorant (such as Super Banish or Nullo) to your pouch. Other options include food extracts such as vanilla oil and peppermint extract. Add about 10 drops of the deodorant to the pouch. Then apply the closure clamp. Note: Do not use toxic  chemicals or commercial cleaning agents in your pouch. These substances may harm the stoma.  Optional: For extra seal, apply tape to all 4 sides around the pouch or wafer, as if you were framing a picture. You may use any brand of medical adhesive tape. Change your pouch every 5 to 7 days. Change it immediately if a leak occurs.  Wash your hands afterwards.  If you are wearing a  2-piece system, you may use 2 new pouches per week and alternate them. Rinse the pouch with mild soap and warm water and hang it to dry for the next day. Apply the fresh pouch. Alternate the 2 pouches like this for a week. After a week, change the wafer and begin with 2 new pouches. Place the old pouches in a plastic bag, and put them in the trash.   LIVING WITH AN OSTOMY  Emptying Your Pouch Empty your pouch when it is one-third full (of urine, stool, and/or gas). If you wait until your pouch is fuller than this, it will be more difficult to empty and more noticeable. When you empty your pouch, either put toilet paper in the toilet bowl first, or flush the   toilet while you empty the pouch. This will reduce splashing. You can empty the pouch between your legs or to one side while sitting, or while standing or stooping. If you have a 2-piece system, you can snap off the pouch to empty it. Remember that your stoma may function during this time. If you wish to rinse your pouch after you empty it, a turkey baster can be helpful. When using a baster, squirt water up into the pouch through the opening at the bottom. With a 2-piece system, you can snap off the pouch to rinse it. After rinsing  your pouch, empty it into the toilet. When rinsing your pouch at home, put a few granules of Dreft soap in the rinse water. This helps lubricate and freshen your pouch. The inside of your pouch can be sprayed with non-stick cooking oil (Pam spray). This may help reduce stool sticking to the inside of the pouch.  Bathing You may shower or bathe with your pouch on or off. Remember that your stoma may function during this time.  The materials you use to wash your stoma and the skin around it should be clean, but they do not need to be sterile.  Wearing Your Pouch During hot weather, or if you perspire a lot in general, wear a cover over your pouch. This may prevent a rash on your skin under the pouch. Pouch covers are  sold at ostomy supply stores. Wear the pouch inside your underwear for better support. Watch your weight. Any gain or loss of 10 to 15 pounds or more can change the way your pouch fits.  Going Away From Home A collapsible cup (like those that come in travel kits) or a soft plastic squirt bottle with a pull-up top (like a travel bottle for shampoo) can be used for rinsing your pouch when you are away from home. Tilt the opening of the pouch at an upward angle when using a cup to rinse.  Carry wet wipes or extra tissues to use in public bathrooms.  Carry an extra pouching system with you at all times.  Never keep ostomy supplies in the glove compartment of your car. Extreme heat or cold can damage the skin barriers and adhesive wafers on the pouch.  When you travel, carry your ostomy supplies with you at all times. Keep them within easy reach. Do not pack ostomy supplies in baggage that will be checked or otherwise separated from you, because your baggage might be lost. If you're traveling out of the country, it is helpful to have a letter stating that you are carrying ostomy supplies as a medical necessity.  If you need ostomy supplies while traveling, look in the yellow pages of the telephone book under "Surgical Supplies." Or call the local ostomy organization to find out where supplies are available.  Do not let your ostomy supplies get low. Always order new pouches before you use the last one.  Reducing Odor Limit foods such as broccoli, cabbage, onions, fish, and garlic in your diet to help reduce odor. Each time you empty your pouch, carefully clean the opening of the pouch, both inside and outside, with toilet paper. Rinse your pouch 1 or 2 times daily after you empty it (see directions for emptying your pouch and going away from home). Add deodorant (such as Super Banish or Nullo) to your pouch. Use air deodorizers in your bathroom. Do not add aspirin to your pouch. Even though  aspirin can help prevent odor, it   could cause ulcers on your stoma.  When to call the doctor Call the doctor if you have any of the following symptoms: Purple, black, or white stoma Severe cramps lasting more than 6 hours Severe watery discharge from the stoma lasting more than 6 hours No output from the colostomy for 3 days Excessive bleeding from your stoma Swelling of your stoma to more than 1/2-inch larger than usual Pulling inward of your stoma below skin level Severe skin irritation or deep ulcers Bulging or other changes in your abdomen  When to call your ostomy nurse Call your ostomy/enterostomal therapy (WOCN) nurse if any of the following occurs: Frequent leaking of your pouching system Change in size or appearance of your stoma, causing discomfort or problems with your pouch Skin rash or rawness Weight gain or loss that causes problems with your pouch     FREQUENTLY ASKED QUESTIONS   Why haven't you met any of these folks who have an ostomy?  Well, maybe you have! You just did not recognize them because an ostomy doesn't show. It can be kept secret if you wish. Why, maybe some of your best friends, office associates or neighbors have an ostomy ... you never can tell. People facing ostomy surgery have many quality-of-life questions like: Will you bulge? Smell? Make noises? Will you feel waste leaving your body? Will you be a captive of the toilet? Will you starve? Be a social outcast? Get/stay married? Have babies? Easily bathe, go swimming, bend over?  OK, let's look at what you can expect:   Will you bulge?  Remember, without part of the intestine or bladder, and its contents, you should have a flatter tummy than before. You can expect to wear, with little exception, what you wore before surgery ... and this in-cludes tight clothing and bathing suits.   Will you smell?  Today, thanks to modern odor proof pouching systems, you can walk into an ostomy support group  meeting and not smell anything that is foul or offensive. And, for those with an ileostomy or colostomy who are concerned about odor when emptying their pouch, there are in-pouch deodorants that can be used to eliminate any waste odors that may exist.   Will you make noises?  Everyone produces gas, especially if they are an air-swallower. But intestinal sounds that occur from time to time are no differ-ent than a gurgling tummy, and quite often your clothing will muffle any sounds.   Will you feel the waste discharges?  For those with a colostomy or ileostomy there might be a slight pressure when waste leaves your body, but understand that the intestines have no nerve endings, so there will be no unpleasant sensations. Those with a urostomy will probably be unaware of any kidney drainage.   Will you be a captive of the toilet?  Immediately post-op you will spend more time in the bathroom than you will after your body recovers from surgery. Every person is different, but on average those with an ileostomy or urostomy may empty their pouches 4 to 6 times a day; a little  less if you have a colostomy. The average wear time between pouch system changes is 3 to 5 days and the changing process should take less than 30 minutes.   Will I need to be on a special diet? Most people return to their normal diet when they have recovered from surgery. Be sure to chew your food well, eat a well-balanced diet and drink plenty of fluids. If   you experience problems with a certain food, wait a couple of weeks and try it again.  Will there be odor and noises? Pouching systems are designed to be odor-proof or odor-resistant. There are deodorants that can be used in the pouch. Medications are also available to help reduce odor. Limit gas-producing foods and carbonated beverages. You will experience less gas and fewer noises as you heal from surgery.  How much time will it take to care for my ostomy? At first, you may  spend a lot of time learning about your ostomy and how to take care of it. As you become more comfortable and skilled at changing the pouching system, it will take very little time to care for it.   Will I be able to return to work? People with ostomies can perform most jobs. As soon as you have healed from surgery, you should be able to return to work. Heavy lifting (more than 10 pounds) may be discouraged.   What about intimacy? Sexual relationships and intimacy are important and fulfilling aspects of your life. They should continue after ostomy surgery. Intimacy-related concerns should be discussed openly between you and your partner.   Can I wear regular clothing? You do not need to wear special clothing. Ostomy pouches are fairly flat and barely noticeable. Elastic undergarments will not hurt the stoma or prevent the ostomy from functioning.   Can I participate in sports? An ostomy should not limit your involvement in sports. Many people with ostomies are runners, skiers, swimmers or participate in other active lifestyles. Talk with your caregiver first before doing heavy physical activity.  Will you starve?  Not if you follow doctor's orders at each stage of your post-op adjustment. There is no such thing as an "ostomy diet". Some people with an ostomy will be able to eat and tolerate anything; others may find diffi-culty with some foods. Each person is an individual and must determine, by trial, what is best for them. A good practice for all is to drink plenty of water.   Will you be a social outcast?  Have you met anyone who has an ostomy and is a social outcast? Why should you be the first? Only your attitude and self image will effect how you are treated. No confi-dent person is an outcast.    PROFESSIONAL HELP   Resources are available if you need help or have questions about your ostomy.   Specially trained nurses called Wound, Ostomy Continence Nurses (WOCN) are available for  consultation in most major medical centers.  Consider getting an ostomy consult at an outpatient ostomy clinic.   [facility] has an Ostomy Clinic run by an WOCN ostomy nurse at the [facility] campus.  [phone]. [facility] can help set up an appointment   The United Ostomy Association (UOA) is a group made up of many local chapters throughout the [place]. These local groups hold meetings and provide support to prospective and existing ostomates. They sponsor educational events and have qualified visitors to make personal or telephone visits. Contact the UOA for the chapter nearest you and for other educational publications.  More detailed information can be found in Colostomy Guide, a publication of the United Ostomy Association (UOA). Contact UOA at 1-800-826-0826 or visit their web site at www.uoaa.org. The website contains links to other sites, suppliers and resources.  [facility] Secure Start Services: Start at the website to enlist for support.  Your Wound Ostomy (WOCN) nurse may have started this   process. [email or web address]://[email or web address].[facility].com/en/securestart Secure Start services are designed to support people as they live their lives with an ostomy or neurogenic bladder. Enrolling is easy and at no cost to the patient. We realize that each person's needs and life journey are different. Through Secure Start services, we want to help people live their life, their way.  #######################################################  

## 2021-08-18 NOTE — TOC Progression Note (Signed)
Transition of Care [facility]) - Progression Note    Patient Details  Name: [name] MRN: [id number] Date of Birth: 09-13-1941  [facility] of [facility]) CM/SW Contact  Lennart Pall, LCSW Phone Number: 08/18/2021, 3:06 PM  Clinical Narrative:    Met with pt to discuss recommendation for HHPT.  She is agreeable and requests [facility].  Referral placed with [facility].  Additional recs for a rolling walker, however, pt says she may have one at home already and is getting family to check.  Will ask TOC coverage tomorrow to check with her and see if this needs to be ordered.     Barriers to Discharge: Continued Medical Work up  Expected Discharge Plan and Services                                     HH Arranged: PT [facility] Agency: [place] ([facility]) Date HH Agency Contacted: 08/18/21 Time [place]: [phone] Representative spoke with at [place]: [place] ([facility]) Interventions    Readmission Risk Interventions No flowsheet data found.

## 2021-08-18 NOTE — Progress Notes (Signed)
3 Days Post-Op   Subjective/Chief Complaint: No complaints, no oxygen, says she did get in chair yesterday, having bms, tol diet   Objective: Vital signs in last 24 hours: Temp:  [97.7 F (36.5 C)-99.6 F (37.6 C)] 97.7 F (36.5 C) (10/01 0542) Pulse Rate:  [74-103] 80 (10/01 0542) Resp:  [16-20] 20 (10/01 0542) BP: (119-137)/(44-88) 137/44 (10/01 0542) SpO2:  [91 %-96 %] 95 % (10/01 0542) Last BM Date: 08/18/21  Intake/Output from previous day: 09/30 0701 - 10/01 0700 In: 360 [P.O.:360] Out: 0  Intake/Output this shift: No intake/output data recorded.  General appearance: no distress Resp: clear to auscultation bilaterally Cardio: regular rate and rhythm GI: mild distended incision without infection or drainage, bs present, approp tender  Lab Results:  Recent Labs    08/16/21 0519 08/17/21 0428  WBC 10.7* 10.8*  HGB 9.8* 9.6*  HCT 28.4* 29.1*  PLT 210 207   BMET Recent Labs    08/17/21 0428 08/18/21 0452  NA 135 135  K 4.1 4.7  CL 104 105  CO2 25 26  GLUCOSE 113* 117*  BUN 11 17  CREATININE 0.76 0.70  CALCIUM 8.0* 8.1*   PT/INR No results for input(s): LABPROT, INR in the last 72 hours. ABG No results for input(s): PHART, HCO3 in the last 72 hours.  Invalid input(s): PCO2, PO2  Studies/Results: DG Chest Port 1 View  Result Date: 08/18/2021 CLINICAL DATA:  Hypoxia. EXAM: PORTABLE CHEST 1 VIEW COMPARISON:  Apr 01, 2018 FINDINGS: New opacity in the right base. The heart, hila, mediastinum, lungs, and pleura are otherwise unremarkable. IMPRESSION: New opacity in the right base could represent atelectasis or developing infiltrate. Recommend clinical correlation and attention on follow-up. Electronically Signed   By: Dorise Bullion III M.D.   On: 08/18/2021 07:01    Anti-infectives: Anti-infectives (From admission, onward)    Start     Dose/Rate Route Frequency Ordered Stop   08/16/21 0100  cefoTEtan (CEFOTAN) 2 g in sodium chloride 0.9 % 100 mL IVPB         2 g 200 mL/hr over 30 Minutes Intravenous Every 12 hours 08/15/21 1729 08/16/21 0156   08/15/21 2200  acyclovir (ZOVIRAX) tablet 400 mg        400 mg Oral Daily at bedtime 08/15/21 1729     08/15/21 1100  cefoTEtan (CEFOTAN) 2 g in sodium chloride 0.9 % 100 mL IVPB        2 g 200 mL/hr over 30 Minutes Intravenous On call to O.R. 08/15/21 1057 08/15/21 1320       Assessment/Plan: POD 3 right colectomy -regular diet -oob, PT, pulmonary toilet -TOC for HHPT rec -wean oxygen to room air -restart eliquis today -Bmet normal today -home when off oxygen and HH setup -continue home meds   Rolm Bookbinder 08/18/2021

## 2021-08-18 NOTE — Progress Notes (Signed)
Kaitlyn Hoover  Kaitlyn Hoover  KXF:[id number] DOB: 1940-11-29 DOA: 08/15/2021 PCP: [name], MD Requesting physician: [name] Date of consultation: 08/16/21 Reason for consultation: Medical Management  Subjective: Having shortness of breath on exertion with increased nonproductive cough. No chest pain. +abdominal pain when coughing. Tmax 99.64F. No chills.   Objective: BP (!) 137/44 (BP Location: Left Arm)   Pulse 80   Temp 97.7 F (36.5 C)   Resp 20   Ht 5\' 2"  (1.575 m)   Wt 66.6 kg   LMP  (LMP Unknown)   SpO2 96%   BMI 26.85 kg/m   Gen: 80 y.o. female in no distress Pulm: Nonlabored but tachypneic with coarse RLL sounds, no wheezes CV: Regular rate and rhythm. No murmur, rub, or gallop. No JVD, no dependent edema. GI: Abdomen soft, mildly distended with appropriate, stable tenderness. Lap incision sites without erythema, induration or discharge. Normoactive bowel sounds.  Ext: Warm, no deformities Skin: No rashes, lesions or ulcers on visualized skin elsewhere. Neuro: Alert and oriented. No focal neurological deficits. Psych: Judgement and insight appear fair. Mood euthymic & affect congruent. Behavior is appropriate.    Assessment & Plan: Acute hypoxic respiratory failure due to RLL pneumonia: Atelectasis also likely contributing. +RLL infiltrate on my review of CXR this AM. With leukocytosis and positive PCT, suspicious for pneumonia. - Start augmentin x5 days - Encourage frequent use of incentive spirometry.  - Incentive spirometry - Wean oxygen as tolerated. If able to wean, would be candidate to complete augmentin at discharge.  Paroxysmal atrial fibrillation: Followed by Dr. 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 RRR currently. - Continue home diltiazem. - Restart eliquis 10/1 per surgery. CHA2DS2-VASc score is 5.    HTN:  - Hold chlorthalidone. Has a long half life.  Hypokalemia: Improved.  - Monitor. Stop  supplementation.  Hyponatremia: Improved.   Anxiety, depression, schizoaffective disorder, insomnia: Chronic and quiescent. - Continue home ziprasidone, iloperidone, lamictal, temazepam. Pt also on donepezil though no mention of dementia in her records. No objection to continuing it here.   Hyperglycemia: Mild. HbA1c 5%  - Monitor on metabolic panel. Fasting glucose today 113mg /dl.  HLD:  - Can hold statin acutely, but ok to restart at discharge    Hypothyroidism:  - Continue stable dose synthroid.   Invasive adenocarcinoma of the colon, s/p robot-assisted laparoscopic colectomy by Dr. Marcello Moores 9/28. - Plans per primary team.   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 - All the more important to emphasize early and frequent mobility.  Leukocytosis: Without fever. Most likely reactive postoperatively.  - Monitor off abx for now  Kaitlyn Pour, MD [facility] [email or web address] 08/18/2021, 1:30 PM

## 2021-08-18 NOTE — Plan of Care (Signed)
Plan of care reviewed and discussed with the patient. 

## 2021-08-19 ENCOUNTER — Inpatient Hospital Stay (HOSPITAL_COMMUNITY): Payer: PPO

## 2021-08-19 LAB — BASIC METABOLIC PANEL
Anion gap: 7 (ref 5–15)
BUN: 20 mg/dL (ref 8–23)
CO2: 26 mmol/L (ref 22–32)
Calcium: 8.1 mg/dL — ABNORMAL LOW (ref 8.9–10.3)
Chloride: 103 mmol/L (ref 98–111)
Creatinine, Ser: 0.73 mg/dL (ref 0.44–1.00)
GFR, Estimated: >60 mL/min (ref >60)
Glucose, Bld: 117 mg/dL — ABNORMAL HIGH (ref 70–99)
Potassium: 4.2 mmol/L (ref 3.5–5.1)
Sodium: 136 mmol/L (ref 135–145)

## 2021-08-19 LAB — PROCALCITONIN: Procalcitonin: 0.33 ng/mL

## 2021-08-19 MED ORDER — SODIUM CHLORIDE 0.9 % IV SOLN: INTRAVENOUS | Status: DC

## 2021-08-19 MED ORDER — PANTOPRAZOLE SODIUM 40 MG IV SOLR
40.0000 mg | INTRAVENOUS | Status: DC
  Administered 2021-08-20 – 2021-08-21 (×2): 40 mg via INTRAVENOUS
  Filled 2021-08-19 (×2): qty 40

## 2021-08-19 MED ORDER — SODIUM CHLORIDE 0.9 % IV SOLN
3.0000 g | Freq: Three times a day (TID) | INTRAVENOUS | Status: DC
  Administered 2021-08-19 – 2021-08-22 (×9): 3 g via INTRAVENOUS
  Filled 2021-08-19 (×10): qty 8

## 2021-08-19 NOTE — Progress Notes (Signed)
Physical Therapy Treatment Patient Details Name: Kaitlyn Hoover MRN: [id number] DOB: [birth year] Today's Date: 08/19/2021   History of Present Illness Patient is 80 y.o. female s/p robotic right colectomy on 9/28 due to colon mass. 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 PMH significant for anxiety, OA, Afib, bipolar disorder, GERD, HTN, hypothyroidism, post-polio syndrome with Rt sided weakness.    PT Comments    Pt with more complaints today of nausea than pain.  She was able to increase her ambulation distance, but still feels her legs are weak.  Continue to recommend HHPT.   Recommendations for follow up therapy are one component of a multi-disciplinary discharge planning process, led by the attending physician.  Recommendations may be updated based on patient status, additional functional criteria and insurance authorization.  Follow Up Recommendations  Home health PT;Supervision/Assistance - 24 hour     Equipment Recommendations  None recommended by PT (her family found her RW at home and doesn't need one at time of d/c)    Recommendations for Other Services       Precautions / Restrictions Precautions Precautions: Fall     Mobility  Bed Mobility Overal bed mobility: Needs Assistance Bed Mobility: Sit to Supine       Sit to supine: Min guard        Transfers                    Ambulation/Gait Ambulation/Gait assistance: Min guard Gait Distance (Feet): 180 Feet Assistive device: Rolling walker (2 wheeled) Gait Pattern/deviations: Step-through pattern Gait velocity: slightly decreased   General Gait Details: limp from polio, reports feeling with gait   Stairs             Wheelchair Mobility    Modified Rankin (Stroke Patients Only)       Balance           Standing balance support: During functional activity;Bilateral upper extremity supported Standing  balance-Leahy Scale: Poor                              Cognition Arousal/Alertness: Awake/alert Behavior During Therapy: WFL for tasks assessed/performed Overall Cognitive Status: Within Functional Limits for tasks assessed                                        Exercises      General Comments        Pertinent Vitals/Pain Pain Assessment: No/denies pain    Home Living                      Prior Function            PT Goals (current goals can now be found in the care plan section) Acute Rehab PT Goals Patient Stated Goal: get home and regain independence and strength PT Goal Formulation: With patient Time For Goal Achievement: 08/31/21 Potential to Achieve Goals: Good Progress towards PT goals: Progressing toward goals    Frequency    Min 3X/week      PT Plan Current plan remains appropriate    Co-evaluation              AM-PAC PT "6 Clicks" Mobility   Outcome Measure  Help needed turning from your back to your side  while in a flat bed without using bedrails?: A Little Help needed moving from lying on your back to sitting on the side of a flat bed without using bedrails?: A Little Help needed moving to and from a bed to a chair (including a wheelchair)?: A Little Help needed standing up from a chair using your arms (e.g., wheelchair or bedside chair)?: A Little Help needed to walk in hospital room?: A Little Help needed climbing 3-5 steps with a railing? : A Little 6 Click Score: 18    End of Session   Activity Tolerance: Patient tolerated treatment well Patient left: in bed;with call bell/phone within reach Nurse Communication: Mobility status PT Visit Diagnosis: Muscle weakness (generalized) (M62.81);Difficulty in walking, not elsewhere classified (R26.2);Unsteadiness on feet (R26.81)     Time: 4847-2072 PT Time Calculation (min) (ACUTE ONLY): 10 min  Charges:  $Gait Training: 8-22 mins                      [name], [place]  08/19/2021    [name] 08/19/2021, 4:39 PM

## 2021-08-19 NOTE — Progress Notes (Signed)
Pharmacy Antibiotic Note  Kaitlyn Hoover is a 80 y.o. female admitted on 08/15/2021 for R colectomy, new dx Colon Ca. Treating for PNA post-op with Augmentin, now with ileus.  Pharmacy has been consulted for Unasyn dosing.  Plan: Unasyn 3gm q8  Height: 5\' 2"  (157.5 cm) Weight: 66.6 kg (146 lb 13.2 oz) IBW/kg (Calculated) : 50.1  Temp (24hrs), Avg:98.8 F (37.1 C), Min:98.4 F (36.9 C), Max:98.9 F (37.2 C)  Recent Labs  Lab 08/16/21 0519 08/16/21 1904 08/17/21 0428 08/18/21 0452 08/19/21 0442  WBC 10.7*  --  10.8*  --   --   CREATININE 0.76 0.79 0.76 0.70 0.73    Estimated Creatinine Clearance: 51 mL/min (by C-G formula based on SCr of 0.73 mg/dL).    Allergies  Allergen Reactions   Sulfa Antibiotics Rash and Other (See Comments)    Childhood allergy   Ciprocinonide [Fluocinolone] Rash   Ciprofloxacin Rash    Antimicrobials this admission: - acyclovir 10/1 Augmentin >> 10/2 10/2 Unasyn >>  Dose adjustments this admission:  Microbiology results: No cx  Thank you for allowing pharmacy to be a part of this patient's care.  Minda Ditto PharmD 08/19/2021 11:05 AM

## 2021-08-19 NOTE — Plan of Care (Signed)
  Problem: Education: Goal: Knowledge of General Education information will improve Description: Including pain rating scale, medication(s)/side effects and non-pharmacologic comfort measures Outcome: Progressing   Problem: Pain Managment: Goal: General experience of comfort will improve Outcome: Progressing   

## 2021-08-19 NOTE — Progress Notes (Signed)
4 Days Post-Op   Subjective/Chief Complaint: Has nausea, emesis, complains of heartburn Some loose stool, feels bloated, oxygen off, not taking po   Objective: Vital signs in last 24 hours: Temp:  [98.4 F (36.9 C)-98.9 F (37.2 C)] 98.4 F (36.9 C) (10/02 0456) Pulse Rate:  [77-87] 87 (10/02 0456) Resp:  [18-20] 20 (10/02 0456) BP: (121-136)/(36-50) 121/45 (10/02 0456) SpO2:  [92 %-96 %] 92 % (10/02 0456) Last BM Date: 08/20/21 (diarrhea)  Intake/Output from previous day: 10/01 0701 - 10/02 0700 In: 420 [P.O.:420] Out: 250 [Urine:200; Emesis/NG output:50] Intake/Output this shift: No intake/output data recorded.  General appearance: no distress Resp: clear to auscultation bilaterally Cardio: regular rate and rhythm GI: incision without infection, much more distended today, minimally tender, few bs    Lab Results:  Recent Labs    08/17/21 0428  WBC 10.8*  HGB 9.6*  HCT 29.1*  PLT 207   BMET Recent Labs    08/18/21 0452 08/19/21 0442  NA 135 136  K 4.7 4.2  CL 105 103  CO2 26 26  GLUCOSE 117* 117*  BUN 17 20  CREATININE 0.70 0.73  CALCIUM 8.1* 8.1*   PT/INR No results for input(s): LABPROT, INR in the last 72 hours. ABG No results for input(s): PHART, HCO3 in the last 72 hours.  Invalid input(s): PCO2, PO2  Studies/Results: DG Chest Port 1 View  Result Date: 08/18/2021 CLINICAL DATA:  Hypoxia. EXAM: PORTABLE CHEST 1 VIEW COMPARISON:  Apr 01, 2018 FINDINGS: New opacity in the right base. The heart, hila, mediastinum, lungs, and pleura are otherwise unremarkable. IMPRESSION: New opacity in the right base could represent atelectasis or developing infiltrate. Recommend clinical correlation and attention on follow-up. Electronically Signed   By: Dorise Bullion III M.D.   On: 08/18/2021 07:01    Anti-infectives: Anti-infectives (From admission, onward)    Start     Dose/Rate Route Frequency Ordered Stop   08/18/21 1400  amoxicillin-clavulanate  (AUGMENTIN) 875-125 MG per tablet 1 tablet        1 tablet Oral Every 12 hours 08/18/21 1227 08/23/21 0959   08/18/21 1315  amoxicillin-clavulanate (AUGMENTIN) 875-125 MG per tablet 1 tablet  Status:  Discontinued        1 tablet Oral Every 12 hours 08/18/21 1227 08/18/21 1227   08/16/21 0100  cefoTEtan (CEFOTAN) 2 g in sodium chloride 0.9 % 100 mL IVPB        2 g 200 mL/hr over 30 Minutes Intravenous Every 12 hours 08/15/21 1729 08/16/21 0156   08/15/21 2200  acyclovir (ZOVIRAX) tablet 400 mg        400 mg Oral Daily at bedtime 08/15/21 1729     08/15/21 1100  cefoTEtan (CEFOTAN) 2 g in sodium chloride 0.9 % 100 mL IVPB        2 g 200 mL/hr over 30 Minutes Intravenous On call to O.R. 08/15/21 1057 08/15/21 1320       Assessment/Plan: POD 4 right colectomy -appears to have ileus, will check film, may need ng tube, npo except ice chips and sips with meds, will restart iv fludis -oob, PT, pulmonary toilet -TOC for HHPT rec -appreciate TRH seeing- being treated for pna- might be ideal to convert to iv abx now due to ileus -lovenox, scds    Rolm Bookbinder 08/19/2021

## 2021-08-19 NOTE — Progress Notes (Signed)
Kaitlyn Hoover  Kaitlyn Hoover  KPT:[id number] DOB: May 13, 1941 DOA: 08/15/2021 PCP: [name], MD Requesting physician: [name] Date of consultation: 08/16/21 Reason for consultation: Medical Management  Subjective: No longer on oxygen, denies shortness of breath or cough. Main concern is developing abdominal pain/heartburn, nausea and vomiting.   Objective: BP (!) 131/55 (BP Location: Left Arm)   Pulse 81   Temp 98.1 F (36.7 C) (Oral)   Resp 20   Ht 5\' 2"  (1.575 m)   Wt 66.6 kg   LMP  (LMP Unknown)   SpO2 93%   BMI 26.85 kg/m   Gen: 80 y.o. female in no distress Pulm: Nonlabored breathing room air. Clear. CV: Regular rate and rhythm. No murmur, rub, or gallop. No JVD, no dependent edema. GI: Abdomen increasingly distended with very mild diffuse tenderness without rebound or guarding, hypoactive BS.  Ext: Warm, no deformities Skin: No new rashes, lesions or ulcers on visualized skin. Incisions without erythema/induration/discharge. Neuro: Alert and oriented. No focal neurological deficits. Psych: Judgement and insight appear fair. Mood euthymic & affect congruent. Behavior is appropriate.    Assessment & Plan: Acute hypoxic respiratory failure due to RLL pneumonia: Atelectasis also likely contributing. +RLL infiltrate on my review of CXR this AM. With leukocytosis and positive PCT, suspicious for pneumonia. - No longer requiring supplemental oxygen. - Change augmentin to unasyn given NPO status, still plan 5 days.  - Encourage frequent use of incentive spirometry.  - Incentive spirometry  Paroxysmal atrial fibrillation: Followed by Dr. 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 RRR currently. - Continue home diltiazem. Received this morning. If continues strict NPO tomorrow, would need to switch to diltiazem infusion.  - Restarted eliquis 10/1 per surgery. CHA2DS2-VASc score is 5. If having emesis or strict NPO, would switch to  lovenox.    HTN:  - Holding chlorthalidone.   Postoperative ileus: Symptoms consistent with dilated small bowel 3.6-3.8cm on my review. No free air and no peritonitis on exam. - Mgmt per surgery.   Hypokalemia: Improved.  - Monitor.  Hyponatremia: Improved.   Anxiety, depression, schizoaffective disorder, insomnia: Chronic and quiescent. - Continue home ziprasidone, iloperidone, lamictal, temazepam. Pt also on donepezil though no mention of dementia in her records. No objection to continuing it here. These do not all have easy IV equivalents.    Hyperglycemia: Mild. HbA1c 5%  - Monitor on metabolic panel.   HLD:  - Can hold statin acutely, but ok to restart at discharge    Hypothyroidism:  - Continue stable dose synthroid. Can change to 1/2 dose by IV if needed.   Invasive adenocarcinoma of the colon, s/p robot-assisted laparoscopic colectomy by Dr. Marcello Moores 9/28. - Plans per primary team.   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 - All the more important to emphasize early and frequent mobility.  [name], MD [facility] [email or web address] 08/19/2021, 3:31 PM

## 2021-08-20 LAB — SURGICAL PATHOLOGY

## 2021-08-20 MED ORDER — POLYETHYLENE GLYCOL 3350 17 G PO PACK: 17.0000 g | PACK | Freq: Every day | ORAL | Status: DC | PRN

## 2021-08-20 MED ORDER — APIXABAN 5 MG PO TABS
5.0000 mg | ORAL_TABLET | Freq: Two times a day (BID) | ORAL | Status: DC
  Administered 2021-08-20 – 2021-08-23 (×6): 5 mg via ORAL
  Filled 2021-08-20 (×6): qty 1

## 2021-08-20 NOTE — Progress Notes (Signed)
5 Days Post-Op Robotic Right Colectomy Subjective: Feels better today.  Denies nausea, had a small BM, ambulated in hall yesterday  Objective: Vital signs in last 24 hours: Temp:  [98 F (36.7 C)-99.1 F (37.3 C)] 98 F (36.7 C) (10/03 0504) Pulse Rate:  [81-85] 84 (10/03 0504) Resp:  [18-20] 18 (10/03 0504) BP: (131-147)/(45-57) 139/45 (10/03 0504) SpO2:  [90 %-93 %] 90 % (10/03 0504)   Intake/Output from previous day: 10/02 0701 - 10/03 0700 In: 1996.3 [P.O.:170; I.V.:1526.3; IV Piggyback:300] Out: -  Intake/Output this shift: No intake/output data recorded.   General appearance: alert and cooperative GI: soft, mild distention   Incision: no significant drainage  Lab Results:  No results for input(s): WBC, HGB, HCT, PLT in the last 72 hours.  BMET Recent Labs    08/18/21 0452 08/19/21 0442  NA 135 136  K 4.7 4.2  CL 105 103  CO2 26 26  GLUCOSE 117* 117*  BUN 17 20  CREATININE 0.70 0.73  CALCIUM 8.1* 8.1*    PT/INR No results for input(s): LABPROT, INR in the last 72 hours. ABG No results for input(s): PHART, HCO3 in the last 72 hours.  Invalid input(s): PCO2, PO2  MEDS, Scheduled  acetaminophen  1,000 mg Oral Q6H   acyclovir  400 mg Oral QHS   apixaban  5 mg Oral Daily   Chlorhexidine Gluconate Cloth  6 each Topical Daily   diltiazem  240 mg Oral Daily   donepezil  10 mg Oral QHS   fluticasone  2 spray Each Nare Daily   Iloperidone  1 mg Oral QHS   lamoTRIgine  100 mg Oral Daily   levothyroxine  100 mcg Oral Q0600   melatonin  3 mg Oral QHS   oxybutynin  15 mg Oral Daily   pantoprazole (PROTONIX) IV  40 mg Intravenous Q24H   polyethylene glycol  17 g Oral Daily   saccharomyces boulardii  250 mg Oral BID   temazepam  15 mg Oral QHS   ziprasidone  40 mg Oral QHS    Studies/Results: DG Abd Portable 1V  Result Date: 08/19/2021 CLINICAL DATA:  Abdominal distension.  Colectomy August 15, 2021. EXAM: PORTABLE ABDOMEN - 1 VIEW COMPARISON:   None. FINDINGS: Dilated loops of small bowel are identified. No significant colonic air. No other acute abnormalities identified. IMPRESSION: Dilated small bowel may be due to ileus given recent surgery 4 days ago. Small bowel obstruction could have a similar appearance but is less likely given history. Recommend attention on follow-up. Electronically Signed   By: Dorise Bullion III M.D.   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   Schizoaffective disorder ([place]) 09/15/2018   Atrial fibrillation ([place]) 04/25/2017   Hyperlipidemia 03/11/2017   Surgery, elective 08/02/2016   Spondylolisthesis, lumbar region 07/31/2016   Hyperlipemia 04/12/2016   Gait disturbance 01/26/2016   Spinal stenosis of lumbar region 01/26/2016   Progressive focal motor weakness 01/12/2016   Muscle atrophy 01/12/2016   Urinary urgency 01/12/2016   Encounter for long-term (current) use of high-risk medication 03/03/2015   Essential hypertension 03/03/2015   Atrial fibrillation with RVR ([place]) 04/18/2014   Palpitations 04/18/2014   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

## 2021-08-20 NOTE — Progress Notes (Signed)
[name]  GGE:[id number] DOB: 24-Jul-1941 DOA: 08/15/2021 PCP: Kaitlyn Small, MD Requesting physician: [name] Date of consultation: 08/16/21 Reason for consultation: Medical Management  Subjective: No dyspnea, hypoxia, chest pain or other complaints. Nausea is resolved. Had 2 Hoover BMs this AM.  Objective: BP (!) 139/45 (BP Location: Left Arm)   Pulse 84   Temp 98 F (36.7 C) (Oral)   Resp 18   Ht 5\' 2"  (1.575 m)   Wt 66.6 kg   LMP  (LMP Unknown)   SpO2 90%   BMI 26.85 kg/m   Gen: 80 y.o. female in no distress Pulm: Nonlabored breathing room air. Clear. CV: Regular rate and rhythm. No murmur, rub, or gallop. No JVD, no pitting dependent edema. GI: Abdomen softer, minimally tender, incisions without surrounding erythema or discharge or dehiscence. + BS.  Ext: Warm, no deformities Skin: No other rashes, lesions or ulcers on visualized skin. Neuro: Alert and oriented. No focal neurological deficits. Psych: Judgement and insight appear fair. Mood euthymic & affect congruent. Behavior is appropriate.    Assessment & Plan: Acute hypoxic respiratory failure due to RLL pneumonia: Atelectasis also likely contributing. +RLL infiltrate on my review of CXR this AM. With leukocytosis and positive PCT, suspicious for pneumonia. - No longer requiring supplemental oxygen. - Changed augmentin to unasyn given NPO status. Would be fine to switch back to augmentin once allowed to take po, still plan 5 days total whether that is all inpatient or completed as outpatient.  - Encourage frequent use of incentive spirometry.   Paroxysmal atrial fibrillation: Followed by Dr. Curt Hoover, also s/p ablation for AVNRT 2019 and AFib ablation 2018. RRR currently. - Continue home diltiazem. Ok for sips with meds per primary.  - Restarted eliquis 10/1 per surgery. CHA2DS2-VASc score is 5. If having emesis or strict NPO, would switch to lovenox.    HTN:  - Holding  chlorthalidone. BP is at goal (accounting for isolated systolic hypertension).  Postoperative ileus: Symptoms consistent with dilated Hoover bowel 3.6-3.8cm on my review. No free air and no peritonitis on exam. - Mgmt per surgery.   Hypokalemia: Improved.  - Monitor.  Hyponatremia: Improved.   Anxiety, depression, schizoaffective disorder, insomnia: Chronic and quiescent. - Continue home ziprasidone, iloperidone, lamictal, temazepam. Pt also on donepezil though no mention of dementia in her records. No objection to continuing it here. These do not all have easy IV equivalents.    Hyperglycemia: Mild. HbA1c 5%  - Monitor on metabolic panel.   HLD:  - Can hold statin acutely, but ok to restart at discharge    Hypothyroidism:  - Continue stable dose synthroid. Can change to 1/2 dose by IV if needed.   Invasive adenocarcinoma of the colon, s/p robot-assisted laparoscopic colectomy by Dr. Marcello Hoover 9/28. - Plans per primary team.   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 - All the more important to emphasize early and frequent mobility.  [name], MD [facility] [email or web address] 08/20/2021, 8:39 AM

## 2021-08-20 NOTE — TOC Transition Note (Signed)
Transition of Care [facility]) - CM/SW Discharge Note   Patient Details  Name: Kaitlyn Hoover MRN: [id number] Date of Birth: 1941-04-16  [facility] CM/SW Contact:  Lennart Pall, LCSW Phone Number: 08/20/2021, 2:08 PM   Clinical Narrative:    Pt reporting she has a rw at home already.  HHPT arranged with [place].  No further TOC needs and will sign off.  Please reorder if new needs arise.   Final next level of care: [place] Barriers to Discharge: Continued Medical Work up   Patient Goals and CMS Choice        Discharge Placement                       Discharge Plan and Services                DME Arranged: N/A DME Agency: NA       HH Arranged: PT HH Agency: [place] ([facility]) Date HH Agency Contacted: 08/18/21 Time [place]: 3300 Representative spoke with at [place]: [place] Determinants of Health ([place]) Interventions     Readmission Risk Interventions No flowsheet data found.

## 2021-08-21 MED ORDER — PANTOPRAZOLE SODIUM 40 MG PO TBEC
40.0000 mg | DELAYED_RELEASE_TABLET | Freq: Every day | ORAL | Status: DC
  Administered 2021-08-22 – 2021-08-23 (×2): 40 mg via ORAL
  Filled 2021-08-21 (×2): qty 1

## 2021-08-21 MED ORDER — ALBUTEROL SULFATE (2.5 MG/3ML) 0.083% IN NEBU
2.5000 mg | INHALATION_SOLUTION | Freq: Once | RESPIRATORY_TRACT | Status: AC
  Administered 2021-08-21: 2.5 mg via RESPIRATORY_TRACT
  Filled 2021-08-21: qty 3

## 2021-08-21 NOTE — Care Management Important Message (Signed)
Important Message  Patient Details IM Letter given to the Patient. Name: Kaitlyn Hoover MRN: [id number] Date of Birth: [birth year]   Medicare Important Message Given:  Yes     Kerin Salen 08/21/2021, 2:45 PM

## 2021-08-21 NOTE — Progress Notes (Signed)
Physical Therapy Treatment Patient Details Name: Kaitlyn Hoover MRN: [id number] DOB: [birth year] Today's Date: 08/21/2021   History of Present Illness 80 y.o. female s/p robotic right colectomy on 9/28 due to colon mass. 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 PMH significant for anxiety, OA, Afib, bipolar disorder, GERD, HTN, hypothyroidism, post-polio syndrome with Rt sided weakness.    PT Comments    Pt is mobilizing well.    Recommendations for follow up therapy are one component of a multi-disciplinary discharge planning process, led by the attending physician.  Recommendations may be updated based on patient status, additional functional criteria and insurance authorization.  Follow Up Recommendations  Home health PT;Supervision/Assistance - 24 hour     Equipment Recommendations  None recommended by PT    Recommendations for Other Services       Precautions / Restrictions Precautions Precautions: Fall Restrictions Weight Bearing Restrictions: No     Mobility  Bed Mobility Overal bed mobility: Needs Assistance Bed Mobility: Sit to Supine       Sit to supine: Supervision        Transfers Overall transfer level: Needs assistance Equipment used: Rolling walker (2 wheeled) Transfers: Sit to/from Stand Sit to Stand: Min guard Stand pivot transfers: Min guard       General transfer comment: Min guard for safety. Cues for safety  Ambulation/Gait Ambulation/Gait assistance: Min guard Gait Distance (Feet): 150 Feet Assistive device: Rolling walker (2 wheeled) Gait Pattern/deviations: Step-through pattern;Decreased stride length     General Gait Details: limp from polio, reports feeling weak with gait   Stairs             Wheelchair Mobility    Modified Rankin (Stroke Patients Only)       Balance Overall balance assessment: Needs assistance         Standing  balance support: Bilateral upper extremity supported Standing balance-Leahy Scale: Poor                              Cognition Arousal/Alertness: Awake/alert Behavior During Therapy: WFL for tasks assessed/performed Overall Cognitive Status: Within Functional Limits for tasks assessed                                        Exercises      General Comments        Pertinent Vitals/Pain Pain Assessment: Faces Faces Pain Scale: Hurts a little bit Pain Location: abdomen Pain Descriptors / Indicators: Discomfort;Sore Pain Intervention(s): Limited activity within patient's tolerance;Monitored during session    Home Living                      Prior Function            PT Goals (current goals can now be found in the care plan section) Progress towards PT goals: Progressing toward goals    Frequency    Min 3X/week      PT Plan Current plan remains appropriate    Co-evaluation              AM-PAC PT "6 Clicks" Mobility   Outcome Measure  Help needed turning from your back to your side while in a flat bed without using bedrails?: None Help needed moving from lying on your back to sitting  on the side of a flat bed without using bedrails?: None Help needed moving to and from a bed to a chair (including a wheelchair)?: A Little Help needed standing up from a chair using your arms (e.g., wheelchair or bedside chair)?: A Little Help needed to walk in hospital room?: A Little Help needed climbing 3-5 steps with a railing? : A Little 6 Click Score: 20    End of Session Equipment Utilized During Treatment: Gait belt Activity Tolerance: Patient tolerated treatment well Patient left: in bed;with call bell/phone within reach   PT Visit Diagnosis: Muscle weakness (generalized) (M62.81);Unsteadiness on feet (R26.81)     Time: 7673-4193 PT Time Calculation (min) (ACUTE ONLY): 11 min  Charges:  $Gait Training: 8-22 mins                         Doreatha Massed, PT Acute Rehabilitation  Office: [phone] Pager: [phone]

## 2021-08-21 NOTE — Progress Notes (Signed)
[facility] PROGRESS [facility]  Kaitlyn Hoover  [id number]:[id number] DOB: [birth year] DOA: 08/15/2021 PCP: [name], MD Requesting physician: [name] Date of consultation: 08/16/21 Reason for consultation: Medical Management  Subjective: Having BMs again today. She's having some wheezing this morning, but no chest pain or dyspnea. No fevers.   Objective: BP (!) 138/44 (BP Location: Left Arm)   Pulse 83   Temp 98.4 F (36.9 C) (Oral)   Resp 16   Ht 5\' 2"  (1.575 m)   Wt 66.6 kg   LMP  (LMP Unknown)   SpO2 95%   BMI 26.85 kg/m   Gen: 80 y.o. female in no distress Pulm: Nonlabored with expiratory wheezes, still good aeration. No crackles.  CV: Regular rate and rhythm. No murmur, rub, or gallop. No JVD, trace dependent edema. GI: Abdomen mildly distended, mildly tender with +BS.   Ext: Warm, no deformities Skin: No new rashes, lesions or ulcers on visualized skin. Neuro: Alert and oriented. No focal neurological deficits. Psych: Judgement and insight appear fair. Mood euthymic & affect congruent. Behavior is appropriate.    Assessment & Plan: Acute hypoxic respiratory failure due to RLL pneumonia: Atelectasis also likely contributing. +RLL infiltrate on my review of CXR this AM. With leukocytosis and positive PCT, suspicious for pneumonia. - No longer requiring supplemental oxygen. - Will give neb for wheezing. If not improved, consider repeat CXR. - Changed augmentin to unasyn given NPO status. Would be fine to switch back to augmentin once allowed to take po, still plan 5 days total whether that is all inpatient or completed as outpatient.  - Encourage frequent use of incentive spirometry.   Paroxysmal atrial fibrillation: Followed by Dr. 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 RRR currently. - Continue home diltiazem. Ok for sips with meds per primary.  - Restarted eliquis 10/1 per surgery. CHA2DS2-VASc score is 5. If having emesis or strict NPO,  would switch to lovenox.    HTN:  - Holding chlorthalidone. BP is at goal (accounting for isolated systolic hypertension).  Postoperative ileus: Symptoms consistent with dilated small bowel 3.6-3.8cm on my review. No free air and no peritonitis on exam. - Mgmt per surgery.   Hypokalemia: Improved.  - Monitor.  Hyponatremia: Improved.   Anxiety, depression, schizoaffective disorder, insomnia: Chronic and quiescent. - Continue home ziprasidone, iloperidone, lamictal, temazepam. Pt also on donepezil though no mention of dementia in her records. No objection to continuing it here. These do not all have easy IV equivalents.    Hyperglycemia: Mild. HbA1c 5%  - Monitor on metabolic panel.   HLD:  - Can hold statin acutely, but ok to restart at discharge    Hypothyroidism:  - Continue stable dose synthroid. Can change to 1/2 dose by IV if needed.   Invasive adenocarcinoma of the colon, s/p robot-assisted laparoscopic colectomy by Dr. Marcello Moores 9/28. - Plans per primary team.   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 - All the more important to emphasize early and frequent mobility.  [name], MD [facility] [email or web address] 08/21/2021, 9:22 AM

## 2021-08-21 NOTE — Progress Notes (Signed)
6 Days Post-Op Robotic Right Colectomy Subjective: Feels better today.  Denies nausea, having some BM's, ambulated in hall   Objective: Vital signs in last 24 hours: Temp:  [98 F (36.7 C)-99.3 F (37.4 C)] 98 F (36.7 C) (10/04 1334) Pulse Rate:  [83-90] 88 (10/04 1334) Resp:  [16-17] 16 (10/04 1334) BP: (127-138)/(44-52) 127/49 (10/04 1334) SpO2:  [93 %-96 %] 96 % (10/04 1334)   Intake/Output from previous day: 10/03 0701 - 10/04 0700 In: 1820.2 [P.O.:120; I.V.:1600.2; IV Piggyback:100] Out: 0  Intake/Output this shift: Total I/O In: 894.9 [I.V.:594.9; IV Piggyback:300] Out: -    General appearance: alert and cooperative GI: soft, mild distention   Incision: no significant drainage  Lab Results:  No results for input(s): WBC, HGB, HCT, PLT in the last 72 hours.  BMET Recent Labs    08/19/21 0442  NA 136  K 4.2  CL 103  CO2 26  GLUCOSE 117*  BUN 20  CREATININE 0.73  CALCIUM 8.1*    PT/INR No results for input(s): LABPROT, INR in the last 72 hours. ABG No results for input(s): PHART, HCO3 in the last 72 hours.  Invalid input(s): PCO2, PO2  MEDS, Scheduled  acetaminophen  1,000 mg Oral Q6H   acyclovir  400 mg Oral QHS   apixaban  5 mg Oral BID   Chlorhexidine Gluconate Cloth  6 each Topical Daily   diltiazem  240 mg Oral Daily   donepezil  10 mg Oral QHS   fluticasone  2 spray Each Nare Daily   Iloperidone  1 mg Oral QHS   lamoTRIgine  100 mg Oral Daily   levothyroxine  100 mcg Oral Q0600   melatonin  3 mg Oral QHS   oxybutynin  15 mg Oral Daily   pantoprazole  40 mg Oral Daily   saccharomyces boulardii  250 mg Oral BID   temazepam  15 mg Oral QHS   ziprasidone  40 mg Oral QHS    Studies/Results: No results found.  Assessment: s/p Procedure(s): XI ROBOT ASSISTED LAPAROSCOPIC RIGHT COLECTOMY Patient Active Problem List   Diagnosis Date Noted   Colon cancer ([place]) 08/15/2021   Chronic midline back pain 03/24/2019   Insomnia due to other  mental disorder 09/29/2018   Schizoaffective disorder ([place]) 09/15/2018   Atrial fibrillation ( AFB) 04/25/2017   Hyperlipidemia 03/11/2017   Surgery, elective 08/02/2016   Spondylolisthesis, lumbar region 07/31/2016   Hyperlipemia 04/12/2016   Gait disturbance 01/26/2016   Spinal stenosis of lumbar region 01/26/2016   Progressive focal motor weakness 01/12/2016   Muscle atrophy 01/12/2016   Urinary urgency 01/12/2016   Encounter for long-term (current) use of high-risk medication 03/03/2015   Essential hypertension 03/03/2015   Atrial fibrillation with RVR ([place]) 04/18/2014   Palpitations 04/18/2014   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

## 2021-08-22 MED ORDER — AMOXICILLIN-POT CLAVULANATE 875-125 MG PO TABS
1.0000 | ORAL_TABLET | Freq: Two times a day (BID) | ORAL | Status: AC
  Administered 2021-08-22: 1 via ORAL
  Filled 2021-08-22: qty 1

## 2021-08-22 NOTE — Progress Notes (Signed)
7 Days Post-Op Robotic Right Colectomy Subjective: Feels better today. having some BM's, ambulated in hall   Objective: Vital signs in last 24 hours: Temp:  [97.9 F (36.6 C)-98.3 F (36.8 C)] 97.9 F (36.6 C) (10/05 6770) Pulse Rate:  [82-91] 82 (10/05 0614) Resp:  [16-18] 18 (10/05 0614) BP: (127-154)/(49-59) 133/59 (10/05 0614) SpO2:  [95 %-96 %] 95 % (10/05 0614)   Intake/Output from previous day: 10/04 0701 - 10/05 0700 In: 1254.9 [P.O.:360; I.V.:594.9; IV Piggyback:300] Out: -  Intake/Output this shift: No intake/output data recorded.   General appearance: alert and cooperative GI: soft, mild distention   Incision: no significant drainage  Lab Results:  No results for input(s): WBC, HGB, HCT, PLT in the last 72 hours.  BMET No results for input(s): NA, K, CL, CO2, GLUCOSE, BUN, CREATININE, CALCIUM in the last 72 hours.  PT/INR No results for input(s): LABPROT, INR in the last 72 hours. ABG No results for input(s): PHART, HCO3 in the last 72 hours.  Invalid input(s): PCO2, PO2  MEDS, Scheduled  acetaminophen  1,000 mg Oral Q6H   acyclovir  400 mg Oral QHS   apixaban  5 mg Oral BID   Chlorhexidine Gluconate Cloth  6 each Topical Daily   diltiazem  240 mg Oral Daily   donepezil  10 mg Oral QHS   fluticasone  2 spray Each Nare Daily   Iloperidone  1 mg Oral QHS   lamoTRIgine  100 mg Oral Daily   levothyroxine  100 mcg Oral Q0600   melatonin  3 mg Oral QHS   oxybutynin  15 mg Oral Daily   pantoprazole  40 mg Oral Daily   saccharomyces boulardii  250 mg Oral BID   temazepam  15 mg Oral QHS   ziprasidone  40 mg Oral QHS    Studies/Results: No results found.  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   Schizoaffective disorder ([place]) 09/15/2018    Atrial fibrillation ([place]) 04/25/2017   Hyperlipidemia 03/11/2017   Surgery, elective 08/02/2016   Spondylolisthesis, lumbar region 07/31/2016   Hyperlipemia 04/12/2016   Gait disturbance 01/26/2016   Spinal stenosis of lumbar region 01/26/2016   Progressive focal motor weakness 01/12/2016   Muscle atrophy 01/12/2016   Urinary urgency 01/12/2016   Encounter for long-term (current) use of high-risk medication 03/03/2015   Essential hypertension 03/03/2015   Atrial fibrillation with RVR ([place]) 04/18/2014   Palpitations 04/18/2014   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 [place] [facility], [place]    08/22/2021 9:32 AM

## 2021-08-22 NOTE — Progress Notes (Signed)
   08/22/21 1200  Mobility  Activity Ambulated in hall  Level of Assistance Standby assist, set-up cues, supervision of patient - no hands on  Assistive Device Front wheel walker  Distance Ambulated (ft) 160 ft  Mobility Ambulated with assistance in hallway  Mobility Response Tolerated well  Mobility performed by Mobility specialist  $Mobility charge 1 Mobility   Pt agreeable to mobilize today. Ambulated in the hall about 139ft with RW, tolerated well. She is easily tired out, and she took one sitting rest break about halfway through the session. Left pt in bed with call bell at side. Notified RN of session.    [place] Specialist Acute Rehab Services Office: [phone]

## 2021-08-22 NOTE — Progress Notes (Signed)
[facility] PROGRESS NOTE    ELEANA TOCCO  EZM:[id number] DOB: January 08, 1941 DOA: 08/15/2021 PCP: [name], MD      Brief Narrative:  Mrs. Gambone is a 80 y.o. F with Afib on Eliquis, hypothyroidism, and HTN who presented for elective colectomy for colon cancer.  A recent colonoscopy showed a mass in the transverse colon and biopsy showed it to be invasive adenoCA.  Admitted for elective colectomy, which was done 5/03 without complication.  Mild confusion overnight that night and hyponatremia and hypokalemia noted the next morning, hospitalists consulted.   9/28 Admitted for colectomy 9/29 Mild hyponatremia and hypokalemia noted, medicine consulted 10/1 Some hypoxia, cough and dyspnea --> CXR with RLL 10/2 Vomiting, abdominal discomfort, diagnosed with mild ileus 10/4 feeling better, advancing diet without difficulty 10/5 Weaned off O2, advanced to solid diet       Medical recommendations for post discharge care: Has completed antibiotics  Would resume all home medicines at discharge, EXCEPT chlorthalidone --> would hold until PCP follow up Follow-up with PCP in 1 week Check BMP/mag either at PCPs office or at surgery office at postop follow-up Encourage IS at discharge              Assessment & Plan:  Invasive adenocarcinoma of the colon Status post robotic assisted laparoscopic colectomy 9/28 by Dr. Marcello Moores  Hyponatremia Hypokalemia Hypomagnesemia Supplemented and resolved  Right lower lobe postop pneumonia Acute hypoxic respiratory failure ruled out Completed 5 days Augmentin/Unasyn tonight.  Patient now mentating at baseline, taking orals.  Temp < 100 F, heart rate < 100bpm, RR < 24, SpO2 at baseline.   Stable for discharge from pneumonia standpoint.     Hyperlipidemia -Crestor is on hold, restart at discharge  Hypertension Blood pressure controlled  -Continue diltiazem - Hold chlorthalidone  Paroxysmal atrial  fibrillation -Continue Eliquis, diltiazem  Mood disorder Memory impairment -Continue donepezil, iloperidone, Lamictal, Geodon  HSV - Continue acyclovir  Hypothyroidism - Continue levothyroxine  Post polio syndrome  Other medications - Continue temazepam, PPI, Ditropan             MDM: The below labs and imaging reports were reviewed and summarized above.  Medication management as above.    DVT prophylaxis: SCD's Start: 08/15/21 1557 apixaban (ELIQUIS) tablet 5 mg  Code Status: FULL Family Communication: Brother at bedside          Subjective: Feeling well.  Appetite good.  No confusion.  No chest pain, dyspnea.  Objective: Vitals:   08/21/21 1334 08/21/21 2144 08/22/21 0614 08/22/21 1304  BP: (!) 127/49 (!) 154/57 (!) 133/59 (!) 135/50  Pulse: 88 91 82 80  Resp: 16 16 18 16   Temp: 98 F (36.7 C) 98.3 F (36.8 C) 97.9 F (36.6 C) 98 F (36.7 C)  TempSrc:      SpO2: 96% 96% 95% 96%  Weight:      Height:        Intake/Output Summary (Last 24 hours) at 08/22/2021 1556 Last data filed at 08/22/2021 1455 Gross per 24 hour  Intake 1065.69 ml  Output --  Net 1065.69 ml   Filed Weights   08/15/21 1731 08/16/21 0619  Weight: 68.6 kg 66.6 kg    Examination: General appearance:  adult female, alert and in no acute distress, sitting up in bed, interactive HEENT: Anicteric, conjunctiva pink, lids and lashes normal. No nasal deformity, discharge, epistaxis.  Lips moist.   Skin: Warm and dry.  no jaundice.    Cardiac: RRR,  nl S1-S2, no murmurs appreciated.  No lower extremity edema. Respiratory: Normal respiratory rate and rhythm.  CTAB without rales or wheezes. Abdomen: Abdomen soft.  no TTP. No ascites, distension.   MSK:   Neuro: Awake and alert.  EOMI, moves upper extremities with normal strength and coordination, face symmetric. Speech fluent.    Psych: Sensorium intact and responding to questions, attention normal. Affect appropriate.  Judgment  and insight appear normal.    Data Reviewed: I have personally reviewed following labs and imaging studies:  CBC: Recent Labs  Lab 08/16/21 0519 08/17/21 0428  WBC 10.7* 10.8*  HGB 9.8* 9.6*  HCT 28.4* 29.1*  MCV 89.9 94.8  PLT 210 725   Basic Metabolic Panel: Recent Labs  Lab 08/16/21 0519 08/16/21 1904 08/17/21 0428 08/18/21 0452 08/19/21 0442  NA 127* 134* 135 135 136  K 3.0* 3.1* 4.1 4.7 4.2  CL 93* 100 104 105 103  CO2 24 28 25 26 26   GLUCOSE 177* 113* 113* 117* 117*  BUN 11 11 11 17 20   CREATININE 0.76 0.79 0.76 0.70 0.73  CALCIUM 8.1* 8.1* 8.0* 8.1* 8.1*  MG 1.4*  --  2.3  --   --   PHOS  --  1.7*  --   --   --    GFR: Estimated Creatinine Clearance: 51 mL/min (by C-G formula based on SCr of 0.73 mg/dL). Liver Function Tests: Recent Labs  Lab 08/16/21 1904  ALBUMIN 2.8*   No results for input(s): LIPASE, AMYLASE in the last 168 hours. No results for input(s): AMMONIA in the last 168 hours. Coagulation Profile: No results for input(s): INR, PROTIME in the last 168 hours. Cardiac Enzymes: No results for input(s): CKTOTAL, CKMB, CKMBINDEX, TROPONINI in the last 168 hours. BNP (last 3 results) No results for input(s): PROBNP in the last 8760 hours. HbA1C: No results for input(s): HGBA1C in the last 72 hours. CBG: Recent Labs  Lab 08/16/21 0339  GLUCAP 176*   Lipid Profile: No results for input(s): CHOL, HDL, LDLCALC, TRIG, CHOLHDL, LDLDIRECT in the last 72 hours. Thyroid Function Tests: No results for input(s): TSH, T4TOTAL, FREET4, T3FREE, THYROIDAB in the last 72 hours. Anemia Panel: No results for input(s): VITAMINB12, FOLATE, FERRITIN, TIBC, IRON, RETICCTPCT in the last 72 hours. Urine analysis: No results found for: COLORURINE, APPEARANCEUR, LABSPEC, PHURINE, GLUCOSEU, HGBUR, BILIRUBINUR, KETONESUR, PROTEINUR, UROBILINOGEN, NITRITE, LEUKOCYTESUR Sepsis Labs: @LABRCNTIP (procalcitonin:4,lacticacidven:4)  ) Recent Results (from the past 240  hour(s))  SARS Coronavirus 2 (TAT 6-24 hrs)     Status: None   Collection Time: 08/13/21 12:00 AM  Result Value Ref Range Status   SARS Coronavirus 2 RESULT: NEGATIVE  Final    Comment: RESULT: NEGATIVESARS-CoV-2 INTERPRETATION:A NEGATIVE  test result means that SARS-CoV-2 RNA was not present in the specimen above the limit of detection of this test. This does not preclude a possible SARS-CoV-2 infection and should not be used as the  sole basis for patient management decisions. Negative results must be combined with clinical observations, patient history, and epidemiological information. Optimum specimen types and timing for peak viral levels during infections caused by SARS-CoV-2  have not been determined. Collection of multiple specimens or types of specimens may be necessary to detect virus. Improper specimen collection and handling, sequence variability under primers/probes, or organism present below the limit of detection may  lead to false negative results. Positive and negative predictive values of testing are highly dependent on prevalence. False negative test results are more likely when prevalence of disease  is high.The expected result is NEGATIVE.Fact S heet for  Healthcare Providers: LocalChronicle.no Sheet for Patients: SalonLookup.es Reference Range - Negative          Radiology Studies: No results found.      Scheduled Meds:  acetaminophen  1,000 mg Oral Q6H   acyclovir  400 mg Oral QHS   apixaban  5 mg Oral BID   Chlorhexidine Gluconate Cloth  6 each Topical Daily   diltiazem  240 mg Oral Daily   donepezil  10 mg Oral QHS   fluticasone  2 spray Each Nare Daily   Iloperidone  1 mg Oral QHS   lamoTRIgine  100 mg Oral Daily   levothyroxine  100 mcg Oral Q0600   melatonin  3 mg Oral QHS   oxybutynin  15 mg Oral Daily   pantoprazole  40 mg Oral Daily   saccharomyces boulardii  250 mg Oral BID   temazepam   15 mg Oral QHS   ziprasidone  40 mg Oral QHS   Continuous Infusions:  sodium chloride 10 mL/hr at 08/21/21 1517     LOS: 7 days    Time spent: 25-minute    Edwin Dada, MD [facility] 08/22/2021, 3:56 PM     Please page though Shea Evans or Epic secure chat:  For [employer], Adult nurse

## 2021-08-23 NOTE — TOC Transition Note (Signed)
Transition of Care [facility]) - CM/SW Discharge Note   Patient Details  Name: Kaitlyn Hoover MRN: [id number] Date of Birth: December 30, 1940  Transition of Care [facility]) CM/SW Contact:  Leeroy Cha, RN Phone Number: 08/23/2021, 12:33 PM   Clinical Narrative:    Dcd to home with heal;th care in place.   Final next level of care: [place] Barriers to Discharge: Continued Medical Work up   Patient Goals and CMS Choice        Discharge Placement                       Discharge Plan and Services                DME Arranged: N/A DME Agency: NA       HH Arranged: PT HH Agency: [place] ([facility]) Date HH Agency Contacted: 08/18/21 Time Katherine: 6116 Representative spoke with at [place]: [place] Determinants of Health ([place]) Interventions     Readmission Risk Interventions No flowsheet data found.

## 2021-08-23 NOTE — Discharge Summary (Signed)
Physician Discharge Summary  Patient ID: MELESSIA KAUS MRN: [id number] DOB/AGE: 08/06/1941 80 y.o.  Admit date: 08/15/2021 Discharge date: 08/23/2021  Admission Diagnoses: Colon cancer  Discharge Diagnoses:  Active Problems:   Colon cancer ([place]) Stage 3  Discharged Condition: good  Hospital Course: The patient was admitted after surgery.  That evening and the following am, the patient was confused, sob and was noted to have some electrolyte imbalances.  TRH services were consulted.  Her diet was advanced, but she developed an ileus and was made NPO around POD 3.  She was diagnosed with a RLL pneumonia and started on the appropriate antibiotics.  Her ileus then resolved and her diet was advanced again and she was weaned off her O2.  By postop day 8, She was tolerating a solid diet and pain was controlled with oral medications.  She was urinating without difficulty and ambulating without assistance.  Patient was felt to be in stable condition for discharge to home.   Consults:  hospitalist  Significant Diagnostic Studies: labs: cbc, bmet and radiology: CXR: infiltrates: lower lobe on the right  Treatments: antibiotics: Unasyn and surgery: robotic right colectomy  Discharge Exam: Blood pressure (!) 159/68, pulse (!) 102, temperature 98.2 F (36.8 C), temperature source Oral, resp. rate 20, height 5\' 2"  (1.575 m), weight 66.6 kg, SpO2 96 %. General appearance: alert and cooperative GI: normal findings: soft, non-tender Incisions clean, dry, intact  Disposition: home   Allergies as of 08/23/2021       Reactions   Sulfa Antibiotics Rash, Other (See Comments)   Childhood allergy   Ciprocinonide [fluocinolone] Rash   Ciprofloxacin Rash        Medication List     STOP taking these medications    chlorthalidone 25 MG tablet Commonly known as: HYGROTON       TAKE these medications    acyclovir 400 MG tablet Commonly known as: ZOVIRAX Take 400 mg at bedtime by mouth.    alendronate 70 MG tablet Commonly known as: FOSAMAX Take 70 mg by mouth every Sunday. Take with a full glass of water on an empty stomach.   azelastine 0.05 % ophthalmic solution Commonly known as: OPTIVAR Place 2 drops into both eyes daily as needed. Inching eyes   b complex vitamins capsule Take 1 capsule by mouth daily.   CALCIUM CITRATE + D PO Take 1,200 mg by mouth at bedtime.   cholecalciferol 25 MCG (1000 UNIT) tablet Commonly known as: VITAMIN D Take 1,000 Units by mouth daily.   Co Q-10 100 MG Caps Take 100 mg by mouth daily.   diltiazem 240 MG 24 hr capsule Commonly known as: CARDIZEM CD TAKE 1 CAPSULE BY MOUTH EVERY DAY   donepezil 10 MG tablet Commonly known as: ARICEPT Take 1 tablet (10 mg total) by mouth daily. What changed: when to take this   Eliquis 5 MG Tabs tablet Generic drug: apixaban TAKE 1 TABLET BY MOUTH TWICE A DAY   Fanapt 4 MG Tabs tablet Generic drug: iloperidone Take 1 tablet (4 mg total) by mouth at bedtime. What changed: how much to take   ferrous sulfate 324 MG Tbec Take 324 mg by mouth daily with breakfast.   fexofenadine 180 MG tablet Commonly known as: ALLEGRA Take 180 mg by mouth daily.   Fish Oil Burp-Less 1200 MG Caps Take 1,200 mg by mouth daily.   fluticasone 50 MCG/ACT nasal spray Commonly known as: FLONASE Place 2 sprays into both nostrils daily.   Sanjuan Dame  17 GM/SCOOP powder Generic drug: polyethylene glycol powder Take 17 g by mouth daily. Miralax   Ginkgo Biloba 120 MG Tabs Take 120 mg by mouth daily.   lamoTRIgine 100 MG tablet Commonly known as: LaMICtal Take 1 tablet (100 mg total) by mouth daily.   levothyroxine 100 MCG tablet Commonly known as: SYNTHROID Take 100 mcg by mouth daily before breakfast.   MAGNESIUM PO Take 250 mg by mouth every evening.   melatonin 3 MG Tabs tablet Take 3 mg by mouth at bedtime.   multivitamin capsule Take 1 capsule daily by mouth.   oxybutynin 15 MG 24 hr  tablet Commonly known as: DITROPAN XL Take 15 mg by mouth daily.   pantoprazole 40 MG tablet Commonly known as: PROTONIX Take 40 mg by mouth daily.   Ribose (D) Powd Take 2 g by mouth daily.   rosuvastatin 10 MG tablet Commonly known as: CRESTOR TAKE 1 TABLET UP TO 4 TIMES A WEEK ON SUNDAY, MONDAY, WEDNESDAY AND Friday. Please make overdue appt with Dr. Skains before anymore refills. Thank you 2nd attempt   sodium chloride 0.65 % Soln nasal spray Commonly known as: OCEAN Place 2 sprays into both nostrils every evening.   temazepam 15 MG capsule Commonly known as: RESTORIL Take 1 capsule (15 mg total) by mouth at bedtime.   traMADol 50 MG tablet Commonly known as: ULTRAM Take 1 tablet (50 mg total) by mouth 3 (three) times daily as needed. What changed: reasons to take this   Turmeric 500 MG Caps Take 1,000 mg by mouth daily.   vitamin B-12 1000 MCG tablet Commonly known as: CYANOCOBALAMIN Take 1,000 mcg by mouth daily.   vitamin C 1000 MG tablet Take 1,000 mg by mouth daily.   ziprasidone 40 MG capsule Commonly known as: GEODON TAKE 1 CAPSULE BY MOUTH EVERY DAY What changed:  how much to take when to take this               Durable Medical Equipment  (From admission, onward)           Start     Ordered   08/19/21 0707  For home use only DME Walker rolling  Once       Question Answer Comment  Walker: With 5 Inch Wheels   Patient needs a walker to treat with the following condition Recent major surgery      08/19/21 0706   08/18/21 1453  For home use only DME Walker rolling  Once       Question Answer Comment  Walker: With 5 Inch Wheels   Patient needs a walker to treat with the following condition Weakness      10 /01/22 1453            Follow-up Information     Leighton Ruff, MD Follow up in 2 week(s).   Specialties: General Surgery, Colon and Rectal Surgery Contact information: [address] [place]  [id number] [place] Follow up.   Why: to provide home health physical therapy visits Contact information: [phone]        [name], MD. Schedule an appointment as soon as possible for a visit in 1 week(s).   Specialty: Family Medicine Why: for hospital f/u and lab work Contact information: [place] [address] [phone]         Constance Haw, MD .   Specialty:  Cardiology Contact information: [address] [place] [address] [phone]                 Signed: Rosario Adie 16/03/7902, 9:02 AM

## 2021-08-23 NOTE — Progress Notes (Signed)
Discharge instructions given to patient and all questions were answered.  

## 2021-08-25 DIAGNOSIS — E890 Postprocedural hypothyroidism: Secondary | ICD-10-CM | POA: Diagnosis not present

## 2021-08-25 DIAGNOSIS — Z87891 Personal history of nicotine dependence: Secondary | ICD-10-CM | POA: Diagnosis not present

## 2021-08-25 DIAGNOSIS — Z79899 Other long term (current) drug therapy: Secondary | ICD-10-CM | POA: Diagnosis not present

## 2021-08-25 DIAGNOSIS — G14 Postpolio syndrome: Secondary | ICD-10-CM | POA: Diagnosis not present

## 2021-08-25 DIAGNOSIS — I10 Essential (primary) hypertension: Secondary | ICD-10-CM | POA: Diagnosis not present

## 2021-08-25 DIAGNOSIS — M1990 Unspecified osteoarthritis, unspecified site: Secondary | ICD-10-CM | POA: Diagnosis not present

## 2021-08-25 DIAGNOSIS — I4891 Unspecified atrial fibrillation: Secondary | ICD-10-CM | POA: Diagnosis not present

## 2021-08-25 DIAGNOSIS — Z9049 Acquired absence of other specified parts of digestive tract: Secondary | ICD-10-CM | POA: Diagnosis not present

## 2021-08-25 DIAGNOSIS — K219 Gastro-esophageal reflux disease without esophagitis: Secondary | ICD-10-CM | POA: Diagnosis not present

## 2021-08-25 DIAGNOSIS — Z7901 Long term (current) use of anticoagulants: Secondary | ICD-10-CM | POA: Diagnosis not present

## 2021-08-25 DIAGNOSIS — Z9181 History of falling: Secondary | ICD-10-CM | POA: Diagnosis not present

## 2021-08-25 DIAGNOSIS — C183 Malignant neoplasm of hepatic flexure: Secondary | ICD-10-CM | POA: Diagnosis not present

## 2021-08-28 ENCOUNTER — Telehealth: Payer: Self-pay | Admitting: *Deleted

## 2021-08-30 ENCOUNTER — Inpatient Hospital Stay: Payer: PPO

## 2021-08-30 ENCOUNTER — Other Ambulatory Visit: Payer: Self-pay

## 2021-08-30 ENCOUNTER — Other Ambulatory Visit (HOSPITAL_COMMUNITY): Payer: Self-pay

## 2021-08-30 ENCOUNTER — Inpatient Hospital Stay: Payer: PPO | Attending: Oncology | Admitting: Oncology

## 2021-08-30 VITALS — BP 137/53 | HR 83 | Temp 98.7°F | Resp 18 | Ht 62.0 in | Wt 134.0 lb

## 2021-08-30 DIAGNOSIS — C183 Malignant neoplasm of hepatic flexure: Secondary | ICD-10-CM | POA: Diagnosis not present

## 2021-08-30 DIAGNOSIS — F319 Bipolar disorder, unspecified: Secondary | ICD-10-CM | POA: Diagnosis not present

## 2021-08-30 DIAGNOSIS — Z8719 Personal history of other diseases of the digestive system: Secondary | ICD-10-CM | POA: Diagnosis not present

## 2021-08-30 DIAGNOSIS — Z7901 Long term (current) use of anticoagulants: Secondary | ICD-10-CM | POA: Insufficient documentation

## 2021-08-30 DIAGNOSIS — I4891 Unspecified atrial fibrillation: Secondary | ICD-10-CM | POA: Insufficient documentation

## 2021-08-30 DIAGNOSIS — C184 Malignant neoplasm of transverse colon: Secondary | ICD-10-CM

## 2021-08-30 DIAGNOSIS — N6310 Unspecified lump in the right breast, unspecified quadrant: Secondary | ICD-10-CM | POA: Insufficient documentation

## 2021-08-30 LAB — CBC WITH DIFFERENTIAL (CANCER CENTER ONLY)
Abs Immature Granulocytes: 0.03 10*3/uL (ref 0.00–0.07)
Basophils Absolute: 0.1 10*3/uL (ref 0.0–0.1)
Basophils Relative: 1 %
Eosinophils Absolute: 0.1 10*3/uL (ref 0.0–0.5)
Eosinophils Relative: 1 %
HCT: 32.1 % — ABNORMAL LOW (ref 36.0–46.0)
Hemoglobin: 10.2 g/dL — ABNORMAL LOW (ref 12.0–15.0)
Immature Granulocytes: 0 %
Lymphocytes Relative: 21 %
Lymphs Abs: 1.8 10*3/uL (ref 0.7–4.0)
MCH: 29.9 pg (ref 26.0–34.0)
MCHC: 31.8 g/dL (ref 30.0–36.0)
MCV: 94.1 fL (ref 80.0–100.0)
Monocytes Absolute: 0.7 10*3/uL (ref 0.1–1.0)
Monocytes Relative: 8 %
Neutro Abs: 5.9 10*3/uL (ref 1.7–7.7)
Neutrophils Relative %: 69 %
Platelet Count: 447 10*3/uL — ABNORMAL HIGH (ref 150–400)
RBC: 3.41 MIL/uL — ABNORMAL LOW (ref 3.87–5.11)
RDW: 13.9 % (ref 11.5–15.5)
WBC Count: 8.7 10*3/uL (ref 4.0–10.5)
nRBC: 0 % (ref 0.0–0.2)

## 2021-08-30 LAB — CMP (CANCER CENTER ONLY)
ALT: 16 U/L (ref 0–44)
AST: 21 U/L (ref 15–41)
Albumin: 3.9 g/dL (ref 3.5–5.0)
Alkaline Phosphatase: 60 U/L (ref 38–126)
Anion gap: 12 (ref 5–15)
BUN: 17 mg/dL (ref 8–23)
CO2: 27 mmol/L (ref 22–32)
Calcium: 9.2 mg/dL (ref 8.9–10.3)
Chloride: 103 mmol/L (ref 98–111)
Creatinine: 0.98 mg/dL (ref 0.44–1.00)
GFR, Estimated: 59 mL/min — ABNORMAL LOW (ref >60)
Glucose, Bld: 103 mg/dL — ABNORMAL HIGH (ref 70–99)
Potassium: 3.7 mmol/L (ref 3.5–5.1)
Sodium: 142 mmol/L (ref 135–145)
Total Bilirubin: 0.4 mg/dL (ref 0.3–1.2)
Total Protein: 6.8 g/dL (ref 6.5–8.1)

## 2021-08-30 LAB — CEA (ACCESS): CEA (CHCC): 9.24 ng/mL — ABNORMAL HIGH (ref 0.00–5.00)

## 2021-08-30 MED ORDER — CAPECITABINE 500 MG PO TABS
ORAL_TABLET | ORAL | 0 refills | Status: DC
  Filled 2021-08-30: qty 84, fill #0

## 2021-08-30 NOTE — Progress Notes (Signed)
[facility] New Patient Consult   Requesting MD: [name], Md [address] [place],  [place] [address]   Kaitlyn Hoover 80 y.o.  July 22, 1941    Reason for Consult: Colon cancer   HPI: Kaitlyn Hoover reports a 67-monthhistory of intermittent rectal bleeding.  Kaitlyn Hoover was referred to Dr. BCristina Gongand was taken to a colonoscopy.  A minimally obstructing mass was noted in the transverse colon.  A biopsy showed invasive adenocarcinoma.  The area was tattooed.  A polyp was noted at the previous polypectomy site which was not amenable to endoscopic removal.  CTs on 06/26/2021 revealed numerous tiny pulmonary nodules throughout the lungs measuring up to 2 mm.  These are felt to likely represent infectious or inflammatory lesions.  Eccentric wall thickening was present at the hepatic flexure.  No enlarged abdominal or pelvic lymph nodes.  Kaitlyn Hoover was referred to Dr. TMarcello Mooresand underwent a robotic assisted right colectomy on 08/07/2021.  A mass and tattoo were noted in the hepatic flexure. 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  Tumor invaded through the serosa.  The resection margins are negative.  Metastatic carcinoma was identified in 3 of 25 lymph nodes.  No macroscopic tumor perforation.  The tumor is moderately differentiated.  No lymphovascular perineural invasion.  Resection margins are negative.  No tumor deposits.  There were 2 additional tubular adenomas.  There was a sessile serrated polyp without dysplasia.  The tumor returned microsatellite stable with no loss of mismatch repair protein expression.  Kaitlyn Hoover has recovered from surgery.  Kaitlyn Hoover is eating.  Kaitlyn Hoover is having bowel movements. 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 [age]History of colon polyps, last colonoscopy 6 years ago Bipolar disease Atrial fibrillation-maintained on anticoagulation   Past Surgical History:  Procedure Laterality  Date   ABDOMINAL HYSTERECTOMY     ATRIAL FIBRILLATION ABLATION N/A 04/25/2017   Procedure: Atrial Fibrillation Ablation;  Surgeon: CConstance Haw MD;  [facility];  Service: Cardiovascular;  Laterality: N/A;   BREAST EXCISIONAL BIOPSY Bilateral    benign   CATARACT EXTRACTION W/ INTRAOCULAR LENS  IMPLANT, BILATERAL     CERVICAL LAMINECTOMY     CESAREAN SECTION     X2    LEGS      AGE 49-11   SEVERAL SURGERIES FOR POLIO    SVT ABLATION N/A 11/26/2017   Procedure: SVT ABLATION;  Surgeon: CConstance Haw MD;  [facility];  Service: Cardiovascular;  Laterality: N/A;   THYROIDECTOMY      Medications: Reviewed  Allergies:  Allergies  Allergen Reactions   Sulfa Antibiotics Rash and Other (See Comments)    Childhood allergy   Ciprocinonide [Fluocinolone] Rash   Ciprofloxacin Rash    Family history: Her father had tongue cancer.  A maternal aunt had colon cancer.  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  Kaitlyn Hoover does not use alcohol.  Kaitlyn Hoover was transfused during pregnancy.  No risk factor for HIV or hepatitis.  Kaitlyn Hoover has received COVID-19 vaccines.  ROS:   Positives include: Rectal bleeding prior to surgery, diarrhea following surgery  A complete ROS was otherwise negative.  Physical Exam:  Blood pressure (!) 137/53, pulse 83, temperature 98.7 F (37.1 C), temperature source Oral, resp. rate 18, height _0  (1.575 m), weight 134 lb (  60.8 kg), SpO2 100 %.  Lungs: Clear bilaterally Cardiac: Regular rate and rhythm Abdomen: No mass, no hepatosplenomegaly, nontender, healed surgical incisions  Vascular: No leg edema Lymph nodes: No cervical, supraclavicular, axillary, or inguinal nodes Neurologic: Alert and oriented, the motor exam appears intact in the upper and lower extremities bilaterally Skin: No rash Musculoskeletal: No spine  tenderness   LAB:  CBC  Lab Results  Component Value Date   WBC 8.7 08/30/2021   HGB 10.2 (L) 08/30/2021   HCT 32.1 (L) 08/30/2021   MCV 94.1 08/30/2021   PLT 447 (H) 08/30/2021   NEUTROABS 5.9 08/30/2021        CMP  Lab Results  Component Value Date   NA 136 08/19/2021   K 4.2 08/19/2021   CL 103 08/19/2021   CO2 26 08/19/2021   GLUCOSE 117 (H) 08/19/2021   BUN 20 08/19/2021   CREATININE 0.73 08/19/2021   CALCIUM 8.1 (L) 08/19/2021   PROT 7.9 04/02/2017   ALBUMIN 2.8 (L) 08/16/2021   AST 31 04/02/2017   ALT 15 04/02/2017   ALKPHOS 81 04/02/2017   BILITOT 0.5 04/02/2017   GFRNONAA >60 08/19/2021   GFRAA 53 (L) 11/02/2020     Lab Results  Component Value Date   CEA1 5.4 (H) 08/06/2021    Imaging: CT images from 06/26/2021 reviewed    Assessment/Plan:   Colon cancer, hepatic flexure, stage IIIb (pT4a,pN1b), status post a right colectomy 08/15/2021 Tumor invades through the serosa, 3/25 lymph nodes, MSS, no loss of mismatch repair protein expression CTs 06/26/2021-eccentric wall thickening of the hepatic flexure, tiny pulmonary nodules measuring up to 2 mm-likely infectious or inflammatory Elevated preoperative CEA History of colon polyps including on the right colectomy specimen 08/15/2021 Bipolar disease Atrial fibrillation maintained on anticoagulation   Disposition:   Kaitlyn Hoover has been diagnosed with stage III colon cancer.  We reviewed details of the surgery pathology report.  We discussed the prognosis and adjuvant treatment options.  I explained the survival benefit associated with adjuvant 5-fluorouracil chemotherapy in this setting.  We also discussed the small absolute expected increase in the cure rate with the addition of oxaliplatin.  Kaitlyn Hoover understands there is not a clear benefit from adjuvant oxaliplatin chemotherapy in older patients.  We discussed the need for Port-A-Cath placement and potential toxicities associated with oxaliplatin.  Kaitlyn Hoover does  not wish to consider oxaliplatin chemotherapy. I recommend single agent capecitabine.  We reviewed potential toxicities associated with capecitabine including the chance of nausea, alopecia, mucositis, diarrhea, and hematologic toxicity.  We discussed the rash, sun sensitivity, hyperpigmentation, and hand/foot syndrome associated with capecitabine.  Kaitlyn Hoover agrees to proceed.  Kaitlyn Hoover will discontinue pantoprazole and the multivitamin while taking adjuvant capecitabine.  Kaitlyn Hoover does not appear to have hereditary nonpropulsive colon cancer syndrome, but her family members are at increased risk developing colorectal cancer and should receive appropriate screening.  Kaitlyn Hoover will return to the lab today for a baseline CBC, chemistry panel, and CEA  The plan is to begin adjuvant capecitabine on 09/10/2021. [name], MD  08/30/2021, 3:26 PM

## 2021-08-31 ENCOUNTER — Other Ambulatory Visit (HOSPITAL_COMMUNITY): Payer: Self-pay

## 2021-08-31 ENCOUNTER — Telehealth: Payer: Self-pay | Admitting: Pharmacy Technician

## 2021-08-31 DIAGNOSIS — Z09 Encounter for follow-up examination after completed treatment for conditions other than malignant neoplasm: Secondary | ICD-10-CM | POA: Diagnosis not present

## 2021-08-31 DIAGNOSIS — K219 Gastro-esophageal reflux disease without esophagitis: Secondary | ICD-10-CM | POA: Diagnosis not present

## 2021-08-31 DIAGNOSIS — G14 Postpolio syndrome: Secondary | ICD-10-CM | POA: Diagnosis not present

## 2021-08-31 DIAGNOSIS — E890 Postprocedural hypothyroidism: Secondary | ICD-10-CM | POA: Diagnosis not present

## 2021-08-31 DIAGNOSIS — Z87891 Personal history of nicotine dependence: Secondary | ICD-10-CM | POA: Diagnosis not present

## 2021-08-31 DIAGNOSIS — R609 Edema, unspecified: Secondary | ICD-10-CM | POA: Diagnosis not present

## 2021-08-31 DIAGNOSIS — I4891 Unspecified atrial fibrillation: Secondary | ICD-10-CM | POA: Diagnosis not present

## 2021-08-31 DIAGNOSIS — Z9181 History of falling: Secondary | ICD-10-CM | POA: Diagnosis not present

## 2021-08-31 DIAGNOSIS — J189 Pneumonia, unspecified organism: Secondary | ICD-10-CM | POA: Diagnosis not present

## 2021-08-31 DIAGNOSIS — M1990 Unspecified osteoarthritis, unspecified site: Secondary | ICD-10-CM | POA: Diagnosis not present

## 2021-08-31 DIAGNOSIS — Z7901 Long term (current) use of anticoagulants: Secondary | ICD-10-CM | POA: Diagnosis not present

## 2021-08-31 DIAGNOSIS — C184 Malignant neoplasm of transverse colon: Secondary | ICD-10-CM | POA: Diagnosis not present

## 2021-08-31 DIAGNOSIS — I10 Essential (primary) hypertension: Secondary | ICD-10-CM | POA: Diagnosis not present

## 2021-08-31 DIAGNOSIS — Z9049 Acquired absence of other specified parts of digestive tract: Secondary | ICD-10-CM | POA: Diagnosis not present

## 2021-08-31 DIAGNOSIS — Z79899 Other long term (current) drug therapy: Secondary | ICD-10-CM | POA: Diagnosis not present

## 2021-08-31 DIAGNOSIS — C183 Malignant neoplasm of hepatic flexure: Secondary | ICD-10-CM | POA: Diagnosis not present

## 2021-08-31 NOTE — Telephone Encounter (Signed)
Oral Oncology Patient Advocate Encounter  After completing a benefits investigation, prior authorization for Xeloda is not required at this time through [employer].  Patient's copay is $17.92.  [facility] [place] Phone [phone] Fax [phone] 08/31/2021 12:14 PM

## 2021-09-01 ENCOUNTER — Other Ambulatory Visit: Payer: Self-pay | Admitting: Psychiatry

## 2021-09-01 DIAGNOSIS — F250 Schizoaffective disorder, bipolar type: Secondary | ICD-10-CM

## 2021-09-03 ENCOUNTER — Telehealth: Payer: Self-pay | Admitting: *Deleted

## 2021-09-03 ENCOUNTER — Telehealth: Payer: Self-pay

## 2021-09-03 ENCOUNTER — Telehealth: Payer: Self-pay | Admitting: Pharmacist

## 2021-09-03 DIAGNOSIS — C184 Malignant neoplasm of transverse colon: Secondary | ICD-10-CM

## 2021-09-03 NOTE — Telephone Encounter (Signed)
-----   Message from Ladell Pier, MD sent at 08/30/2021  8:15 PM EDT ----- Please call patient, CEA is still mildly elevated, has long half life and may take longer to come down after recent surgery, repeat CEA next lab

## 2021-09-03 NOTE — Telephone Encounter (Signed)
Oral Oncology Pharmacist Encounter  Received new prescription for Xeloda (capecitabine) for the treatment of stage IIIb colon cancer in adjuvant, planned duration 3-6 months. Planned start 09/10/21  CMP from 08/30/21 assessed, no relevant lab abnormalities. Prescription dose and frequency assessed.   Current medication list in Epic reviewed, one relevant DDIs with capecitabine identified: Ziprasidone: category D interaction; ziprasidone may enhance the QTc-prolonging effect of capecitabine. She is on a low dose of ziprasidone. If ziprasidone is required, recommend checking a baseline QTc, then repeating a ECG 1-2 weeks into treatment.  Patient has been instructed by MD to stop pantoprazole and multivitamin  Evaluated chart and no patient barriers to medication adherence identified.   Prescription has been e-scribed to the [facility] for benefits analysis and approval.  Oral Oncology Clinic will continue to follow for insurance authorization, copayment issues, initial counseling and start date.  Patient agreed to treatment on 08/30/21 per MD documentation.  Darl Pikes, PharmD, BCPS, BCOP, CPP Hematology/Oncology Clinical Pharmacist Practitioner [facility] [phone]  09/03/2021 1:49 PM

## 2021-09-03 NOTE — Telephone Encounter (Signed)
TC to Pt about elevated CEA results. Pt also inquired about medication which I informed her pharmacy is working on it. Pt also asked if she could get another copy of the information on xeloda. Will mail out today. Pt.verbalized understanding.

## 2021-09-03 NOTE — Telephone Encounter (Signed)
Called patient and informed her that pharmacy review of her med list with capecitabine. Potential drug-drug interaction of  ziprasidone w/capecitabine of prolongation of QTc interval. Suggest baseline ECG this week prior to starting capecitabine on 10/24. Patient agrees. Scheduler will call her tomorrow to schedule time/date

## 2021-09-05 ENCOUNTER — Other Ambulatory Visit: Payer: Self-pay

## 2021-09-05 ENCOUNTER — Other Ambulatory Visit (HOSPITAL_COMMUNITY): Payer: Self-pay

## 2021-09-05 MED ORDER — CAPECITABINE 500 MG PO TABS
ORAL_TABLET | ORAL | 0 refills | Status: DC
  Filled 2021-09-05: qty 70, fill #0

## 2021-09-05 MED ORDER — CAPECITABINE 500 MG PO TABS
ORAL_TABLET | ORAL | 0 refills | Status: DC
  Filled 2021-09-05: qty 70, fill #0
  Filled 2021-09-06: qty 70, 21d supply, fill #0

## 2021-09-05 NOTE — Progress Notes (Signed)
The proposed treatment discussed in conference is for discussion purpose only and is not a binding recommendation.  The patients have not been physically examined, or presented with their treatment options.  Therefore, final treatment plans cannot be decided.  

## 2021-09-06 ENCOUNTER — Other Ambulatory Visit (HOSPITAL_COMMUNITY): Payer: Self-pay

## 2021-09-06 ENCOUNTER — Encounter: Payer: Self-pay | Admitting: Nurse Practitioner

## 2021-09-06 ENCOUNTER — Inpatient Hospital Stay: Payer: PPO

## 2021-09-06 ENCOUNTER — Inpatient Hospital Stay (HOSPITAL_BASED_OUTPATIENT_CLINIC_OR_DEPARTMENT_OTHER): Payer: PPO | Admitting: Nurse Practitioner

## 2021-09-06 ENCOUNTER — Encounter: Payer: Self-pay | Admitting: Psychiatry

## 2021-09-06 ENCOUNTER — Other Ambulatory Visit: Payer: Self-pay | Admitting: *Deleted

## 2021-09-06 ENCOUNTER — Ambulatory Visit (INDEPENDENT_AMBULATORY_CARE_PROVIDER_SITE_OTHER): Payer: PPO | Admitting: Psychiatry

## 2021-09-06 ENCOUNTER — Other Ambulatory Visit: Payer: Self-pay

## 2021-09-06 VITALS — BP 162/60 | HR 63 | Temp 97.8°F | Resp 18 | Ht 62.0 in | Wt 134.0 lb

## 2021-09-06 DIAGNOSIS — F99 Mental disorder, not otherwise specified: Secondary | ICD-10-CM | POA: Diagnosis not present

## 2021-09-06 DIAGNOSIS — C184 Malignant neoplasm of transverse colon: Secondary | ICD-10-CM | POA: Diagnosis not present

## 2021-09-06 DIAGNOSIS — G3184 Mild cognitive impairment, so stated: Secondary | ICD-10-CM

## 2021-09-06 DIAGNOSIS — F250 Schizoaffective disorder, bipolar type: Secondary | ICD-10-CM | POA: Diagnosis not present

## 2021-09-06 DIAGNOSIS — F5105 Insomnia due to other mental disorder: Secondary | ICD-10-CM | POA: Diagnosis not present

## 2021-09-06 DIAGNOSIS — C183 Malignant neoplasm of hepatic flexure: Secondary | ICD-10-CM | POA: Diagnosis not present

## 2021-09-06 MED ORDER — TEMAZEPAM 7.5 MG PO CAPS: 7.5000 mg | ORAL_CAPSULE | Freq: Every day | ORAL | 1 refills | Status: DC

## 2021-09-06 MED ORDER — PROMETHAZINE HCL 25 MG PO TABS: 12.5000 mg | ORAL_TABLET | Freq: Four times a day (QID) | ORAL | 1 refills | Status: DC | PRN

## 2021-09-06 MED ORDER — ZIPRASIDONE HCL 40 MG PO CAPS: 40.0000 mg | ORAL_CAPSULE | Freq: Every day | ORAL | 1 refills | Status: DC

## 2021-09-06 NOTE — Telephone Encounter (Addendum)
Oral Chemotherapy Pharmacist Encounter  Pt will start capecitabine on 09/10/21  I spoke with patient for overview of: Xeloda (capecitabine) for the adjuvant treatment of  stage IIIb colon cancer, planned duration 3-6 months.  Counseled patient on administration, dosing, side effects, monitoring, drug-food interactions, safe handling, storage, and disposal.  Patient will take Xeloda 500mg  tablets, 3 tablets (1500mg ) by mouth in AM and 2 tabs (1000mg ) by mouth in PM, with food, for 14 days on, 7 days off, repeated every 21 days.  Xeloda start date: 09/10/21 Filled through [facility]  Adverse effects include but are not limited to: fatigue, decreased blood counts, GI upset, diarrhea, mouth sores, and hand-foot syndrome.  Patient does not have anti-emetic on hand, but requests one be sent in. Pt knows to take it if nausea develops. There are concerns for DDIs with anti-emetics and her medication list. Ondansetron and prochlorperazine may prolong QTc, in addition to the risk with ziprasidone and capecitabine (risk category D). Promethazine may be considered as the DDIs are not as severe (risk category C), related to anticholinergic effects and CNS depression. Messaged MD to determine further steps related to antiemetic regimen.  Patient will obtain anti diarrheal and alert the office of 4 or more loose stools above baseline.  Reviewed with patient importance of keeping a medication schedule and plan for any missed doses. No barriers to medication adherence identified.  Medication reconciliation performed and medication/allergy list updated.  Patient informed the pharmacy will reach out 5-7 days prior to needing next fill of Xeloda to coordinate continued medication acquisition to prevent break in therapy.  After discussion, pt was concerned about timing of medication. I informed pt that goal is to be as close to 12 hours apart as possible, however, if needed pt can take  around 10 hours apart.   All questions answered.  Kaitlyn Hoover voiced understanding and appreciation.   Medication education handout placed in mail for patient. Patient knows to call the office with questions or concerns. Oral Chemotherapy Clinic phone number provided to patient.   Benn Moulder, PharmD Pharmacy Resident  09/06/2021 9:49 AM

## 2021-09-06 NOTE — Telephone Encounter (Signed)
Oral Chemotherapy Pharmacist Encounter   [facility] is shipping out medication today for delivery tomorrow 09/07/21.  Darl Pikes, PharmD, BCPS, BCOP, CPP Hematology/Oncology Clinical Pharmacist [facility] [phone]  09/06/2021 3:17 PM

## 2021-09-06 NOTE — Progress Notes (Signed)
Patient requesting anti-emetic for when she starts Xeloda. Pharmacy recommends promethazine. Script sent as requested.

## 2021-09-06 NOTE — Progress Notes (Signed)
  [facility] OFFICE PROGRESS NOTE   Diagnosis: Colon cancer  INTERVAL HISTORY:   Kaitlyn Hoover is a 80 year old woman recently diagnosed with stage IIIb colon cancer.  She is scheduled to begin adjuvant Xeloda 09/10/2021.  She is here today to complete a baseline EKG.  She requested evaluation of a "lump".  She reports a lump under the right breast for the past 3 weeks.  She describes it as "uncomfortable" not necessarily painful.  She thinks it is larger.  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  Objective:  Vital signs in last 24 hours:  Blood pressure (!) 162/60, pulse 63, temperature 97.8 F (36.6 C), temperature source Oral, resp. rate 18, height _0  (1.575 m), weight 134 lb (60.8 kg), SpO2 99 %.    GI: Abdomen soft and nontender.  No hepatomegaly.  Healed midline incision. Vascular: No leg edema. Skin: No rash. Musculoskeletal: Approximate 1.5 cm mobile ovoid subcutaneous lesion right lower chest inferior to the breast.  Similar type lesion palpable at the right lower abdomen  Lab Results:  Lab Results  Component Value Date   WBC 8.7 08/30/2021   HGB 10.2 (L) 08/30/2021   HCT 32.1 (L) 08/30/2021   MCV 94.1 08/30/2021   PLT 447 (H) 08/30/2021   NEUTROABS 5.9 08/30/2021    Imaging:  No results found.  Medications: I have reviewed the patient's current medications.  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 Tumor invades through the serosa, 3/25 lymph nodes, MSS, no loss of mismatch repair protein expression CTs 06/26/2021-eccentric wall thickening of the hepatic flexure, tiny pulmonary nodules measuring up to 2 mm-likely infectious or inflammatory Elevated preoperative CEA History of colon polyps including on the right colectomy specimen 08/15/2021 Bipolar disease Atrial fibrillation maintained on anticoagulation Subcutaneous lesion right lower chest inferior to the breast-possibly a  lipoma  Disposition: [name] appears stable.  She is scheduled to begin adjuvant Xeloda 09/10/2021.  She requested evaluation of a subcutaneous lesion located at the right lower chest.  By exam this appears consistent with a lipoma.  We will reevaluate at her next office visit.  She will contact the office in the interim with pain, redness, growth of the lesion.    Ned Card ANP/GNP-BC   09/06/2021  3:18 PM

## 2021-09-06 NOTE — Progress Notes (Signed)
Kaitlyn Hoover [id number] 04-15-1941 80 y.o.     Subjective:   Patient ID:  Kaitlyn Hoover is a 80 y.o. (DOB Mar 17, 1941) female.  Chief Complaint:  Chief Complaint  Patient presents with   Follow-up   Schizophrenia   Fatigue    Anxiety Patient reports no confusion, decreased concentration, dizziness, nervous/anxious behavior, palpitations or suicidal ideas.    Depression        Associated symptoms include fatigue.  Associated symptoms include no decreased concentration and no suicidal ideas.  Past medical history includes anxiety.   Kaitlyn Hoover presents for follow-up of psych issures  seen November 2020  and no meds were changed.  She called in August 2020  wanting to increase donepezil from 5 to 10 mg daily.  That was done.  03/28/20 appt with the following noted: Deep depression March.  No pcpt other than being confined.  No seasonal changes.  No real problems with depression in years.  Did worry about it.  Even had SI without plan.  Then got better.  Totally better now spontaneously.  She wonders about reducing ziprasidone. Doing OK overall.  Bored.  Not depressed.  Sleeping well with temazepam 30 mg with 8-9 hours.  Doing Zoom with church and looks forward to it.  Otherwise watching TV. Tolerated Aricept and has seen benefit for recall.  No paranoia or fear.   No med changes  06/26/20 TC stating: She's referring to the Temazepam 30 mg, she stopped on Thursday and hasn't slept in  4 days. She asked if eventually she would start sleeping without? I discussed that you would might recommend she go down to 15 mg, she really doesn't want to take it at all but will do as instructed. She does really she has to get some sleep. She's tried Benadryl but not helped either.  Following info was given to her: She has tapered off temazepam before but had to restart it.  She is tried alternatives clonazepam, Tranxene, trazodone, hydroxyzine.  She prefers to avoid meds that could have  weight gain.  She could probably stop temazepam more quickly and easily and still sleep if she took half of the 25 mg quetiapine but it may still give her a hangover. The way we tapered her off temazepam the last time was in the traditional manner of reducing the dose.  She still will have some insomnia but it likely is to be more bearable.  I would suggest we send in a prescription for 15 mg capsules and she take that for 2 to 4 weeks and she can try stopping again if she wants to at that point. If she stops temazepam cold [place] as she is doing currently she is likely to have pretty serious insomnia and very fragmented sleep that will not respond to Benadryl for a period of 2 to 4 weeks.  She has chronic insomnia which is the reason she is taken the temazepam and I am not sure what her sleep will be like once she gets through the withdrawal phase of stopping the temazepam. Pt via TC stated: Rtc to [facility] and she would prefer the 15 mg capsule of Temazepam, take 1 at hs. Rx called into her [facility] [address] Discussed her chronic insomnia and she verbalized understanding. Advised to call back with worsening condition or not effective for sleep.   07/05/20 appt with the following noted: Hell trying to stop temazepam 30 mg cold [place].  Couldn't sleep for days. Sleep is  fine on 15 mg HS however. No interest, not going to church since Pandemic.  Not worrying much about Covid.  Got vaccine.  Can't motivate herself to go get clothes or yard work.  Just sits and watches TV or reads.  Only social with friends once monthly.  Don't understand while not going to church.  Unmotivated and some sadness. No sig worry or fear other than about her mental health and no paranoia. Doctor said iron was low.  Recent UTI and ABx helped energy.  Donepezil has really helped with her memory she is well pleased.  Patient denies any suicidal ideation. Plan: Added lamotrigine for depression  09/07/20 appt with following  noted: More motivated and active and productive and less napping.  Big difference with addtion of lamotrigine.  Got benefit by 50 mg daily. NO SE. Depression resolved "definitely" No paranoia.  Nor fearfulness nor mood swings. Geodon and Fanapt doses are perfect.  Mild mouth movements but not severe. Sleep well with temazepam. Plan: no med changes  03/08/21 appt noted: Just not having any trouble.  Got me on the right medication.  Occ a little down but it's ok. No SE with lamotrigine. Had a fall and hurt back.  Not much better and had her down some.  Don't get out much. 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  Not going to church bc it's hard. Sleep OK No paranoia.  09/06/21 appt noted: Colon CA dx and surg 3 weeks ago. 3 positive nodes and started chemo soon. Been awful.  Depressed  since surgery.  Not really bad.  Feels drugged in AM until about 1 PM No change in meds.  Sleep 10 -8.    No insomnia.   Worried over chemo and how it will affect her.  Not worried over cancer. No paranoia since here.  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  Past Psychiatric Medication Trials: Olanzapine haloperidol, Abilify, Latuda, Saphris, Geodon 60 , Fanapt 2 mg,  Depakote 250 mg 3 times d aily, benztropine, Artane,  clonazepam side effects, Tranxene increased appetite, temazepam,  trazodone, hydroxyzine She has been under the care of this office since 2003.  Review of Systems:  Review of Systems  Constitutional:  Positive for fatigue.  HENT:         Jaw clenching stable.  Cardiovascular:  Negative for palpitations.  Gastrointestinal:  Positive for abdominal pain.  Musculoskeletal:  Positive for back pain and gait problem.  Neurological:  Positive for facial asymmetry and weakness. Negative for dizziness and tremors.  Psychiatric/Behavioral:  Negative for agitation, behavioral problems, confusion, decreased concentration, dysphoric mood, hallucinations, self-injury, sleep disturbance and  suicidal ideas. The patient is not nervous/anxious and is not hyperactive.    Medications: I have reviewed the patient's current medications.  Current Outpatient Medications  Medication Sig Dispense Refill   acyclovir (ZOVIRAX) 400 MG tablet Take 400 mg at bedtime by mouth.      alendronate (FOSAMAX) 70 MG tablet Take 70 mg by mouth every Sunday. Take with a full glass of water on an empty stomach.     Ascorbic Acid (VITAMIN C) 1000 MG tablet Take 1,000 mg by mouth daily.     azelastine (OPTIVAR) 0.05 % ophthalmic solution Place 2 drops into both eyes daily as needed. Inching eyes     b complex vitamins capsule Take 1 capsule by mouth daily.     Calcium Citrate-Vitamin D (CALCIUM CITRATE + D PO) Take 1,200 mg by mouth at bedtime.     [  START ON 09/10/2021] capecitabine (XELODA) 500 MG tablet Take 3 tablets (1500 mg) by mouth in AM and  2 tablets (1000 mg) in PM. Take with food. Take for 14 days, then hold for 7 days. Repeat every 21 days. 70 tablet 0   cholecalciferol (VITAMIN D) 25 MCG (1000 UNIT) tablet Take 1,000 Units by mouth daily.     Coenzyme Q10 (CO Q-10) 100 MG CAPS Take 100 mg by mouth daily.      D-Ribose (RIBOSE, D,) POWD Take 2 g by mouth daily.      diltiazem (CARDIZEM CD) 240 MG 24 hr capsule TAKE 1 CAPSULE BY MOUTH EVERY DAY 90 capsule 3   donepezil (ARICEPT) 10 MG tablet Take 1 tablet (10 mg total) by mouth daily. (Patient taking differently: Take 10 mg by mouth at bedtime.) 90 tablet 3   ELIQUIS 5 MG TABS tablet TAKE 1 TABLET BY MOUTH TWICE A DAY 180 tablet 1   ferrous sulfate 324 MG TBEC Take 324 mg by mouth daily with breakfast.     fexofenadine (ALLEGRA) 180 MG tablet Take 180 mg by mouth daily.     fluticasone (FLONASE) 50 MCG/ACT nasal spray Place 2 sprays into both nostrils daily.     GAVILAX 17 GM/SCOOP powder Take 17 g by mouth daily. Miralax     Ginkgo Biloba 120 MG TABS Take 240 mg by mouth daily.     iloperidone (FANAPT) 4 MG TABS tablet Take 1 tablet (4 mg total)  by mouth at bedtime. (Patient taking differently: Take 1 mg by mouth at bedtime.) 30 tablet 4   lamoTRIgine (LAMICTAL) 100 MG tablet TAKE 1 TABLET BY MOUTH EVERY DAY 90 tablet 0   levothyroxine (SYNTHROID, LEVOTHROID) 100 MCG tablet Take 100 mcg by mouth daily before breakfast.      MAGNESIUM PO Take 250 mg by mouth every evening.     Melatonin 3 MG TABS Take 3 mg by mouth at bedtime.     Omega-3 Fatty Acids (FISH OIL BURP-LESS) 1200 MG CAPS Take 1,200 mg by mouth daily.     oxybutynin (DITROPAN XL) 15 MG 24 hr tablet Take 15 mg by mouth daily.      Potassium Chloride ER 20 MEQ TBCR Take 1 tablet by mouth daily.     promethazine (PHENERGAN) 25 MG tablet Take 0.5-1 tablets (12.5-25 mg total) by mouth every 6 (six) hours as needed for nausea or vomiting. 30 tablet 1   rosuvastatin (CRESTOR) 10 MG tablet TAKE 1 TABLET UP TO 4 TIMES A WEEK ON SUNDAY, MONDAY, [facility] AND Friday. Please make overdue appt with Dr. Marlou Porch before anymore refills. Thank you 2nd attempt 48 tablet 2   sodium chloride (OCEAN) 0.65 % SOLN nasal spray Place 2 sprays into both nostrils every evening.     temazepam (RESTORIL) 15 MG capsule Take 1 capsule (15 mg total) by mouth at bedtime. 90 capsule 1   traMADol (ULTRAM) 50 MG tablet Take 1 tablet (50 mg total) by mouth 3 (three) times daily as needed. (Patient taking differently: Take 50 mg by mouth 3 (three) times daily as needed for severe pain or moderate pain.) 90 tablet 3   Turmeric 500 MG CAPS Take 1,000 mg by mouth daily.     vitamin B-12 (CYANOCOBALAMIN) 1000 MCG tablet Take 1,000 mcg by mouth daily.     ziprasidone (GEODON) 40 MG capsule TAKE 1 CAPSULE BY MOUTH EVERY DAY (Patient taking differently: Take 40 mg by mouth at bedtime.) 90 capsule 3  furosemide (LASIX) 20 MG tablet Take 20 mg by mouth daily. As needed (Patient not taking: Reported on 09/06/2021)     No current facility-administered medications for this visit.    Medication Side Effects: Other: EPS  prone.  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    Family History  Problem Relation Age of Onset   Hypertension Mother    Cancer Father        JAW BONE   COPD Brother    Atrial fibrillation Brother     Social History   Socioeconomic History   Marital status: Divorced    Spouse name: Not on file   Number of children: Not on file   Years of education: Not on file   Highest education level: Not on file  Occupational History   Not on file  Tobacco Use   Smoking status: Former    Packs/day: 1.00    Years: 25.00    Pack years: 25.00    Types: Cigarettes    Start date: 52    Quit date: 1998    Years since quitting: 24.8   Smokeless tobacco: Never   Tobacco comments:    QUIT SMOKING IN 1998  Vaping Use   Vaping Use: Never used  Substance and Sexual Activity   Alcohol use: Not Currently   Drug use: No   Sexual activity: Not on file  Other Topics Concern   Not on file  Social History Narrative   Not on file   Social Determinants of Health   Financial Resource Strain: Not on file  Food Insecurity: Not on file  Transportation Needs: Not on file  Physical Activity: Not on file  Stress: Not on file  Social Connections: Not on file  Intimate Partner Violence: Not on file    Past Medical History, Surgical history, Social history, and Family history were reviewed and updated as appropriate.   Please see review of systems for further details on the patient's review from  today.   Objective:   Physical Exam:  LMP  (LMP Unknown)   Physical Exam Constitutional:      General: She is not in acute distress. Musculoskeletal:        General: No deformity.  Neurological:     Mental Status: She is alert and oriented to person, place, and time.     Cranial Nerves: Dysarthria present.     Coordination: Coordination normal.     Comments: Mild jaw clenching  Psychiatric:        Attention and Perception: Attention and perception normal. She does not perceive auditory or visual hallucinations.        Mood and Affect: Mood is depressed. Mood is not anxious. Affect is not labile, blunt, angry or inappropriate.        Speech: Speech normal. Speech is not slurred.        Behavior: Behavior  normal. Behavior is not agitated or aggressive. Behavior is cooperative.        Thought Content: Thought content normal. Thought content is not paranoid or delusional. Thought content does not include homicidal or suicidal ideation. Thought content does not include homicidal or suicidal plan.        Cognition and Memory: Cognition and memory normal.        Judgment: Judgment normal.     Comments: History of paranoia in remission More down since surgery  history of paranoia controlled.   Lab Review:     Component Value Date/Time   NA 142 08/30/2021 1502   NA 142 11/02/2020 1146   Kaitlyn Hoover 3.7 08/30/2021 1502   CL 103 08/30/2021 1502   CO2 27 08/30/2021 1502   GLUCOSE 103 (H) 08/30/2021 1502   BUN 17 08/30/2021 1502   BUN 15 11/02/2020 1146   CREATININE 0.98 08/30/2021 1502   CALCIUM 9.2 08/30/2021 1502   PROT 6.8 08/30/2021 1502   PROT 7.2 02/12/2017 1013   ALBUMIN 3.9 08/30/2021 1502   ALBUMIN 4.0 02/12/2017 1013   AST 21 08/30/2021 1502   ALT 16 08/30/2021 1502   ALKPHOS 60 08/30/2021 1502   BILITOT 0.4 08/30/2021 1502   GFRNONAA 59 (L) 08/30/2021 1502   GFRAA 53 (L) 11/02/2020 1146       Component Value Date/Time   WBC 8.7 08/30/2021 1502   WBC 10.8 (H) 08/17/2021  0428   RBC 3.41 (L) 08/30/2021 1502   HGB 10.2 (L) 08/30/2021 1502   HGB 11.8 11/02/2020 1146   HCT 32.1 (L) 08/30/2021 1502   HCT 34.4 11/02/2020 1146   PLT 447 (H) 08/30/2021 1502   PLT 252 11/02/2020 1146   MCV 94.1 08/30/2021 1502   MCV 92 11/02/2020 1146   MCH 29.9 08/30/2021 1502   MCHC 31.8 08/30/2021 1502   RDW 13.9 08/30/2021 1502   RDW 12.0 11/02/2020 1146   LYMPHSABS 1.8 08/30/2021 1502   LYMPHSABS 1.8 04/11/2017 0911   MONOABS 0.7 08/30/2021 1502   EOSABS 0.1 08/30/2021 1502   EOSABS 0.2 04/11/2017 0911   BASOSABS 0.1 08/30/2021 1502   BASOSABS 0.1 04/11/2017 0911    No results found for: POCLITH, LITHIUM   No results found for: PHENYTOIN, PHENOBARB, VALPROATE, CBMZ   .res Assessment: Plan:    Schizoaffective disorder, bipolar type (Kaitlyn Hoover)  Insomnia due to other mental disorder  Mild cognitive impairment   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 It does not appear severe enough to warrant additional medication.  Greater than 50% of face to face time with patient was spent on counseling and coordination of care.  Kaitlyn Hoover has a long history of repeated episodes of paranoia associated with hypomania at times and at times independent of mood changes.  WE discussed her med-sensitivity.  As noted above she has tried multiple different antipsychotics and different antipsychotic combinations.  Discussed potential metabolic side effects associated with atypical antipsychotics, as well as potential risk for movement side effects. Advised pt to contact office if movement side effects occur.  She has mild dysarthria which may be related but it is no worse and it stable.  She is very specifically aware of the risk of tardive dyskinesia.   The unusual combination of antipsychotics is helpful in that the [place] actually offsets some of the dystonia that the Geodon tends to cause.  Lower dose of antipsychotics has led to worsening paranoia.  Her  paranoia is well controlled at  the current combination of medications.  Aricept 10 helped memory.  No SE. Still notices benefit.  Depression resolved with lamotrigine for the most part until surgery.  Defer increase. But will do so next time if still depressed.  Supportive therapy over dealing with cancer and mental health piece of it.  We discussed the short-term risks associated with benzodiazepines including sedation and increased fall risk among others.  Discussed long-term side effect risk including dependence, potential withdrawal symptoms, and the potential eventual dose-related risk of dementia.  But recent studies from 2020 dispute this association between benzodiazepines and dementia risk. Newer studies in 2020 do not support an association with dementia.  We have tried to taper her off benzodiazepines for sleep and she was able to be off for a short period of time but then had recurrence of insomnia.  She is aware of the risks but the benefit for sleep is noteworthy and she is failed multiple other sleep meds.   ok with reduced temazepam 15 mg HS. Would like try lower dose temazepam bc AM sedation and risk of lowering Geodon, Yes reduce temazepam to 7.5 mg HS Continue ziprasidone 40, Fanapt 4 mg 1/4 tablet daily, lamotrigine 100  She has had worsening paranoia at lower doses of ziprasidone in the past.  Follow-up 2 months  Lynder Parents MD, DFAPA   Please see After Visit Summary for patient specific instructions.  Future Appointments  Date Time Provider [place]  09/06/2021  2:15 PM [facility] None  09/27/2021 10:20 AM Ladell Pier, MD [facility] None  11/08/2021 10:30 AM Camnitz, Ocie Doyne, MD [facility]    No orders of the defined types were placed in this encounter.     -------------------------------

## 2021-09-07 DIAGNOSIS — C183 Malignant neoplasm of hepatic flexure: Secondary | ICD-10-CM | POA: Diagnosis not present

## 2021-09-07 DIAGNOSIS — I4891 Unspecified atrial fibrillation: Secondary | ICD-10-CM | POA: Diagnosis not present

## 2021-09-07 DIAGNOSIS — G14 Postpolio syndrome: Secondary | ICD-10-CM | POA: Diagnosis not present

## 2021-09-07 DIAGNOSIS — Z87891 Personal history of nicotine dependence: Secondary | ICD-10-CM | POA: Diagnosis not present

## 2021-09-07 DIAGNOSIS — Z9049 Acquired absence of other specified parts of digestive tract: Secondary | ICD-10-CM | POA: Diagnosis not present

## 2021-09-07 DIAGNOSIS — Z9181 History of falling: Secondary | ICD-10-CM | POA: Diagnosis not present

## 2021-09-07 DIAGNOSIS — K219 Gastro-esophageal reflux disease without esophagitis: Secondary | ICD-10-CM | POA: Diagnosis not present

## 2021-09-07 DIAGNOSIS — M1990 Unspecified osteoarthritis, unspecified site: Secondary | ICD-10-CM | POA: Diagnosis not present

## 2021-09-07 DIAGNOSIS — Z79899 Other long term (current) drug therapy: Secondary | ICD-10-CM | POA: Diagnosis not present

## 2021-09-07 DIAGNOSIS — I10 Essential (primary) hypertension: Secondary | ICD-10-CM | POA: Diagnosis not present

## 2021-09-07 DIAGNOSIS — Z7901 Long term (current) use of anticoagulants: Secondary | ICD-10-CM | POA: Diagnosis not present

## 2021-09-07 DIAGNOSIS — E890 Postprocedural hypothyroidism: Secondary | ICD-10-CM | POA: Diagnosis not present

## 2021-09-07 NOTE — Telephone Encounter (Signed)
Oral Oncology Patient Advocate Encounter  I spoke with Mrs Rymer on 09/06/21 to set up delivery of Xeloda.  Address verified for shipment.  Xeloda will be filled through [facility] and mailed 09/06/21 for delivery 09/07/21.  Patient to start medication on 09/10/21.    [place] will call 7-10 days before next refill is due to complete adherence call and set up delivery of medication.     [place] [facility] [place] Phone [phone] Fax [phone] 09/07/2021 10:46 AM

## 2021-09-13 ENCOUNTER — Other Ambulatory Visit (HOSPITAL_COMMUNITY): Payer: Self-pay

## 2021-09-17 ENCOUNTER — Other Ambulatory Visit (HOSPITAL_COMMUNITY): Payer: Self-pay

## 2021-09-17 ENCOUNTER — Other Ambulatory Visit: Payer: Self-pay | Admitting: Oncology

## 2021-09-17 DIAGNOSIS — C184 Malignant neoplasm of transverse colon: Secondary | ICD-10-CM

## 2021-09-18 ENCOUNTER — Other Ambulatory Visit (HOSPITAL_COMMUNITY): Payer: Self-pay

## 2021-09-19 ENCOUNTER — Other Ambulatory Visit (HOSPITAL_COMMUNITY): Payer: Self-pay

## 2021-09-20 ENCOUNTER — Other Ambulatory Visit (HOSPITAL_COMMUNITY): Payer: Self-pay

## 2021-09-20 MED ORDER — CAPECITABINE 500 MG PO TABS
ORAL_TABLET | ORAL | 0 refills | Status: DC
  Filled ????-??-??: fill #0

## 2021-09-21 ENCOUNTER — Other Ambulatory Visit: Payer: Self-pay | Admitting: Pharmacist

## 2021-09-21 ENCOUNTER — Other Ambulatory Visit (HOSPITAL_COMMUNITY): Payer: Self-pay

## 2021-09-21 DIAGNOSIS — C184 Malignant neoplasm of transverse colon: Secondary | ICD-10-CM

## 2021-09-21 MED ORDER — CAPECITABINE 500 MG PO TABS
ORAL_TABLET | ORAL | 0 refills | Status: DC
  Filled 2021-09-21: qty 70, 21d supply, fill #0

## 2021-09-24 ENCOUNTER — Other Ambulatory Visit (HOSPITAL_COMMUNITY): Payer: Self-pay

## 2021-09-25 DIAGNOSIS — D649 Anemia, unspecified: Secondary | ICD-10-CM | POA: Diagnosis not present

## 2021-09-27 ENCOUNTER — Other Ambulatory Visit (HOSPITAL_BASED_OUTPATIENT_CLINIC_OR_DEPARTMENT_OTHER): Payer: Self-pay

## 2021-09-27 ENCOUNTER — Other Ambulatory Visit: Payer: Self-pay

## 2021-09-27 ENCOUNTER — Inpatient Hospital Stay: Payer: PPO

## 2021-09-27 ENCOUNTER — Inpatient Hospital Stay: Payer: PPO | Attending: Oncology | Admitting: Oncology

## 2021-09-27 ENCOUNTER — Telehealth: Payer: Self-pay

## 2021-09-27 VITALS — BP 134/52 | HR 76 | Temp 98.2°F | Resp 18 | Ht 62.0 in | Wt 130.4 lb

## 2021-09-27 DIAGNOSIS — C184 Malignant neoplasm of transverse colon: Secondary | ICD-10-CM

## 2021-09-27 DIAGNOSIS — F319 Bipolar disorder, unspecified: Secondary | ICD-10-CM | POA: Diagnosis not present

## 2021-09-27 DIAGNOSIS — Z7901 Long term (current) use of anticoagulants: Secondary | ICD-10-CM | POA: Diagnosis not present

## 2021-09-27 DIAGNOSIS — C183 Malignant neoplasm of hepatic flexure: Secondary | ICD-10-CM | POA: Insufficient documentation

## 2021-09-27 DIAGNOSIS — I4891 Unspecified atrial fibrillation: Secondary | ICD-10-CM | POA: Diagnosis not present

## 2021-09-27 LAB — CMP (CANCER CENTER ONLY)
ALT: 10 U/L (ref 0–44)
AST: 22 U/L (ref 15–41)
Albumin: 4 g/dL (ref 3.5–5.0)
Alkaline Phosphatase: 66 U/L (ref 38–126)
Anion gap: 10 (ref 5–15)
BUN: 14 mg/dL (ref 8–23)
CO2: 31 mmol/L (ref 22–32)
Calcium: 9.6 mg/dL (ref 8.9–10.3)
Chloride: 100 mmol/L (ref 98–111)
Creatinine: 1.03 mg/dL — ABNORMAL HIGH (ref 0.44–1.00)
GFR, Estimated: 55 mL/min — ABNORMAL LOW (ref >60)
Glucose, Bld: 104 mg/dL — ABNORMAL HIGH (ref 70–99)
Potassium: 3 mmol/L — ABNORMAL LOW (ref 3.5–5.1)
Sodium: 141 mmol/L (ref 135–145)
Total Bilirubin: 0.8 mg/dL (ref 0.3–1.2)
Total Protein: 7.2 g/dL (ref 6.5–8.1)

## 2021-09-27 LAB — CBC WITH DIFFERENTIAL (CANCER CENTER ONLY)
Abs Immature Granulocytes: 0.01 10*3/uL (ref 0.00–0.07)
Basophils Absolute: 0 10*3/uL (ref 0.0–0.1)
Basophils Relative: 1 %
Eosinophils Absolute: 0.1 10*3/uL (ref 0.0–0.5)
Eosinophils Relative: 1 %
HCT: 34.7 % — ABNORMAL LOW (ref 36.0–46.0)
Hemoglobin: 11.3 g/dL — ABNORMAL LOW (ref 12.0–15.0)
Immature Granulocytes: 0 %
Lymphocytes Relative: 25 %
Lymphs Abs: 1.6 10*3/uL (ref 0.7–4.0)
MCH: 30.1 pg (ref 26.0–34.0)
MCHC: 32.6 g/dL (ref 30.0–36.0)
MCV: 92.3 fL (ref 80.0–100.0)
Monocytes Absolute: 0.5 10*3/uL (ref 0.1–1.0)
Monocytes Relative: 8 %
Neutro Abs: 4.2 10*3/uL (ref 1.7–7.7)
Neutrophils Relative %: 65 %
Platelet Count: 293 10*3/uL (ref 150–400)
RBC: 3.76 MIL/uL — ABNORMAL LOW (ref 3.87–5.11)
RDW: 16.4 % — ABNORMAL HIGH (ref 11.5–15.5)
WBC Count: 6.3 10*3/uL (ref 4.0–10.5)
nRBC: 0 % (ref 0.0–0.2)

## 2021-09-27 LAB — MAGNESIUM: Magnesium: 2 mg/dL (ref 1.7–2.4)

## 2021-09-27 LAB — CEA (ACCESS): CEA (CHCC): 10.19 ng/mL — ABNORMAL HIGH (ref 0.00–5.00)

## 2021-09-27 NOTE — Telephone Encounter (Signed)
TC to Pt per Dr Benay Spice to inform Pt that Dr Benay Spice would like her to take potassium 20 meq two times a day for 3 days and then 20 meq for 1 x a day until her next appointment. Pt verbalized understanding. Also lab called to add magnesium to labs ordered today.

## 2021-09-27 NOTE — Progress Notes (Signed)
  [place] OFFICE PROGRESS NOTE   Diagnosis: Colon cancer  INTERVAL HISTORY:   Ms. Thorp completed cycle 1 Xeloda beginning 09/10/2021.  No mouth sores.  She had a single episode of nausea and vomiting during the second week of Xeloda.  She had 1 episode of diarrhea each day for 2 days.  The diarrhea has resolved.  She reports a poor appetite since undergoing colon surgery.  Objective:  Vital signs in last 24 hours:  Blood pressure (!) 134/52, pulse 76, temperature 98.2 F (36.8 C), temperature source Oral, resp. rate 18, height $RemoveBe'5\' 2"'RhCPuabPA$  (1.575 m), weight 130 lb 6.4 oz (59.1 kg), SpO2 100 %.    HEENT: No thrush or ulcers Resp: Lungs clear bilaterally Cardio: Regular rate and rhythm GI: No hepatosplenomegaly Vascular: No leg edema  Skin: Palms without erythema, dryness of the soles, multiple lipomas over the upper abdomen and lower anterior chest  Lab Results:  Lab Results  Component Value Date   WBC 6.3 09/27/2021   HGB 11.3 (L) 09/27/2021   HCT 34.7 (L) 09/27/2021   MCV 92.3 09/27/2021   PLT 293 09/27/2021   NEUTROABS 4.2 09/27/2021    CMP  Lab Results  Component Value Date   NA 142 08/30/2021   K 3.7 08/30/2021   CL 103 08/30/2021   CO2 27 08/30/2021   GLUCOSE 103 (H) 08/30/2021   BUN 17 08/30/2021   CREATININE 0.98 08/30/2021   CALCIUM 9.2 08/30/2021   PROT 6.8 08/30/2021   ALBUMIN 3.9 08/30/2021   AST 21 08/30/2021   ALT 16 08/30/2021   ALKPHOS 60 08/30/2021   BILITOT 0.4 08/30/2021   GFRNONAA 59 (L) 08/30/2021   GFRAA 53 (L) 11/02/2020    Lab Results  Component Value Date   CEA1 5.4 (H) 08/06/2021   CEA 9.24 (H) 08/30/2021    Medications: I have reviewed the patient's current medications.   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 Tumor invades through the serosa, 3/25 lymph nodes, MSS, no loss of mismatch repair protein expression CTs 06/26/2021-eccentric  wall thickening of the hepatic flexure, tiny pulmonary nodules measuring up to 2 mm-likely infectious or inflammatory Elevated preoperative CEA Cycle 1 Xeloda 09/10/2021 Cycle 2 Xeloda 10/01/2021 History of colon polyps including on the right colectomy specimen 08/15/2021 Bipolar disease Atrial fibrillation maintained on anticoagulation Subcutaneous lesion right lower chest inferior to the breast-possibly a lipoma    Disposition: [name] has completed 1 cycle of adjuvant Xeloda.  She tolerated the Xeloda well aside from a single episode of nausea/vomiting and a few episodes of diarrhea.  She will begin cycle 2 on 10/01/2021.  She will call for increased diarrhea or new symptoms following this cycle of chemotherapy.  She will begin a potassium supplement.  The QTc interval is normal today.  She will return for an office visit in 3 weeks.  [name], MD  09/27/2021  10:42 AM

## 2021-10-10 DIAGNOSIS — R350 Frequency of micturition: Secondary | ICD-10-CM | POA: Diagnosis not present

## 2021-10-15 ENCOUNTER — Other Ambulatory Visit (HOSPITAL_COMMUNITY): Payer: Self-pay

## 2021-10-15 ENCOUNTER — Other Ambulatory Visit: Payer: Self-pay | Admitting: Oncology

## 2021-10-15 DIAGNOSIS — C184 Malignant neoplasm of transverse colon: Secondary | ICD-10-CM

## 2021-10-15 NOTE — Telephone Encounter (Signed)
Hold until appt on 10/18/21-per Dr. Benay Spice

## 2021-10-16 ENCOUNTER — Other Ambulatory Visit: Payer: Self-pay | Admitting: Oncology

## 2021-10-16 ENCOUNTER — Other Ambulatory Visit (HOSPITAL_COMMUNITY): Payer: Self-pay

## 2021-10-16 DIAGNOSIS — C184 Malignant neoplasm of transverse colon: Secondary | ICD-10-CM

## 2021-10-17 ENCOUNTER — Other Ambulatory Visit (HOSPITAL_COMMUNITY): Payer: Self-pay

## 2021-10-18 ENCOUNTER — Inpatient Hospital Stay: Payer: PPO | Attending: Nurse Practitioner | Admitting: Nurse Practitioner

## 2021-10-18 ENCOUNTER — Other Ambulatory Visit (HOSPITAL_COMMUNITY): Payer: Self-pay

## 2021-10-18 ENCOUNTER — Inpatient Hospital Stay: Payer: PPO

## 2021-10-18 ENCOUNTER — Encounter: Payer: Self-pay | Admitting: Nurse Practitioner

## 2021-10-18 ENCOUNTER — Other Ambulatory Visit: Payer: Self-pay

## 2021-10-18 VITALS — BP 141/55 | HR 73 | Temp 97.8°F | Resp 18 | Ht 62.0 in | Wt 129.0 lb

## 2021-10-18 DIAGNOSIS — Z9049 Acquired absence of other specified parts of digestive tract: Secondary | ICD-10-CM | POA: Insufficient documentation

## 2021-10-18 DIAGNOSIS — I4891 Unspecified atrial fibrillation: Secondary | ICD-10-CM | POA: Diagnosis not present

## 2021-10-18 DIAGNOSIS — C184 Malignant neoplasm of transverse colon: Secondary | ICD-10-CM | POA: Diagnosis not present

## 2021-10-18 DIAGNOSIS — C183 Malignant neoplasm of hepatic flexure: Secondary | ICD-10-CM | POA: Diagnosis not present

## 2021-10-18 DIAGNOSIS — R197 Diarrhea, unspecified: Secondary | ICD-10-CM | POA: Diagnosis not present

## 2021-10-18 DIAGNOSIS — Z8719 Personal history of other diseases of the digestive system: Secondary | ICD-10-CM | POA: Insufficient documentation

## 2021-10-18 DIAGNOSIS — Z7901 Long term (current) use of anticoagulants: Secondary | ICD-10-CM | POA: Insufficient documentation

## 2021-10-18 DIAGNOSIS — F319 Bipolar disorder, unspecified: Secondary | ICD-10-CM | POA: Diagnosis not present

## 2021-10-18 LAB — CMP (CANCER CENTER ONLY)
ALT: 10 U/L (ref 0–44)
AST: 21 U/L (ref 15–41)
Albumin: 4.1 g/dL (ref 3.5–5.0)
Alkaline Phosphatase: 62 U/L (ref 38–126)
Anion gap: 7 (ref 5–15)
BUN: 16 mg/dL (ref 8–23)
CO2: 28 mmol/L (ref 22–32)
Calcium: 9.4 mg/dL (ref 8.9–10.3)
Chloride: 108 mmol/L (ref 98–111)
Creatinine: 1.03 mg/dL — ABNORMAL HIGH (ref 0.44–1.00)
GFR, Estimated: 55 mL/min — ABNORMAL LOW (ref >60)
Glucose, Bld: 105 mg/dL — ABNORMAL HIGH (ref 70–99)
Potassium: 4 mmol/L (ref 3.5–5.1)
Sodium: 143 mmol/L (ref 135–145)
Total Bilirubin: 0.7 mg/dL (ref 0.3–1.2)
Total Protein: 6.4 g/dL — ABNORMAL LOW (ref 6.5–8.1)

## 2021-10-18 LAB — CBC WITH DIFFERENTIAL (CANCER CENTER ONLY)
Abs Immature Granulocytes: 0.03 10*3/uL (ref 0.00–0.07)
Basophils Absolute: 0 10*3/uL (ref 0.0–0.1)
Basophils Relative: 1 %
Eosinophils Absolute: 0.3 10*3/uL (ref 0.0–0.5)
Eosinophils Relative: 4 %
HCT: 34.4 % — ABNORMAL LOW (ref 36.0–46.0)
Hemoglobin: 11.1 g/dL — ABNORMAL LOW (ref 12.0–15.0)
Immature Granulocytes: 1 %
Lymphocytes Relative: 24 %
Lymphs Abs: 1.6 10*3/uL (ref 0.7–4.0)
MCH: 31.4 pg (ref 26.0–34.0)
MCHC: 32.3 g/dL (ref 30.0–36.0)
MCV: 97.5 fL (ref 80.0–100.0)
Monocytes Absolute: 0.5 10*3/uL (ref 0.1–1.0)
Monocytes Relative: 8 %
Neutro Abs: 4.1 10*3/uL (ref 1.7–7.7)
Neutrophils Relative %: 62 %
Platelet Count: 247 10*3/uL (ref 150–400)
RBC: 3.53 MIL/uL — ABNORMAL LOW (ref 3.87–5.11)
RDW: 21.3 % — ABNORMAL HIGH (ref 11.5–15.5)
WBC Count: 6.5 10*3/uL (ref 4.0–10.5)
nRBC: 0 % (ref 0.0–0.2)

## 2021-10-18 NOTE — Progress Notes (Signed)
  Kaitlyn Hoover   Diagnosis: Colon cancer  INTERVAL HISTORY:   Kaitlyn Hoover returns as scheduled.  She completed cycle 2 adjuvant Xeloda beginning 10/01/2021.  She denies nausea/vomiting.  No mouth sores.  No diarrhea.  No hand or foot pain or redness.  Some decrease in appetite.  No significant weight loss.  Objective:  Vital signs in last 24 hours:  Blood pressure (!) 141/55, pulse 73, temperature 97.8 F (36.6 C), temperature source Oral, resp. rate 18, height _0  (1.575 m), weight 129 lb (58.5 kg), SpO2 100 %.    HEENT: No thrush or ulcers. Resp: Lungs clear bilaterally. Cardio: Regular rate and rhythm. GI: Abdomen soft and nontender.  No hepatosplenomegaly. Vascular: No leg edema. Skin: Palms with mild hyperpigmentation and skin thickening.   Lab Results:  Lab Results  Component Value Date   WBC 6.5 10/18/2021   HGB 11.1 (L) 10/18/2021   HCT 34.4 (L) 10/18/2021   MCV 97.5 10/18/2021   PLT 247 10/18/2021   NEUTROABS 4.1 10/18/2021    Imaging:  No results found.  Medications: I have reviewed the patient's current medications.  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 Tumor invades through the serosa, 3/25 lymph nodes, MSS, no loss of mismatch repair protein expression CTs 06/26/2021-eccentric wall thickening of the hepatic flexure, tiny pulmonary nodules measuring up to 2 mm-likely infectious or inflammatory Elevated preoperative CEA Cycle 1 Xeloda 09/10/2021 Cycle 2 Xeloda 10/01/2021 Cycle 3 Xeloda 10/22/2021 History of colon polyps including on the right colectomy specimen 08/15/2021 Bipolar disease Atrial fibrillation maintained on anticoagulation Subcutaneous lesion right lower chest inferior to the breast-possibly a lipoma  Disposition: Kaitlyn Hoover appears stable.  She has completed 2 cycles of adjuvant Xeloda.  Thus far she is tolerating well.  Plan  to proceed with cycle 3 as scheduled beginning 10/22/2021.  We reviewed the expected 17-monthcourse of treatment.  CBC and chemistry panel reviewed.  Labs adequate to proceed as above.  She will return for lab and follow-up in 3 weeks.  We are available to see her sooner if needed.    LNed CardANP/GNP-BC   10/18/2021  1:42 PM

## 2021-10-19 ENCOUNTER — Other Ambulatory Visit (HOSPITAL_COMMUNITY): Payer: Self-pay

## 2021-10-19 MED ORDER — CAPECITABINE 500 MG PO TABS
ORAL_TABLET | ORAL | 0 refills | Status: DC
  Filled 2021-10-19: qty 70, 21d supply, fill #0

## 2021-10-22 ENCOUNTER — Other Ambulatory Visit (HOSPITAL_COMMUNITY): Payer: Self-pay

## 2021-10-30 ENCOUNTER — Other Ambulatory Visit: Payer: Self-pay | Admitting: Psychiatry

## 2021-10-30 DIAGNOSIS — F99 Mental disorder, not otherwise specified: Secondary | ICD-10-CM

## 2021-10-30 DIAGNOSIS — F5105 Insomnia due to other mental disorder: Secondary | ICD-10-CM

## 2021-11-05 ENCOUNTER — Other Ambulatory Visit: Payer: Self-pay | Admitting: Oncology

## 2021-11-05 ENCOUNTER — Other Ambulatory Visit (HOSPITAL_COMMUNITY): Payer: Self-pay

## 2021-11-05 DIAGNOSIS — C184 Malignant neoplasm of transverse colon: Secondary | ICD-10-CM

## 2021-11-05 MED ORDER — CAPECITABINE 500 MG PO TABS
ORAL_TABLET | ORAL | 0 refills | Status: DC
  Filled 2021-11-05: qty 70, 21d supply, fill #0

## 2021-11-07 ENCOUNTER — Other Ambulatory Visit (HOSPITAL_COMMUNITY): Payer: Self-pay

## 2021-11-07 NOTE — Progress Notes (Signed)
Electrophysiology Office Note   Date:  11/08/2021   ID:  Houston, Surges 15-Jun-1941, MRN [id number]  PCP:  London Pepper, MD  Cardiologist:  Marlou Porch Primary Electrophysiologist:  Trentan Trippe Meredith Leeds, MD    No chief complaint on file.    History of Present Illness: Kaitlyn Hoover is a 80 y.o. female who is being seen today for Kaitlyn evaluation of SVT, atrial fibrillation at Kaitlyn request of [name], MD. Presenting today for electrophysiology evaluation.   She has a history Of atrial fibrillation.  She has had multiple ER visits.  She also has SVT.  She had an atrial fibrillation ablation 04/25/2017.  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  Today, denies symptoms of palpitations, chest pain, shortness of breath, orthopnea, PND, lower extremity edema, claudication, dizziness, presyncope, syncope, bleeding, or neurologic sequela. Kaitlyn Hoover is tolerating medications without difficulties.  Is being seen she has done well.  She has no chest pain or shortness of breath.  Is able do all her daily activities.  She is overall happy with her control.  Unfortunately, she was diagnosed with colon cancer.  She has had a partial colectomy and lymph node biopsy.  She was positive on 3 of 25 lymph nodes.  She is on oral chemotherapy agent currently.   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   Past Surgical History:  Procedure Laterality Date   ABDOMINAL HYSTERECTOMY     ATRIAL FIBRILLATION ABLATION N/A 04/25/2017   Procedure: Atrial Fibrillation Ablation;  Surgeon: Constance Haw, MD;  Location: [facility];  Service: Cardiovascular;  Laterality:  N/A;   BREAST EXCISIONAL BIOPSY Bilateral    benign   CATARACT EXTRACTION W/ INTRAOCULAR LENS  IMPLANT, BILATERAL     CERVICAL LAMINECTOMY     CESAREAN SECTION     X2    LEGS      AGE [age]   SEVERAL SURGERIES FOR POLIO    SVT ABLATION N/A 11/26/2017   Procedure: SVT ABLATION;  Surgeon: Constance Haw, MD;  Location: [facility];  Service: Cardiovascular;  Laterality: N/A;   THYROIDECTOMY       Current Outpatient Medications  Medication Sig Dispense Refill   acyclovir (ZOVIRAX) 400 MG tablet Take 400 mg at bedtime by mouth.      alendronate (FOSAMAX) 70 MG tablet Take 70 mg by mouth every Sunday. Take with a full glass of water on an empty stomach.     Ascorbic Acid (VITAMIN C) 1000 MG tablet Take 1,000 mg by mouth daily.     azelastine (OPTIVAR) 0.05 % ophthalmic solution Place 2 drops into both eyes daily as needed. Inching eyes     b complex vitamins capsule Take 1 capsule by mouth daily.     Calcium Citrate-Vitamin D (CALCIUM CITRATE + D PO) Take 1,200 mg by mouth at bedtime.     capecitabine (XELODA) 500 MG tablet Take #3 tablets (1500 mg) every am and #2 tablets (1000 mg) every evening x 14 days on, then 7 day rest.  Repeat every 21 days. Start this cycle on 11/12/21 70 tablet 0   cholecalciferol (VITAMIN D) 25 MCG (1000 UNIT) tablet Take 1,000 Units by mouth daily.     Coenzyme Q10 (CO Q-10) 100 MG CAPS Take 100 mg by mouth daily.      D-Ribose (RIBOSE, D,) POWD Take 2 g by mouth daily.      diltiazem (CARDIZEM CD) 240 MG 24 hr capsule TAKE 1 CAPSULE BY MOUTH EVERY DAY 90 capsule 3   donepezil (ARICEPT) 10 MG tablet Take 1 tablet (10 mg total) by mouth daily. (Hoover taking differently: Take 10 mg by mouth at bedtime.) 90 tablet 3   ELIQUIS 5 MG TABS tablet TAKE 1 TABLET BY MOUTH TWICE A DAY 180 tablet 1   FANAPT 4 MG TABS tablet TAKE 1 TABLET BY MOUTH EVERYDAY AT BEDTIME 30 tablet 4   ferrous sulfate 324 MG TBEC Take 324 mg by mouth daily with breakfast.      fexofenadine (ALLEGRA) 180 MG tablet Take 180 mg by mouth daily.     fluticasone (FLONASE) 50 MCG/ACT nasal spray Place 2 sprays into both nostrils daily.     furosemide (LASIX) 20 MG tablet Take 20 mg by mouth daily. As needed     GAVILAX 17 GM/SCOOP powder Take 17 g by mouth daily. Miralax     Ginkgo Biloba 120 MG TABS Take 240 mg by mouth daily.     lamoTRIgine (LAMICTAL) 100 MG tablet TAKE 1 TABLET BY MOUTH EVERY DAY 90 tablet 0   levothyroxine (SYNTHROID, LEVOTHROID) 100 MCG tablet Take 100 mcg by mouth daily before breakfast.      MAGNESIUM PO Take 250 mg by mouth every evening.     Melatonin 3 MG TABS Take 3 mg by mouth at bedtime.     Omega-3 Fatty Acids (FISH OIL BURP-LESS) 1200 MG CAPS Take 1,200 mg by mouth daily.     oxybutynin (DITROPAN XL) 15 MG 24 hr tablet Take 15 mg by mouth daily.      Potassium Chloride ER 20 MEQ TBCR Take 1 tablet by mouth daily.     promethazine (PHENERGAN) 25 MG tablet Take 0.5-1 tablets (12.5-25 mg total) by mouth every 6 (six) hours as needed for nausea or vomiting. 30 tablet 1   rosuvastatin (CRESTOR) 10 MG tablet TAKE 1 TABLET UP TO 4 TIMES A WEEK ON SUNDAY, MONDAY, [facility] AND Friday. Please make overdue appt with Dr. Marlou Porch before anymore refills. Thank you 2nd attempt 48 tablet 2   sodium chloride (OCEAN) 0.65 % SOLN nasal spray Place 2 sprays into both nostrils every evening.     temazepam (RESTORIL) 7.5 MG capsule TAKE 1 CAPSULE (7.5 MG TOTAL) BY MOUTH AT BEDTIME. 30 capsule 1   traMADol (ULTRAM) 50 MG tablet Take 1 tablet (50 mg total) by mouth 3 (three) times daily as needed. (Hoover taking differently: Take 50 mg by mouth 3 (three) times daily as needed for severe pain or moderate pain.) 90 tablet 3   Turmeric 500 MG CAPS Take 1,000 mg by mouth daily.     vitamin B-12 (CYANOCOBALAMIN) 1000 MCG tablet Take 1,000 mcg by mouth daily.     ziprasidone (GEODON) 40 MG capsule Take 1 capsule (40 mg total) by mouth at bedtime. 90 capsule 1   No  current facility-administered medications for this visit.    Allergies:   Sulfa antibiotics, Ciprocinonide [fluocinolone], Ciprofibrate, and Ciprofloxacin   Social History:  Kaitlyn Hoover  reports that she quit smoking  about 24 years ago. Her smoking use included cigarettes. She started smoking about 50 years ago. She has a 25.00 pack-year smoking history. She has never used smokeless tobacco. She reports that she does not currently use alcohol. She reports that she does not use drugs.   Family History:  Kaitlyn Hoover's family history includes Atrial fibrillation in her brother; COPD in her brother; Cancer in her father; Hypertension in her mother.   ROS:  Please see Kaitlyn history of present illness.   Otherwise, review of systems is positive for none.   All other systems are reviewed and negative.   PHYSICAL EXAM: VS:  BP (!) 114/40    Pulse 90    Ht 5\' 2"  (1.575 m)    Wt 126 lb (57.2 kg)    LMP  (LMP Unknown)    SpO2 97%    BMI 23.05 kg/m  , BMI Body mass index is 23.05 kg/m. GEN: Well nourished, well developed, in no acute distress  HEENT: normal  Neck: no JVD, carotid bruits, or masses Cardiac: RRR; no murmurs, rubs, or gallops,no edema  Respiratory:  clear to auscultation bilaterally, normal work of breathing GI: soft, nontender, nondistended, + BS MS: no deformity or atrophy  Skin: warm and dry Neuro:  Strength and sensation are intact Psych: euthymic mood, full affect  EKG:  EKG is not ordered today. Personal review of Kaitlyn ekg ordered 08/28/21 shows sinus rhythm, rate 57  Recent Labs: 09/27/2021: Magnesium 2.0 10/18/2021: ALT 10; BUN 16; Creatinine 1.03; Hemoglobin 11.1; Platelet Count 247; Potassium 4.0; Sodium 143    Lipid Panel     Component Value Date/Time   CHOL 136 02/12/2017 1013   TRIG 114 02/12/2017 1013   HDL 46 02/12/2017 1013   CHOLHDL 3.0 02/12/2017 1013   CHOLHDL 3.5 10/22/2016 1039   VLDL 19 10/22/2016 1039   LDLCALC 67 02/12/2017 1013     Wt Readings  from Last 3 Encounters:  11/08/21 126 lb (57.2 kg)  10/18/21 129 lb (58.5 kg)  09/27/21 130 lb 6.4 oz (59.1 kg)      Other studies Reviewed: Additional studies/ records that were reviewed today include: TTE 2015  Review of Kaitlyn above records today demonstrates:  - Left ventricle: Kaitlyn cavity size was normal. Wall thickness was   normal. Systolic function was normal. Kaitlyn estimated ejection   fraction was in Kaitlyn range of 60% to 65%. Wall motion was normal;   there were no regional wall motion abnormalities. - Left atrium: Kaitlyn atrium was mildly dilated. - Atrial septum: No defect or patent foramen ovale was identified. - Pulmonary arteries: PA peak pressure: 32 mm Hg (S).  30 day monitor 01/08/18 - personally reviewed Sinus rhythm with occasional PACs Possible atrial fibrillation with artifact affecting interpretation  ASSESSMENT AND PLAN:  1.  Paroxysmal atrial fibrillation: Currently on Eliquis.  Status post ablation 03/25/2017 without obvious recurrence.  CHA2DS2-VASc of 5.  She is overall comfortable with Kaitlyn way she has been feeling in her control.  No changes.  2.  Hyperlipidemia: Plan per primary physician.  3.  Coronary artery disease: No current chest pain.  Continue Crestor 10 mg.  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  No obvious recurrence.  5.  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  No changes.   Current medicines are reviewed at length with Kaitlyn Hoover today.   Kaitlyn Hoover does not have concerns regarding her medicines.  Kaitlyn following changes were made today: none  Labs/ tests  ordered today include:  No orders of Kaitlyn defined types were placed in this encounter.    Disposition:   FU with Thekla Colborn 12 months  Signed, Kada Friesen Meredith Leeds, MD  11/08/2021 10:39 AM     [facility] [address] [place]  [place] [address] [phone] (office) [phone] (fax)

## 2021-11-08 ENCOUNTER — Other Ambulatory Visit: Payer: Self-pay

## 2021-11-08 ENCOUNTER — Ambulatory Visit (INDEPENDENT_AMBULATORY_CARE_PROVIDER_SITE_OTHER): Payer: PPO | Admitting: Cardiology

## 2021-11-08 ENCOUNTER — Encounter: Payer: Self-pay | Admitting: Cardiology

## 2021-11-08 VITALS — BP 114/40 | HR 90 | Ht 62.0 in | Wt 126.0 lb

## 2021-11-08 DIAGNOSIS — I480 Paroxysmal atrial fibrillation: Secondary | ICD-10-CM | POA: Diagnosis not present

## 2021-11-09 ENCOUNTER — Other Ambulatory Visit: Payer: Self-pay

## 2021-11-09 ENCOUNTER — Encounter: Payer: Self-pay | Admitting: Oncology

## 2021-11-09 ENCOUNTER — Inpatient Hospital Stay (HOSPITAL_BASED_OUTPATIENT_CLINIC_OR_DEPARTMENT_OTHER): Payer: PPO | Admitting: Oncology

## 2021-11-09 ENCOUNTER — Inpatient Hospital Stay: Payer: PPO

## 2021-11-09 ENCOUNTER — Other Ambulatory Visit: Payer: Self-pay | Admitting: *Deleted

## 2021-11-09 ENCOUNTER — Other Ambulatory Visit: Payer: Self-pay | Admitting: Cardiology

## 2021-11-09 VITALS — BP 143/50 | HR 96 | Temp 98.1°F | Resp 20 | Ht 62.0 in | Wt 125.0 lb

## 2021-11-09 DIAGNOSIS — C184 Malignant neoplasm of transverse colon: Secondary | ICD-10-CM

## 2021-11-09 DIAGNOSIS — C183 Malignant neoplasm of hepatic flexure: Secondary | ICD-10-CM | POA: Diagnosis not present

## 2021-11-09 LAB — CMP (CANCER CENTER ONLY)
ALT: 12 U/L (ref 0–44)
AST: 20 U/L (ref 15–41)
Albumin: 3.9 g/dL (ref 3.5–5.0)
Alkaline Phosphatase: 62 U/L (ref 38–126)
Anion gap: 10 (ref 5–15)
BUN: 26 mg/dL — ABNORMAL HIGH (ref 8–23)
CO2: 23 mmol/L (ref 22–32)
Calcium: 8.9 mg/dL (ref 8.9–10.3)
Chloride: 105 mmol/L (ref 98–111)
Creatinine: 1.15 mg/dL — ABNORMAL HIGH (ref 0.44–1.00)
GFR, Estimated: 48 mL/min — ABNORMAL LOW (ref >60)
Glucose, Bld: 113 mg/dL — ABNORMAL HIGH (ref 70–99)
Potassium: 3.9 mmol/L (ref 3.5–5.1)
Sodium: 138 mmol/L (ref 135–145)
Total Bilirubin: 1.5 mg/dL — ABNORMAL HIGH (ref 0.3–1.2)
Total Protein: 6.4 g/dL — ABNORMAL LOW (ref 6.5–8.1)

## 2021-11-09 LAB — CBC WITH DIFFERENTIAL (CANCER CENTER ONLY)
Abs Immature Granulocytes: 0.01 10*3/uL (ref 0.00–0.07)
Basophils Absolute: 0 10*3/uL (ref 0.0–0.1)
Basophils Relative: 0 %
Eosinophils Absolute: 0.1 10*3/uL (ref 0.0–0.5)
Eosinophils Relative: 1 %
HCT: 36.5 % (ref 36.0–46.0)
Hemoglobin: 12.1 g/dL (ref 12.0–15.0)
Immature Granulocytes: 0 %
Lymphocytes Relative: 12 %
Lymphs Abs: 0.8 10*3/uL (ref 0.7–4.0)
MCH: 33.3 pg (ref 26.0–34.0)
MCHC: 33.2 g/dL (ref 30.0–36.0)
MCV: 100.6 fL — ABNORMAL HIGH (ref 80.0–100.0)
Monocytes Absolute: 0.8 10*3/uL (ref 0.1–1.0)
Monocytes Relative: 12 %
Neutro Abs: 4.8 10*3/uL (ref 1.7–7.7)
Neutrophils Relative %: 75 %
Platelet Count: 239 10*3/uL (ref 150–400)
RBC: 3.63 MIL/uL — ABNORMAL LOW (ref 3.87–5.11)
RDW: 25.4 % — ABNORMAL HIGH (ref 11.5–15.5)
WBC Count: 6.5 10*3/uL (ref 4.0–10.5)
nRBC: 0 % (ref 0.0–0.2)

## 2021-11-09 LAB — CEA (ACCESS): CEA (CHCC): 13.19 ng/mL — ABNORMAL HIGH (ref 0.00–5.00)

## 2021-11-09 MED ORDER — ONDANSETRON HCL 8 MG PO TABS: 8.0000 mg | ORAL_TABLET | Freq: Three times a day (TID) | ORAL | 0 refills | Status: DC | PRN

## 2021-11-09 NOTE — Progress Notes (Signed)
[facility] OFFICE PROGRESS NOTE   Diagnosis: Colon cancer  INTERVAL HISTORY:   Ms. Voong began another cycle of Xeloda on 10/22/2021.  No mouth sores or hand/foot pain.  She had 3 episodes of nausea and vomiting beginning 11/05/2021.  She reports 4 episodes of diarrhea this week.  The nausea has resolved.  Objective:  Vital signs in last 24 hours:  Blood pressure (!) 143/50, pulse 96, temperature 98.1 F (36.7 C), temperature source Oral, resp. rate 20, height _0  (1.575 m), weight 125 lb (56.7 kg), SpO2 100 %.    HEENT: No thrush or ulcers Resp: Lungs clear bilaterally Cardio: Regular rate and rhythm GI: Soft and nontender, no hepatosplenomegaly, no mass Vascular: No leg edema  Skin: Palms without erythema.  Dryness at the right sole  Lab Results:  Lab Results  Component Value Date   WBC 6.5 11/09/2021   HGB 12.1 11/09/2021   HCT 36.5 11/09/2021   MCV 100.6 (H) 11/09/2021   PLT 239 11/09/2021   NEUTROABS 4.8 11/09/2021    CMP  Lab Results  Component Value Date   NA 138 11/09/2021   K 3.9 11/09/2021   CL 105 11/09/2021   CO2 23 11/09/2021   GLUCOSE 113 (H) 11/09/2021   BUN 26 (H) 11/09/2021   CREATININE 1.15 (H) 11/09/2021   CALCIUM 8.9 11/09/2021   PROT 6.4 (L) 11/09/2021   ALBUMIN 3.9 11/09/2021   AST 20 11/09/2021   ALT 12 11/09/2021   ALKPHOS 62 11/09/2021   BILITOT 1.5 (H) 11/09/2021   GFRNONAA 48 (L) 11/09/2021   GFRAA 53 (L) 11/02/2020    Lab Results  Component Value Date   CEA1 5.4 (H) 08/06/2021   CEA 10.19 (H) 09/27/2021   Medications: I have reviewed the patient's current medications.   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 Tumor invades through the serosa, 3/25 lymph nodes, MSS, no loss of mismatch repair protein expression CTs 06/26/2021-eccentric wall thickening of the hepatic flexure, tiny pulmonary nodules measuring up to 2 mm-likely  infectious or inflammatory Elevated preoperative CEA Cycle 1 Xeloda 09/10/2021 Cycle 2 Xeloda 10/01/2021 Cycle 3 Xeloda 10/22/2021 Cycle 4 Xeloda 11/12/2021 History of colon polyps including on the right colectomy specimen 08/15/2021 Bipolar disease Atrial fibrillation maintained on anticoagulation Subcutaneous lesion right lower chest inferior to the breast-possibly a lipoma    Disposition: Ms. Tiano has completed 3 cycles of Xeloda.  It is unclear whether the episode of nausea/vomiting and diarrhea this week was related to Xeloda.  Her symptoms resolved quickly and occurred at the very end of the Xeloda cycle.  The plan is to begin another cycle of Xeloda 11/12/2021.  She will use Zofran as needed for nausea.  She will call for nausea following this cycle of chemotherapy.  The CEA was mildly elevated when she was here on 09/27/2021.  We will follow-up on the CEA from today.  We will refer her for restaging CTs for a consistent rise in the CEA.  She will return for an office and lab visit in 3 weeks.  The QT interval is normal today.  [name], MD  11/09/2021  11:23 AM

## 2021-11-16 ENCOUNTER — Telehealth: Payer: Self-pay

## 2021-11-16 NOTE — Telephone Encounter (Signed)
TC from Pt stating that she has been vomiting all night and had 3 bouts of  diarrhea. Pt stated she has stopped her xeloda and wanted to know what she should do. Asked Pt if diarrhea has stopped. Pt stated she is taking imodium. Informed Pt that she should get some Gatorade or pedialyte informed Pt that there is a 24 hour triage line if her symptoms should get worse.

## 2021-11-18 DIAGNOSIS — J9601 Acute respiratory failure with hypoxia: Secondary | ICD-10-CM | POA: Diagnosis not present

## 2021-11-18 DIAGNOSIS — K56609 Unspecified intestinal obstruction, unspecified as to partial versus complete obstruction: Secondary | ICD-10-CM | POA: Diagnosis not present

## 2021-11-18 DIAGNOSIS — N179 Acute kidney failure, unspecified: Secondary | ICD-10-CM | POA: Diagnosis not present

## 2021-11-18 DIAGNOSIS — K521 Toxic gastroenteritis and colitis: Secondary | ICD-10-CM | POA: Diagnosis not present

## 2021-11-18 DIAGNOSIS — R0602 Shortness of breath: Secondary | ICD-10-CM | POA: Diagnosis not present

## 2021-11-18 DIAGNOSIS — R197 Diarrhea, unspecified: Secondary | ICD-10-CM | POA: Diagnosis not present

## 2021-11-18 DIAGNOSIS — E860 Dehydration: Secondary | ICD-10-CM | POA: Diagnosis not present

## 2021-11-18 DIAGNOSIS — F209 Schizophrenia, unspecified: Secondary | ICD-10-CM | POA: Diagnosis not present

## 2021-11-18 DIAGNOSIS — R918 Other nonspecific abnormal finding of lung field: Secondary | ICD-10-CM | POA: Diagnosis not present

## 2021-11-18 DIAGNOSIS — E871 Hypo-osmolality and hyponatremia: Secondary | ICD-10-CM | POA: Diagnosis not present

## 2021-11-18 DIAGNOSIS — K5790 Diverticulosis of intestine, part unspecified, without perforation or abscess without bleeding: Secondary | ICD-10-CM | POA: Diagnosis not present

## 2021-11-18 DIAGNOSIS — K567 Ileus, unspecified: Secondary | ICD-10-CM | POA: Diagnosis not present

## 2021-11-18 DIAGNOSIS — Z9049 Acquired absence of other specified parts of digestive tract: Secondary | ICD-10-CM | POA: Diagnosis not present

## 2021-11-18 DIAGNOSIS — I480 Paroxysmal atrial fibrillation: Secondary | ICD-10-CM | POA: Diagnosis not present

## 2021-11-18 DIAGNOSIS — Z85038 Personal history of other malignant neoplasm of large intestine: Secondary | ICD-10-CM | POA: Diagnosis not present

## 2021-11-18 DIAGNOSIS — Z7901 Long term (current) use of anticoagulants: Secondary | ICD-10-CM | POA: Diagnosis not present

## 2021-11-18 DIAGNOSIS — D7589 Other specified diseases of blood and blood-forming organs: Secondary | ICD-10-CM | POA: Diagnosis not present

## 2021-11-18 DIAGNOSIS — F319 Bipolar disorder, unspecified: Secondary | ICD-10-CM | POA: Diagnosis not present

## 2021-11-18 DIAGNOSIS — I10 Essential (primary) hypertension: Secondary | ICD-10-CM | POA: Diagnosis not present

## 2021-11-18 DIAGNOSIS — T451X5A Adverse effect of antineoplastic and immunosuppressive drugs, initial encounter: Secondary | ICD-10-CM | POA: Diagnosis not present

## 2021-11-18 DIAGNOSIS — Z136 Encounter for screening for cardiovascular disorders: Secondary | ICD-10-CM | POA: Diagnosis not present

## 2021-11-18 DIAGNOSIS — E440 Moderate protein-calorie malnutrition: Secondary | ICD-10-CM | POA: Diagnosis not present

## 2021-11-18 DIAGNOSIS — C189 Malignant neoplasm of colon, unspecified: Secondary | ICD-10-CM | POA: Diagnosis not present

## 2021-11-18 DIAGNOSIS — K529 Noninfective gastroenteritis and colitis, unspecified: Secondary | ICD-10-CM | POA: Diagnosis not present

## 2021-11-18 DIAGNOSIS — E8770 Fluid overload, unspecified: Secondary | ICD-10-CM | POA: Diagnosis not present

## 2021-11-18 DIAGNOSIS — R112 Nausea with vomiting, unspecified: Secondary | ICD-10-CM | POA: Diagnosis not present

## 2021-11-18 DIAGNOSIS — R531 Weakness: Secondary | ICD-10-CM | POA: Diagnosis not present

## 2021-11-19 DIAGNOSIS — R197 Diarrhea, unspecified: Secondary | ICD-10-CM | POA: Diagnosis not present

## 2021-11-19 DIAGNOSIS — E440 Moderate protein-calorie malnutrition: Secondary | ICD-10-CM | POA: Diagnosis not present

## 2021-11-19 DIAGNOSIS — R112 Nausea with vomiting, unspecified: Secondary | ICD-10-CM | POA: Diagnosis not present

## 2021-11-19 DIAGNOSIS — C189 Malignant neoplasm of colon, unspecified: Secondary | ICD-10-CM | POA: Diagnosis not present

## 2021-11-19 DIAGNOSIS — E871 Hypo-osmolality and hyponatremia: Secondary | ICD-10-CM | POA: Diagnosis not present

## 2021-11-20 DIAGNOSIS — R112 Nausea with vomiting, unspecified: Secondary | ICD-10-CM | POA: Diagnosis not present

## 2021-11-20 DIAGNOSIS — R197 Diarrhea, unspecified: Secondary | ICD-10-CM | POA: Diagnosis not present

## 2021-11-21 ENCOUNTER — Telehealth: Payer: Self-pay | Admitting: *Deleted

## 2021-11-21 DIAGNOSIS — K56609 Unspecified intestinal obstruction, unspecified as to partial versus complete obstruction: Secondary | ICD-10-CM | POA: Diagnosis not present

## 2021-11-21 NOTE — Telephone Encounter (Signed)
Oncology Discharge Planning Admission Note  [facility] Address: [place], [place], [place] [address] Hours of Operation:  8am - 5pm, Monday - Friday  Clinic Contact Information:  [phone]  Oncology Care Team: Medical Oncologist:  Dr. [name]  Contacted son, Corene Cornea to inform that the oncology provider Dr. Benay Spice is aware of this hospital admission dated 11/18/2021 in [place], and the cancer center will follow Marella Bile inpatient care to assist with discharge planning as indicated by the oncologist. Follow up as scheduled unless this needs to be rescheduled. He plans to keep her with him for 2 days after discharge and then bring her home. Going in for another scan today due to abdominal distention and vomiting on full liquid diet. Back to clear liquids at this time. Surgery is following as well. Disclaimer:  This [place] note does not imply a formal consult request has been made by the admitting attending for this admission or there will be an inpatient consult completed by oncology.  Please request oncology consults as per standard process as indicated.

## 2021-11-22 ENCOUNTER — Other Ambulatory Visit (HOSPITAL_COMMUNITY): Payer: Self-pay

## 2021-11-22 DIAGNOSIS — K56609 Unspecified intestinal obstruction, unspecified as to partial versus complete obstruction: Secondary | ICD-10-CM | POA: Diagnosis not present

## 2021-11-23 DIAGNOSIS — K56609 Unspecified intestinal obstruction, unspecified as to partial versus complete obstruction: Secondary | ICD-10-CM | POA: Diagnosis not present

## 2021-11-23 DIAGNOSIS — R112 Nausea with vomiting, unspecified: Secondary | ICD-10-CM | POA: Diagnosis not present

## 2021-11-23 DIAGNOSIS — R197 Diarrhea, unspecified: Secondary | ICD-10-CM | POA: Diagnosis not present

## 2021-11-24 DIAGNOSIS — K56609 Unspecified intestinal obstruction, unspecified as to partial versus complete obstruction: Secondary | ICD-10-CM | POA: Diagnosis not present

## 2021-11-26 ENCOUNTER — Telehealth: Payer: Self-pay

## 2021-11-26 ENCOUNTER — Other Ambulatory Visit: Payer: Self-pay | Admitting: Cardiology

## 2021-11-26 NOTE — Telephone Encounter (Signed)
Prescription faxed to [employer] [facility] # [phone] for a light weight folding wheelchair.

## 2021-11-26 NOTE — Telephone Encounter (Signed)
Called and spoke with the patient to see if she is still weak. Patient stated she is very weak and that she would like to come in to see Dr Benay Spice. Patient have appointment on 11/29/21, patient stated she will be in on the 11/29/21. Patient stated she is being resting and take in fluids. Patient also stated she needs a wheelchair.

## 2021-11-27 ENCOUNTER — Telehealth: Payer: Self-pay

## 2021-11-27 NOTE — Telephone Encounter (Signed)
Called and spoke with the patient to let her know that we are working with [employer] [facility] getting her wheelchair order.  I remained the patient she have an appointment on 1/12 at the appointment her and the provider will discuss more on the wheelchair. She states her son will be bring her and she will try her best to get here. Patient voice understanding

## 2021-11-28 ENCOUNTER — Telehealth: Payer: Self-pay

## 2021-11-28 DIAGNOSIS — R531 Weakness: Secondary | ICD-10-CM | POA: Diagnosis not present

## 2021-11-28 DIAGNOSIS — I4891 Unspecified atrial fibrillation: Secondary | ICD-10-CM | POA: Diagnosis not present

## 2021-11-28 DIAGNOSIS — E860 Dehydration: Secondary | ICD-10-CM | POA: Diagnosis not present

## 2021-11-28 DIAGNOSIS — C189 Malignant neoplasm of colon, unspecified: Secondary | ICD-10-CM | POA: Diagnosis not present

## 2021-11-28 NOTE — Telephone Encounter (Signed)
Called and spoke with Seth Bake at  [facility] to let her know the patient will be in on 09/29/22 to see  Elby Showers. Marcello Moores and she will note about the wheelchair request in her narrative note

## 2021-11-29 ENCOUNTER — Encounter: Payer: Self-pay | Admitting: Nurse Practitioner

## 2021-11-29 ENCOUNTER — Other Ambulatory Visit (HOSPITAL_COMMUNITY): Payer: Self-pay

## 2021-11-29 ENCOUNTER — Inpatient Hospital Stay (HOSPITAL_BASED_OUTPATIENT_CLINIC_OR_DEPARTMENT_OTHER): Payer: PPO | Admitting: Nurse Practitioner

## 2021-11-29 ENCOUNTER — Inpatient Hospital Stay: Payer: PPO | Attending: Nurse Practitioner

## 2021-11-29 ENCOUNTER — Other Ambulatory Visit: Payer: Self-pay

## 2021-11-29 VITALS — BP 107/58 | HR 82 | Temp 98.1°F | Resp 19 | Ht 62.0 in | Wt 121.1 lb

## 2021-11-29 DIAGNOSIS — Z7901 Long term (current) use of anticoagulants: Secondary | ICD-10-CM | POA: Insufficient documentation

## 2021-11-29 DIAGNOSIS — K56609 Unspecified intestinal obstruction, unspecified as to partial versus complete obstruction: Secondary | ICD-10-CM | POA: Insufficient documentation

## 2021-11-29 DIAGNOSIS — C184 Malignant neoplasm of transverse colon: Secondary | ICD-10-CM | POA: Diagnosis not present

## 2021-11-29 DIAGNOSIS — F319 Bipolar disorder, unspecified: Secondary | ICD-10-CM | POA: Diagnosis not present

## 2021-11-29 DIAGNOSIS — I4891 Unspecified atrial fibrillation: Secondary | ICD-10-CM | POA: Insufficient documentation

## 2021-11-29 DIAGNOSIS — C183 Malignant neoplasm of hepatic flexure: Secondary | ICD-10-CM | POA: Insufficient documentation

## 2021-11-29 LAB — CMP (CANCER CENTER ONLY)
ALT: 26 U/L (ref 0–44)
AST: 33 U/L (ref 15–41)
Albumin: 3 g/dL — ABNORMAL LOW (ref 3.5–5.0)
Alkaline Phosphatase: 83 U/L (ref 38–126)
Anion gap: 10 (ref 5–15)
BUN: 9 mg/dL (ref 8–23)
CO2: 25 mmol/L (ref 22–32)
Calcium: 9 mg/dL (ref 8.9–10.3)
Chloride: 105 mmol/L (ref 98–111)
Creatinine: 0.98 mg/dL (ref 0.44–1.00)
GFR, Estimated: 59 mL/min — ABNORMAL LOW (ref >60)
Glucose, Bld: 135 mg/dL — ABNORMAL HIGH (ref 70–99)
Potassium: 4.4 mmol/L (ref 3.5–5.1)
Sodium: 140 mmol/L (ref 135–145)
Total Bilirubin: 0.5 mg/dL (ref 0.3–1.2)
Total Protein: 5.6 g/dL — ABNORMAL LOW (ref 6.5–8.1)

## 2021-11-29 LAB — CBC WITH DIFFERENTIAL (CANCER CENTER ONLY)
Abs Immature Granulocytes: 0.07 10*3/uL (ref 0.00–0.07)
Basophils Absolute: 0 10*3/uL (ref 0.0–0.1)
Basophils Relative: 0 %
Eosinophils Absolute: 0.1 10*3/uL (ref 0.0–0.5)
Eosinophils Relative: 0 %
HCT: 32.2 % — ABNORMAL LOW (ref 36.0–46.0)
Hemoglobin: 10.5 g/dL — ABNORMAL LOW (ref 12.0–15.0)
Immature Granulocytes: 0 %
Lymphocytes Relative: 8 %
Lymphs Abs: 1.3 10*3/uL (ref 0.7–4.0)
MCH: 33.4 pg (ref 26.0–34.0)
MCHC: 32.6 g/dL (ref 30.0–36.0)
MCV: 102.5 fL — ABNORMAL HIGH (ref 80.0–100.0)
Monocytes Absolute: 0.7 10*3/uL (ref 0.1–1.0)
Monocytes Relative: 5 %
Neutro Abs: 13.7 10*3/uL — ABNORMAL HIGH (ref 1.7–7.7)
Neutrophils Relative %: 87 %
Platelet Count: 308 10*3/uL (ref 150–400)
RBC: 3.14 MIL/uL — ABNORMAL LOW (ref 3.87–5.11)
RDW: 25.9 % — ABNORMAL HIGH (ref 11.5–15.5)
WBC Count: 15.8 10*3/uL — ABNORMAL HIGH (ref 4.0–10.5)
nRBC: 0 % (ref 0.0–0.2)

## 2021-11-29 LAB — CEA (ACCESS): CEA (CHCC): 15.68 ng/mL — ABNORMAL HIGH (ref 0.00–5.00)

## 2021-11-29 NOTE — Progress Notes (Signed)
[facility] OFFICE PROGRESS NOTE   Diagnosis: Colon cancer  INTERVAL HISTORY:   Kaitlyn Hoover returns for follow-up.  She began cycle 4 Xeloda 11/12/2021.  She reports the last dose of Xeloda was 11/16/2021.  She was hospitalized in [place] 11/18/2021 with vomiting and diarrhea.  She was diagnosed with a small bowel obstruction.  CT scan showed small bowel obstruction with transition to normal caliber bowel in the upper abdomen in the vicinity of postsurgical change related to prior partial colectomy.  There was a questionable enhancing annular mass associated with the proximal small bowel versus enhancement related to underdistention.  Nasogastric tube was placed.  Small bowel follow-through showed contrast only seen as far as mid small bowel level with significant distention, concerning for obstruction.  She was seen by GI, no colonoscopy recommended.  Nasogastric tube was clamped and diet advanced.  Follow-up abdomen x-ray 11/23/2021 showed moderate contrast passed to the distal colon and rectum, no high-grade or complete obstruction or extravasation seen, no pneumatosis or free air.  The nasogastric tube was removed.   She was discharged 11/25/2021.    She reports continuing a liquid diet per instructions when she left the hospital in [place].  She is no longer having nausea/vomiting or diarrhea.  No mouth sores.  No hand or foot pain or redness.  She is very weak.  Her son is currently staying with her.  Objective:  Vital signs in last 24 hours:  Blood pressure (!) 107/58, pulse 82, temperature 98.1 F (36.7 C), temperature source Oral, resp. rate 19, height 5' 2"  (1.575 m), weight 121 lb 1.6 oz (54.9 kg), SpO2 100 %.    HEENT: No thrush or ulcers. Resp: Lungs clear bilaterally. Cardio: Regular rate and rhythm. GI: Abdomen soft and nontender.  No hepatomegaly. Vascular: No leg edema. Skin: Skin turgor intact.   Lab Results:  Lab Results  Component Value Date   WBC 15.8  (H) 11/29/2021   HGB 10.5 (L) 11/29/2021   HCT 32.2 (L) 11/29/2021   MCV 102.5 (H) 11/29/2021   PLT 308 11/29/2021   NEUTROABS 13.7 (H) 11/29/2021    Imaging:  No results found.  Medications: I have reviewed the patient's current medications.  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 Tumor invades through the serosa, 3/25 lymph nodes, MSS, no loss of mismatch repair protein expression CTs 06/26/2021-eccentric wall thickening of the hepatic flexure, tiny pulmonary nodules measuring up to 2 mm-likely infectious or inflammatory Elevated preoperative CEA Cycle 1 Xeloda 09/10/2021 Cycle 2 Xeloda 10/01/2021 Cycle 3 Xeloda 10/22/2021 Cycle 4 Xeloda 11/12/2021-last dose 11/16/2021, hospitalized in [place] 11/18/2021 with small bowel obstruction History of colon polyps including on the right colectomy specimen 08/15/2021 Bipolar disease Atrial fibrillation maintained on anticoagulation Subcutaneous lesion right lower chest inferior to the breast-possibly a lipoma Admission to the hospital in [place] 11/18/2021 with nausea/vomiting/diarrhea.  Diagnosed with small bowel obstruction.  Discharged home 11/25/2021.  Disposition: Kaitlyn Hoover appears stable.  She began cycle 4 Xeloda on 11/12/2021.  Several days later she developed significant nausea/vomiting/diarrhea.  She was in [place] when this occurred and was admitted to a hospital there.  Last dose of Xeloda 11/16/2021.  She was diagnosed with a small bowel obstruction.  Nasogastric tube was placed.  Symptoms improved.  She is tolerating a liquid diet at present.  We recommended she progress her diet to low residual.  She will continue to hold Xeloda.  She will return for lab and  follow-up in approximately 1 week.  She understands to contact the office with recurrent nausea/vomiting/diarrhea.  Patient seen with Dr. Benay Hoover.   Kaitlyn Hoover ANP/GNP-BC   11/29/2021  1:41 PM This was  a shared visit with Kaitlyn Hoover.  Kaitlyn Hoover was interviewed and examined.  We reviewed records from the hospital admission in [place].  She was admitted on 12/07/2018 while in [place] with a small bowel obstruction.  She was treated with NG tube decompression and bowel rest.  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  She was discharged to home 11/25/2021.  She continues a liquid diet.  No further nausea or vomiting.  She is having bowel movements.  We feel it is unlikely the hospital admission was related to Xeloda chemotherapy.  The bowel obstruction was most likely secondary to adhesions.  She will continue a full liquid diet for now.  I will contact Dr. Marcello Hoover to discuss the indication for further imaging, a colonoscopy, and diet recommendations.  I was present for greater than 50% of today's visit.  I performed medical decision making.  Kaitlyn Manson, MD

## 2021-11-30 ENCOUNTER — Telehealth: Payer: Self-pay | Admitting: Cardiology

## 2021-11-30 NOTE — Telephone Encounter (Signed)
°  bp is low, very weak, dr Lindell Noe decreased diltiazem and wanted to make Dr. Curt Bears aware

## 2021-11-30 NOTE — Telephone Encounter (Signed)
Spoke to Humphrey and got dosing information.  New Diltiazem dose is 180 mg QD changed on 11/28/21. Updated medication list.

## 2021-12-01 ENCOUNTER — Other Ambulatory Visit: Payer: Self-pay | Admitting: Cardiology

## 2021-12-02 ENCOUNTER — Other Ambulatory Visit: Payer: Self-pay | Admitting: Psychiatry

## 2021-12-02 DIAGNOSIS — F250 Schizoaffective disorder, bipolar type: Secondary | ICD-10-CM

## 2021-12-03 ENCOUNTER — Telehealth: Payer: Self-pay

## 2021-12-03 NOTE — Telephone Encounter (Signed)
Called [facility] and spoke with Seth Bake to inform her the patient decided not to get the wheelchair.

## 2021-12-04 ENCOUNTER — Other Ambulatory Visit (HOSPITAL_COMMUNITY): Payer: Self-pay

## 2021-12-05 ENCOUNTER — Inpatient Hospital Stay: Payer: PPO

## 2021-12-05 ENCOUNTER — Other Ambulatory Visit: Payer: Self-pay

## 2021-12-05 ENCOUNTER — Encounter: Payer: Self-pay | Admitting: Nurse Practitioner

## 2021-12-05 ENCOUNTER — Inpatient Hospital Stay (HOSPITAL_BASED_OUTPATIENT_CLINIC_OR_DEPARTMENT_OTHER): Payer: PPO | Admitting: Nurse Practitioner

## 2021-12-05 VITALS — BP 122/58 | HR 94 | Temp 97.8°F | Resp 18 | Ht 62.0 in | Wt 120.0 lb

## 2021-12-05 DIAGNOSIS — C184 Malignant neoplasm of transverse colon: Secondary | ICD-10-CM | POA: Diagnosis not present

## 2021-12-05 DIAGNOSIS — C183 Malignant neoplasm of hepatic flexure: Secondary | ICD-10-CM | POA: Diagnosis not present

## 2021-12-05 LAB — CBC WITH DIFFERENTIAL (CANCER CENTER ONLY)
Abs Immature Granulocytes: 0.03 10*3/uL (ref 0.00–0.07)
Basophils Absolute: 0.1 10*3/uL (ref 0.0–0.1)
Basophils Relative: 1 %
Eosinophils Absolute: 0.1 10*3/uL (ref 0.0–0.5)
Eosinophils Relative: 1 %
HCT: 33.5 % — ABNORMAL LOW (ref 36.0–46.0)
Hemoglobin: 10.5 g/dL — ABNORMAL LOW (ref 12.0–15.0)
Immature Granulocytes: 1 %
Lymphocytes Relative: 19 %
Lymphs Abs: 1.3 10*3/uL (ref 0.7–4.0)
MCH: 33 pg (ref 26.0–34.0)
MCHC: 31.3 g/dL (ref 30.0–36.0)
MCV: 105.3 fL — ABNORMAL HIGH (ref 80.0–100.0)
Monocytes Absolute: 0.4 10*3/uL (ref 0.1–1.0)
Monocytes Relative: 6 %
Neutro Abs: 4.7 10*3/uL (ref 1.7–7.7)
Neutrophils Relative %: 72 %
Platelet Count: 333 10*3/uL (ref 150–400)
RBC: 3.18 MIL/uL — ABNORMAL LOW (ref 3.87–5.11)
RDW: 24.7 % — ABNORMAL HIGH (ref 11.5–15.5)
WBC Count: 6.5 10*3/uL (ref 4.0–10.5)
nRBC: 0 % (ref 0.0–0.2)

## 2021-12-05 LAB — CMP (CANCER CENTER ONLY)
ALT: 18 U/L (ref 0–44)
AST: 26 U/L (ref 15–41)
Albumin: 3.3 g/dL — ABNORMAL LOW (ref 3.5–5.0)
Alkaline Phosphatase: 73 U/L (ref 38–126)
Anion gap: 8 (ref 5–15)
BUN: 12 mg/dL (ref 8–23)
CO2: 27 mmol/L (ref 22–32)
Calcium: 9.3 mg/dL (ref 8.9–10.3)
Chloride: 108 mmol/L (ref 98–111)
Creatinine: 0.98 mg/dL (ref 0.44–1.00)
GFR, Estimated: 59 mL/min — ABNORMAL LOW (ref >60)
Glucose, Bld: 109 mg/dL — ABNORMAL HIGH (ref 70–99)
Potassium: 4.1 mmol/L (ref 3.5–5.1)
Sodium: 143 mmol/L (ref 135–145)
Total Bilirubin: 0.5 mg/dL (ref 0.3–1.2)
Total Protein: 6.1 g/dL — ABNORMAL LOW (ref 6.5–8.1)

## 2021-12-05 NOTE — Progress Notes (Signed)
[facility] OFFICE PROGRESS NOTE   Diagnosis: Colon cancer  INTERVAL HISTORY:   Kaitlyn Hoover returns as scheduled.  She is tolerating mechanical soft diet.  No nausea or vomiting.  Bowels moving daily.  No diarrhea.  Her brother has been helping her with meals.  She continues to feel very weak, unable to complete ADLs independently.  Objective:  Vital signs in last 24 hours:  Blood pressure (!) 122/58, pulse 94, temperature 97.8 F (36.6 C), temperature source Oral, resp. rate 18, height $RemoveBe'5\' 2"'[id number]$  (1.575 m), weight 120 lb (54.4 kg), SpO2 100 %.    HEENT: No thrush or ulcers. Resp: Lungs clear bilaterally. Cardio: Regular rate and rhythm. GI: Abdomen soft and nontender.  No hepatosplenomegaly. Vascular: No leg edema. Neuro: Alert and oriented.  Follows commands. Skin: Palms without erythema.   Lab Results:  Lab Results  Component Value Date   WBC 6.5 12/05/2021   HGB 10.5 (L) 12/05/2021   HCT 33.5 (L) 12/05/2021   MCV 105.3 (H) 12/05/2021   PLT 333 12/05/2021   NEUTROABS 4.7 12/05/2021    Imaging:  No results found.  Medications: I have reviewed the patient's current medications.  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 Tumor invades through the serosa, 3/25 lymph nodes, MSS, no loss of mismatch repair protein expression CTs 06/26/2021-eccentric wall thickening of the hepatic flexure, tiny pulmonary nodules measuring up to 2 mm-likely infectious or inflammatory Elevated preoperative CEA Cycle 1 Xeloda 09/10/2021 Cycle 2 Xeloda 10/01/2021 Cycle 3 Xeloda 10/22/2021 Cycle 4 Xeloda 11/12/2021-last dose 11/16/2021, hospitalized in [place] 11/18/2021 with small bowel obstruction History of colon polyps including on the right colectomy specimen 08/15/2021 Bipolar disease Atrial fibrillation maintained on anticoagulation Subcutaneous lesion right lower chest inferior to the breast-possibly a  lipoma Admission to the hospital in [place] 11/18/2021 with nausea/vomiting/diarrhea.  Diagnosed with small bowel obstruction.  Discharged home 11/25/2021.  Disposition: [name] appears stable.  She has completed 3 full cycles of Xeloda and a portion of cycle 4.  Cycle 4 was discontinued prematurely due to hospital admission with a small bowel obstruction likely due to adhesions.  She continues to recover from the recent hospitalization, remains weak.  We will continue to hold Xeloda for now.  She will slowly advance her diet, avoiding things like chunks of steak and raw vegetables.  She will return for follow-up in 1 week.    Ned Card ANP/GNP-BC   12/05/2021  1:39 PM

## 2021-12-06 ENCOUNTER — Other Ambulatory Visit (HOSPITAL_COMMUNITY): Payer: Self-pay

## 2021-12-08 DIAGNOSIS — E611 Iron deficiency: Secondary | ICD-10-CM | POA: Diagnosis not present

## 2021-12-08 DIAGNOSIS — K5900 Constipation, unspecified: Secondary | ICD-10-CM | POA: Diagnosis not present

## 2021-12-08 DIAGNOSIS — C189 Malignant neoplasm of colon, unspecified: Secondary | ICD-10-CM | POA: Diagnosis not present

## 2021-12-08 DIAGNOSIS — R5382 Chronic fatigue, unspecified: Secondary | ICD-10-CM | POA: Diagnosis not present

## 2021-12-08 DIAGNOSIS — Z86010 Personal history of colonic polyps: Secondary | ICD-10-CM | POA: Diagnosis not present

## 2021-12-08 DIAGNOSIS — K2930 Chronic superficial gastritis without bleeding: Secondary | ICD-10-CM | POA: Diagnosis not present

## 2021-12-08 DIAGNOSIS — N183 Chronic kidney disease, stage 3 unspecified: Secondary | ICD-10-CM | POA: Diagnosis not present

## 2021-12-08 DIAGNOSIS — I4891 Unspecified atrial fibrillation: Secondary | ICD-10-CM | POA: Diagnosis not present

## 2021-12-08 DIAGNOSIS — C154 Malignant neoplasm of middle third of esophagus: Secondary | ICD-10-CM | POA: Diagnosis not present

## 2021-12-08 DIAGNOSIS — K56609 Unspecified intestinal obstruction, unspecified as to partial versus complete obstruction: Secondary | ICD-10-CM | POA: Diagnosis not present

## 2021-12-08 DIAGNOSIS — K219 Gastro-esophageal reflux disease without esophagitis: Secondary | ICD-10-CM | POA: Diagnosis not present

## 2021-12-08 DIAGNOSIS — Z87891 Personal history of nicotine dependence: Secondary | ICD-10-CM | POA: Diagnosis not present

## 2021-12-08 DIAGNOSIS — M8580 Other specified disorders of bone density and structure, unspecified site: Secondary | ICD-10-CM | POA: Diagnosis not present

## 2021-12-08 DIAGNOSIS — Z8719 Personal history of other diseases of the digestive system: Secondary | ICD-10-CM | POA: Diagnosis not present

## 2021-12-08 DIAGNOSIS — R531 Weakness: Secondary | ICD-10-CM | POA: Diagnosis not present

## 2021-12-08 DIAGNOSIS — F319 Bipolar disorder, unspecified: Secondary | ICD-10-CM | POA: Diagnosis not present

## 2021-12-08 DIAGNOSIS — I739 Peripheral vascular disease, unspecified: Secondary | ICD-10-CM | POA: Diagnosis not present

## 2021-12-08 DIAGNOSIS — K222 Esophageal obstruction: Secondary | ICD-10-CM | POA: Diagnosis not present

## 2021-12-08 DIAGNOSIS — E785 Hyperlipidemia, unspecified: Secondary | ICD-10-CM | POA: Diagnosis not present

## 2021-12-08 DIAGNOSIS — E039 Hypothyroidism, unspecified: Secondary | ICD-10-CM | POA: Diagnosis not present

## 2021-12-08 DIAGNOSIS — E860 Dehydration: Secondary | ICD-10-CM | POA: Diagnosis not present

## 2021-12-08 DIAGNOSIS — N3281 Overactive bladder: Secondary | ICD-10-CM | POA: Diagnosis not present

## 2021-12-08 DIAGNOSIS — A809 Acute poliomyelitis, unspecified: Secondary | ICD-10-CM | POA: Diagnosis not present

## 2021-12-12 ENCOUNTER — Inpatient Hospital Stay (HOSPITAL_BASED_OUTPATIENT_CLINIC_OR_DEPARTMENT_OTHER): Payer: PPO | Admitting: Nurse Practitioner

## 2021-12-12 ENCOUNTER — Encounter: Payer: Self-pay | Admitting: Nurse Practitioner

## 2021-12-12 ENCOUNTER — Inpatient Hospital Stay: Payer: PPO | Admitting: Nurse Practitioner

## 2021-12-12 ENCOUNTER — Other Ambulatory Visit (HOSPITAL_COMMUNITY): Payer: Self-pay

## 2021-12-12 ENCOUNTER — Other Ambulatory Visit: Payer: Self-pay

## 2021-12-12 VITALS — BP 141/58 | HR 92 | Temp 97.8°F | Resp 18 | Ht 62.0 in | Wt 119.2 lb

## 2021-12-12 DIAGNOSIS — C183 Malignant neoplasm of hepatic flexure: Secondary | ICD-10-CM | POA: Diagnosis not present

## 2021-12-12 DIAGNOSIS — C184 Malignant neoplasm of transverse colon: Secondary | ICD-10-CM

## 2021-12-12 NOTE — Progress Notes (Signed)
[facility] OFFICE PROGRESS NOTE   Diagnosis: Colon cancer  INTERVAL HISTORY:   [name] returns as scheduled.  She is feeling much better.  She notes an increase in strength/energy.  Oral intake has improved as well.  She was able to walk into the office today and did not use a wheelchair.  She denies nausea/vomiting.  No mouth sores.  No diarrhea.  Objective:  Vital signs in last 24 hours:  Blood pressure (!) 141/58, pulse 92, temperature 97.8 F (36.6 C), temperature source Oral, resp. rate 18, height $RemoveBe'5\' 2"'BqOCEQMsl$  (1.575 m), weight 119 lb 3.2 oz (54.1 kg), SpO2 98 %.    HEENT: No thrush or ulcers. Resp: Lungs clear bilaterally. Cardio: Regular rate and rhythm. GI: Abdomen soft and nontender.  No hepatomegaly. Vascular: No leg edema.  Lab Results:  Lab Results  Component Value Date   WBC 6.5 12/05/2021   HGB 10.5 (L) 12/05/2021   HCT 33.5 (L) 12/05/2021   MCV 105.3 (H) 12/05/2021   PLT 333 12/05/2021   NEUTROABS 4.7 12/05/2021    Imaging:  No results found.  Medications: I have reviewed the patient's current medications.  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 Tumor invades through the serosa, 3/25 lymph nodes, MSS, no loss of mismatch repair protein expression CTs 06/26/2021-eccentric wall thickening of the hepatic flexure, tiny pulmonary nodules measuring up to 2 mm-likely infectious or inflammatory Elevated preoperative CEA Cycle 1 Xeloda 09/10/2021 Cycle 2 Xeloda 10/01/2021 Cycle 3 Xeloda 10/22/2021 Cycle 4 Xeloda 11/12/2021-last dose 11/16/2021, hospitalized in [place] 11/18/2021 with small bowel obstruction Cycle 5 Xeloda 12/17/2021 History of colon polyps including on the right colectomy specimen 08/15/2021 Bipolar disease Atrial fibrillation maintained on anticoagulation Subcutaneous lesion right lower chest inferior to the breast-possibly a lipoma Admission to the hospital  in [place] 11/18/2021 with nausea/vomiting/diarrhea.  Diagnosed with small bowel obstruction.  Discharged home 11/25/2021.  Disposition: Ms. Gharibian has completed 3 cycles of Xeloda and a few days of cycle 4.  Cycle 4 was discontinued prematurely due to hospitalization with a small bowel obstruction likely due to adhesions.  Xeloda has been on hold due to a poor performance status.  She appears much improved.  We discussed resuming Xeloda.  She feels she is ready.  She will begin a cycle of Xeloda on 12/17/2021.  She understands to stop Xeloda and contact the office with any worrisome symptoms.  We specifically discussed nausea/vomiting, mouth sores, diarrhea, hand/foot pain or redness.  She will return for lab and follow-up on 12/31/2021.  We are available to see her sooner if needed.   Ned Card ANP/GNP-BC   12/12/2021  3:34 PM

## 2021-12-13 ENCOUNTER — Telehealth: Payer: Self-pay | Admitting: *Deleted

## 2021-12-13 ENCOUNTER — Other Ambulatory Visit (HOSPITAL_COMMUNITY): Payer: Self-pay

## 2021-12-13 ENCOUNTER — Other Ambulatory Visit: Payer: Self-pay | Admitting: Oncology

## 2021-12-13 DIAGNOSIS — C184 Malignant neoplasm of transverse colon: Secondary | ICD-10-CM

## 2021-12-13 MED ORDER — CAPECITABINE 500 MG PO TABS
ORAL_TABLET | ORAL | 0 refills | Status: DC
  Filled 2021-12-13: qty 20, 4d supply, fill #0

## 2021-12-13 NOTE — Telephone Encounter (Signed)
Patient reports she has #50 capecitabine pills on had for cycle to begin on 12/17/21, thus she will need #20 more for this cycle. Refilled medication for quantity of #20.

## 2021-12-14 ENCOUNTER — Telehealth: Payer: Self-pay | Admitting: Pharmacist

## 2021-12-14 ENCOUNTER — Other Ambulatory Visit (HOSPITAL_COMMUNITY): Payer: Self-pay

## 2021-12-14 DIAGNOSIS — Z86010 Personal history of colonic polyps: Secondary | ICD-10-CM | POA: Diagnosis not present

## 2021-12-14 DIAGNOSIS — Z8719 Personal history of other diseases of the digestive system: Secondary | ICD-10-CM | POA: Diagnosis not present

## 2021-12-14 DIAGNOSIS — Z87891 Personal history of nicotine dependence: Secondary | ICD-10-CM | POA: Diagnosis not present

## 2021-12-14 DIAGNOSIS — K219 Gastro-esophageal reflux disease without esophagitis: Secondary | ICD-10-CM | POA: Diagnosis not present

## 2021-12-14 DIAGNOSIS — E039 Hypothyroidism, unspecified: Secondary | ICD-10-CM | POA: Diagnosis not present

## 2021-12-14 DIAGNOSIS — C189 Malignant neoplasm of colon, unspecified: Secondary | ICD-10-CM | POA: Diagnosis not present

## 2021-12-14 DIAGNOSIS — I4891 Unspecified atrial fibrillation: Secondary | ICD-10-CM | POA: Diagnosis not present

## 2021-12-14 DIAGNOSIS — N3281 Overactive bladder: Secondary | ICD-10-CM | POA: Diagnosis not present

## 2021-12-14 DIAGNOSIS — E860 Dehydration: Secondary | ICD-10-CM | POA: Diagnosis not present

## 2021-12-14 DIAGNOSIS — M8580 Other specified disorders of bone density and structure, unspecified site: Secondary | ICD-10-CM | POA: Diagnosis not present

## 2021-12-14 DIAGNOSIS — R5382 Chronic fatigue, unspecified: Secondary | ICD-10-CM | POA: Diagnosis not present

## 2021-12-14 DIAGNOSIS — F319 Bipolar disorder, unspecified: Secondary | ICD-10-CM | POA: Diagnosis not present

## 2021-12-14 DIAGNOSIS — E611 Iron deficiency: Secondary | ICD-10-CM | POA: Diagnosis not present

## 2021-12-14 DIAGNOSIS — K56609 Unspecified intestinal obstruction, unspecified as to partial versus complete obstruction: Secondary | ICD-10-CM | POA: Diagnosis not present

## 2021-12-14 DIAGNOSIS — K2930 Chronic superficial gastritis without bleeding: Secondary | ICD-10-CM | POA: Diagnosis not present

## 2021-12-14 DIAGNOSIS — K222 Esophageal obstruction: Secondary | ICD-10-CM | POA: Diagnosis not present

## 2021-12-14 DIAGNOSIS — E785 Hyperlipidemia, unspecified: Secondary | ICD-10-CM | POA: Diagnosis not present

## 2021-12-14 DIAGNOSIS — A809 Acute poliomyelitis, unspecified: Secondary | ICD-10-CM | POA: Diagnosis not present

## 2021-12-14 DIAGNOSIS — N183 Chronic kidney disease, stage 3 unspecified: Secondary | ICD-10-CM | POA: Diagnosis not present

## 2021-12-14 DIAGNOSIS — K5900 Constipation, unspecified: Secondary | ICD-10-CM | POA: Diagnosis not present

## 2021-12-14 DIAGNOSIS — R531 Weakness: Secondary | ICD-10-CM | POA: Diagnosis not present

## 2021-12-14 DIAGNOSIS — C154 Malignant neoplasm of middle third of esophagus: Secondary | ICD-10-CM | POA: Diagnosis not present

## 2021-12-14 DIAGNOSIS — I739 Peripheral vascular disease, unspecified: Secondary | ICD-10-CM | POA: Diagnosis not present

## 2021-12-14 NOTE — Telephone Encounter (Signed)
Oral Oncology Pharmacist Encounter   Received notification from [employer] that prior authorization for capecitabine is required.   PA submitted on CMM Key [id number]  Status is pending   Oral Oncology Clinic will continue to follow.   Darl Pikes, PharmD, BCPS, [facility] Hematology/Oncology Clinical Pharmacist [facility] [phone]  12/14/2021 2:08 PM

## 2021-12-17 ENCOUNTER — Other Ambulatory Visit (HOSPITAL_COMMUNITY): Payer: Self-pay

## 2021-12-17 NOTE — Telephone Encounter (Signed)
Oral Oncology Patient Advocate Encounter  Prior Authorization for Capecitabine has been approved.    PA# [id number] Effective dates: 12/14/21 through 11/17/22.  Patients co-pay is $4.09 for remainder of fill to complete with radiation therapy.  Oral Oncology Clinic will continue to follow.   [facility] [place] Phone [phone] Fax [phone] 12/17/2021 9:43 AM

## 2021-12-18 ENCOUNTER — Encounter: Payer: Self-pay | Admitting: Psychiatry

## 2021-12-18 ENCOUNTER — Other Ambulatory Visit: Payer: Self-pay

## 2021-12-18 ENCOUNTER — Ambulatory Visit: Payer: PPO | Admitting: Psychiatry

## 2021-12-18 DIAGNOSIS — F250 Schizoaffective disorder, bipolar type: Secondary | ICD-10-CM | POA: Diagnosis not present

## 2021-12-18 DIAGNOSIS — G3184 Mild cognitive impairment, so stated: Secondary | ICD-10-CM | POA: Diagnosis not present

## 2021-12-18 DIAGNOSIS — F99 Mental disorder, not otherwise specified: Secondary | ICD-10-CM

## 2021-12-18 DIAGNOSIS — F5105 Insomnia due to other mental disorder: Secondary | ICD-10-CM

## 2021-12-18 MED ORDER — DONEPEZIL HCL 10 MG PO TABS: 10.0000 mg | ORAL_TABLET | Freq: Every day | ORAL | 3 refills | Status: DC

## 2021-12-18 MED ORDER — FANAPT 4 MG PO TABS: 4.0000 mg | ORAL_TABLET | Freq: Every evening | ORAL | 3 refills | Status: DC

## 2021-12-18 MED ORDER — TEMAZEPAM 7.5 MG PO CAPS: 7.5000 mg | ORAL_CAPSULE | Freq: Every day | ORAL | 5 refills | Status: DC

## 2021-12-18 MED ORDER — ZIPRASIDONE HCL 40 MG PO CAPS: 40.0000 mg | ORAL_CAPSULE | Freq: Every day | ORAL | 1 refills | Status: DC

## 2021-12-18 MED ORDER — LAMOTRIGINE 100 MG PO TABS: 100.0000 mg | ORAL_TABLET | Freq: Every day | ORAL | 1 refills | Status: DC

## 2021-12-18 NOTE — Progress Notes (Signed)
Kaitlyn Hoover [id number] Aug 24, 1941 81 y.o.     Subjective:   Patient ID:  Kaitlyn Hoover is a 81 y.o. (DOB Jun 30, 1941) female.  Chief Complaint:  Chief Complaint  Patient presents with   Follow-up    Schizoaffective disorder, bipolar type ([place])   Sleeping Problem    Anxiety Patient reports no confusion, decreased concentration, dizziness, nervous/anxious behavior, palpitations or suicidal ideas.    Depression        Associated symptoms include fatigue.  Associated symptoms include no decreased concentration and no suicidal ideas.  Past medical history includes anxiety.   Kaitlyn Hoover presents for follow-up of psych issures  seen November 2020  and no meds were changed.  She called in August 2020  wanting to increase donepezil from 5 to 10 mg daily.  That was done.  03/28/20 appt with the following noted: Deep depression March.  No pcpt other than being confined.  No seasonal changes.  No real problems with depression in years.  Did worry about it.  Even had SI without plan.  Then got better.  Totally better now spontaneously.  She wonders about reducing ziprasidone. Doing OK overall.  Bored.  Not depressed.  Sleeping well with temazepam 30 mg with 8-9 hours.  Doing Zoom with church and looks forward to it.  Otherwise watching TV. Tolerated Aricept and has seen benefit for recall.  No paranoia or fear.   No med changes  06/26/20 TC stating: She's referring to the Temazepam 30 mg, she stopped on Thursday and hasn't slept in  4 days. She asked if eventually she would start sleeping without? I discussed that you would might recommend she go down to 15 mg, she really doesn't want to take it at all but will do as instructed. She does really she has to get some sleep. She's tried Benadryl but not helped either.  Following info was given to her: She has tapered off temazepam before but had to restart it.  She is tried alternatives clonazepam, Tranxene, trazodone, hydroxyzine.   She prefers to avoid meds that could have weight gain.  She could probably stop temazepam more quickly and easily and still sleep if she took half of the 25 mg quetiapine but it may still give her a hangover. The way we tapered her off temazepam the last time was in the traditional manner of reducing the dose.  She still will have some insomnia but it likely is to be more bearable.  I would suggest we send in a prescription for 15 mg capsules and she take that for 2 to 4 weeks and she can try stopping again if she wants to at that point. If she stops temazepam cold [place] as she is doing currently she is likely to have pretty serious insomnia and very fragmented sleep that will not respond to Benadryl for a period of 2 to 4 weeks.  She has chronic insomnia which is the reason she is taken the temazepam and I am not sure what her sleep will be like once she gets through the withdrawal phase of stopping the temazepam. Pt via TC stated: Rtc to [facility] and she would prefer the 15 mg capsule of Temazepam, take 1 at hs. Rx called into her [facility] [address] Discussed her chronic insomnia and she verbalized understanding. Advised to call back with worsening condition or not effective for sleep.   07/05/20 appt with the following noted: Hell trying to stop temazepam 30 mg cold [place].  Couldn't sleep for days. Sleep is fine on 15 mg HS however. No interest, not going to church since Pandemic.  Not worrying much about Covid.  Got vaccine.  Can't motivate herself to go get clothes or yard work.  Just sits and watches TV or reads.  Only social with friends once monthly.  Don't understand while not going to church.  Unmotivated and some sadness. No sig worry or fear other than about her mental health and no paranoia. Doctor said iron was low.  Recent UTI and ABx helped energy.  Donepezil has really helped with her memory she is well pleased.  Patient denies any suicidal ideation. Plan: Added lamotrigine for  depression  09/07/20 appt with following noted: More motivated and active and productive and less napping.  Big difference with addtion of lamotrigine.  Got benefit by 50 mg daily. NO SE. Depression resolved "definitely" No paranoia.  Nor fearfulness nor mood swings. Geodon and Fanapt doses are perfect.  Mild mouth movements but not severe. Sleep well with temazepam. Plan: no med changes  03/08/21 appt noted: Just not having any trouble.  Got me on the right medication.  Occ a little down but it's ok. No SE with lamotrigine. Had a fall and hurt back.  Not much better and had her down some.  Don't get out much. 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  Not going to church bc it's hard. Sleep OK No paranoia.  09/06/21 appt noted: Colon CA dx and surg 3 weeks ago. 3 positive nodes and started chemo soon. Been awful.  Depressed  since surgery.  Not really bad.  Feels drugged in AM until about 1 PM No change in meds.  Sleep 10 -8.    No insomnia.   Worried over chemo and how it will affect her.  Not worried over cancer. No paranoia since here. Plan: Yes reduce temazepam to 7.5 mg HS Continue ziprasidone 40, Fanapt 4 mg 1/4 tablet daily, lamotrigine 100  12/18/21 appt noted: Doing OK. Sleeping fine with temazepam 7.5 mg HS No mood problems or swings.  No fear or anxiety.  No paranoia. Satisfied with meds.  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 Otherwise satisfied with activity.  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  Past Psychiatric Medication Trials: Olanzapine haloperidol, Abilify, Latuda, Saphris, Geodon 60 , Fanapt 2 mg,  Depakote 250 mg 3 times d aily, benztropine, Artane,  clonazepam side effects, Tranxene increased appetite, temazepam,  trazodone, hydroxyzine She has been under the care of this office since 2003.  Review of Systems:  Review of Systems  Constitutional:  Positive for fatigue.  HENT:         Jaw clenching stable.  Cardiovascular:   Negative for palpitations.  Gastrointestinal:  Negative for abdominal pain.  Musculoskeletal:  Positive for back pain and gait problem.  Neurological:  Positive for facial asymmetry and weakness. Negative for dizziness and tremors.  Psychiatric/Behavioral:  Negative for agitation, behavioral problems, confusion, decreased concentration, dysphoric mood, hallucinations, self-injury, sleep disturbance and suicidal ideas. The patient is not nervous/anxious and is not hyperactive.    Medications: I have reviewed the patient's current medications.  Current Outpatient Medications  Medication Sig Dispense Refill   acyclovir (ZOVIRAX) 400 MG tablet Take 400 mg at bedtime by mouth.      alendronate (FOSAMAX) 70 MG tablet Take 70 mg by mouth every Sunday. Take with a full glass of water on an empty stomach.  Ascorbic Acid (VITAMIN C) 1000 MG tablet Take 1,000 mg by mouth daily.     azelastine (OPTIVAR) 0.05 % ophthalmic solution Place 2 drops into both eyes daily as needed. Inching eyes     b complex vitamins capsule Take 1 capsule by mouth daily.     Calcium Citrate-Vitamin D (CALCIUM CITRATE + D PO) Take 1,200 mg by mouth at bedtime.     capecitabine (XELODA) 500 MG tablet Take #3 tablets (1500 mg) every am and #2 tablets (1000 mg) every evening x 14 days on, then 7 day rest. Repeat every 21 days. Start this cycle on 12/17/21 20 tablet 0   cholecalciferol (VITAMIN D) 25 MCG (1000 UNIT) tablet Take 1,000 Units by mouth daily.     Coenzyme Q10 (CO Q-10) 100 MG CAPS Take 100 mg by mouth daily.      D-Ribose (RIBOSE, D,) POWD Take 2 g by mouth daily.      diltiazem (CARDIZEM CD) 180 MG 24 hr capsule Take 180 mg by mouth daily.     ELIQUIS 5 MG TABS tablet TAKE 1 TABLET BY MOUTH TWICE A DAY 180 tablet 1   ferrous sulfate 324 MG TBEC Take 324 mg by mouth daily with breakfast.     fluticasone (FLONASE) 50 MCG/ACT nasal spray Place 2 sprays into both nostrils daily.     GAVILAX 17 GM/SCOOP powder Take 17 g  by mouth daily. Miralax     Ginkgo Biloba 120 MG TABS Take 240 mg by mouth daily.     levothyroxine (SYNTHROID, LEVOTHROID) 100 MCG tablet Take 100 mcg by mouth daily before breakfast.      MAGNESIUM PO Take 250 mg by mouth every evening.     Melatonin 3 MG TABS Take 3 mg by mouth at bedtime.     Omega-3 Fatty Acids (FISH OIL BURP-LESS) 1200 MG CAPS Take 1,200 mg by mouth daily.     ondansetron (ZOFRAN) 8 MG tablet Take 1 tablet (8 mg total) by mouth every 8 (eight) hours as needed for nausea or vomiting. 30 tablet 0   oxybutynin (DITROPAN XL) 15 MG 24 hr tablet Take 15 mg by mouth daily.      Potassium Chloride ER 20 MEQ TBCR Take 1 tablet by mouth daily.     rosuvastatin (CRESTOR) 10 MG tablet TAKE 1 TABLET BY MOUTH UP TO 4 TIMES A WEEK ON SUNDAY, MONDAY,WEDNESDAY AND FRIDAY 45 tablet 3   sodium chloride (OCEAN) 0.65 % SOLN nasal spray Place 2 sprays into both nostrils every evening.     traMADol (ULTRAM) 50 MG tablet Take 1 tablet (50 mg total) by mouth 3 (three) times daily as needed. (Patient taking differently: Take 50 mg by mouth 3 (three) times daily as needed for severe pain or moderate pain.) 90 tablet 3   Turmeric 500 MG CAPS Take 1,000 mg by mouth daily.     vitamin B-12 (CYANOCOBALAMIN) 1000 MCG tablet Take 1,000 mcg by mouth daily.     donepezil (ARICEPT) 10 MG tablet Take 1 tablet (10 mg total) by mouth at bedtime. 90 tablet 3   iloperidone (FANAPT) 4 MG TABS tablet Take 1 tablet (4 mg total) by mouth at bedtime. 30 tablet 3   lamoTRIgine (LAMICTAL) 100 MG tablet Take 1 tablet (100 mg total) by mouth daily. 90 tablet 1   promethazine (PHENERGAN) 25 MG tablet Take 0.5-1 tablets (12.5-25 mg total) by mouth every 6 (six) hours as needed for nausea or vomiting. (Patient not taking: Reported  on 12/12/2021) 30 tablet 1   temazepam (RESTORIL) 7.5 MG capsule Take 1 capsule (7.5 mg total) by mouth at bedtime. 30 capsule 5   ziprasidone (GEODON) 40 MG capsule Take 1 capsule (40 mg total) by  mouth at bedtime. 90 capsule 1   No current facility-administered medications for this visit.    Medication Side Effects: Other: EPS prone.  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    Family History  Problem Relation Age of Onset   Hypertension Mother    Cancer Father        JAW BONE   COPD Brother    Atrial fibrillation Brother     Social History   Socioeconomic History   Marital status: Divorced    Spouse name: Not on file   Number of children: Not on file   Years of education: Not on file   Highest education level: Not on file  Occupational History   Not on file  Tobacco Use   Smoking status: Former    Packs/day: 1.00    Years: 25.00    Pack years: 25.00    Types: Cigarettes    Start date: 64    Quit date: 1998    Years since quitting: 25.0   Smokeless tobacco: Never   Tobacco comments:    QUIT SMOKING IN 1998  Vaping Use   Vaping Use: Never used  Substance and Sexual Activity   Alcohol use: Not Currently   Drug use: No   Sexual activity: Not on file  Other Topics Concern   Not on file  Social History Narrative   Not on file   Social Determinants of Health   Financial Resource Strain: Not on file  Food Insecurity: Not on file  Transportation Needs: Not on file  Physical Activity: Not on file  Stress: Not on file  Social Connections: Not on file  Intimate Partner Violence: Not on file    Past Medical History, Surgical history,  Social history, and Family history were reviewed and updated as appropriate.   Please see review of systems for further details on the patient's review from today.   Objective:   Physical Exam:  LMP  (LMP Unknown)   Physical Exam Constitutional:      General: She is not in acute distress. Musculoskeletal:        General: No deformity.  Neurological:     Mental Status: She is alert and oriented to person, place, and time.     Cranial Nerves: Dysarthria present.     Coordination: Coordination normal.     Comments: Mild jaw clenching  Psychiatric:        Attention and Perception: Attention and perception normal. She does not perceive auditory or visual hallucinations.        Mood and Affect: Mood is not anxious or depressed. Affect is not labile,  blunt, angry or inappropriate.        Speech: Speech normal. Speech is not slurred.        Behavior: Behavior normal. Behavior is not agitated or aggressive. Behavior is cooperative.        Thought Content: Thought content normal. Thought content is not paranoid or delusional. Thought content does not include homicidal or suicidal ideation. Thought content does not include suicidal plan.        Cognition and Memory: Cognition and memory normal.        Judgment: Judgment normal.     Comments: History of paranoia in remission   history of paranoia controlled.   Lab Review:     Component Value Date/Time   NA 143 12/05/2021 1237   NA 142 11/02/2020 1146   K 4.1 12/05/2021 1237   CL 108 12/05/2021 1237   CO2 27 12/05/2021 1237   GLUCOSE 109 (H) 12/05/2021 1237   BUN 12 12/05/2021 1237   BUN 15 11/02/2020 1146   CREATININE 0.98 12/05/2021 1237   CALCIUM 9.3 12/05/2021 1237   PROT 6.1 (L) 12/05/2021 1237   PROT 7.2 02/12/2017 1013   ALBUMIN 3.3 (L) 12/05/2021 1237   ALBUMIN 4.0 02/12/2017 1013   AST 26 12/05/2021 1237   ALT 18 12/05/2021 1237   ALKPHOS 73 12/05/2021 1237   BILITOT 0.5 12/05/2021 1237   GFRNONAA 59 (L) 12/05/2021  1237   GFRAA 53 (L) 11/02/2020 1146       Component Value Date/Time   WBC 6.5 12/05/2021 1237   WBC 10.8 (H) 08/17/2021 0428   RBC 3.18 (L) 12/05/2021 1237   HGB 10.5 (L) 12/05/2021 1237   HGB 11.8 11/02/2020 1146   HCT 33.5 (L) 12/05/2021 1237   HCT 34.4 11/02/2020 1146   PLT 333 12/05/2021 1237   PLT 252 11/02/2020 1146   MCV 105.3 (H) 12/05/2021 1237   MCV 92 11/02/2020 1146   MCH 33.0 12/05/2021 1237   MCHC 31.3 12/05/2021 1237   RDW 24.7 (H) 12/05/2021 1237   RDW 12.0 11/02/2020 1146   LYMPHSABS 1.3 12/05/2021 1237   LYMPHSABS 1.8 04/11/2017 0911   MONOABS 0.4 12/05/2021 1237   EOSABS 0.1 12/05/2021 1237   EOSABS 0.2 04/11/2017 0911   BASOSABS 0.1 12/05/2021 1237   BASOSABS 0.1 04/11/2017 0911    No results found for: POCLITH, LITHIUM   No results found for: PHENYTOIN, PHENOBARB, VALPROATE, CBMZ   .res Assessment: Plan:    Schizoaffective disorder, bipolar type ([place]) - Plan: ziprasidone (GEODON) 40 MG capsule, iloperidone (FANAPT) 4 MG TABS tablet, lamoTRIgine (LAMICTAL) 100 MG tablet  Insomnia due to other mental disorder - Plan: temazepam (RESTORIL) 7.5 MG capsule  Mild cognitive impairment - Plan: donepezil (ARICEPT) 10 MG tablet   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 It does not appear severe enough to warrant additional medication.  Greater than 50% of face to face time with patient was spent on counseling and coordination of care.  K has a long history of repeated episodes of paranoia associated with hypomania at times and at times independent of mood changes.  WE discussed her med-sensitivity.  As noted above she has tried multiple different antipsychotics and different antipsychotic combinations.  Discussed potential metabolic side effects associated with atypical antipsychotics, as well as potential risk for movement side effects. Advised pt to contact office if movement side effects occur.  She has mild dysarthria  which may be related but it is no worse  and it stable.  She is very specifically aware of the risk of tardive dyskinesia.   The unusual combination of antipsychotics is helpful in that the [place] actually offsets some of the dystonia that the Geodon tends to cause.  Lower dose of antipsychotics has led to worsening paranoia.  Her paranoia is well controlled at the current combination of medications.  Aricept 10 helped memory.  No SE. Still notices benefit.  Depression resolved with lamotrigine for the most part until surgery.  Defer increase. But will do so next time if still depressed.  Supportive therapy over dealing with cancer and mental health piece of it.  We discussed the short-term risks associated with benzodiazepines including sedation and increased fall risk among others.  Discussed long-term side effect risk including dependence, potential withdrawal symptoms, and the potential eventual dose-related risk of dementia.  But recent studies from 2020 dispute this association between benzodiazepines and dementia risk. Newer studies in 2020 do not support an association with dementia.  We have tried to taper her off benzodiazepines for sleep and she was able to be off for a short period of time but then had recurrence of insomnia.  She is aware of the risks but the benefit for sleep is noteworthy and she is failed multiple other sleep meds.   Discussed cost concerns with insurance currently not wanting to cover temazepam. Ok with reduced temazepam to 7.5 mg HS Continue ziprasidone 40, Fanapt 4 mg 1/4 tablet daily, lamotrigine 100  She has had worsening paranoia at lower doses of ziprasidone in the past.  No problems at  Follow-up 6 months  Lynder Parents MD, DFAPA   Please see After Visit Summary for patient specific instructions.  Future Appointments  Date Time Provider [place]  12/31/2021 11:45 AM [facility] PHLEBOTOMIST [facility] None  12/31/2021 12:00 PM Ladell Pier,  MD [facility] None  06/18/2022  1:30 PM Cottle, Billey Co., MD [facility] None    No orders of the defined types were placed in this encounter.      -------------------------------

## 2021-12-19 ENCOUNTER — Other Ambulatory Visit (HOSPITAL_COMMUNITY): Payer: Self-pay

## 2021-12-20 DIAGNOSIS — H35373 Puckering of macula, bilateral: Secondary | ICD-10-CM | POA: Diagnosis not present

## 2021-12-20 DIAGNOSIS — H26491 Other secondary cataract, right eye: Secondary | ICD-10-CM | POA: Diagnosis not present

## 2021-12-20 DIAGNOSIS — H5213 Myopia, bilateral: Secondary | ICD-10-CM | POA: Diagnosis not present

## 2021-12-21 DIAGNOSIS — K219 Gastro-esophageal reflux disease without esophagitis: Secondary | ICD-10-CM | POA: Diagnosis not present

## 2021-12-21 DIAGNOSIS — N183 Chronic kidney disease, stage 3 unspecified: Secondary | ICD-10-CM | POA: Diagnosis not present

## 2021-12-21 DIAGNOSIS — Z8719 Personal history of other diseases of the digestive system: Secondary | ICD-10-CM | POA: Diagnosis not present

## 2021-12-21 DIAGNOSIS — I739 Peripheral vascular disease, unspecified: Secondary | ICD-10-CM | POA: Diagnosis not present

## 2021-12-21 DIAGNOSIS — C189 Malignant neoplasm of colon, unspecified: Secondary | ICD-10-CM | POA: Diagnosis not present

## 2021-12-21 DIAGNOSIS — A809 Acute poliomyelitis, unspecified: Secondary | ICD-10-CM | POA: Diagnosis not present

## 2021-12-21 DIAGNOSIS — E611 Iron deficiency: Secondary | ICD-10-CM | POA: Diagnosis not present

## 2021-12-21 DIAGNOSIS — C154 Malignant neoplasm of middle third of esophagus: Secondary | ICD-10-CM | POA: Diagnosis not present

## 2021-12-21 DIAGNOSIS — K222 Esophageal obstruction: Secondary | ICD-10-CM | POA: Diagnosis not present

## 2021-12-21 DIAGNOSIS — E785 Hyperlipidemia, unspecified: Secondary | ICD-10-CM | POA: Diagnosis not present

## 2021-12-21 DIAGNOSIS — K56609 Unspecified intestinal obstruction, unspecified as to partial versus complete obstruction: Secondary | ICD-10-CM | POA: Diagnosis not present

## 2021-12-21 DIAGNOSIS — Z87891 Personal history of nicotine dependence: Secondary | ICD-10-CM | POA: Diagnosis not present

## 2021-12-21 DIAGNOSIS — Z86010 Personal history of colonic polyps: Secondary | ICD-10-CM | POA: Diagnosis not present

## 2021-12-21 DIAGNOSIS — E039 Hypothyroidism, unspecified: Secondary | ICD-10-CM | POA: Diagnosis not present

## 2021-12-21 DIAGNOSIS — N3281 Overactive bladder: Secondary | ICD-10-CM | POA: Diagnosis not present

## 2021-12-21 DIAGNOSIS — K5900 Constipation, unspecified: Secondary | ICD-10-CM | POA: Diagnosis not present

## 2021-12-21 DIAGNOSIS — R5382 Chronic fatigue, unspecified: Secondary | ICD-10-CM | POA: Diagnosis not present

## 2021-12-21 DIAGNOSIS — I4891 Unspecified atrial fibrillation: Secondary | ICD-10-CM | POA: Diagnosis not present

## 2021-12-21 DIAGNOSIS — R531 Weakness: Secondary | ICD-10-CM | POA: Diagnosis not present

## 2021-12-21 DIAGNOSIS — E860 Dehydration: Secondary | ICD-10-CM | POA: Diagnosis not present

## 2021-12-21 DIAGNOSIS — M8580 Other specified disorders of bone density and structure, unspecified site: Secondary | ICD-10-CM | POA: Diagnosis not present

## 2021-12-21 DIAGNOSIS — K2930 Chronic superficial gastritis without bleeding: Secondary | ICD-10-CM | POA: Diagnosis not present

## 2021-12-21 DIAGNOSIS — F319 Bipolar disorder, unspecified: Secondary | ICD-10-CM | POA: Diagnosis not present

## 2021-12-25 DIAGNOSIS — E039 Hypothyroidism, unspecified: Secondary | ICD-10-CM | POA: Diagnosis not present

## 2021-12-25 DIAGNOSIS — E860 Dehydration: Secondary | ICD-10-CM | POA: Diagnosis not present

## 2021-12-25 DIAGNOSIS — E785 Hyperlipidemia, unspecified: Secondary | ICD-10-CM | POA: Diagnosis not present

## 2021-12-25 DIAGNOSIS — E611 Iron deficiency: Secondary | ICD-10-CM | POA: Diagnosis not present

## 2021-12-25 DIAGNOSIS — K2930 Chronic superficial gastritis without bleeding: Secondary | ICD-10-CM | POA: Diagnosis not present

## 2021-12-25 DIAGNOSIS — M8580 Other specified disorders of bone density and structure, unspecified site: Secondary | ICD-10-CM | POA: Diagnosis not present

## 2021-12-25 DIAGNOSIS — F319 Bipolar disorder, unspecified: Secondary | ICD-10-CM | POA: Diagnosis not present

## 2021-12-25 DIAGNOSIS — C189 Malignant neoplasm of colon, unspecified: Secondary | ICD-10-CM | POA: Diagnosis not present

## 2021-12-25 DIAGNOSIS — R5382 Chronic fatigue, unspecified: Secondary | ICD-10-CM | POA: Diagnosis not present

## 2021-12-25 DIAGNOSIS — C154 Malignant neoplasm of middle third of esophagus: Secondary | ICD-10-CM | POA: Diagnosis not present

## 2021-12-25 DIAGNOSIS — A809 Acute poliomyelitis, unspecified: Secondary | ICD-10-CM | POA: Diagnosis not present

## 2021-12-25 DIAGNOSIS — Z87891 Personal history of nicotine dependence: Secondary | ICD-10-CM | POA: Diagnosis not present

## 2021-12-25 DIAGNOSIS — R531 Weakness: Secondary | ICD-10-CM | POA: Diagnosis not present

## 2021-12-25 DIAGNOSIS — N3281 Overactive bladder: Secondary | ICD-10-CM | POA: Diagnosis not present

## 2021-12-25 DIAGNOSIS — I4891 Unspecified atrial fibrillation: Secondary | ICD-10-CM | POA: Diagnosis not present

## 2021-12-25 DIAGNOSIS — K5900 Constipation, unspecified: Secondary | ICD-10-CM | POA: Diagnosis not present

## 2021-12-25 DIAGNOSIS — I739 Peripheral vascular disease, unspecified: Secondary | ICD-10-CM | POA: Diagnosis not present

## 2021-12-25 DIAGNOSIS — Z86010 Personal history of colonic polyps: Secondary | ICD-10-CM | POA: Diagnosis not present

## 2021-12-25 DIAGNOSIS — K56609 Unspecified intestinal obstruction, unspecified as to partial versus complete obstruction: Secondary | ICD-10-CM | POA: Diagnosis not present

## 2021-12-25 DIAGNOSIS — Z8719 Personal history of other diseases of the digestive system: Secondary | ICD-10-CM | POA: Diagnosis not present

## 2021-12-25 DIAGNOSIS — K222 Esophageal obstruction: Secondary | ICD-10-CM | POA: Diagnosis not present

## 2021-12-25 DIAGNOSIS — K219 Gastro-esophageal reflux disease without esophagitis: Secondary | ICD-10-CM | POA: Diagnosis not present

## 2021-12-25 DIAGNOSIS — N183 Chronic kidney disease, stage 3 unspecified: Secondary | ICD-10-CM | POA: Diagnosis not present

## 2021-12-26 DIAGNOSIS — F319 Bipolar disorder, unspecified: Secondary | ICD-10-CM | POA: Diagnosis not present

## 2021-12-26 DIAGNOSIS — D6869 Other thrombophilia: Secondary | ICD-10-CM | POA: Diagnosis not present

## 2021-12-26 DIAGNOSIS — N183 Chronic kidney disease, stage 3 unspecified: Secondary | ICD-10-CM | POA: Diagnosis not present

## 2021-12-26 DIAGNOSIS — I700 Atherosclerosis of aorta: Secondary | ICD-10-CM | POA: Diagnosis not present

## 2021-12-26 DIAGNOSIS — I4891 Unspecified atrial fibrillation: Secondary | ICD-10-CM | POA: Diagnosis not present

## 2021-12-26 DIAGNOSIS — E039 Hypothyroidism, unspecified: Secondary | ICD-10-CM | POA: Diagnosis not present

## 2021-12-26 DIAGNOSIS — C189 Malignant neoplasm of colon, unspecified: Secondary | ICD-10-CM | POA: Diagnosis not present

## 2021-12-26 DIAGNOSIS — R32 Unspecified urinary incontinence: Secondary | ICD-10-CM | POA: Diagnosis not present

## 2021-12-28 ENCOUNTER — Other Ambulatory Visit (HOSPITAL_COMMUNITY): Payer: Self-pay

## 2021-12-28 ENCOUNTER — Other Ambulatory Visit: Payer: Self-pay | Admitting: Oncology

## 2021-12-28 DIAGNOSIS — R5382 Chronic fatigue, unspecified: Secondary | ICD-10-CM | POA: Diagnosis not present

## 2021-12-28 DIAGNOSIS — C189 Malignant neoplasm of colon, unspecified: Secondary | ICD-10-CM | POA: Diagnosis not present

## 2021-12-28 DIAGNOSIS — K56609 Unspecified intestinal obstruction, unspecified as to partial versus complete obstruction: Secondary | ICD-10-CM | POA: Diagnosis not present

## 2021-12-28 DIAGNOSIS — F319 Bipolar disorder, unspecified: Secondary | ICD-10-CM | POA: Diagnosis not present

## 2021-12-28 DIAGNOSIS — K2930 Chronic superficial gastritis without bleeding: Secondary | ICD-10-CM | POA: Diagnosis not present

## 2021-12-28 DIAGNOSIS — K219 Gastro-esophageal reflux disease without esophagitis: Secondary | ICD-10-CM | POA: Diagnosis not present

## 2021-12-28 DIAGNOSIS — N183 Chronic kidney disease, stage 3 unspecified: Secondary | ICD-10-CM | POA: Diagnosis not present

## 2021-12-28 DIAGNOSIS — C154 Malignant neoplasm of middle third of esophagus: Secondary | ICD-10-CM | POA: Diagnosis not present

## 2021-12-28 DIAGNOSIS — E039 Hypothyroidism, unspecified: Secondary | ICD-10-CM | POA: Diagnosis not present

## 2021-12-28 DIAGNOSIS — E860 Dehydration: Secondary | ICD-10-CM | POA: Diagnosis not present

## 2021-12-28 DIAGNOSIS — Z87891 Personal history of nicotine dependence: Secondary | ICD-10-CM | POA: Diagnosis not present

## 2021-12-28 DIAGNOSIS — E785 Hyperlipidemia, unspecified: Secondary | ICD-10-CM | POA: Diagnosis not present

## 2021-12-28 DIAGNOSIS — K222 Esophageal obstruction: Secondary | ICD-10-CM | POA: Diagnosis not present

## 2021-12-28 DIAGNOSIS — Z8719 Personal history of other diseases of the digestive system: Secondary | ICD-10-CM | POA: Diagnosis not present

## 2021-12-28 DIAGNOSIS — E611 Iron deficiency: Secondary | ICD-10-CM | POA: Diagnosis not present

## 2021-12-28 DIAGNOSIS — Z86010 Personal history of colonic polyps: Secondary | ICD-10-CM | POA: Diagnosis not present

## 2021-12-28 DIAGNOSIS — A809 Acute poliomyelitis, unspecified: Secondary | ICD-10-CM | POA: Diagnosis not present

## 2021-12-28 DIAGNOSIS — M8580 Other specified disorders of bone density and structure, unspecified site: Secondary | ICD-10-CM | POA: Diagnosis not present

## 2021-12-28 DIAGNOSIS — I4891 Unspecified atrial fibrillation: Secondary | ICD-10-CM | POA: Diagnosis not present

## 2021-12-28 DIAGNOSIS — N3281 Overactive bladder: Secondary | ICD-10-CM | POA: Diagnosis not present

## 2021-12-28 DIAGNOSIS — R531 Weakness: Secondary | ICD-10-CM | POA: Diagnosis not present

## 2021-12-28 DIAGNOSIS — K5900 Constipation, unspecified: Secondary | ICD-10-CM | POA: Diagnosis not present

## 2021-12-28 DIAGNOSIS — I739 Peripheral vascular disease, unspecified: Secondary | ICD-10-CM | POA: Diagnosis not present

## 2021-12-28 DIAGNOSIS — C184 Malignant neoplasm of transverse colon: Secondary | ICD-10-CM

## 2021-12-31 ENCOUNTER — Inpatient Hospital Stay: Payer: PPO

## 2021-12-31 ENCOUNTER — Telehealth: Payer: Self-pay

## 2021-12-31 ENCOUNTER — Inpatient Hospital Stay: Payer: PPO | Attending: Nurse Practitioner | Admitting: Oncology

## 2021-12-31 DIAGNOSIS — K5900 Constipation, unspecified: Secondary | ICD-10-CM | POA: Diagnosis not present

## 2021-12-31 DIAGNOSIS — Z8719 Personal history of other diseases of the digestive system: Secondary | ICD-10-CM | POA: Diagnosis not present

## 2021-12-31 DIAGNOSIS — K2930 Chronic superficial gastritis without bleeding: Secondary | ICD-10-CM | POA: Diagnosis not present

## 2021-12-31 DIAGNOSIS — Z7901 Long term (current) use of anticoagulants: Secondary | ICD-10-CM | POA: Insufficient documentation

## 2021-12-31 DIAGNOSIS — K56609 Unspecified intestinal obstruction, unspecified as to partial versus complete obstruction: Secondary | ICD-10-CM | POA: Insufficient documentation

## 2021-12-31 DIAGNOSIS — C154 Malignant neoplasm of middle third of esophagus: Secondary | ICD-10-CM | POA: Diagnosis not present

## 2021-12-31 DIAGNOSIS — I4891 Unspecified atrial fibrillation: Secondary | ICD-10-CM | POA: Diagnosis not present

## 2021-12-31 DIAGNOSIS — C189 Malignant neoplasm of colon, unspecified: Secondary | ICD-10-CM | POA: Diagnosis not present

## 2021-12-31 DIAGNOSIS — K222 Esophageal obstruction: Secondary | ICD-10-CM | POA: Diagnosis not present

## 2021-12-31 DIAGNOSIS — N3281 Overactive bladder: Secondary | ICD-10-CM | POA: Diagnosis not present

## 2021-12-31 DIAGNOSIS — R5382 Chronic fatigue, unspecified: Secondary | ICD-10-CM | POA: Diagnosis not present

## 2021-12-31 DIAGNOSIS — I739 Peripheral vascular disease, unspecified: Secondary | ICD-10-CM | POA: Diagnosis not present

## 2021-12-31 DIAGNOSIS — E860 Dehydration: Secondary | ICD-10-CM | POA: Diagnosis not present

## 2021-12-31 DIAGNOSIS — F319 Bipolar disorder, unspecified: Secondary | ICD-10-CM | POA: Insufficient documentation

## 2021-12-31 DIAGNOSIS — Z86010 Personal history of colonic polyps: Secondary | ICD-10-CM | POA: Diagnosis not present

## 2021-12-31 DIAGNOSIS — R531 Weakness: Secondary | ICD-10-CM | POA: Diagnosis not present

## 2021-12-31 DIAGNOSIS — E611 Iron deficiency: Secondary | ICD-10-CM | POA: Diagnosis not present

## 2021-12-31 DIAGNOSIS — Z87891 Personal history of nicotine dependence: Secondary | ICD-10-CM | POA: Diagnosis not present

## 2021-12-31 DIAGNOSIS — R918 Other nonspecific abnormal finding of lung field: Secondary | ICD-10-CM | POA: Insufficient documentation

## 2021-12-31 DIAGNOSIS — C183 Malignant neoplasm of hepatic flexure: Secondary | ICD-10-CM | POA: Insufficient documentation

## 2021-12-31 DIAGNOSIS — A809 Acute poliomyelitis, unspecified: Secondary | ICD-10-CM | POA: Diagnosis not present

## 2021-12-31 DIAGNOSIS — N183 Chronic kidney disease, stage 3 unspecified: Secondary | ICD-10-CM | POA: Diagnosis not present

## 2021-12-31 DIAGNOSIS — E039 Hypothyroidism, unspecified: Secondary | ICD-10-CM | POA: Diagnosis not present

## 2021-12-31 DIAGNOSIS — E785 Hyperlipidemia, unspecified: Secondary | ICD-10-CM | POA: Diagnosis not present

## 2021-12-31 DIAGNOSIS — M8580 Other specified disorders of bone density and structure, unspecified site: Secondary | ICD-10-CM | POA: Diagnosis not present

## 2021-12-31 DIAGNOSIS — K219 Gastro-esophageal reflux disease without esophagitis: Secondary | ICD-10-CM | POA: Diagnosis not present

## 2021-12-31 NOTE — Telephone Encounter (Signed)
Pt called to inquire about missed appointment today. Pt stated she forgot she had an appointment and apologized informed Pt I will talk to Dr Benay Spice to reschedule appointment. Pt verbalized understanding.

## 2022-01-01 ENCOUNTER — Telehealth: Payer: Self-pay

## 2022-01-01 NOTE — Telephone Encounter (Signed)
Prior Authorization initiated and submitted for TEMAZEPAM 7.5 MG with [employer]

## 2022-01-02 ENCOUNTER — Other Ambulatory Visit (HOSPITAL_COMMUNITY): Payer: Self-pay

## 2022-01-02 NOTE — Telephone Encounter (Signed)
Sees patient on 2/16 and refill be done at that time.

## 2022-01-03 ENCOUNTER — Inpatient Hospital Stay (HOSPITAL_BASED_OUTPATIENT_CLINIC_OR_DEPARTMENT_OTHER): Payer: PPO | Admitting: Oncology

## 2022-01-03 ENCOUNTER — Other Ambulatory Visit (HOSPITAL_COMMUNITY): Payer: Self-pay

## 2022-01-03 ENCOUNTER — Inpatient Hospital Stay: Payer: PPO

## 2022-01-03 ENCOUNTER — Other Ambulatory Visit: Payer: Self-pay

## 2022-01-03 VITALS — BP 150/52 | HR 73 | Temp 97.3°F | Resp 18 | Wt 120.2 lb

## 2022-01-03 DIAGNOSIS — K222 Esophageal obstruction: Secondary | ICD-10-CM | POA: Diagnosis not present

## 2022-01-03 DIAGNOSIS — K56609 Unspecified intestinal obstruction, unspecified as to partial versus complete obstruction: Secondary | ICD-10-CM | POA: Diagnosis not present

## 2022-01-03 DIAGNOSIS — E785 Hyperlipidemia, unspecified: Secondary | ICD-10-CM | POA: Diagnosis not present

## 2022-01-03 DIAGNOSIS — K2930 Chronic superficial gastritis without bleeding: Secondary | ICD-10-CM | POA: Diagnosis not present

## 2022-01-03 DIAGNOSIS — A809 Acute poliomyelitis, unspecified: Secondary | ICD-10-CM | POA: Diagnosis not present

## 2022-01-03 DIAGNOSIS — C189 Malignant neoplasm of colon, unspecified: Secondary | ICD-10-CM | POA: Diagnosis not present

## 2022-01-03 DIAGNOSIS — K219 Gastro-esophageal reflux disease without esophagitis: Secondary | ICD-10-CM | POA: Diagnosis not present

## 2022-01-03 DIAGNOSIS — C184 Malignant neoplasm of transverse colon: Secondary | ICD-10-CM | POA: Diagnosis not present

## 2022-01-03 DIAGNOSIS — Z86010 Personal history of colonic polyps: Secondary | ICD-10-CM | POA: Diagnosis not present

## 2022-01-03 DIAGNOSIS — N183 Chronic kidney disease, stage 3 unspecified: Secondary | ICD-10-CM | POA: Diagnosis not present

## 2022-01-03 DIAGNOSIS — I739 Peripheral vascular disease, unspecified: Secondary | ICD-10-CM | POA: Diagnosis not present

## 2022-01-03 DIAGNOSIS — F319 Bipolar disorder, unspecified: Secondary | ICD-10-CM | POA: Diagnosis not present

## 2022-01-03 DIAGNOSIS — R5382 Chronic fatigue, unspecified: Secondary | ICD-10-CM | POA: Diagnosis not present

## 2022-01-03 DIAGNOSIS — E039 Hypothyroidism, unspecified: Secondary | ICD-10-CM | POA: Diagnosis not present

## 2022-01-03 DIAGNOSIS — R531 Weakness: Secondary | ICD-10-CM | POA: Diagnosis not present

## 2022-01-03 DIAGNOSIS — M8580 Other specified disorders of bone density and structure, unspecified site: Secondary | ICD-10-CM | POA: Diagnosis not present

## 2022-01-03 DIAGNOSIS — C154 Malignant neoplasm of middle third of esophagus: Secondary | ICD-10-CM | POA: Diagnosis not present

## 2022-01-03 DIAGNOSIS — C183 Malignant neoplasm of hepatic flexure: Secondary | ICD-10-CM | POA: Diagnosis not present

## 2022-01-03 DIAGNOSIS — R918 Other nonspecific abnormal finding of lung field: Secondary | ICD-10-CM | POA: Diagnosis not present

## 2022-01-03 DIAGNOSIS — N3281 Overactive bladder: Secondary | ICD-10-CM | POA: Diagnosis not present

## 2022-01-03 DIAGNOSIS — Z87891 Personal history of nicotine dependence: Secondary | ICD-10-CM | POA: Diagnosis not present

## 2022-01-03 DIAGNOSIS — E860 Dehydration: Secondary | ICD-10-CM | POA: Diagnosis not present

## 2022-01-03 DIAGNOSIS — K5900 Constipation, unspecified: Secondary | ICD-10-CM | POA: Diagnosis not present

## 2022-01-03 DIAGNOSIS — Z7901 Long term (current) use of anticoagulants: Secondary | ICD-10-CM | POA: Diagnosis not present

## 2022-01-03 DIAGNOSIS — I4891 Unspecified atrial fibrillation: Secondary | ICD-10-CM | POA: Diagnosis not present

## 2022-01-03 DIAGNOSIS — E611 Iron deficiency: Secondary | ICD-10-CM | POA: Diagnosis not present

## 2022-01-03 DIAGNOSIS — Z8719 Personal history of other diseases of the digestive system: Secondary | ICD-10-CM | POA: Diagnosis not present

## 2022-01-03 LAB — CBC WITH DIFFERENTIAL (CANCER CENTER ONLY)
Abs Immature Granulocytes: 0.01 10*3/uL (ref 0.00–0.07)
Basophils Absolute: 0 10*3/uL (ref 0.0–0.1)
Basophils Relative: 1 %
Eosinophils Absolute: 0.1 10*3/uL (ref 0.0–0.5)
Eosinophils Relative: 2 %
HCT: 34 % — ABNORMAL LOW (ref 36.0–46.0)
Hemoglobin: 10.9 g/dL — ABNORMAL LOW (ref 12.0–15.0)
Immature Granulocytes: 0 %
Lymphocytes Relative: 28 %
Lymphs Abs: 1.5 10*3/uL (ref 0.7–4.0)
MCH: 34.6 pg — ABNORMAL HIGH (ref 26.0–34.0)
MCHC: 32.1 g/dL (ref 30.0–36.0)
MCV: 107.9 fL — ABNORMAL HIGH (ref 80.0–100.0)
Monocytes Absolute: 0.5 10*3/uL (ref 0.1–1.0)
Monocytes Relative: 9 %
Neutro Abs: 3.2 10*3/uL (ref 1.7–7.7)
Neutrophils Relative %: 60 %
Platelet Count: 216 10*3/uL (ref 150–400)
RBC: 3.15 MIL/uL — ABNORMAL LOW (ref 3.87–5.11)
RDW: 18.4 % — ABNORMAL HIGH (ref 11.5–15.5)
WBC Count: 5.3 10*3/uL (ref 4.0–10.5)
nRBC: 0 % (ref 0.0–0.2)

## 2022-01-03 LAB — CMP (CANCER CENTER ONLY)
ALT: 10 U/L (ref 0–44)
AST: 19 U/L (ref 15–41)
Albumin: 3.8 g/dL (ref 3.5–5.0)
Alkaline Phosphatase: 53 U/L (ref 38–126)
Anion gap: 8 (ref 5–15)
BUN: 12 mg/dL (ref 8–23)
CO2: 27 mmol/L (ref 22–32)
Calcium: 8.9 mg/dL (ref 8.9–10.3)
Chloride: 108 mmol/L (ref 98–111)
Creatinine: 0.83 mg/dL (ref 0.44–1.00)
GFR, Estimated: >60 mL/min (ref >60)
Glucose, Bld: 108 mg/dL — ABNORMAL HIGH (ref 70–99)
Potassium: 3.8 mmol/L (ref 3.5–5.1)
Sodium: 143 mmol/L (ref 135–145)
Total Bilirubin: 0.6 mg/dL (ref 0.3–1.2)
Total Protein: 6 g/dL — ABNORMAL LOW (ref 6.5–8.1)

## 2022-01-03 MED ORDER — CAPECITABINE 500 MG PO TABS
ORAL_TABLET | ORAL | 0 refills | Status: DC
  Filled 2022-01-03: qty 70, 21d supply, fill #0

## 2022-01-03 NOTE — Progress Notes (Signed)
[place] OFFICE PROGRESS NOTE   Diagnosis: Colon cancer  INTERVAL HISTORY:   Kaitlyn Hoover completed another cycle of Xeloda beginning 12/17/2021.  No mouth sores, nausea, diarrhea, or hand/foot pain.  She reports a poor appetite.  Objective:  Vital signs in last 24 hours:  Blood pressure (!) 150/52, pulse 73, temperature (!) 97.3 F (36.3 C), temperature source Oral, resp. rate 18, weight 120 lb 3.2 oz (54.5 kg), SpO2 99 %.    HEENT: No thrush or ulcers Resp: Lungs clear bilaterally Cardio: Regular rate and rhythm GI: No hepatosplenomegaly Vascular: No leg edema  Skin: Palms and soles without erythema  Lab Results:  Lab Results  Component Value Date   WBC 5.3 01/03/2022   HGB 10.9 (L) 01/03/2022   HCT 34.0 (L) 01/03/2022   MCV 107.9 (H) 01/03/2022   PLT 216 01/03/2022   NEUTROABS 3.2 01/03/2022    CMP  Lab Results  Component Value Date   NA 143 12/05/2021   K 4.1 12/05/2021   CL 108 12/05/2021   CO2 27 12/05/2021   GLUCOSE 109 (H) 12/05/2021   BUN 12 12/05/2021   CREATININE 0.98 12/05/2021   CALCIUM 9.3 12/05/2021   PROT 6.1 (L) 12/05/2021   ALBUMIN 3.3 (L) 12/05/2021   AST 26 12/05/2021   ALT 18 12/05/2021   ALKPHOS 73 12/05/2021   BILITOT 0.5 12/05/2021   GFRNONAA 59 (L) 12/05/2021   GFRAA 53 (L) 11/02/2020    Lab Results  Component Value Date   CEA1 5.4 (H) 08/06/2021   CEA 15.68 (H) 11/29/2021     Medications: I have reviewed the patient's current medications.   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 Tumor invades through the serosa, 3/25 lymph nodes, MSS, no loss of mismatch repair protein expression CTs 06/26/2021-eccentric wall thickening of the hepatic flexure, tiny pulmonary nodules measuring up to 2 mm-likely infectious or inflammatory Elevated preoperative CEA Cycle 1 Xeloda 09/10/2021 Cycle 2 Xeloda 10/01/2021 Cycle 3 Xeloda 10/22/2021 Cycle 4  Xeloda 11/12/2021-last dose 11/16/2021, hospitalized in [place] 11/18/2021 with small bowel obstruction Cycle 5 Xeloda 12/17/2021 Cycle 6 Xeloda 01/07/2022 History of colon polyps including on the right colectomy specimen 08/15/2021 Bipolar disease Atrial fibrillation maintained on anticoagulation Subcutaneous lesion right lower chest inferior to the breast-possibly a lipoma Admission to the hospital in [place] 11/18/2021 with nausea/vomiting/diarrhea.  Diagnosed with small bowel obstruction.  Discharged home 11/25/2021.    Disposition: Kaitlyn Hoover tolerated the last cycle of Xeloda well.  She will complete another cycle beginning 01/07/2022.  She will discontinue Xeloda and contact us if she develops nausea or diarrhea.  The CEA has been persistently elevated.  We will check the CEA when she returns in 3 weeks.  We will refer her for restaging CTs if the CEA becomes more elevated.  [name], MD  01/03/2022  4:14 PM

## 2022-01-04 ENCOUNTER — Other Ambulatory Visit: Payer: Self-pay | Admitting: *Deleted

## 2022-01-04 ENCOUNTER — Telehealth: Payer: Self-pay

## 2022-01-04 ENCOUNTER — Telehealth: Payer: Self-pay | Admitting: Oncology

## 2022-01-04 ENCOUNTER — Other Ambulatory Visit (HOSPITAL_COMMUNITY): Payer: Self-pay

## 2022-01-04 DIAGNOSIS — C184 Malignant neoplasm of transverse colon: Secondary | ICD-10-CM

## 2022-01-04 MED ORDER — CAPECITABINE 500 MG PO TABS
ORAL_TABLET | ORAL | 0 refills | Status: DC
  Filled 2022-01-04: qty 70, 21d supply, fill #0

## 2022-01-04 NOTE — Telephone Encounter (Signed)
Attempted to contact patient to schedule next follow up appointments from yesterday office visit. No answer so a voicemail was left for patient to call back.

## 2022-01-04 NOTE — Telephone Encounter (Signed)
TC from Pt stating she forgot to report that she is having hair loss. Pt states Dr Benay Spice informed her she wouldn't have hair loss. Dr Benay Spice informed.

## 2022-01-07 ENCOUNTER — Other Ambulatory Visit: Payer: Self-pay | Admitting: Psychiatry

## 2022-01-07 DIAGNOSIS — Z86010 Personal history of colonic polyps: Secondary | ICD-10-CM | POA: Diagnosis not present

## 2022-01-07 DIAGNOSIS — N183 Chronic kidney disease, stage 3 unspecified: Secondary | ICD-10-CM | POA: Diagnosis not present

## 2022-01-07 DIAGNOSIS — C189 Malignant neoplasm of colon, unspecified: Secondary | ICD-10-CM | POA: Diagnosis not present

## 2022-01-07 DIAGNOSIS — Z87891 Personal history of nicotine dependence: Secondary | ICD-10-CM | POA: Diagnosis not present

## 2022-01-07 DIAGNOSIS — K5900 Constipation, unspecified: Secondary | ICD-10-CM | POA: Diagnosis not present

## 2022-01-07 DIAGNOSIS — R531 Weakness: Secondary | ICD-10-CM | POA: Diagnosis not present

## 2022-01-07 DIAGNOSIS — E860 Dehydration: Secondary | ICD-10-CM | POA: Diagnosis not present

## 2022-01-07 DIAGNOSIS — E785 Hyperlipidemia, unspecified: Secondary | ICD-10-CM | POA: Diagnosis not present

## 2022-01-07 DIAGNOSIS — I739 Peripheral vascular disease, unspecified: Secondary | ICD-10-CM | POA: Diagnosis not present

## 2022-01-07 DIAGNOSIS — R5382 Chronic fatigue, unspecified: Secondary | ICD-10-CM | POA: Diagnosis not present

## 2022-01-07 DIAGNOSIS — K222 Esophageal obstruction: Secondary | ICD-10-CM | POA: Diagnosis not present

## 2022-01-07 DIAGNOSIS — C154 Malignant neoplasm of middle third of esophagus: Secondary | ICD-10-CM | POA: Diagnosis not present

## 2022-01-07 DIAGNOSIS — F5105 Insomnia due to other mental disorder: Secondary | ICD-10-CM

## 2022-01-07 DIAGNOSIS — K219 Gastro-esophageal reflux disease without esophagitis: Secondary | ICD-10-CM | POA: Diagnosis not present

## 2022-01-07 DIAGNOSIS — N3281 Overactive bladder: Secondary | ICD-10-CM | POA: Diagnosis not present

## 2022-01-07 DIAGNOSIS — K2930 Chronic superficial gastritis without bleeding: Secondary | ICD-10-CM | POA: Diagnosis not present

## 2022-01-07 DIAGNOSIS — E039 Hypothyroidism, unspecified: Secondary | ICD-10-CM | POA: Diagnosis not present

## 2022-01-07 DIAGNOSIS — I4891 Unspecified atrial fibrillation: Secondary | ICD-10-CM | POA: Diagnosis not present

## 2022-01-07 DIAGNOSIS — K56609 Unspecified intestinal obstruction, unspecified as to partial versus complete obstruction: Secondary | ICD-10-CM | POA: Diagnosis not present

## 2022-01-07 DIAGNOSIS — E611 Iron deficiency: Secondary | ICD-10-CM | POA: Diagnosis not present

## 2022-01-07 DIAGNOSIS — A809 Acute poliomyelitis, unspecified: Secondary | ICD-10-CM | POA: Diagnosis not present

## 2022-01-07 DIAGNOSIS — Z8719 Personal history of other diseases of the digestive system: Secondary | ICD-10-CM | POA: Diagnosis not present

## 2022-01-07 DIAGNOSIS — F319 Bipolar disorder, unspecified: Secondary | ICD-10-CM | POA: Diagnosis not present

## 2022-01-07 DIAGNOSIS — M8580 Other specified disorders of bone density and structure, unspecified site: Secondary | ICD-10-CM | POA: Diagnosis not present

## 2022-01-10 ENCOUNTER — Other Ambulatory Visit: Payer: Self-pay | Admitting: Family Medicine

## 2022-01-10 DIAGNOSIS — M8580 Other specified disorders of bone density and structure, unspecified site: Secondary | ICD-10-CM

## 2022-01-11 NOTE — Telephone Encounter (Signed)
Prior Approval received for TEMAZEPAM 7.5 MG effective 01/03/2022-11/17/2022 with [employer].

## 2022-01-15 ENCOUNTER — Telehealth: Payer: Self-pay

## 2022-01-15 NOTE — Telephone Encounter (Signed)
TC from [facility] with [facility] [phone] inquiring if Pt needs colonoscopy appointment scheduled. Left v/m for Freda Munro, stating Dr Benay Spice did tell Pt she did not have to do the colonoscopy.

## 2022-01-16 ENCOUNTER — Other Ambulatory Visit: Payer: Self-pay | Admitting: Oncology

## 2022-01-16 ENCOUNTER — Other Ambulatory Visit (HOSPITAL_COMMUNITY): Payer: Self-pay

## 2022-01-16 DIAGNOSIS — C184 Malignant neoplasm of transverse colon: Secondary | ICD-10-CM

## 2022-01-16 MED ORDER — CAPECITABINE 500 MG PO TABS
ORAL_TABLET | ORAL | 0 refills | Status: DC
  Filled 2022-01-23: qty 70, 21d supply, fill #0

## 2022-01-18 ENCOUNTER — Other Ambulatory Visit (HOSPITAL_COMMUNITY): Payer: Self-pay

## 2022-01-23 ENCOUNTER — Other Ambulatory Visit (HOSPITAL_COMMUNITY): Payer: Self-pay

## 2022-01-24 ENCOUNTER — Inpatient Hospital Stay: Payer: PPO | Attending: Nurse Practitioner

## 2022-01-24 ENCOUNTER — Other Ambulatory Visit: Payer: Self-pay

## 2022-01-24 ENCOUNTER — Encounter: Payer: Self-pay | Admitting: Nurse Practitioner

## 2022-01-24 ENCOUNTER — Other Ambulatory Visit (HOSPITAL_COMMUNITY): Payer: Self-pay

## 2022-01-24 ENCOUNTER — Inpatient Hospital Stay (HOSPITAL_BASED_OUTPATIENT_CLINIC_OR_DEPARTMENT_OTHER): Payer: PPO | Admitting: Nurse Practitioner

## 2022-01-24 VITALS — BP 140/50 | HR 78 | Temp 97.8°F | Resp 20 | Ht 62.0 in | Wt 118.3 lb

## 2022-01-24 DIAGNOSIS — C184 Malignant neoplasm of transverse colon: Secondary | ICD-10-CM

## 2022-01-24 DIAGNOSIS — R918 Other nonspecific abnormal finding of lung field: Secondary | ICD-10-CM | POA: Insufficient documentation

## 2022-01-24 DIAGNOSIS — Z8719 Personal history of other diseases of the digestive system: Secondary | ICD-10-CM | POA: Diagnosis not present

## 2022-01-24 DIAGNOSIS — K56609 Unspecified intestinal obstruction, unspecified as to partial versus complete obstruction: Secondary | ICD-10-CM | POA: Insufficient documentation

## 2022-01-24 DIAGNOSIS — I4891 Unspecified atrial fibrillation: Secondary | ICD-10-CM | POA: Diagnosis not present

## 2022-01-24 DIAGNOSIS — F319 Bipolar disorder, unspecified: Secondary | ICD-10-CM | POA: Insufficient documentation

## 2022-01-24 DIAGNOSIS — C183 Malignant neoplasm of hepatic flexure: Secondary | ICD-10-CM | POA: Diagnosis not present

## 2022-01-24 DIAGNOSIS — Z7901 Long term (current) use of anticoagulants: Secondary | ICD-10-CM | POA: Diagnosis not present

## 2022-01-24 LAB — CBC WITH DIFFERENTIAL (CANCER CENTER ONLY)
Abs Immature Granulocytes: 0.01 10*3/uL (ref 0.00–0.07)
Basophils Absolute: 0 10*3/uL (ref 0.0–0.1)
Basophils Relative: 1 %
Eosinophils Absolute: 0.1 10*3/uL (ref 0.0–0.5)
Eosinophils Relative: 2 %
HCT: 34 % — ABNORMAL LOW (ref 36.0–46.0)
Hemoglobin: 11.4 g/dL — ABNORMAL LOW (ref 12.0–15.0)
Immature Granulocytes: 0 %
Lymphocytes Relative: 26 %
Lymphs Abs: 1.5 10*3/uL (ref 0.7–4.0)
MCH: 35.3 pg — ABNORMAL HIGH (ref 26.0–34.0)
MCHC: 33.5 g/dL (ref 30.0–36.0)
MCV: 105.3 fL — ABNORMAL HIGH (ref 80.0–100.0)
Monocytes Absolute: 0.5 10*3/uL (ref 0.1–1.0)
Monocytes Relative: 8 %
Neutro Abs: 3.8 10*3/uL (ref 1.7–7.7)
Neutrophils Relative %: 63 %
Platelet Count: 209 10*3/uL (ref 150–400)
RBC: 3.23 MIL/uL — ABNORMAL LOW (ref 3.87–5.11)
RDW: 16.9 % — ABNORMAL HIGH (ref 11.5–15.5)
WBC Count: 6 10*3/uL (ref 4.0–10.5)
nRBC: 0 % (ref 0.0–0.2)

## 2022-01-24 LAB — CMP (CANCER CENTER ONLY)
ALT: 10 U/L (ref 0–44)
AST: 22 U/L (ref 15–41)
Albumin: 3.7 g/dL (ref 3.5–5.0)
Alkaline Phosphatase: 54 U/L (ref 38–126)
Anion gap: 8 (ref 5–15)
BUN: 14 mg/dL (ref 8–23)
CO2: 27 mmol/L (ref 22–32)
Calcium: 9.7 mg/dL (ref 8.9–10.3)
Chloride: 108 mmol/L (ref 98–111)
Creatinine: 1.04 mg/dL — ABNORMAL HIGH (ref 0.44–1.00)
GFR, Estimated: 54 mL/min — ABNORMAL LOW (ref >60)
Glucose, Bld: 111 mg/dL — ABNORMAL HIGH (ref 70–99)
Potassium: 3.6 mmol/L (ref 3.5–5.1)
Sodium: 143 mmol/L (ref 135–145)
Total Bilirubin: 0.8 mg/dL (ref 0.3–1.2)
Total Protein: 6.4 g/dL — ABNORMAL LOW (ref 6.5–8.1)

## 2022-01-24 LAB — CEA (ACCESS): CEA (CHCC): 9.29 ng/mL — ABNORMAL HIGH (ref 0.00–5.00)

## 2022-01-24 NOTE — Progress Notes (Addendum)
?  [place] ?OFFICE PROGRESS NOTE ? ? ?Diagnosis: Colon cancer ? ?INTERVAL HISTORY:  ? ?Kaitlyn Hoover returns as scheduled.  She completed cycle 6 Xeloda beginning 01/07/2022.  She denies nausea/vomiting.  No mouth sores.  No diarrhea.  No hand or foot pain or redness.  She reports hair thinning. ? ?Objective: ? ?Vital signs in last 24 hours: ? ?Blood pressure (!) 140/50, pulse 78, temperature 97.8 ?F (36.6 ?C), temperature source Oral, resp. rate 20, height _0  (1.575 m), weight 118 lb 4.8 oz (53.7 kg), SpO2 98 %. ?  ? ?HEENT: No thrush or ulcers. ?Resp: Lungs clear bilaterally. ?Cardio: Regular rate and rhythm. ?GI: Abdomen soft and nontender.  No hepatomegaly. ?Vascular: No leg edema. ?Skin: Palms without erythema. ? ? ?Lab Results: ? ?Lab Results  ?Component Value Date  ? WBC 6.0 01/24/2022  ? HGB 11.4 (L) 01/24/2022  ? HCT 34.0 (L) 01/24/2022  ? MCV 105.3 (H) 01/24/2022  ? PLT 209 01/24/2022  ? NEUTROABS 3.8 01/24/2022  ? ? ?Imaging: ? ?No results found. ? ?Medications: I have reviewed the patient's current medications. ? 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 ?Tumor invades through the serosa, 3/25 lymph nodes, MSS, no loss of mismatch repair protein expression ?CTs 06/26/2021-eccentric wall thickening of the hepatic flexure, tiny pulmonary nodules measuring up to 2 mm-likely infectious or inflammatory ?Elevated preoperative CEA ?Cycle 1 Xeloda 09/10/2021 ?Cycle 2 Xeloda 10/01/2021 ?Cycle 3 Xeloda 10/22/2021 ?Cycle 4 Xeloda 11/12/2021-last dose 11/16/2021, hospitalized in [place] 11/18/2021 with small bowel obstruction ?Cycle 5 Xeloda 12/17/2021 ?Cycle 6 Xeloda 01/07/2022 ?Cycle 7 Xeloda 01/28/2022 ?History of colon polyps including on the right colectomy specimen 08/15/2021 ?Bipolar disease ?Atrial fibrillation maintained on anticoagulation ?Subcutaneous lesion right lower chest inferior to the breast-possibly a  lipoma ?Admission to the hospital in [place] 11/18/2021 with nausea/vomiting/diarrhea.  Diagnosed with small bowel obstruction.  Discharged home 11/25/2021. ? ?Disposition: Kaitlyn Hoover appears stable.  She has completed 6 cycles of adjuvant Xeloda.  She seems to be tolerating well.  Plan to proceed with cycle 7 as scheduled beginning 01/28/2022. ? ?We reviewed the CBC and chemistry panel from today.  Labs adequate to proceed as above. ? ?We discussed the hair thinning possibly being related to Xeloda.  She understands the thinning could progress.  She agrees to continue with Xeloda as above. ? ?She will return for lab and follow-up in 3 weeks.  Plan for CTs once she has completed adjuvant therapy.  She will contact the office prior to the next visit with any problems. ? ? ? ?Ned Card ANP/GNP-BC  ? ?01/24/2022  ?1:54 PM ? ? ? ? ? ? ? ?

## 2022-01-31 ENCOUNTER — Other Ambulatory Visit: Payer: Self-pay | Admitting: Family Medicine

## 2022-01-31 DIAGNOSIS — Z1231 Encounter for screening mammogram for malignant neoplasm of breast: Secondary | ICD-10-CM

## 2022-02-04 ENCOUNTER — Other Ambulatory Visit (HOSPITAL_COMMUNITY): Payer: Self-pay

## 2022-02-05 ENCOUNTER — Ambulatory Visit
Admission: RE | Admit: 2022-02-05 | Discharge: 2022-02-05 | Disposition: A | Discharge status: Home / Self Care | Payer: PPO | Source: Ambulatory Visit | Attending: Family Medicine | Admitting: Family Medicine

## 2022-02-05 DIAGNOSIS — Z1231 Encounter for screening mammogram for malignant neoplasm of breast: Secondary | ICD-10-CM

## 2022-02-07 ENCOUNTER — Other Ambulatory Visit (HOSPITAL_COMMUNITY): Payer: Self-pay

## 2022-02-07 ENCOUNTER — Other Ambulatory Visit: Payer: Self-pay | Admitting: Oncology

## 2022-02-07 DIAGNOSIS — C184 Malignant neoplasm of transverse colon: Secondary | ICD-10-CM

## 2022-02-07 MED ORDER — CAPECITABINE 500 MG PO TABS
ORAL_TABLET | ORAL | 0 refills | Status: DC
  Filled 2022-02-07: qty 70, 21d supply, fill #0

## 2022-02-12 ENCOUNTER — Other Ambulatory Visit (HOSPITAL_COMMUNITY): Payer: Self-pay

## 2022-02-12 DIAGNOSIS — N182 Chronic kidney disease, stage 2 (mild): Secondary | ICD-10-CM | POA: Diagnosis not present

## 2022-02-12 DIAGNOSIS — E785 Hyperlipidemia, unspecified: Secondary | ICD-10-CM | POA: Diagnosis not present

## 2022-02-12 DIAGNOSIS — K219 Gastro-esophageal reflux disease without esophagitis: Secondary | ICD-10-CM | POA: Diagnosis not present

## 2022-02-15 ENCOUNTER — Inpatient Hospital Stay: Payer: PPO

## 2022-02-15 ENCOUNTER — Inpatient Hospital Stay (HOSPITAL_BASED_OUTPATIENT_CLINIC_OR_DEPARTMENT_OTHER): Payer: PPO | Admitting: Oncology

## 2022-02-15 VITALS — BP 132/50 | HR 64 | Temp 97.8°F | Resp 18 | Ht 62.0 in | Wt 120.0 lb

## 2022-02-15 DIAGNOSIS — C184 Malignant neoplasm of transverse colon: Secondary | ICD-10-CM

## 2022-02-15 DIAGNOSIS — C183 Malignant neoplasm of hepatic flexure: Secondary | ICD-10-CM | POA: Diagnosis not present

## 2022-02-15 LAB — CBC WITH DIFFERENTIAL (CANCER CENTER ONLY)
Abs Immature Granulocytes: 0.01 10*3/uL (ref 0.00–0.07)
Basophils Absolute: 0 10*3/uL (ref 0.0–0.1)
Basophils Relative: 1 %
Eosinophils Absolute: 0.1 10*3/uL (ref 0.0–0.5)
Eosinophils Relative: 2 %
HCT: 36.2 % (ref 36.0–46.0)
Hemoglobin: 12.1 g/dL (ref 12.0–15.0)
Immature Granulocytes: 0 %
Lymphocytes Relative: 20 %
Lymphs Abs: 1.2 10*3/uL (ref 0.7–4.0)
MCH: 35.3 pg — ABNORMAL HIGH (ref 26.0–34.0)
MCHC: 33.4 g/dL (ref 30.0–36.0)
MCV: 105.5 fL — ABNORMAL HIGH (ref 80.0–100.0)
Monocytes Absolute: 0.5 10*3/uL (ref 0.1–1.0)
Monocytes Relative: 9 %
Neutro Abs: 3.8 10*3/uL (ref 1.7–7.7)
Neutrophils Relative %: 68 %
Platelet Count: 185 10*3/uL (ref 150–400)
RBC: 3.43 MIL/uL — ABNORMAL LOW (ref 3.87–5.11)
RDW: 16.9 % — ABNORMAL HIGH (ref 11.5–15.5)
WBC Count: 5.6 10*3/uL (ref 4.0–10.5)
nRBC: 0 % (ref 0.0–0.2)

## 2022-02-15 LAB — CMP (CANCER CENTER ONLY)
ALT: 10 U/L (ref 0–44)
AST: 21 U/L (ref 15–41)
Albumin: 4 g/dL (ref 3.5–5.0)
Alkaline Phosphatase: 64 U/L (ref 38–126)
Anion gap: 6 (ref 5–15)
BUN: 15 mg/dL (ref 8–23)
CO2: 29 mmol/L (ref 22–32)
Calcium: 9.4 mg/dL (ref 8.9–10.3)
Chloride: 107 mmol/L (ref 98–111)
Creatinine: 1.21 mg/dL — ABNORMAL HIGH (ref 0.44–1.00)
GFR, Estimated: 45 mL/min — ABNORMAL LOW (ref >60)
Glucose, Bld: 109 mg/dL — ABNORMAL HIGH (ref 70–99)
Potassium: 3.9 mmol/L (ref 3.5–5.1)
Sodium: 142 mmol/L (ref 135–145)
Total Bilirubin: 0.8 mg/dL (ref 0.3–1.2)
Total Protein: 6.5 g/dL (ref 6.5–8.1)

## 2022-02-15 NOTE — Progress Notes (Signed)
?  [place] ?OFFICE PROGRESS NOTE ? ? ?Diagnosis: Colon cancer ? ?INTERVAL HISTORY:  ? ?Kaitlyn Hoover completed another cycle of Xeloda beginning 01/29/2019.  No mouth sores, nausea, diarrhea, or hand/foot pain.  No complaint. ? ?Objective: ? ?Vital signs in last 24 hours: ? ?Blood pressure (!) 132/50, pulse 64, temperature 97.8 ?F (36.6 ?C), temperature source Oral, resp. rate 18, height $RemoveBe'5\' 2"'CDkbIbkVE$  (1.575 m), weight 120 lb (54.4 kg), SpO2 100 %. ?  ? ?HEENT: No thrush or ulcers ?Resp: Lungs clear bilaterally ?Cardio: Regular rate and rhythm ?GI: No hepatosplenomegaly ?Vascular: No leg edema  ?Skin: Palms without erythema, dryness of the soles, no erythema or skin breakdown ? ? ?Lab Results: ? ?Lab Results  ?Component Value Date  ? WBC 5.6 02/15/2022  ? HGB 12.1 02/15/2022  ? HCT 36.2 02/15/2022  ? MCV 105.5 (H) 02/15/2022  ? PLT 185 02/15/2022  ? NEUTROABS 3.8 02/15/2022  ? ? ?CMP  ?Lab Results  ?Component Value Date  ? NA 142 02/15/2022  ? K 3.9 02/15/2022  ? CL 107 02/15/2022  ? CO2 29 02/15/2022  ? GLUCOSE 109 (H) 02/15/2022  ? BUN 15 02/15/2022  ? CREATININE 1.21 (H) 02/15/2022  ? CALCIUM 9.4 02/15/2022  ? PROT 6.5 02/15/2022  ? ALBUMIN 4.0 02/15/2022  ? AST 21 02/15/2022  ? ALT 10 02/15/2022  ? ALKPHOS 64 02/15/2022  ? BILITOT 0.8 02/15/2022  ? GFRNONAA 45 (L) 02/15/2022  ? GFRAA 53 (L) 11/02/2020  ? ? ?Lab Results  ?Component Value Date  ? CEA1 5.4 (H) 08/06/2021  ? CEA 9.29 (H) 01/24/2022  ? ? ? ? ?Medications: I have reviewed the patient's current medications. ? ? 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 ?Tumor invades through the serosa, 3/25 lymph nodes, MSS, no loss of mismatch repair protein expression ?CTs 06/26/2021-eccentric wall thickening of the hepatic flexure, tiny pulmonary nodules measuring up to 2 mm-likely infectious or inflammatory ?Elevated preoperative CEA ?Cycle 1 Xeloda 09/10/2021 ?Cycle 2 Xeloda  10/01/2021 ?Cycle 3 Xeloda 10/22/2021 ?Cycle 4 Xeloda 11/12/2021-last dose 11/16/2021, hospitalized in [place] 11/18/2021 with small bowel obstruction ?Cycle 5 Xeloda 12/17/2021 ?Cycle 6 Xeloda 01/07/2022 ?Cycle 7 Xeloda 01/28/2022 ?Cycle 8 Xeloda 02/18/2022 ?History of colon polyps including on the right colectomy specimen 08/15/2021 ?Bipolar disease ?Atrial fibrillation maintained on anticoagulation ?Subcutaneous lesion right lower chest inferior to the breast-possibly a lipoma ?Admission to the hospital in [place] 11/18/2021 with nausea/vomiting/diarrhea.  Diagnosed with small bowel obstruction.  Discharged home 11/25/2021. ? ? ? ?Disposition: ?Kaitlyn Hoover has completed 7 cycles of adjuvant Xeloda.  She is tolerating the Xeloda well.  She will complete cycle 8 beginning 02/18/2022.  She will return for an office and lab visit in 3-4 weeks. ? ?The CEA remains mildly elevated.  We will refer her for restaging CTs if the CEA is elevated when she is here next month. ? ? ? ?[name], MD ? ?02/15/2022  ?12:23 PM ? ? ?

## 2022-03-01 DIAGNOSIS — Z0000 Encounter for general adult medical examination without abnormal findings: Secondary | ICD-10-CM | POA: Diagnosis not present

## 2022-03-01 DIAGNOSIS — E039 Hypothyroidism, unspecified: Secondary | ICD-10-CM | POA: Diagnosis not present

## 2022-03-01 DIAGNOSIS — E785 Hyperlipidemia, unspecified: Secondary | ICD-10-CM | POA: Diagnosis not present

## 2022-03-01 DIAGNOSIS — C184 Malignant neoplasm of transverse colon: Secondary | ICD-10-CM | POA: Diagnosis not present

## 2022-03-01 DIAGNOSIS — R32 Unspecified urinary incontinence: Secondary | ICD-10-CM | POA: Diagnosis not present

## 2022-03-04 ENCOUNTER — Other Ambulatory Visit (HOSPITAL_COMMUNITY): Payer: Self-pay

## 2022-03-05 ENCOUNTER — Other Ambulatory Visit: Payer: Self-pay

## 2022-03-06 NOTE — Telephone Encounter (Signed)
Prescription refill request for Eliquis received. ?Indication: PAF ?Last office visit: 11/08/21  Elliot Cousin MD ?Scr:1.21 on 02/15/22 ?Age: 81 ?Weight: 57.2kg ? ?Based on above findings Eliquis 2.'5mg'$  twice daily would be the appropriate dose.  Will send message to Dr Curt Bears to see if he would like to reduce dose.  Await refill till after reply. ?

## 2022-03-07 MED ORDER — APIXABAN 2.5 MG PO TABS: 2.5000 mg | ORAL_TABLET | Freq: Two times a day (BID) | ORAL | 5 refills | Status: DC

## 2022-03-07 NOTE — Telephone Encounter (Signed)
Dr Curt Bears approved Eliquis dose decrease.  Called patient and LMOM with dose change.  Rx sent to pharmacy. ?

## 2022-03-14 ENCOUNTER — Inpatient Hospital Stay: Payer: PPO | Attending: Nurse Practitioner

## 2022-03-14 ENCOUNTER — Telehealth: Payer: Self-pay

## 2022-03-14 ENCOUNTER — Encounter: Payer: Self-pay | Admitting: Nurse Practitioner

## 2022-03-14 ENCOUNTER — Inpatient Hospital Stay (HOSPITAL_BASED_OUTPATIENT_CLINIC_OR_DEPARTMENT_OTHER): Payer: PPO | Admitting: Nurse Practitioner

## 2022-03-14 ENCOUNTER — Other Ambulatory Visit (HOSPITAL_COMMUNITY): Payer: Self-pay

## 2022-03-14 VITALS — BP 137/52 | HR 69 | Temp 98.2°F | Resp 18 | Ht 62.0 in | Wt 120.2 lb

## 2022-03-14 DIAGNOSIS — K56609 Unspecified intestinal obstruction, unspecified as to partial versus complete obstruction: Secondary | ICD-10-CM | POA: Diagnosis not present

## 2022-03-14 DIAGNOSIS — C184 Malignant neoplasm of transverse colon: Secondary | ICD-10-CM

## 2022-03-14 DIAGNOSIS — I4891 Unspecified atrial fibrillation: Secondary | ICD-10-CM | POA: Diagnosis not present

## 2022-03-14 DIAGNOSIS — C183 Malignant neoplasm of hepatic flexure: Secondary | ICD-10-CM | POA: Diagnosis not present

## 2022-03-14 DIAGNOSIS — F319 Bipolar disorder, unspecified: Secondary | ICD-10-CM | POA: Insufficient documentation

## 2022-03-14 DIAGNOSIS — Z7901 Long term (current) use of anticoagulants: Secondary | ICD-10-CM | POA: Diagnosis not present

## 2022-03-14 DIAGNOSIS — Z8719 Personal history of other diseases of the digestive system: Secondary | ICD-10-CM | POA: Insufficient documentation

## 2022-03-14 LAB — CBC WITH DIFFERENTIAL (CANCER CENTER ONLY)
Abs Immature Granulocytes: 0.01 10*3/uL (ref 0.00–0.07)
Basophils Absolute: 0.1 10*3/uL (ref 0.0–0.1)
Basophils Relative: 1 %
Eosinophils Absolute: 0.1 10*3/uL (ref 0.0–0.5)
Eosinophils Relative: 1 %
HCT: 36.9 % (ref 36.0–46.0)
Hemoglobin: 12.4 g/dL (ref 12.0–15.0)
Immature Granulocytes: 0 %
Lymphocytes Relative: 21 %
Lymphs Abs: 1.4 10*3/uL (ref 0.7–4.0)
MCH: 36 pg — ABNORMAL HIGH (ref 26.0–34.0)
MCHC: 33.6 g/dL (ref 30.0–36.0)
MCV: 107.3 fL — ABNORMAL HIGH (ref 80.0–100.0)
Monocytes Absolute: 0.5 10*3/uL (ref 0.1–1.0)
Monocytes Relative: 7 %
Neutro Abs: 4.5 10*3/uL (ref 1.7–7.7)
Neutrophils Relative %: 70 %
Platelet Count: 217 10*3/uL (ref 150–400)
RBC: 3.44 MIL/uL — ABNORMAL LOW (ref 3.87–5.11)
RDW: 17 % — ABNORMAL HIGH (ref 11.5–15.5)
WBC Count: 6.4 10*3/uL (ref 4.0–10.5)
nRBC: 0 % (ref 0.0–0.2)

## 2022-03-14 LAB — CMP (CANCER CENTER ONLY)
ALT: 10 U/L (ref 0–44)
AST: 23 U/L (ref 15–41)
Albumin: 4.2 g/dL (ref 3.5–5.0)
Alkaline Phosphatase: 72 U/L (ref 38–126)
Anion gap: 8 (ref 5–15)
BUN: 23 mg/dL (ref 8–23)
CO2: 26 mmol/L (ref 22–32)
Calcium: 9.1 mg/dL (ref 8.9–10.3)
Chloride: 107 mmol/L (ref 98–111)
Creatinine: 1.21 mg/dL — ABNORMAL HIGH (ref 0.44–1.00)
GFR, Estimated: 45 mL/min — ABNORMAL LOW (ref >60)
Glucose, Bld: 108 mg/dL — ABNORMAL HIGH (ref 70–99)
Potassium: 4.4 mmol/L (ref 3.5–5.1)
Sodium: 141 mmol/L (ref 135–145)
Total Bilirubin: 0.9 mg/dL (ref 0.3–1.2)
Total Protein: 7.1 g/dL (ref 6.5–8.1)

## 2022-03-14 LAB — CEA (ACCESS): CEA (CHCC): 11.76 ng/mL — ABNORMAL HIGH (ref 0.00–5.00)

## 2022-03-14 NOTE — Progress Notes (Signed)
?  [place] ?OFFICE PROGRESS NOTE ? ? ?Diagnosis: Colon cancer ? ?INTERVAL HISTORY:  ? ?[name] returns as scheduled.  She completed cycle 8 Xeloda beginning 02/18/2022.  She denies nausea/vomiting.  No mouth sores.  No diarrhea.  No hand or foot pain or redness.  She continues to note hair loss.  In general she is feeling well. ? ?Objective: ? ?Vital signs in last 24 hours: ? ?Blood pressure (!) 137/52, pulse 69, temperature 98.2 ?F (36.8 ?C), resp. rate 18, height $RemoveBe'5\' 2"'JWfiTqEVt$  (1.575 m), weight 120 lb 3.2 oz (54.5 kg), SpO2 95 %. ?  ? ?HEENT: No thrush or ulcers. ?Lymphatics: No palpable cervical, supraclavicular, axillary or inguinal lymph nodes. ?Resp: Lungs clear bilaterally. ?Cardio: Regular rate and rhythm. ?GI: No hepatosplenomegaly. ?Vascular: No leg edema. ?Skin: Palms with hyperpigmentation, no skin breakdown. ? ? ?Lab Results: ? ?Lab Results  ?Component Value Date  ? WBC 6.4 03/14/2022  ? HGB 12.4 03/14/2022  ? HCT 36.9 03/14/2022  ? MCV 107.3 (H) 03/14/2022  ? PLT 217 03/14/2022  ? NEUTROABS 4.5 03/14/2022  ? ? ?Imaging: ? ?No results found. ? ?Medications: I have reviewed the patient's current medications. ? 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 ?Tumor invades through the serosa, 3/25 lymph nodes, MSS, no loss of mismatch repair protein expression ?CTs 06/26/2021-eccentric wall thickening of the hepatic flexure, tiny pulmonary nodules measuring up to 2 mm-likely infectious or inflammatory ?Elevated preoperative CEA ?Cycle 1 Xeloda 09/10/2021 ?Cycle 2 Xeloda 10/01/2021 ?Cycle 3 Xeloda 10/22/2021 ?Cycle 4 Xeloda 11/12/2021-last dose 11/16/2021, hospitalized in [place] 11/18/2021 with small bowel obstruction ?Cycle 5 Xeloda 12/17/2021 ?Cycle 6 Xeloda 01/07/2022 ?Cycle 7 Xeloda 01/28/2022 ?Cycle 8 Xeloda 02/18/2022 ?History of colon polyps including on the right colectomy specimen 08/15/2021 ?Bipolar disease ?Atrial fibrillation  maintained on anticoagulation ?Subcutaneous lesion right lower chest inferior to the breast-possibly a lipoma ?Admission to the hospital in [place] 11/18/2021 with nausea/vomiting/diarrhea.  Diagnosed with small bowel obstruction.  Discharged home 11/25/2021. ?  ? ?Disposition: Kaitlyn Hoover has completed the course of adjuvant Xeloda.  CEA has been mildly elevated.  We are referring her for CT scans.  She will return for a follow-up visit in approximately 2 weeks to review results. ? ? ? ?Ned Card ANP/GNP-BC  ? ?03/14/2022  ?1:42 PM ? ? ? ? ? ? ? ?

## 2022-03-14 NOTE — Telephone Encounter (Signed)
Patient is schedule for her CT scan at [place] at 1600 arrived at 1545. NPO 4, first contrast 2 hours, and the second contrast one before the appointment. Patient gave verbal understanding. ?

## 2022-03-19 ENCOUNTER — Ambulatory Visit (HOSPITAL_BASED_OUTPATIENT_CLINIC_OR_DEPARTMENT_OTHER)
Admission: RE | Admit: 2022-03-19 | Discharge: 2022-03-19 | Disposition: A | Discharge status: Home / Self Care | Payer: PPO | Source: Ambulatory Visit | Attending: Nurse Practitioner | Admitting: Nurse Practitioner

## 2022-03-19 DIAGNOSIS — N281 Cyst of kidney, acquired: Secondary | ICD-10-CM | POA: Diagnosis not present

## 2022-03-19 DIAGNOSIS — I700 Atherosclerosis of aorta: Secondary | ICD-10-CM | POA: Diagnosis not present

## 2022-03-19 DIAGNOSIS — C184 Malignant neoplasm of transverse colon: Secondary | ICD-10-CM | POA: Diagnosis not present

## 2022-03-19 DIAGNOSIS — R918 Other nonspecific abnormal finding of lung field: Secondary | ICD-10-CM | POA: Diagnosis not present

## 2022-03-19 DIAGNOSIS — K5730 Diverticulosis of large intestine without perforation or abscess without bleeding: Secondary | ICD-10-CM | POA: Diagnosis not present

## 2022-03-19 DIAGNOSIS — C189 Malignant neoplasm of colon, unspecified: Secondary | ICD-10-CM | POA: Diagnosis not present

## 2022-03-19 MED ORDER — IOHEXOL 300 MG/ML  SOLN
100.0000 mL | Freq: Once | INTRAMUSCULAR | Status: AC | PRN
  Administered 2022-03-19: 85 mL via INTRAVENOUS

## 2022-03-29 DIAGNOSIS — F319 Bipolar disorder, unspecified: Secondary | ICD-10-CM | POA: Diagnosis not present

## 2022-03-29 DIAGNOSIS — N182 Chronic kidney disease, stage 2 (mild): Secondary | ICD-10-CM | POA: Diagnosis not present

## 2022-03-29 DIAGNOSIS — E785 Hyperlipidemia, unspecified: Secondary | ICD-10-CM | POA: Diagnosis not present

## 2022-03-29 DIAGNOSIS — E039 Hypothyroidism, unspecified: Secondary | ICD-10-CM | POA: Diagnosis not present

## 2022-03-29 DIAGNOSIS — I480 Paroxysmal atrial fibrillation: Secondary | ICD-10-CM | POA: Diagnosis not present

## 2022-03-29 DIAGNOSIS — K219 Gastro-esophageal reflux disease without esophagitis: Secondary | ICD-10-CM | POA: Diagnosis not present

## 2022-04-03 ENCOUNTER — Inpatient Hospital Stay: Payer: PPO | Attending: Nurse Practitioner | Admitting: Oncology

## 2022-04-03 VITALS — BP 135/58 | HR 90 | Temp 97.8°F | Resp 20 | Ht 62.0 in | Wt 116.6 lb

## 2022-04-03 DIAGNOSIS — F319 Bipolar disorder, unspecified: Secondary | ICD-10-CM | POA: Diagnosis not present

## 2022-04-03 DIAGNOSIS — Z8719 Personal history of other diseases of the digestive system: Secondary | ICD-10-CM | POA: Insufficient documentation

## 2022-04-03 DIAGNOSIS — R918 Other nonspecific abnormal finding of lung field: Secondary | ICD-10-CM | POA: Diagnosis not present

## 2022-04-03 DIAGNOSIS — I4891 Unspecified atrial fibrillation: Secondary | ICD-10-CM | POA: Insufficient documentation

## 2022-04-03 DIAGNOSIS — C183 Malignant neoplasm of hepatic flexure: Secondary | ICD-10-CM | POA: Insufficient documentation

## 2022-04-03 DIAGNOSIS — Z7901 Long term (current) use of anticoagulants: Secondary | ICD-10-CM | POA: Diagnosis not present

## 2022-04-03 DIAGNOSIS — C184 Malignant neoplasm of transverse colon: Secondary | ICD-10-CM | POA: Diagnosis not present

## 2022-04-03 NOTE — Progress Notes (Signed)
?  [place] ?OFFICE PROGRESS NOTE ? ? ?Diagnosis: Colon cancer ? ?INTERVAL HISTORY:  ? ?Kaitlyn Hoover returns as scheduled.  She feels well.  No difficulty with bowel function.  No bleeding.  She reports "food poisoning "after eating shrimp last week.  She had nausea and vomiting for 1 day. ? ?Objective: ? ?Vital signs in last 24 hours: ? ?Blood pressure (!) 135/58, pulse 90, temperature 97.8 ?F (36.6 ?C), temperature source Oral, resp. rate 20, height _0  (1.575 m), weight 116 lb 9.6 oz (52.9 kg), SpO2 99 %. ?  ? ?Lymphatics: No cervical, supraclavicular, axillary, or inguinal nodes ?Resp: Lungs clear bilaterally ?Cardio: Regular rate and rhythm ?GI: No hepatosplenomegaly, no mass, nontender ?Vascular: No leg edema  ?Skin: Palms without erythema ? ? ?Lab Results: ? ?Lab Results  ?Component Value Date  ? WBC 6.4 03/14/2022  ? HGB 12.4 03/14/2022  ? HCT 36.9 03/14/2022  ? MCV 107.3 (H) 03/14/2022  ? PLT 217 03/14/2022  ? NEUTROABS 4.5 03/14/2022  ? ? ?CMP  ?Lab Results  ?Component Value Date  ? NA 141 03/14/2022  ? K 4.4 03/14/2022  ? CL 107 03/14/2022  ? CO2 26 03/14/2022  ? GLUCOSE 108 (H) 03/14/2022  ? BUN 23 03/14/2022  ? CREATININE 1.21 (H) 03/14/2022  ? CALCIUM 9.1 03/14/2022  ? PROT 7.1 03/14/2022  ? ALBUMIN 4.2 03/14/2022  ? AST 23 03/14/2022  ? ALT 10 03/14/2022  ? ALKPHOS 72 03/14/2022  ? BILITOT 0.9 03/14/2022  ? GFRNONAA 45 (L) 03/14/2022  ? GFRAA 53 (L) 11/02/2020  ? ? ?Lab Results  ?Component Value Date  ? CEA1 5.4 (H) 08/06/2021  ? CEA 11.76 (H) 03/14/2022  ? ? ?Medications: I have reviewed the patient's current medications. ? ? 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 ?Tumor invades through the serosa, 3/25 lymph nodes, MSS, no loss of mismatch repair protein expression ?CTs 06/26/2021-eccentric wall thickening of the hepatic flexure, tiny pulmonary nodules measuring up to 2 mm-likely infectious or  inflammatory ?Elevated preoperative CEA ?Cycle 1 Xeloda 09/10/2021 ?Cycle 2 Xeloda 10/01/2021 ?Cycle 3 Xeloda 10/22/2021 ?Cycle 4 Xeloda 11/12/2021-last dose 11/16/2021, hospitalized in [place] 11/18/2021 with small bowel obstruction ?Cycle 5 Xeloda 12/17/2021 ?Cycle 6 Xeloda 01/07/2022 ?Cycle 7 Xeloda 01/28/2022 ?Cycle 8 Xeloda 02/18/2022 ?Mild elevation of the CEA ?CTs 03/19/2022-no evidence of metastatic disease, unchanged tiny pulmonary nodules-likely benign ?History of colon polyps including on the right colectomy specimen 08/15/2021 ?Bipolar disease ?Atrial fibrillation maintained on anticoagulation ?Subcutaneous lesion right lower chest inferior to the breast-possibly a lipoma ?Admission to the hospital in [place] 11/18/2021 with nausea/vomiting/diarrhea.  Diagnosed with small bowel obstruction.  Discharged home 11/25/2021. ?  ? ? ?Disposition: ?Kaitlyn Hoover is in clinical remission from colon cancer.  The surveillance CT showed no evidence of recurrent disease.  She has persistent mild elevation of the CEA.  This is likely a benign finding.  We will submit a peripheral blood sample for circulating tumor DNA testing if the CEA rises.  She will return for an office visit and CEA in 3 months. ? ?[name], MD ? ?04/03/2022  ?9:52 AM ? ? ?

## 2022-04-23 ENCOUNTER — Other Ambulatory Visit (HOSPITAL_COMMUNITY): Payer: Self-pay

## 2022-06-18 ENCOUNTER — Encounter: Payer: Self-pay | Admitting: Psychiatry

## 2022-06-18 ENCOUNTER — Ambulatory Visit (INDEPENDENT_AMBULATORY_CARE_PROVIDER_SITE_OTHER): Payer: PPO | Admitting: Psychiatry

## 2022-06-18 DIAGNOSIS — G3184 Mild cognitive impairment, so stated: Secondary | ICD-10-CM | POA: Diagnosis not present

## 2022-06-18 DIAGNOSIS — F5105 Insomnia due to other mental disorder: Secondary | ICD-10-CM | POA: Diagnosis not present

## 2022-06-18 DIAGNOSIS — F99 Mental disorder, not otherwise specified: Secondary | ICD-10-CM

## 2022-06-18 DIAGNOSIS — F250 Schizoaffective disorder, bipolar type: Secondary | ICD-10-CM

## 2022-06-18 NOTE — Progress Notes (Signed)
Kaitlyn Hoover [id number] 07/30/1941 81 y.o.     Subjective:   Patient ID:  Kaitlyn Hoover is a 81 y.o. (DOB 11-01-1941) female.  Chief Complaint:  Chief Complaint  Patient presents with   Follow-up    Schizoaffective disorder, bipolar type ([place])   Sleeping Problem    Anxiety Patient reports no confusion, decreased concentration, dizziness, nervous/anxious behavior, palpitations or suicidal ideas.    Depression        Associated symptoms include fatigue.  Associated symptoms include no decreased concentration and no suicidal ideas.  Past medical history includes anxiety.    Kaitlyn Hoover presents for follow-up of psych issures  seen November 2020  and no meds were changed.  She called in August 2020  wanting to increase donepezil from 5 to 10 mg daily.  That was done.  03/28/20 appt with the following noted: Deep depression March.  No pcpt other than being confined.  No seasonal changes.  No real problems with depression in years.  Did worry about it.  Even had SI without plan.  Then got better.  Totally better now spontaneously.  She wonders about reducing ziprasidone. Doing OK overall.  Bored.  Not depressed.  Sleeping well with temazepam 30 mg with 8-9 hours.  Doing Zoom with church and looks forward to it.  Otherwise watching TV. Tolerated Aricept and has seen benefit for recall.  No paranoia or fear.   No med changes  06/26/20 TC stating: She's referring to the Temazepam 30 mg, she stopped on Thursday and hasn't slept in  4 days. She asked if eventually she would start sleeping without? I discussed that you would might recommend she go down to 15 mg, she really doesn't want to take it at all but will do as instructed. She does really she has to get some sleep. She's tried Benadryl but not helped either.  Following info was given to her: She has tapered off temazepam before but had to restart it.  She is tried alternatives clonazepam, Tranxene, trazodone, hydroxyzine.   She prefers to avoid meds that could have weight gain.  She could probably stop temazepam more quickly and easily and still sleep if she took half of the 25 mg quetiapine but it may still give her a hangover. The way we tapered her off temazepam the last time was in the traditional manner of reducing the dose.  She still will have some insomnia but it likely is to be more bearable.  I would suggest we send in a prescription for 15 mg capsules and she take that for 2 to 4 weeks and she can try stopping again if she wants to at that point. If she stops temazepam cold [place] as she is doing currently she is likely to have pretty serious insomnia and very fragmented sleep that will not respond to Benadryl for a period of 2 to 4 weeks.  She has chronic insomnia which is the reason she is taken the temazepam and I am not sure what her sleep will be like once she gets through the withdrawal phase of stopping the temazepam. Pt via TC stated: Rtc to [facility] and she would prefer the 15 mg capsule of Temazepam, take 1 at hs. Rx called into her [facility] [address] Discussed her chronic insomnia and she verbalized understanding. Advised to call back with worsening condition or not effective for sleep.   07/05/20 appt with the following noted: Hell trying to stop temazepam 30 mg cold [place].  Couldn't sleep for days. Sleep is fine on 15 mg HS however. No interest, not going to church since Pandemic.  Not worrying much about Covid.  Got vaccine.  Can't motivate herself to go get clothes or yard work.  Just sits and watches TV or reads.  Only social with friends once monthly.  Don't understand while not going to church.  Unmotivated and some sadness. No sig worry or fear other than about her mental health and no paranoia. Doctor said iron was low.  Recent UTI and ABx helped energy.  Donepezil has really helped with her memory she is well pleased.  Patient denies any suicidal ideation. Plan: Added lamotrigine for  depression  09/07/20 appt with following noted: More motivated and active and productive and less napping.  Big difference with addtion of lamotrigine.  Got benefit by 50 mg daily. NO SE. Depression resolved "definitely" No paranoia.  Nor fearfulness nor mood swings. Geodon and Fanapt doses are perfect.  Mild mouth movements but not severe. Sleep well with temazepam. Plan: no med changes  03/08/21 appt noted: Just not having any trouble.  Got me on the right medication.  Occ a little down but it's ok. No SE with lamotrigine. Had a fall and hurt back.  Not much better and had her down some.  Don't get out much. 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  Not going to church bc it's hard. Sleep OK No paranoia.  09/06/21 appt noted: Colon CA dx and surg 3 weeks ago. 3 positive nodes and started chemo soon. Been awful.  Depressed  since surgery.  Not really bad.  Feels drugged in AM until about 1 PM No change in meds.  Sleep 10 -8.    No insomnia.   Worried over chemo and how it will affect her.  Not worried over cancer. No paranoia since here. Plan: Yes reduce temazepam to 7.5 mg HS Continue ziprasidone 40, Fanapt 4 mg 1/4 tablet daily, lamotrigine 100  12/18/21 appt noted: Doing OK. Sleeping fine with temazepam 7.5 mg HS No mood problems or swings.  No fear or anxiety.  No paranoia. Satisfied with meds.  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 Otherwise satisfied with activity.  06/18/22 appt noted: Psych meds: donepezil 10, lamotrigine 100, Fanapt 1 mg and Geodon 40 mg daily without changes. Fine without complaints.  Is tired a lot.  No napping but does lay down. Taking one capsule nightly of temazepam and she thinks it's 15 mg but computer shows 7.5 mg HS . Sleep 9 hours. No paranoia.  Not sig depression but does lay down a couple of times daily.  Not sad.  Not a lot of interest.  Is bored.  Occ finds Netflix interests.  Will procrastinate things.   Health has been  stable. Cancer free (hx colon CA) Finished chemo.   Pleased with meds and no new SE concerns.  No clenching any more with Fanapt added to Geodon.  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  Past Psychiatric Medication Trials: Olanzapine haloperidol, Abilify, Latuda, Saphris, Geodon 60 , Fanapt 2 mg,  Depakote 250 mg 3 times d aily, benztropine, Artane,  clonazepam side effects, Tranxene increased appetite, temazepam,  trazodone, hydroxyzine She has been under the care of this office since 2003.  Review of Systems:  Review of Systems  Constitutional:  Positive for fatigue.  HENT:         Jaw clenching stable.  Cardiovascular:  Negative for palpitations.  Musculoskeletal:  Positive for back pain and gait problem.  Neurological:  Positive for facial asymmetry and weakness. Negative for dizziness and tremors.  Psychiatric/Behavioral:  Positive for depression. Negative for agitation, behavioral problems, confusion, decreased concentration, dysphoric mood, hallucinations, self-injury, sleep disturbance and suicidal ideas. The patient is not nervous/anxious and is not hyperactive.     Medications: I have reviewed the patient's current medications.  Current Outpatient Medications  Medication Sig Dispense Refill   acyclovir (ZOVIRAX) 400 MG tablet Take 400 mg at bedtime by mouth.      alendronate (FOSAMAX) 70 MG tablet Take 70 mg by mouth every Sunday. Take with a full glass of water on an empty stomach.     apixaban (ELIQUIS) 2.5 MG TABS tablet Take 1 tablet (2.5 mg total) by mouth 2 (two) times daily. 60 tablet 5   Ascorbic Acid (VITAMIN C) 1000 MG tablet Take 1,000 mg by mouth daily.     azelastine (OPTIVAR) 0.05 % ophthalmic solution Place 2 drops into both eyes daily as needed. Inching eyes     b complex vitamins capsule Take 1 capsule by mouth daily.     Calcium Citrate-Vitamin D (CALCIUM CITRATE + D PO) Take 1,200 mg by mouth at bedtime.     cholecalciferol (VITAMIN D) 25 MCG (1000  UNIT) tablet Take 1,000 Units by mouth daily.     Coenzyme Q10 (CO Q-10) 100 MG CAPS Take 100 mg by mouth daily.      D-Ribose (RIBOSE, D,) POWD Take 2 g by mouth daily.      diltiazem (CARDIZEM CD) 180 MG 24 hr capsule Take 180 mg by mouth daily.     donepezil (ARICEPT) 10 MG tablet Take 1 tablet (10 mg total) by mouth at bedtime. 90 tablet 3   ferrous sulfate 324 MG TBEC Take 324 mg by mouth daily with breakfast.     fluticasone (FLONASE) 50 MCG/ACT nasal spray Place 2 sprays into both nostrils daily.     GAVILAX 17 GM/SCOOP powder Take 17 g by mouth daily. Miralax     Ginkgo Biloba 120 MG TABS Take 240 mg by mouth daily.     iloperidone (FANAPT) 4 MG TABS tablet Take 1 tablet (4 mg total) by mouth at bedtime. (Patient taking differently: Take 4 mg by mouth at bedtime. Takes 0.25 tablet every night) 30 tablet 3   lamoTRIgine (LAMICTAL) 100 MG tablet Take 1 tablet (100 mg total) by mouth daily. 90 tablet 1   levothyroxine (SYNTHROID, LEVOTHROID) 100 MCG tablet Take 100 mcg by mouth daily before breakfast.      MAGNESIUM PO Take 250 mg by mouth every evening.     Melatonin 3 MG TABS Take 3 mg by mouth at bedtime.     Omega-3 Fatty Acids (FISH OIL BURP-LESS) 1200 MG CAPS Take 1,200 mg by mouth daily.     oxybutynin (DITROPAN XL) 15 MG 24 hr tablet Take 15 mg by mouth daily.      rosuvastatin (CRESTOR) 10 MG tablet TAKE 1 TABLET BY MOUTH UP TO 4 TIMES A WEEK ON SUNDAY, MONDAY,WEDNESDAY AND FRIDAY 45 tablet 3   sodium chloride (OCEAN) 0.65 % SOLN nasal spray Place 2 sprays into both nostrils every evening.     temazepam (RESTORIL) 7.5 MG capsule TAKE 1 CAPSULE (7.5 MG TOTAL) BY MOUTH AT BEDTIME. (Patient taking differently: Take 7.5 mg by mouth at bedtime. 15 mg HS) 30 capsule 5   Turmeric 500 MG CAPS Take 1,000 mg by mouth daily.  vitamin B-12 (CYANOCOBALAMIN) 1000 MCG tablet Take 1,000 mcg by mouth daily.     ziprasidone (GEODON) 40 MG capsule Take 1 capsule (40 mg total) by mouth at bedtime.  90 capsule 1   capecitabine (XELODA) 500 MG tablet Take #3 tablets (1500 mg) every am and #2 tablets (1000 mg) every evening x 14 days on, then 7 day rest. Repeat every 21 days. Start this cycle on 02/18/2022 (Patient not taking: Reported on 03/14/2022) 70 tablet 0   ondansetron (ZOFRAN) 8 MG tablet Take 1 tablet (8 mg total) by mouth every 8 (eight) hours as needed for nausea or vomiting. (Patient not taking: Reported on 04/03/2022) 30 tablet 0   traMADol (ULTRAM) 50 MG tablet Take 1 tablet (50 mg total) by mouth 3 (three) times daily as needed. (Patient not taking: Reported on 06/18/2022) 90 tablet 3   No current facility-administered medications for this visit.    Medication Side Effects: Other: EPS prone.  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    Family History  Problem Relation Age of Onset   Hypertension Mother    Cancer Father        JAW BONE   COPD Brother    Atrial fibrillation Brother     Social History   Socioeconomic History   Marital status: Divorced    Spouse name: Not on file   Number of children: Not on file   Years of education: Not on file   Highest education level: Not on file  Occupational History   Not on file  Tobacco Use   Smoking status: Former    Packs/day: 1.00    Years: 25.00    Total pack years: 25.00    Types: Cigarettes    Start date: 28    Quit date: 1998    Years since quitting: 25.5   Smokeless tobacco: Never   Tobacco  comments:    QUIT SMOKING IN 1998  Vaping Use   Vaping Use: Never used  Substance and Sexual Activity   Alcohol use: Not Currently   Drug use: No   Sexual activity: Not on file  Other Topics Concern   Not on file  Social History Narrative   Not on file   Social Determinants of Health   Financial Resource Strain: Not on file  Food Insecurity: Not on file  Transportation Needs: Not on file  Physical Activity: Not on file  Stress: Not on file  Social Connections: Not on file  Intimate Partner Violence: Not on file    Past Medical History, Surgical history, Social history, and Family history were reviewed and updated as appropriate.   Please see review of systems for further details on the patient's review from today.   Objective:   Physical Exam:  LMP  (LMP Unknown)   Physical Exam Constitutional:      General: She  is not in acute distress. Musculoskeletal:        General: No deformity.  Neurological:     Mental Status: She is alert and oriented to person, place, and time.     Cranial Nerves: Dysarthria present.     Coordination: Coordination normal.     Comments: Mild jaw clenching  Psychiatric:        Attention and Perception: Attention and perception normal. She does not perceive auditory or visual hallucinations.        Mood and Affect: Mood is not anxious or depressed. Affect is not labile, blunt, angry or inappropriate.        Speech: Speech normal. Speech is not slurred.        Behavior: Behavior normal. Behavior is not agitated. Behavior is cooperative.        Thought Content: Thought content normal. Thought content is not paranoid or delusional. Thought content does not include homicidal or suicidal ideation. Thought content does not include suicidal plan.        Cognition and Memory: Cognition and memory normal.        Judgment: Judgment normal.     Comments: History of paranoia in remission    history of paranoia controlled.   Lab Review:      Component Value Date/Time   NA 141 03/14/2022 1306   NA 142 11/02/2020 1146   K 4.4 03/14/2022 1306   CL 107 03/14/2022 1306   CO2 26 03/14/2022 1306   GLUCOSE 108 (H) 03/14/2022 1306   BUN 23 03/14/2022 1306   BUN 15 11/02/2020 1146   CREATININE 1.21 (H) 03/14/2022 1306   CALCIUM 9.1 03/14/2022 1306   PROT 7.1 03/14/2022 1306   PROT 7.2 02/12/2017 1013   ALBUMIN 4.2 03/14/2022 1306   ALBUMIN 4.0 02/12/2017 1013   AST 23 03/14/2022 1306   ALT 10 03/14/2022 1306   ALKPHOS 72 03/14/2022 1306   BILITOT 0.9 03/14/2022 1306   GFRNONAA 45 (L) 03/14/2022 1306   GFRAA 53 (L) 11/02/2020 1146       Component Value Date/Time   WBC 6.4 03/14/2022 1306   WBC 10.8 (H) 08/17/2021 0428   RBC 3.44 (L) 03/14/2022 1306   HGB 12.4 03/14/2022 1306   HGB 11.8 11/02/2020 1146   HCT 36.9 03/14/2022 1306   HCT 34.4 11/02/2020 1146   PLT 217 03/14/2022 1306   PLT 252 11/02/2020 1146   MCV 107.3 (H) 03/14/2022 1306   MCV 92 11/02/2020 1146   MCH 36.0 (H) 03/14/2022 1306   MCHC 33.6 03/14/2022 1306   RDW 17.0 (H) 03/14/2022 1306   RDW 12.0 11/02/2020 1146   LYMPHSABS 1.4 03/14/2022 1306   LYMPHSABS 1.8 04/11/2017 0911   MONOABS 0.5 03/14/2022 1306   EOSABS 0.1 03/14/2022 1306   EOSABS 0.2 04/11/2017 0911   BASOSABS 0.1 03/14/2022 1306   BASOSABS 0.1 04/11/2017 0911    No results found for: "POCLITH", "LITHIUM"   No results found for: "PHENYTOIN", "PHENOBARB", "VALPROATE", "CBMZ"   .res Assessment: Plan:    Schizoaffective disorder, bipolar type ([place])  Insomnia due to other mental disorder  Mild cognitive impairment   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 It does not appear severe enough to warrant additional medication.  Greater than 50% of face to face time with patient was spent on counseling and coordination of care.  K has a long history of repeated episodes of paranoia associated with hypomania at times and at times independent  of  mood changes.  WE discussed her med-sensitivity.  As noted above she has tried multiple different antipsychotics and different antipsychotic combinations.  Discussed potential metabolic side effects associated with atypical antipsychotics, as well as potential risk for movement side effects. Advised pt to contact office if movement side effects occur.  She has mild dysarthria which may be related but it is no worse and it stable.  She is very specifically aware of the risk of tardive dyskinesia.   The unusual combination of antipsychotics is helpful in that the [place] actually offsets some of the dystonia that the Geodon tends to cause.  Lower dose of antipsychotics has led to worsening paranoia.  Her paranoia is well controlled at the current combination of medications.  Depression resolved with lamotrigine for the most part until surgery.  Defer increase. But will do so next time if still depressed.  Supportive therapy over dealing with cancer and mental health piece of it. She's doing well now.  We discussed the short-term risks associated with benzodiazepines including sedation and increased fall risk among others.  Discussed long-term side effect risk including dependence, potential withdrawal symptoms, and the potential eventual dose-related risk of dementia.  But recent studies from 2020 dispute this association between benzodiazepines and dementia risk. Newer studies in 2020 do not support an association with dementia.  We have tried to taper her off benzodiazepines for sleep and she was able to be off for a short period of time but then had recurrence of insomnia.  She is aware of the risks but the benefit for sleep is noteworthy and she is failed multiple other sleep meds.   Discussed cost concerns with insurance currently not wanting to cover temazepam. Ok with reduced temazepam to 7.5 mg HS She thinks she is taking 15 mg temazepam but PDMP says the dose has been 7.5 since October of last  year.  Asked her to check the bottle when she gets home and call back.  Continue ziprasidone 40, Fanapt 4 mg 1/4 tablet daily,  lamotrigine 100 Aricept 10 helped memory.  No SE. Still notices benefit. No med changes.  She has had worsening paranoia at lower doses of ziprasidone in the past.  No problems at 40 mg daily  Follow-up 6 months  Lynder Parents MD, DFAPA   Please see After Visit Summary for patient specific instructions.  Future Appointments  Date Time Provider [place]  06/25/2022  1:30 PM [facility] DX DEXA 1 [facility]  07/04/2022  1:00 PM [facility] PHLEBOTOMIST [facility] None  07/04/2022  1:15 PM Owens Shark, NP [facility] None  11/08/2022  8:45 AM Camnitz, Ocie Doyne, MD [facility]    No orders of the defined types were placed in this encounter.      -------------------------------

## 2022-06-19 ENCOUNTER — Other Ambulatory Visit: Payer: Self-pay | Admitting: Psychiatry

## 2022-06-19 ENCOUNTER — Telehealth: Payer: Self-pay | Admitting: Psychiatry

## 2022-06-19 DIAGNOSIS — F99 Mental disorder, not otherwise specified: Secondary | ICD-10-CM

## 2022-06-19 MED ORDER — TEMAZEPAM 7.5 MG PO CAPS: 7.5000 mg | ORAL_CAPSULE | Freq: Every day | ORAL | 5 refills | Status: DC

## 2022-06-19 NOTE — Telephone Encounter (Signed)
Patient is on 7.5 mg temazepam.

## 2022-06-19 NOTE — Telephone Encounter (Signed)
Noted. Thanks.

## 2022-06-19 NOTE — Telephone Encounter (Addendum)
Pt called reporting her Temazepam bottle is 7.5 mg. She is very sorry she thought it was 15 mg.  Please advise Dr. Clovis Pu

## 2022-06-25 ENCOUNTER — Ambulatory Visit
Admission: RE | Admit: 2022-06-25 | Discharge: 2022-06-25 | Disposition: A | Discharge status: Home / Self Care | Payer: PPO | Source: Ambulatory Visit | Attending: Family Medicine | Admitting: Family Medicine

## 2022-06-25 DIAGNOSIS — M85832 Other specified disorders of bone density and structure, left forearm: Secondary | ICD-10-CM | POA: Diagnosis not present

## 2022-06-25 DIAGNOSIS — M810 Age-related osteoporosis without current pathological fracture: Secondary | ICD-10-CM | POA: Diagnosis not present

## 2022-06-25 DIAGNOSIS — M8580 Other specified disorders of bone density and structure, unspecified site: Secondary | ICD-10-CM

## 2022-06-25 DIAGNOSIS — Z780 Asymptomatic menopausal state: Secondary | ICD-10-CM | POA: Diagnosis not present

## 2022-07-03 ENCOUNTER — Other Ambulatory Visit: Payer: Self-pay | Admitting: Psychiatry

## 2022-07-03 DIAGNOSIS — F5105 Insomnia due to other mental disorder: Secondary | ICD-10-CM

## 2022-07-04 ENCOUNTER — Inpatient Hospital Stay: Payer: PPO | Attending: Oncology

## 2022-07-04 ENCOUNTER — Encounter: Payer: Self-pay | Admitting: Nurse Practitioner

## 2022-07-04 ENCOUNTER — Inpatient Hospital Stay (HOSPITAL_BASED_OUTPATIENT_CLINIC_OR_DEPARTMENT_OTHER): Payer: PPO | Admitting: Nurse Practitioner

## 2022-07-04 VITALS — BP 150/58 | HR 64 | Temp 98.1°F | Resp 18 | Ht 62.0 in | Wt 117.8 lb

## 2022-07-04 DIAGNOSIS — F319 Bipolar disorder, unspecified: Secondary | ICD-10-CM | POA: Diagnosis not present

## 2022-07-04 DIAGNOSIS — Z7901 Long term (current) use of anticoagulants: Secondary | ICD-10-CM | POA: Insufficient documentation

## 2022-07-04 DIAGNOSIS — K56609 Unspecified intestinal obstruction, unspecified as to partial versus complete obstruction: Secondary | ICD-10-CM | POA: Insufficient documentation

## 2022-07-04 DIAGNOSIS — I4891 Unspecified atrial fibrillation: Secondary | ICD-10-CM | POA: Diagnosis not present

## 2022-07-04 DIAGNOSIS — C183 Malignant neoplasm of hepatic flexure: Secondary | ICD-10-CM | POA: Diagnosis not present

## 2022-07-04 DIAGNOSIS — R918 Other nonspecific abnormal finding of lung field: Secondary | ICD-10-CM | POA: Diagnosis not present

## 2022-07-04 DIAGNOSIS — C184 Malignant neoplasm of transverse colon: Secondary | ICD-10-CM

## 2022-07-04 LAB — CEA (ACCESS): CEA (CHCC): 6.61 ng/mL — ABNORMAL HIGH (ref 0.00–5.00)

## 2022-07-04 NOTE — Progress Notes (Signed)
  Kaitlyn Hoover OFFICE PROGRESS NOTE   Diagnosis: Colon cancer  INTERVAL HISTORY:   Kaitlyn Hoover returns as scheduled.  She feels well.  She is having regular bowel movements.  No bleeding or pain with bowel movements.  She denies abdominal pain.  She has a good appetite.  Energy level described as "so-so".  She has an appointment with gastroenterology toward the end of this month to discuss a colonoscopy.  Objective:  Vital signs in last 24 hours:  Blood pressure (!) 150/58, pulse 64, temperature 98.1 F (36.7 C), temperature source Oral, resp. rate 18, height _0  (1.575 m), weight 117 lb 12.8 oz (53.4 kg), SpO2 100 %.    Lymphatics: No palpable cervical, supraclavicular, axillary or inguinal lymph nodes. Resp: Lungs clear bilaterally. Cardio: Rate and rhythm. GI: Abdomen soft and nontender.  No hepatosplenomegaly. Vascular: No leg edema.  Lab Results:  Lab Results  Component Value Date   WBC 6.4 03/14/2022   HGB 12.4 03/14/2022   HCT 36.9 03/14/2022   MCV 107.3 (H) 03/14/2022   PLT 217 03/14/2022   NEUTROABS 4.5 03/14/2022    Imaging:  No results found.  Medications: I have reviewed the patient's current medications.  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 Tumor invades through the serosa, 3/25 lymph nodes, MSS, no loss of mismatch repair protein expression CTs 06/26/2021-eccentric wall thickening of the hepatic flexure, tiny pulmonary nodules measuring up to 2 mm-likely infectious or inflammatory Elevated preoperative CEA Cycle 1 Xeloda 09/10/2021 Cycle 2 Xeloda 10/01/2021 Cycle 3 Xeloda 10/22/2021 Cycle 4 Xeloda 11/12/2021-last dose 11/16/2021, hospitalized in [place] 11/18/2021 with small bowel obstruction Cycle 5 Xeloda 12/17/2021 Cycle 6 Xeloda 01/07/2022 Cycle 7 Xeloda 01/28/2022 Cycle 8 Xeloda 02/18/2022 Mild elevation of the CEA CTs 03/19/2022-no evidence of metastatic disease,  unchanged tiny pulmonary nodules-likely benign History of colon polyps including on the right colectomy specimen 08/15/2021 Bipolar disease Atrial fibrillation maintained on anticoagulation Subcutaneous lesion right lower chest inferior to the breast-possibly a lipoma Admission to the hospital in [place] 11/18/2021 with nausea/vomiting/diarrhea.  Diagnosed with small bowel obstruction.  Discharged home 11/25/2021.  Disposition: Kaitlyn Hoover remains in clinical remission from colon cancer.  We will follow-up on the CEA from today.  She has follow-up with gastroenterology later this month to discuss timing of her colonoscopy.  She will return for a CEA and follow-up visit in 3 months.    Ned Card ANP/GNP-BC   07/04/2022  2:02 PM

## 2022-07-04 NOTE — Telephone Encounter (Signed)
Due 8/19

## 2022-07-05 ENCOUNTER — Telehealth: Payer: Self-pay

## 2022-07-05 NOTE — Telephone Encounter (Signed)
Patient verbal understanding and had no further questions or concerns. 

## 2022-07-05 NOTE — Telephone Encounter (Signed)
-----   Message from Owens Shark, NP sent at 07/05/2022  1:29 PM EDT ----- Please let her know the CEA is mildly elevated, lower than when we checked a few months ago.  Follow-up as scheduled.

## 2022-07-12 ENCOUNTER — Telehealth: Payer: Self-pay | Admitting: *Deleted

## 2022-07-12 NOTE — Telephone Encounter (Signed)
   Pre-operative Risk Assessment    Patient Name: Kaitlyn Hoover  DOB: [birth year] MRN: [id number]      Request for Surgical Clearance    Procedure:   COLONOSCOPY : H/O COLON CANCER  Date of Surgery:  Clearance 09/03/22                                 Surgeon:  DR. Cristina Gong Surgeon's Group or Practice Name: [facility] Phone number:  [phone] Fax number:  [phone]   Type of Clearance Requested:   - Medical  - Pharmacy:  Hold Apixaban (Eliquis)     Type of Anesthesia:  Not Indicated (PROPOFOL?)   Additional requests/questions:    [name]   07/12/2022, 1:53 PM

## 2022-07-15 NOTE — Telephone Encounter (Signed)
Patient with diagnosis of atrial fibrillation on Eliquis for anticoagulation.    Procedure: colonoscopy Date of procedure: 09/03/22   CHA2DS2-VASc Score = 5   This indicates a 7.2% annual risk of stroke. The patient's score is based upon: CHF History: 0 HTN History: 1 Diabetes History: 0 Stroke History: 0 Vascular Disease History: 1 Age Score: 2 Gender Score: 1   CrCl 31 Platelet count 217  Per office protocol, patient can hold Eliquis for 2 days prior to procedure.   Patient will not need bridging with Lovenox (enoxaparin) around procedure.  **This guidance is not considered finalized until pre-operative APP has relayed final recommendations.**

## 2022-07-16 ENCOUNTER — Telehealth: Payer: Self-pay | Admitting: *Deleted

## 2022-07-16 NOTE — Telephone Encounter (Signed)
   Name: Kaitlyn Hoover  DOB: 21-Jan-1941  MRN: [id number]  Primary Cardiologist: None   Preoperative team, please contact this patient and set up a phone call appointment for further preoperative risk assessment. Please obtain consent and complete medication review. Thank you for your help.  I confirm that guidance regarding antiplatelet and oral anticoagulation therapy has been completed and, if necessary, noted below.   Richardson Dopp, PA-C 07/16/2022, 8:02 AM [place]

## 2022-07-16 NOTE — Telephone Encounter (Signed)
Left message for the pt to call back for tele pre op appt.  ?

## 2022-07-16 NOTE — Telephone Encounter (Signed)
Pt agreeable to plan of care for tele pre op appt 08/12/22 @ 3 pm. Med rec and consent are done.     Patient Consent for Virtual Visit        Kaitlyn Hoover has provided verbal consent on 07/16/2022 for a virtual visit (video or telephone).   CONSENT FOR VIRTUAL VISIT FOR:  Kaitlyn Hoover  By participating in this virtual visit I agree to the following:  I hereby voluntarily request, consent and authorize [facility] and its employed or contracted physicians, physician assistants, nurse practitioners or other licensed health care professionals (the Practitioner), to provide me with telemedicine health care services (the "Services") as deemed necessary by the treating Practitioner. I acknowledge and consent to receive the Services by the Practitioner via telemedicine. I understand that the telemedicine visit will involve communicating with the Practitioner through live audiovisual communication technology and the disclosure of certain medical information by electronic transmission. I acknowledge that I have been given the opportunity to request an in-person assessment or other available alternative prior to the telemedicine visit and am voluntarily participating in the telemedicine visit.  I understand that I have the right to withhold or withdraw my consent to the use of telemedicine in the course of my care at any time, without affecting my right to future care or treatment, and that the Practitioner or I may terminate the telemedicine visit at any time. I understand that I have the right to inspect all information obtained and/or recorded in the course of the telemedicine visit and may receive copies of available information for a reasonable fee.  I understand that some of the potential risks of receiving the Services via telemedicine include:  Delay or interruption in medical evaluation due to technological equipment failure or disruption; Information transmitted may not be sufficient  (e.g. poor resolution of images) to allow for appropriate medical decision making by the Practitioner; and/or  In rare instances, security protocols could fail, causing a breach of personal health information.  Furthermore, I acknowledge that it is my responsibility to provide information about my medical history, conditions and care that is complete and accurate to the best of my ability. I acknowledge that Practitioner's advice, recommendations, and/or decision may be based on factors not within their control, such as incomplete or inaccurate data provided by me or distortions of diagnostic images or specimens that may result from electronic transmissions. I understand that the practice of medicine is not an exact science and that Practitioner makes no warranties or guarantees regarding treatment outcomes. I acknowledge that a copy of this consent can be made available to me via my patient portal ([facility]), or I can request a printed copy by calling the office of [facility].    I understand that my insurance will be billed for this visit.   I have read or had this consent read to me. I understand the contents of this consent, which adequately explains the benefits and risks of the Services being provided via telemedicine.  I have been provided ample opportunity to ask questions regarding this consent and the Services and have had my questions answered to my satisfaction. I give my informed consent for the services to be provided through the use of telemedicine in my medical care

## 2022-07-16 NOTE — Telephone Encounter (Signed)
Follow Up:      Patient called back to make Pre-Op appointment.

## 2022-07-16 NOTE — Telephone Encounter (Signed)
Pt agreeable to plan of care for tele pre op appt 08/12/22 @ 3 pm. Med rec and consent are done.

## 2022-07-25 DIAGNOSIS — D485 Neoplasm of uncertain behavior of skin: Secondary | ICD-10-CM | POA: Diagnosis not present

## 2022-07-25 DIAGNOSIS — D2271 Melanocytic nevi of right lower limb, including hip: Secondary | ICD-10-CM | POA: Diagnosis not present

## 2022-07-25 DIAGNOSIS — Z1283 Encounter for screening for malignant neoplasm of skin: Secondary | ICD-10-CM | POA: Diagnosis not present

## 2022-07-25 DIAGNOSIS — D225 Melanocytic nevi of trunk: Secondary | ICD-10-CM | POA: Diagnosis not present

## 2022-07-25 DIAGNOSIS — D2261 Melanocytic nevi of right upper limb, including shoulder: Secondary | ICD-10-CM | POA: Diagnosis not present

## 2022-08-06 DIAGNOSIS — M62830 Muscle spasm of back: Secondary | ICD-10-CM | POA: Diagnosis not present

## 2022-08-06 DIAGNOSIS — M5416 Radiculopathy, lumbar region: Secondary | ICD-10-CM | POA: Diagnosis not present

## 2022-08-06 DIAGNOSIS — S336XXA Sprain of sacroiliac joint, initial encounter: Secondary | ICD-10-CM | POA: Diagnosis not present

## 2022-08-06 DIAGNOSIS — M9903 Segmental and somatic dysfunction of lumbar region: Secondary | ICD-10-CM | POA: Diagnosis not present

## 2022-08-07 DIAGNOSIS — M5416 Radiculopathy, lumbar region: Secondary | ICD-10-CM | POA: Diagnosis not present

## 2022-08-07 DIAGNOSIS — M62830 Muscle spasm of back: Secondary | ICD-10-CM | POA: Diagnosis not present

## 2022-08-07 DIAGNOSIS — S336XXA Sprain of sacroiliac joint, initial encounter: Secondary | ICD-10-CM | POA: Diagnosis not present

## 2022-08-07 DIAGNOSIS — M9903 Segmental and somatic dysfunction of lumbar region: Secondary | ICD-10-CM | POA: Diagnosis not present

## 2022-08-09 DIAGNOSIS — M47816 Spondylosis without myelopathy or radiculopathy, lumbar region: Secondary | ICD-10-CM | POA: Diagnosis not present

## 2022-08-09 DIAGNOSIS — Z981 Arthrodesis status: Secondary | ICD-10-CM | POA: Diagnosis not present

## 2022-08-12 ENCOUNTER — Ambulatory Visit: Payer: PPO | Attending: Internal Medicine | Admitting: Nurse Practitioner

## 2022-08-12 DIAGNOSIS — M9903 Segmental and somatic dysfunction of lumbar region: Secondary | ICD-10-CM | POA: Diagnosis not present

## 2022-08-12 DIAGNOSIS — Z01810 Encounter for preprocedural cardiovascular examination: Secondary | ICD-10-CM | POA: Diagnosis not present

## 2022-08-12 DIAGNOSIS — M62830 Muscle spasm of back: Secondary | ICD-10-CM | POA: Diagnosis not present

## 2022-08-12 DIAGNOSIS — M5416 Radiculopathy, lumbar region: Secondary | ICD-10-CM | POA: Diagnosis not present

## 2022-08-12 DIAGNOSIS — S336XXA Sprain of sacroiliac joint, initial encounter: Secondary | ICD-10-CM | POA: Diagnosis not present

## 2022-08-12 NOTE — Progress Notes (Signed)
Virtual Visit via Telephone Note   Because of Kaitlyn Hoover's co-morbid illnesses, she is at least at moderate risk for complications without adequate follow up.  This format is felt to be most appropriate for this patient at this time.  The patient did not have access to video technology/had technical difficulties with video requiring transitioning to audio format only (telephone).  All issues noted in this document were discussed and addressed.  No physical exam could be performed with this format.  Please refer to the patient's chart for her consent to telehealth for [facility].  Evaluation Performed:  Preoperative cardiovascular risk assessment _____________   Date:  08/12/2022   Patient ID:  Kaitlyn Hoover, DOB [birth year], MRN [id number] Patient Location:  Home Provider location:   Office  Primary Care Provider:  London Pepper, MD Primary Cardiologist:  None  Chief Complaint / Patient Profile   81 y.o. y/o female with a h/o CAD, paroxysmal atrial fibrillation s/p ablation in 2018, AVNRT, PACs, hypertension, and hyperlipidemia who is pending colonoscopy on 09/03/2022 with Dr. Cristina Gong of [facility] and presents today for telephonic preoperative cardiovascular risk assessment.  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   Past Surgical History:  Procedure Laterality Date   ABDOMINAL HYSTERECTOMY     ATRIAL FIBRILLATION ABLATION N/A 04/25/2017   Procedure: Atrial Fibrillation Ablation;  Surgeon: Constance Haw, MD;  Location: [facility];  Service: Cardiovascular;  Laterality: N/A;   BREAST EXCISIONAL BIOPSY  Bilateral    benign   CATARACT EXTRACTION W/ INTRAOCULAR LENS  IMPLANT, BILATERAL     CERVICAL LAMINECTOMY     CESAREAN SECTION     X2    LEGS      AGE [age]   SEVERAL SURGERIES FOR POLIO    SVT ABLATION N/A 11/26/2017   Procedure: SVT ABLATION;  Surgeon: Constance Haw, MD;  Location: [facility];  Service: Cardiovascular;  Laterality: N/A;   THYROIDECTOMY      Allergies  Allergies  Allergen Reactions   Sulfa Antibiotics Rash and Other (See Comments)    Childhood allergy   Ciprocinonide [Fluocinolone] Rash   Ciprofibrate Rash   Ciprofloxacin Rash    History of Present Illness    Kaitlyn Hoover is a 81 y.o. female who presents via audio/video conferencing for a telehealth visit today.  Pt was last seen in cardiology clinic on 11/08/2021 by Dr. Curt Bears.  At that time Kaitlyn Hoover was doing well.  The patient is now pending procedure as outlined above. Since her last visit, she has been stable from a cardiac standpoint. She denies chest pain, palpitations, dyspnea, pnd, orthopnea, n, v, dizziness, syncope, edema, weight gain, or early satiety. All other systems reviewed and are otherwise negative except as noted above.   Home Medications    Prior to Admission medications   Medication Sig Start Date End Date Taking? Authorizing Provider  acyclovir (ZOVIRAX) 400 MG tablet Take 400 mg at bedtime by mouth.  02/05/14   [provider]  alendronate (FOSAMAX) 70 MG tablet Take 70 mg by mouth every Sunday. Take with a full glass of water on an empty stomach.    [provider]  apixaban (ELIQUIS) 2.5 MG TABS tablet Take 1 tablet (2.5 mg total) by mouth 2 (two) times daily. 03/07/22   Camnitz, Ocie Doyne, MD  Ascorbic Acid (VITAMIN C) 1000 MG tablet Take 1,000 mg by mouth daily.    [provider]  azelastine (OPTIVAR) 0.05 % ophthalmic solution Place 2 drops into both eyes daily as needed. Inching eyes 04/02/18   [provider]  b complex  vitamins capsule Take 1 capsule by mouth daily.    [provider]  Calcium Citrate-Vitamin D (CALCIUM CITRATE + D PO) Take 1,200 mg by mouth at bedtime.    [provider]  cholecalciferol (VITAMIN D) 25 MCG (1000 UNIT) tablet Take 1,000 Units by mouth daily.    [provider]  Coenzyme Q10 (CO Q-10) 100 MG CAPS Take 100 mg by mouth daily.     [provider]  D-Ribose (RIBOSE, D,) POWD Take 2 g by mouth daily.     [provider]  diltiazem (CARDIZEM CD) 180 MG 24 hr capsule Take 180 mg by mouth daily.    Glenis Smoker, MD  donepezil (ARICEPT) 10 MG tablet Take 1 tablet (10 mg total) by mouth at bedtime. 12/18/21   Cottle, Billey Co., MD  ferrous sulfate 324 MG TBEC Take 324 mg by mouth daily with breakfast.    [provider]  fluticasone (FLONASE) 50 MCG/ACT nasal spray Place 2 sprays into both nostrils daily.    [provider]  GAVILAX 17 GM/SCOOP powder Take 17 g by mouth daily. Miralax 07/03/21   [provider]  Ginkgo Biloba 120 MG TABS Take 240 mg by mouth daily.    [provider]  iloperidone (FANAPT) 4 MG TABS tablet Take 1 tablet (4 mg total) by mouth at bedtime. Patient taking differently: Take 4 mg by mouth at bedtime. Takes 0.25 tablet every night 12/18/21   Cottle, Billey Co., MD  lamoTRIgine (LAMICTAL) 100 MG tablet Take 1 tablet (100 mg total) by mouth daily. 12/18/21   Cottle, Billey Co., MD  levothyroxine (SYNTHROID, LEVOTHROID) 100 MCG tablet Take 100 mcg by mouth daily before breakfast.  02/16/14   [provider]  MAGNESIUM PO Take 250 mg by mouth every evening.    [provider]  Melatonin 3 MG TABS Take 3 mg by mouth at bedtime.    [provider]  Omega-3 Fatty Acids (FISH OIL BURP-LESS) 1200 MG CAPS Take 1,200 mg by mouth daily.    [provider]  ondansetron (ZOFRAN) 8 MG tablet Take 1 tablet (8 mg total) by mouth every 8 (eight) hours as needed  for nausea or vomiting. Patient not taking: Reported on 04/03/2022 11/09/21   Ladell Pier, MD  oxybutynin (DITROPAN XL) 15 MG 24 hr tablet Take 15 mg by mouth daily.     [provider]  rosuvastatin (CRESTOR) 10 MG tablet TAKE 1 TABLET BY MOUTH UP TO 4 TIMES A WEEK ON SUNDAY, MONDAY,WEDNESDAY AND FRIDAY 12/03/21   Camnitz, Ocie Doyne, MD  sodium chloride (OCEAN) 0.65 % SOLN nasal spray Place 2 sprays into both nostrils every evening.    [provider]  temazepam (RESTORIL) 7.5 MG capsule TAKE 1 CAPSULE BY MOUTH AT BEDTIME. 07/05/22   Cottle, Billey Co., MD  traMADol Veatrice Bourbon)  50 MG tablet Take 1 tablet (50 mg total) by mouth 3 (three) times daily as needed. 03/24/19   Sater, Nanine Means, MD  Turmeric 500 MG CAPS Take 1,000 mg by mouth daily.    [provider]  vitamin B-12 (CYANOCOBALAMIN) 1000 MCG tablet Take 1,000 mcg by mouth daily.    [provider]  ziprasidone (GEODON) 40 MG capsule Take 1 capsule (40 mg total) by mouth at bedtime. 12/18/21   Cottle, Billey Co., MD    Physical Exam    Vital Signs:  ROIZY HAROLD does not have vital signs available for review today.  Given telephonic nature of communication, physical exam is limited. AAOx3. NAD. Normal affect.  Speech and respirations are unlabored.  Accessory Clinical Findings    None  Assessment & Plan    1.  Preoperative Cardiovascular Risk Assessment:  According to the Revised Cardiac Risk Index (RCRI), her Perioperative Risk of Major Cardiac Event is (%): 0.9. Her Functional Capacity in METs is: 4.73 according to the Duke Activity Status Index (DASI). Therefore, based on ACC/AHA guidelines, patient would be at acceptable risk for the planned procedure without further cardiovascular testing.  The patient was advised that if she develops new symptoms prior to surgery to contact our office to arrange for a follow-up visit, and she verbalized understanding.  Per office protocol, patient  can hold Eliquis for 2 days prior to procedure.   Patient will not need bridging with Lovenox (enoxaparin) around procedure  A copy of this note will be routed to requesting surgeon.  Time:   Today, I have spent 5 minutes with the patient with telehealth technology discussing medical history, symptoms, and management plan.     Lenna Sciara, NP  08/12/2022, 3:10 PM

## 2022-08-13 DIAGNOSIS — S336XXA Sprain of sacroiliac joint, initial encounter: Secondary | ICD-10-CM | POA: Diagnosis not present

## 2022-08-13 DIAGNOSIS — M9903 Segmental and somatic dysfunction of lumbar region: Secondary | ICD-10-CM | POA: Diagnosis not present

## 2022-08-13 DIAGNOSIS — M62830 Muscle spasm of back: Secondary | ICD-10-CM | POA: Diagnosis not present

## 2022-08-13 DIAGNOSIS — M5416 Radiculopathy, lumbar region: Secondary | ICD-10-CM | POA: Diagnosis not present

## 2022-08-14 DIAGNOSIS — M62830 Muscle spasm of back: Secondary | ICD-10-CM | POA: Diagnosis not present

## 2022-08-14 DIAGNOSIS — S336XXA Sprain of sacroiliac joint, initial encounter: Secondary | ICD-10-CM | POA: Diagnosis not present

## 2022-08-14 DIAGNOSIS — M9903 Segmental and somatic dysfunction of lumbar region: Secondary | ICD-10-CM | POA: Diagnosis not present

## 2022-08-14 DIAGNOSIS — M5416 Radiculopathy, lumbar region: Secondary | ICD-10-CM | POA: Diagnosis not present

## 2022-08-16 DIAGNOSIS — D485 Neoplasm of uncertain behavior of skin: Secondary | ICD-10-CM | POA: Diagnosis not present

## 2022-08-16 DIAGNOSIS — L988 Other specified disorders of the skin and subcutaneous tissue: Secondary | ICD-10-CM | POA: Diagnosis not present

## 2022-08-21 DIAGNOSIS — S336XXA Sprain of sacroiliac joint, initial encounter: Secondary | ICD-10-CM | POA: Diagnosis not present

## 2022-08-21 DIAGNOSIS — M9901 Segmental and somatic dysfunction of cervical region: Secondary | ICD-10-CM | POA: Diagnosis not present

## 2022-08-21 DIAGNOSIS — M62830 Muscle spasm of back: Secondary | ICD-10-CM | POA: Diagnosis not present

## 2022-08-21 DIAGNOSIS — M9903 Segmental and somatic dysfunction of lumbar region: Secondary | ICD-10-CM | POA: Diagnosis not present

## 2022-08-21 DIAGNOSIS — M5416 Radiculopathy, lumbar region: Secondary | ICD-10-CM | POA: Diagnosis not present

## 2022-08-23 DIAGNOSIS — I4891 Unspecified atrial fibrillation: Secondary | ICD-10-CM | POA: Diagnosis not present

## 2022-08-23 DIAGNOSIS — D649 Anemia, unspecified: Secondary | ICD-10-CM | POA: Diagnosis not present

## 2022-08-23 DIAGNOSIS — E785 Hyperlipidemia, unspecified: Secondary | ICD-10-CM | POA: Diagnosis not present

## 2022-08-23 DIAGNOSIS — Z85038 Personal history of other malignant neoplasm of large intestine: Secondary | ICD-10-CM | POA: Diagnosis not present

## 2022-08-23 DIAGNOSIS — E039 Hypothyroidism, unspecified: Secondary | ICD-10-CM | POA: Diagnosis not present

## 2022-08-23 DIAGNOSIS — M549 Dorsalgia, unspecified: Secondary | ICD-10-CM | POA: Diagnosis not present

## 2022-08-27 DIAGNOSIS — M62830 Muscle spasm of back: Secondary | ICD-10-CM | POA: Diagnosis not present

## 2022-08-27 DIAGNOSIS — M9901 Segmental and somatic dysfunction of cervical region: Secondary | ICD-10-CM | POA: Diagnosis not present

## 2022-08-27 DIAGNOSIS — S336XXA Sprain of sacroiliac joint, initial encounter: Secondary | ICD-10-CM | POA: Diagnosis not present

## 2022-08-27 DIAGNOSIS — M9903 Segmental and somatic dysfunction of lumbar region: Secondary | ICD-10-CM | POA: Diagnosis not present

## 2022-08-27 DIAGNOSIS — M5416 Radiculopathy, lumbar region: Secondary | ICD-10-CM | POA: Diagnosis not present

## 2022-08-28 DIAGNOSIS — M9903 Segmental and somatic dysfunction of lumbar region: Secondary | ICD-10-CM | POA: Diagnosis not present

## 2022-08-28 DIAGNOSIS — M9901 Segmental and somatic dysfunction of cervical region: Secondary | ICD-10-CM | POA: Diagnosis not present

## 2022-08-28 DIAGNOSIS — M62830 Muscle spasm of back: Secondary | ICD-10-CM | POA: Diagnosis not present

## 2022-08-28 DIAGNOSIS — S336XXA Sprain of sacroiliac joint, initial encounter: Secondary | ICD-10-CM | POA: Diagnosis not present

## 2022-08-28 DIAGNOSIS — M5416 Radiculopathy, lumbar region: Secondary | ICD-10-CM | POA: Diagnosis not present

## 2022-09-03 DIAGNOSIS — K648 Other hemorrhoids: Secondary | ICD-10-CM | POA: Diagnosis not present

## 2022-09-03 DIAGNOSIS — K5730 Diverticulosis of large intestine without perforation or abscess without bleeding: Secondary | ICD-10-CM | POA: Diagnosis not present

## 2022-09-03 DIAGNOSIS — Z08 Encounter for follow-up examination after completed treatment for malignant neoplasm: Secondary | ICD-10-CM | POA: Diagnosis not present

## 2022-09-03 DIAGNOSIS — Z980 Intestinal bypass and anastomosis status: Secondary | ICD-10-CM | POA: Diagnosis not present

## 2022-09-03 DIAGNOSIS — Z85038 Personal history of other malignant neoplasm of large intestine: Secondary | ICD-10-CM | POA: Diagnosis not present

## 2022-09-04 DIAGNOSIS — M9903 Segmental and somatic dysfunction of lumbar region: Secondary | ICD-10-CM | POA: Diagnosis not present

## 2022-09-04 DIAGNOSIS — M62830 Muscle spasm of back: Secondary | ICD-10-CM | POA: Diagnosis not present

## 2022-09-04 DIAGNOSIS — M5416 Radiculopathy, lumbar region: Secondary | ICD-10-CM | POA: Diagnosis not present

## 2022-09-04 DIAGNOSIS — M9901 Segmental and somatic dysfunction of cervical region: Secondary | ICD-10-CM | POA: Diagnosis not present

## 2022-09-08 ENCOUNTER — Other Ambulatory Visit: Payer: Self-pay | Admitting: Cardiology

## 2022-09-09 DIAGNOSIS — M9901 Segmental and somatic dysfunction of cervical region: Secondary | ICD-10-CM | POA: Diagnosis not present

## 2022-09-09 DIAGNOSIS — M5416 Radiculopathy, lumbar region: Secondary | ICD-10-CM | POA: Diagnosis not present

## 2022-09-09 DIAGNOSIS — M9903 Segmental and somatic dysfunction of lumbar region: Secondary | ICD-10-CM | POA: Diagnosis not present

## 2022-09-09 DIAGNOSIS — M62830 Muscle spasm of back: Secondary | ICD-10-CM | POA: Diagnosis not present

## 2022-09-09 NOTE — Telephone Encounter (Signed)
Prescription refill request for Eliquis received. Indication: AF Last office visit: 08/12/22  E Monge NP Scr: 1.21 on 03/14/22 Age: 81 Weight: 53.4kg   Based on above findings Eliquis 2.'5mg'$  twice daily is the appropriate dose.  Refill approved.

## 2022-09-10 DIAGNOSIS — M62830 Muscle spasm of back: Secondary | ICD-10-CM | POA: Diagnosis not present

## 2022-09-10 DIAGNOSIS — M4726 Other spondylosis with radiculopathy, lumbar region: Secondary | ICD-10-CM | POA: Diagnosis not present

## 2022-09-10 DIAGNOSIS — M5416 Radiculopathy, lumbar region: Secondary | ICD-10-CM | POA: Diagnosis not present

## 2022-09-10 DIAGNOSIS — M9901 Segmental and somatic dysfunction of cervical region: Secondary | ICD-10-CM | POA: Diagnosis not present

## 2022-09-10 DIAGNOSIS — M48062 Spinal stenosis, lumbar region with neurogenic claudication: Secondary | ICD-10-CM | POA: Diagnosis not present

## 2022-09-10 DIAGNOSIS — M4316 Spondylolisthesis, lumbar region: Secondary | ICD-10-CM | POA: Diagnosis not present

## 2022-09-10 DIAGNOSIS — M9903 Segmental and somatic dysfunction of lumbar region: Secondary | ICD-10-CM | POA: Diagnosis not present

## 2022-09-11 DIAGNOSIS — M5416 Radiculopathy, lumbar region: Secondary | ICD-10-CM | POA: Diagnosis not present

## 2022-09-11 DIAGNOSIS — M62830 Muscle spasm of back: Secondary | ICD-10-CM | POA: Diagnosis not present

## 2022-09-11 DIAGNOSIS — M9901 Segmental and somatic dysfunction of cervical region: Secondary | ICD-10-CM | POA: Diagnosis not present

## 2022-09-11 DIAGNOSIS — M9903 Segmental and somatic dysfunction of lumbar region: Secondary | ICD-10-CM | POA: Diagnosis not present

## 2022-09-18 DIAGNOSIS — M9901 Segmental and somatic dysfunction of cervical region: Secondary | ICD-10-CM | POA: Diagnosis not present

## 2022-09-18 DIAGNOSIS — M9903 Segmental and somatic dysfunction of lumbar region: Secondary | ICD-10-CM | POA: Diagnosis not present

## 2022-09-18 DIAGNOSIS — M5416 Radiculopathy, lumbar region: Secondary | ICD-10-CM | POA: Diagnosis not present

## 2022-09-18 DIAGNOSIS — M62830 Muscle spasm of back: Secondary | ICD-10-CM | POA: Diagnosis not present

## 2022-09-25 DIAGNOSIS — M9903 Segmental and somatic dysfunction of lumbar region: Secondary | ICD-10-CM | POA: Diagnosis not present

## 2022-09-25 DIAGNOSIS — M62830 Muscle spasm of back: Secondary | ICD-10-CM | POA: Diagnosis not present

## 2022-09-25 DIAGNOSIS — M9901 Segmental and somatic dysfunction of cervical region: Secondary | ICD-10-CM | POA: Diagnosis not present

## 2022-09-25 DIAGNOSIS — M5416 Radiculopathy, lumbar region: Secondary | ICD-10-CM | POA: Diagnosis not present

## 2022-09-30 ENCOUNTER — Inpatient Hospital Stay: Payer: PPO | Admitting: Oncology

## 2022-09-30 ENCOUNTER — Inpatient Hospital Stay: Payer: PPO | Attending: Oncology

## 2022-09-30 VITALS — BP 148/58 | HR 82 | Temp 98.1°F | Resp 18 | Ht 62.0 in | Wt 119.0 lb

## 2022-09-30 DIAGNOSIS — I4891 Unspecified atrial fibrillation: Secondary | ICD-10-CM | POA: Insufficient documentation

## 2022-09-30 DIAGNOSIS — C184 Malignant neoplasm of transverse colon: Secondary | ICD-10-CM | POA: Diagnosis not present

## 2022-09-30 DIAGNOSIS — Z7901 Long term (current) use of anticoagulants: Secondary | ICD-10-CM | POA: Insufficient documentation

## 2022-09-30 DIAGNOSIS — Z8719 Personal history of other diseases of the digestive system: Secondary | ICD-10-CM | POA: Insufficient documentation

## 2022-09-30 DIAGNOSIS — F319 Bipolar disorder, unspecified: Secondary | ICD-10-CM | POA: Diagnosis not present

## 2022-09-30 DIAGNOSIS — R918 Other nonspecific abnormal finding of lung field: Secondary | ICD-10-CM | POA: Diagnosis not present

## 2022-09-30 DIAGNOSIS — C183 Malignant neoplasm of hepatic flexure: Secondary | ICD-10-CM | POA: Insufficient documentation

## 2022-09-30 LAB — CEA (ACCESS): CEA (CHCC): 6.85 ng/mL — ABNORMAL HIGH (ref 0.00–5.00)

## 2022-09-30 NOTE — Progress Notes (Signed)
  [place] [facility] OFFICE PROGRESS NOTE   Diagnosis: Colon cancer  INTERVAL HISTORY:   Kaitlyn Hoover returns as scheduled.  She feels well.  Good appetite.  No difficulty with bowel function.  No bleeding.  She reports undergoing a colonoscopy last month.  She has low back and right "hip "lash leg discomfort.  She is seeing a Restaurant manager, fast food.  The pain has partially improved.  Objective:  Vital signs in last 24 hours:  Blood pressure (!) 148/58, pulse 82, temperature 98.1 F (36.7 C), temperature source Oral, resp. rate 18, height _0  (1.575 m), weight 119 lb (54 kg), SpO2 100 %.    Lymphatics: No cervical, supraclavicular, axillary, or inguinal nodes Resp: Lungs clear bilaterally Cardio: Regular rate and rhythm GI: No hepatosplenomegaly, no mass, nontender Vascular: No leg edema Musculoskeletal: Mild tenderness at the right posterior iliac, no pain with motion at the right hip, no spine tenderness   Lab Results:  Lab Results  Component Value Date   WBC 6.4 03/14/2022   HGB 12.4 03/14/2022   HCT 36.9 03/14/2022   MCV 107.3 (H) 03/14/2022   PLT 217 03/14/2022   NEUTROABS 4.5 03/14/2022    CMP  Lab Results  Component Value Date   NA 141 03/14/2022   K 4.4 03/14/2022   CL 107 03/14/2022   CO2 26 03/14/2022   GLUCOSE 108 (H) 03/14/2022   BUN 23 03/14/2022   CREATININE 1.21 (H) 03/14/2022   CALCIUM 9.1 03/14/2022   PROT 7.1 03/14/2022   ALBUMIN 4.2 03/14/2022   AST 23 03/14/2022   ALT 10 03/14/2022   ALKPHOS 72 03/14/2022   BILITOT 0.9 03/14/2022   GFRNONAA 45 (L) 03/14/2022   GFRAA 53 (L) 11/02/2020    Lab Results  Component Value Date   CEA1 5.4 (H) 08/06/2021   CEA 6.85 (H) 09/30/2022      Medications: I have reviewed the patient's current medications.   Assessment/Plan:  Colon cancer, hepatic flexure, stage IIIb (pT4a,pN1b), status post a right colectomy 08/15/2021 Tumor invades through the serosa, 3/25 lymph nodes, MSS, no loss of  mismatch repair protein expression CTs 06/26/2021-eccentric wall thickening of the hepatic flexure, tiny pulmonary nodules measuring up to 2 mm-likely infectious or inflammatory Elevated preoperative CEA Cycle 1 Xeloda 09/10/2021 Cycle 2 Xeloda 10/01/2021 Cycle 3 Xeloda 10/22/2021 Cycle 4 Xeloda 11/12/2021-last dose 11/16/2021, hospitalized in [place] 11/18/2021 with small bowel obstruction Cycle 5 Xeloda 12/17/2021 Cycle 6 Xeloda 01/07/2022 Cycle 7 Xeloda 01/28/2022 Cycle 8 Xeloda 02/18/2022 Mild elevation of the CEA CTs 03/19/2022-no evidence of metastatic disease, unchanged tiny pulmonary nodules-likely benign History of colon polyps including on the right colectomy specimen 08/15/2021 Bipolar disease Atrial fibrillation maintained on anticoagulation Subcutaneous lesion right lower chest inferior to the breast-possibly a lipoma Admission to the hospital in [place] 11/18/2021 with nausea/vomiting/diarrhea.  Diagnosed with small bowel obstruction.  Discharged home 11/25/2021.   Disposition: Kaitlyn Hoover is in clinical remission from colon cancer.  There is persistent mild elevation of the CEA.  This is likely a benign finding.  She will return for an office visit and CEA in 3 months.  We will schedule restaging CTs if the CEA is more elevated.  [name], MD  09/30/2022  1:04 PM

## 2022-10-01 ENCOUNTER — Other Ambulatory Visit: Payer: Self-pay | Admitting: Psychiatry

## 2022-10-01 DIAGNOSIS — F250 Schizoaffective disorder, bipolar type: Secondary | ICD-10-CM

## 2022-10-01 NOTE — Telephone Encounter (Signed)
Filled 8/16 appt 12/24/22

## 2022-10-02 DIAGNOSIS — M62830 Muscle spasm of back: Secondary | ICD-10-CM | POA: Diagnosis not present

## 2022-10-02 DIAGNOSIS — M9903 Segmental and somatic dysfunction of lumbar region: Secondary | ICD-10-CM | POA: Diagnosis not present

## 2022-10-02 DIAGNOSIS — M5416 Radiculopathy, lumbar region: Secondary | ICD-10-CM | POA: Diagnosis not present

## 2022-10-02 DIAGNOSIS — M9901 Segmental and somatic dysfunction of cervical region: Secondary | ICD-10-CM | POA: Diagnosis not present

## 2022-10-07 DIAGNOSIS — M62830 Muscle spasm of back: Secondary | ICD-10-CM | POA: Diagnosis not present

## 2022-10-07 DIAGNOSIS — M9903 Segmental and somatic dysfunction of lumbar region: Secondary | ICD-10-CM | POA: Diagnosis not present

## 2022-10-07 DIAGNOSIS — M5416 Radiculopathy, lumbar region: Secondary | ICD-10-CM | POA: Diagnosis not present

## 2022-10-07 DIAGNOSIS — M9901 Segmental and somatic dysfunction of cervical region: Secondary | ICD-10-CM | POA: Diagnosis not present

## 2022-10-08 DIAGNOSIS — E039 Hypothyroidism, unspecified: Secondary | ICD-10-CM | POA: Diagnosis not present

## 2022-10-08 DIAGNOSIS — N289 Disorder of kidney and ureter, unspecified: Secondary | ICD-10-CM | POA: Diagnosis not present

## 2022-10-16 DIAGNOSIS — M9903 Segmental and somatic dysfunction of lumbar region: Secondary | ICD-10-CM | POA: Diagnosis not present

## 2022-10-16 DIAGNOSIS — M9901 Segmental and somatic dysfunction of cervical region: Secondary | ICD-10-CM | POA: Diagnosis not present

## 2022-10-16 DIAGNOSIS — M5416 Radiculopathy, lumbar region: Secondary | ICD-10-CM | POA: Diagnosis not present

## 2022-10-16 DIAGNOSIS — M62830 Muscle spasm of back: Secondary | ICD-10-CM | POA: Diagnosis not present

## 2022-10-29 ENCOUNTER — Other Ambulatory Visit: Payer: Self-pay | Admitting: Cardiology

## 2022-10-30 DIAGNOSIS — M5416 Radiculopathy, lumbar region: Secondary | ICD-10-CM | POA: Diagnosis not present

## 2022-10-30 DIAGNOSIS — M9901 Segmental and somatic dysfunction of cervical region: Secondary | ICD-10-CM | POA: Diagnosis not present

## 2022-10-30 DIAGNOSIS — M62830 Muscle spasm of back: Secondary | ICD-10-CM | POA: Diagnosis not present

## 2022-10-30 DIAGNOSIS — M9903 Segmental and somatic dysfunction of lumbar region: Secondary | ICD-10-CM | POA: Diagnosis not present

## 2022-11-06 DIAGNOSIS — M5416 Radiculopathy, lumbar region: Secondary | ICD-10-CM | POA: Diagnosis not present

## 2022-11-06 DIAGNOSIS — M9901 Segmental and somatic dysfunction of cervical region: Secondary | ICD-10-CM | POA: Diagnosis not present

## 2022-11-06 DIAGNOSIS — M9903 Segmental and somatic dysfunction of lumbar region: Secondary | ICD-10-CM | POA: Diagnosis not present

## 2022-11-06 DIAGNOSIS — M62830 Muscle spasm of back: Secondary | ICD-10-CM | POA: Diagnosis not present

## 2022-11-08 ENCOUNTER — Ambulatory Visit: Payer: PPO | Attending: Cardiology | Admitting: Cardiology

## 2022-11-08 ENCOUNTER — Encounter: Payer: Self-pay | Admitting: Cardiology

## 2022-11-08 VITALS — BP 160/58 | HR 63 | Ht 62.0 in | Wt 119.0 lb

## 2022-11-08 DIAGNOSIS — I480 Paroxysmal atrial fibrillation: Secondary | ICD-10-CM

## 2022-11-08 DIAGNOSIS — I471 Supraventricular tachycardia, unspecified: Secondary | ICD-10-CM

## 2022-11-08 NOTE — Patient Instructions (Signed)
Medication Instructions:  Your physician recommends that you continue on your current medications as directed. Please refer to the Current Medication list given to you today.  *If you need a refill on your cardiac medications before your next appointment, please call your pharmacy*   Lab Work: None ordered   Testing/Procedures: None ordered   Follow-Up: At [facility], you and your health needs are our priority.  As part of our continuing mission to provide you with exceptional heart care, we have created designated Provider Care Teams.  These Care Teams include your primary Cardiologist (physician) and Advanced Practice Providers (APPs -  Physician Assistants and Nurse Practitioners) who all work together to provide you with the care you need, when you need it.  Your next appointment:   1 year(s)  The format for your next appointment:   In Person  Provider:   Will Camnitz, MD    Thank you for choosing [facility]!!   Standley Bargo, RN [phone]  Other Instructions   Important Information About Sugar           

## 2022-11-08 NOTE — Progress Notes (Signed)
Electrophysiology Office Note   Date:  11/08/2022   ID:  Kaitlyn, Hoover 12/14/1940, MRN [id number]  PCP:  Kaitlyn Pepper, MD  Cardiologist:  Marlou Porch Primary Electrophysiologist:  Kaitlyn Pohle Meredith Leeds, MD    No chief complaint on file.    History of Present Illness: Kaitlyn Hoover is a 81 y.o. female who is being seen today for the evaluation of SVT, atrial fibrillation at the request of Kaitlyn Pepper, MD. Presenting today for electrophysiology evaluation.   She has a history significant for atrial fibrillation.  She also has SVT.  She has had multiple ER visits for both.  She is post atrial fibrillation ablation 04/25/2017.  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  Today, denies symptoms of palpitations, chest pain, shortness of breath, orthopnea, PND, lower extremity edema, claudication, dizziness, presyncope, syncope, bleeding, or neurologic sequela. The patient is tolerating medications without difficulties.  Since being seen she has done well.  She has had no chest pain or shortness of breath.  She has recovered from her colon cancer.  She is now cancer free.  She required 6 months of chemotherapy and surgery.    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   Past Surgical History:  Procedure Laterality Date   ABDOMINAL HYSTERECTOMY     ATRIAL FIBRILLATION ABLATION N/A 04/25/2017   Procedure: Atrial Fibrillation Ablation;  Surgeon: Constance Haw, MD;  Location: [facility];  Service: Cardiovascular;  Laterality: N/A;   BREAST EXCISIONAL BIOPSY Bilateral    benign   CATARACT EXTRACTION W/ INTRAOCULAR LENS  IMPLANT, BILATERAL     CERVICAL  LAMINECTOMY     CESAREAN SECTION     X2    LEGS      AGE 55-11   SEVERAL SURGERIES FOR POLIO    SVT ABLATION N/A 11/26/2017   Procedure: SVT ABLATION;  Surgeon: Constance Haw, MD;  Location: [facility];  Service: Cardiovascular;  Laterality: N/A;   THYROIDECTOMY       Current Outpatient Medications  Medication Sig Dispense Refill   acyclovir (ZOVIRAX) 400 MG tablet Take 400 mg at bedtime by mouth.      alendronate (FOSAMAX) 70 MG tablet Take 70 mg by mouth every Sunday. Take with a full glass of water on an empty stomach.     Ascorbic Acid (VITAMIN C) 1000 MG tablet Take 1,000 mg by mouth daily.     azelastine (OPTIVAR) 0.05 % ophthalmic solution Place 2 drops into both eyes daily as needed. Inching eyes     b complex vitamins capsule Take 1 capsule by mouth daily.     Calcium Citrate-Vitamin D (CALCIUM CITRATE + D PO) Take 1,200 mg by mouth at bedtime.     cholecalciferol (VITAMIN D) 25 MCG (1000 UNIT) tablet Take 1,000 Units by mouth daily.     Coenzyme Q10 (CO Q-10) 100 MG CAPS Take 100 mg by mouth daily.      D-Ribose (RIBOSE, D,) POWD Take 2 g by mouth daily.      diltiazem (CARDIZEM CD)  180 MG 24 hr capsule Take 180 mg by mouth daily.     donepezil (ARICEPT) 10 MG tablet Take 1 tablet (10 mg total) by mouth at bedtime. 90 tablet 3   ELIQUIS 2.5 MG TABS tablet TAKE 1 TABLET BY MOUTH TWICE A DAY *STOP ELIQUIS 5 MG* 60 tablet 5   ferrous sulfate 324 MG TBEC Take 324 mg by mouth daily with breakfast.     fluticasone (FLONASE) 50 MCG/ACT nasal spray Place 2 sprays into both nostrils daily.     GAVILAX 17 GM/SCOOP powder Take 17 g by mouth daily. Miralax     Ginkgo Biloba 120 MG TABS Take 240 mg by mouth daily.     iloperidone (FANAPT) 4 MG TABS tablet Take 1 tablet (4 mg total) by mouth at bedtime. (Patient taking differently: Take 1 mg by mouth at bedtime. Takes 0.25 tablet every night) 30 tablet 3   lamoTRIgine (LAMICTAL) 100 MG tablet Take 1 tablet (100 mg total) by  mouth daily. 90 tablet 1   levothyroxine (SYNTHROID, LEVOTHROID) 100 MCG tablet Take 100 mcg by mouth daily before breakfast.      MAGNESIUM PO Take 250 mg by mouth every evening.     Melatonin 3 MG TABS Take 3 mg by mouth at bedtime.     Omega-3 Fatty Acids (FISH OIL BURP-LESS) 1000 MG CAPS Take 3,000 mg by mouth 3 (three) times daily.     oxybutynin (DITROPAN XL) 15 MG 24 hr tablet Take 15 mg by mouth daily.      rosuvastatin (CRESTOR) 10 MG tablet TAKE 1 TABLET BY MOUTH UP TO 4 TIMES A WEEK ON SUNDAY, MONDAY,WEDNESDAY AND FRIDAY 48 tablet 2   sodium chloride (OCEAN) 0.65 % SOLN nasal spray Place 2 sprays into both nostrils every evening.     temazepam (RESTORIL) 7.5 MG capsule TAKE 1 CAPSULE BY MOUTH AT BEDTIME. 30 capsule 5   traMADol (ULTRAM) 50 MG tablet Take 1 tablet (50 mg total) by mouth 3 (three) times daily as needed. 90 tablet 3   Turmeric 500 MG CAPS Take 1,000 mg by mouth daily.     vitamin B-12 (CYANOCOBALAMIN) 1000 MCG tablet Take 1,000 mcg by mouth daily.     ziprasidone (GEODON) 40 MG capsule TAKE 1 CAPSULE BY MOUTH AT BEDTIME. 90 capsule 0   No current facility-administered medications for this visit.    Allergies:   Sulfa antibiotics, Ciprocinonide [fluocinolone], Ciprofibrate, and Ciprofloxacin   Social History:  The patient  reports that she quit smoking about 25 years ago. Her smoking use included cigarettes. She started smoking about 51 years ago. She has a 25.00 pack-year smoking history. She has never used smokeless tobacco. She reports that she does not currently use alcohol. She reports that she does not use drugs.   Family History:  The patient's family history includes Atrial fibrillation in her brother; COPD in her brother; Cancer in her father; Hypertension in her mother.   ROS:  Please see the history of present illness.   Otherwise, review of systems is positive for none.   All other systems are reviewed and negative.   PHYSICAL EXAM: VS:  BP (!) 160/58    Pulse 63   Ht '5\' 2"'$  (1.575 m)   Wt 119 lb (54 kg)   LMP  (LMP Unknown)   SpO2 98%   BMI 21.77 kg/m  , BMI Body mass index is 21.77 kg/m. GEN: Well nourished, well developed, in no acute distress  HEENT: normal  Neck: no JVD, carotid bruits, or masses Cardiac: RRR; no murmurs, rubs, or gallops,no edema  Respiratory:  clear to auscultation bilaterally, normal work of breathing GI: soft, nontender, nondistended, + BS MS: no deformity or atrophy  Skin: warm and dry Neuro:  Strength and sensation are intact Psych: euthymic mood, full affect  EKG:  EKG is ordered today. Personal review of the ekg ordered shows sinus rhythm, rate 63   Recent Labs: 03/14/2022: ALT 10; BUN 23; Creatinine 1.21; Hemoglobin 12.4; Platelet Count 217; Potassium 4.4; Sodium 141    Lipid Panel     Component Value Date/Time   CHOL 136 02/12/2017 1013   TRIG 114 02/12/2017 1013   HDL 46 02/12/2017 1013   CHOLHDL 3.0 02/12/2017 1013   CHOLHDL 3.5 10/22/2016 1039   VLDL 19 10/22/2016 1039   LDLCALC 67 02/12/2017 1013     Wt Readings from Last 3 Encounters:  11/08/22 119 lb (54 kg)  09/30/22 119 lb (54 kg)  07/04/22 117 lb 12.8 oz (53.4 kg)      Other studies Reviewed: Additional studies/ records that were reviewed today include: TTE 2015  Review of the above records today demonstrates:  - Left ventricle: The cavity size was normal. Wall thickness was   normal. Systolic function was normal. The estimated ejection   fraction was in the range of 60% to 65%. Wall motion was normal;   there were no regional wall motion abnormalities. - Left atrium: The atrium was mildly dilated. - Atrial septum: No defect or patent foramen ovale was identified. - Pulmonary arteries: PA peak pressure: 32 mm Hg (S).  30 day monitor 01/08/18 - personally reviewed Sinus rhythm with occasional PACs Possible atrial fibrillation with artifact affecting interpretation  ASSESSMENT AND PLAN:  1.  Paroxysmal atrial  fibrillation: Status post ablation 03/25/2017 without obvious recurrence.  CHA2DS2-VASc 5.  Anticoagulation has been stopped as she has had no further episodes.  She is currently feeling well.  No changes.  2.  Hyperlipidemia: Plan per primary physician  3.  Coronary artery disease: No current chest pain.  Continue Crestor.  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  No obvious recurrence  5.  Hypertension: Patient blood pressure is elevated today.  Elevated on recheck.  She has not taken her blood pressure medications.  She [facility] check it again at home after medications have been taken.  Current medicines are reviewed at length with the patient today.   The patient does not have concerns regarding her medicines.  The following changes were made today: None  Labs/ tests ordered today include:  Orders Placed This Encounter  Procedures   EKG 12-Lead     Disposition:   FU 12 months  Signed, Shalaya Swailes Meredith Leeds, MD  11/08/2022 9:07 AM     [facility] [address] [place] [address] [phone] (office) [phone] (fax)

## 2022-11-20 DIAGNOSIS — M62830 Muscle spasm of back: Secondary | ICD-10-CM | POA: Diagnosis not present

## 2022-11-20 DIAGNOSIS — M9903 Segmental and somatic dysfunction of lumbar region: Secondary | ICD-10-CM | POA: Diagnosis not present

## 2022-11-20 DIAGNOSIS — M5416 Radiculopathy, lumbar region: Secondary | ICD-10-CM | POA: Diagnosis not present

## 2022-11-20 DIAGNOSIS — M9901 Segmental and somatic dysfunction of cervical region: Secondary | ICD-10-CM | POA: Diagnosis not present

## 2022-11-21 DIAGNOSIS — M62830 Muscle spasm of back: Secondary | ICD-10-CM | POA: Diagnosis not present

## 2022-11-21 DIAGNOSIS — M9903 Segmental and somatic dysfunction of lumbar region: Secondary | ICD-10-CM | POA: Diagnosis not present

## 2022-11-21 DIAGNOSIS — M5416 Radiculopathy, lumbar region: Secondary | ICD-10-CM | POA: Diagnosis not present

## 2022-11-21 DIAGNOSIS — M9901 Segmental and somatic dysfunction of cervical region: Secondary | ICD-10-CM | POA: Diagnosis not present

## 2022-11-25 DIAGNOSIS — M5416 Radiculopathy, lumbar region: Secondary | ICD-10-CM | POA: Diagnosis not present

## 2022-11-25 DIAGNOSIS — M9903 Segmental and somatic dysfunction of lumbar region: Secondary | ICD-10-CM | POA: Diagnosis not present

## 2022-11-25 DIAGNOSIS — M9901 Segmental and somatic dysfunction of cervical region: Secondary | ICD-10-CM | POA: Diagnosis not present

## 2022-11-25 DIAGNOSIS — M62830 Muscle spasm of back: Secondary | ICD-10-CM | POA: Diagnosis not present

## 2022-11-29 DIAGNOSIS — D485 Neoplasm of uncertain behavior of skin: Secondary | ICD-10-CM | POA: Diagnosis not present

## 2022-11-29 DIAGNOSIS — D2271 Melanocytic nevi of right lower limb, including hip: Secondary | ICD-10-CM | POA: Diagnosis not present

## 2022-11-29 DIAGNOSIS — Z1283 Encounter for screening for malignant neoplasm of skin: Secondary | ICD-10-CM | POA: Diagnosis not present

## 2022-11-29 DIAGNOSIS — D2272 Melanocytic nevi of left lower limb, including hip: Secondary | ICD-10-CM | POA: Diagnosis not present

## 2022-11-29 DIAGNOSIS — D225 Melanocytic nevi of trunk: Secondary | ICD-10-CM | POA: Diagnosis not present

## 2022-12-04 DIAGNOSIS — M62830 Muscle spasm of back: Secondary | ICD-10-CM | POA: Diagnosis not present

## 2022-12-04 DIAGNOSIS — M5416 Radiculopathy, lumbar region: Secondary | ICD-10-CM | POA: Diagnosis not present

## 2022-12-04 DIAGNOSIS — M9901 Segmental and somatic dysfunction of cervical region: Secondary | ICD-10-CM | POA: Diagnosis not present

## 2022-12-04 DIAGNOSIS — M9903 Segmental and somatic dysfunction of lumbar region: Secondary | ICD-10-CM | POA: Diagnosis not present

## 2022-12-09 DIAGNOSIS — N182 Chronic kidney disease, stage 2 (mild): Secondary | ICD-10-CM | POA: Diagnosis not present

## 2022-12-09 DIAGNOSIS — E039 Hypothyroidism, unspecified: Secondary | ICD-10-CM | POA: Diagnosis not present

## 2022-12-09 DIAGNOSIS — F319 Bipolar disorder, unspecified: Secondary | ICD-10-CM | POA: Diagnosis not present

## 2022-12-09 DIAGNOSIS — I480 Paroxysmal atrial fibrillation: Secondary | ICD-10-CM | POA: Diagnosis not present

## 2022-12-09 DIAGNOSIS — M8580 Other specified disorders of bone density and structure, unspecified site: Secondary | ICD-10-CM | POA: Diagnosis not present

## 2022-12-09 DIAGNOSIS — E785 Hyperlipidemia, unspecified: Secondary | ICD-10-CM | POA: Diagnosis not present

## 2022-12-09 DIAGNOSIS — K219 Gastro-esophageal reflux disease without esophagitis: Secondary | ICD-10-CM | POA: Diagnosis not present

## 2022-12-10 ENCOUNTER — Other Ambulatory Visit: Payer: Self-pay | Admitting: Psychiatry

## 2022-12-10 DIAGNOSIS — F250 Schizoaffective disorder, bipolar type: Secondary | ICD-10-CM

## 2022-12-11 DIAGNOSIS — M9903 Segmental and somatic dysfunction of lumbar region: Secondary | ICD-10-CM | POA: Diagnosis not present

## 2022-12-11 DIAGNOSIS — M62830 Muscle spasm of back: Secondary | ICD-10-CM | POA: Diagnosis not present

## 2022-12-11 DIAGNOSIS — M5416 Radiculopathy, lumbar region: Secondary | ICD-10-CM | POA: Diagnosis not present

## 2022-12-11 DIAGNOSIS — M9901 Segmental and somatic dysfunction of cervical region: Secondary | ICD-10-CM | POA: Diagnosis not present

## 2022-12-16 DIAGNOSIS — M5416 Radiculopathy, lumbar region: Secondary | ICD-10-CM | POA: Diagnosis not present

## 2022-12-16 DIAGNOSIS — M9901 Segmental and somatic dysfunction of cervical region: Secondary | ICD-10-CM | POA: Diagnosis not present

## 2022-12-16 DIAGNOSIS — M62830 Muscle spasm of back: Secondary | ICD-10-CM | POA: Diagnosis not present

## 2022-12-16 DIAGNOSIS — M9903 Segmental and somatic dysfunction of lumbar region: Secondary | ICD-10-CM | POA: Diagnosis not present

## 2022-12-17 DIAGNOSIS — M5416 Radiculopathy, lumbar region: Secondary | ICD-10-CM | POA: Diagnosis not present

## 2022-12-17 DIAGNOSIS — M9901 Segmental and somatic dysfunction of cervical region: Secondary | ICD-10-CM | POA: Diagnosis not present

## 2022-12-17 DIAGNOSIS — M9903 Segmental and somatic dysfunction of lumbar region: Secondary | ICD-10-CM | POA: Diagnosis not present

## 2022-12-17 DIAGNOSIS — M62830 Muscle spasm of back: Secondary | ICD-10-CM | POA: Diagnosis not present

## 2022-12-18 DIAGNOSIS — M9903 Segmental and somatic dysfunction of lumbar region: Secondary | ICD-10-CM | POA: Diagnosis not present

## 2022-12-18 DIAGNOSIS — M9901 Segmental and somatic dysfunction of cervical region: Secondary | ICD-10-CM | POA: Diagnosis not present

## 2022-12-18 DIAGNOSIS — M5416 Radiculopathy, lumbar region: Secondary | ICD-10-CM | POA: Diagnosis not present

## 2022-12-18 DIAGNOSIS — M62830 Muscle spasm of back: Secondary | ICD-10-CM | POA: Diagnosis not present

## 2022-12-21 ENCOUNTER — Other Ambulatory Visit: Payer: Self-pay | Admitting: Psychiatry

## 2022-12-21 DIAGNOSIS — G3184 Mild cognitive impairment, so stated: Secondary | ICD-10-CM

## 2022-12-23 NOTE — Telephone Encounter (Signed)
Has appt with Dr. Clovis Pu tomorrow.

## 2022-12-24 ENCOUNTER — Encounter: Payer: Self-pay | Admitting: Psychiatry

## 2022-12-24 ENCOUNTER — Ambulatory Visit (INDEPENDENT_AMBULATORY_CARE_PROVIDER_SITE_OTHER): Payer: PPO | Admitting: Psychiatry

## 2022-12-24 ENCOUNTER — Other Ambulatory Visit: Payer: Self-pay | Admitting: Psychiatry

## 2022-12-24 DIAGNOSIS — G3184 Mild cognitive impairment, so stated: Secondary | ICD-10-CM

## 2022-12-24 DIAGNOSIS — F250 Schizoaffective disorder, bipolar type: Secondary | ICD-10-CM | POA: Diagnosis not present

## 2022-12-24 DIAGNOSIS — F5105 Insomnia due to other mental disorder: Secondary | ICD-10-CM | POA: Diagnosis not present

## 2022-12-24 DIAGNOSIS — F99 Mental disorder, not otherwise specified: Secondary | ICD-10-CM | POA: Diagnosis not present

## 2022-12-24 DIAGNOSIS — H31003 Unspecified chorioretinal scars, bilateral: Secondary | ICD-10-CM | POA: Diagnosis not present

## 2022-12-24 DIAGNOSIS — H35373 Puckering of macula, bilateral: Secondary | ICD-10-CM | POA: Diagnosis not present

## 2022-12-24 DIAGNOSIS — H5213 Myopia, bilateral: Secondary | ICD-10-CM | POA: Diagnosis not present

## 2022-12-24 MED ORDER — LAMOTRIGINE 100 MG PO TABS: 100.0000 mg | ORAL_TABLET | Freq: Every day | ORAL | 3 refills | Status: DC

## 2022-12-24 MED ORDER — DONEPEZIL HCL 10 MG PO TABS: 10.0000 mg | ORAL_TABLET | Freq: Every day | ORAL | 3 refills | Status: DC

## 2022-12-24 MED ORDER — ZIPRASIDONE HCL 40 MG PO CAPS: 40.0000 mg | ORAL_CAPSULE | Freq: Every day | ORAL | 3 refills | Status: DC

## 2022-12-24 MED ORDER — FANAPT 4 MG PO TABS: 4.0000 mg | ORAL_TABLET | Freq: Every evening | ORAL | 3 refills | Status: DC

## 2022-12-24 MED ORDER — TEMAZEPAM 7.5 MG PO CAPS: 7.5000 mg | ORAL_CAPSULE | Freq: Every day | ORAL | 5 refills | Status: DC

## 2022-12-24 NOTE — Progress Notes (Signed)
[name] [id number] 1940-12-26 82 y.o.     Subjective:   Patient ID:  Kaitlyn Hoover is a 82 y.o. (DOB [birth year]) female.  Chief Complaint:  Chief Complaint  Patient presents with   Follow-up    Schizoaffective disorder, bipolar type ([place])    Anxiety Patient reports no confusion, decreased concentration, dizziness, nervous/anxious behavior or suicidal ideas.    Depression        Associated symptoms include fatigue.  Associated symptoms include no decreased concentration and no suicidal ideas.  Past medical history includes anxiety.    Kaitlyn Hoover presents for follow-up of psych issures  seen November 2020  and no meds were changed.  She called in August 2020  wanting to increase donepezil from 5 to 10 mg daily.  That was done.  03/28/20 appt with the following noted: Deep depression March.  No pcpt other than being confined.  No seasonal changes.  No real problems with depression in years.  Did worry about it.  Even had SI without plan.  Then got better.  Totally better now spontaneously.  She wonders about reducing ziprasidone. Doing OK overall.  Bored.  Not depressed.  Sleeping well with temazepam 30 mg with 8-9 hours.  Doing Zoom with church and looks forward to it.  Otherwise watching TV. Tolerated Aricept and has seen benefit for recall.  No paranoia or fear.   No med changes  06/26/20 TC stating: She's referring to the Temazepam 30 mg, she stopped on Thursday and hasn't slept in  4 days. She asked if eventually she would start sleeping without? I discussed that you would might recommend she go down to 15 mg, she really doesn't want to take it at all but will do as instructed. She does really she has to get some sleep. She's tried Benadryl but not helped either.  Following info was given to her: She has tapered off temazepam before but had to restart it.  She is tried alternatives clonazepam, Tranxene, trazodone, hydroxyzine.  She prefers to avoid meds that  could have weight gain.  She could probably stop temazepam more quickly and easily and still sleep if she took half of the 25 mg quetiapine but it may still give her a hangover. The way we tapered her off temazepam the last time was in the traditional manner of reducing the dose.  She still will have some insomnia but it likely is to be more bearable.  I would suggest we send in a prescription for 15 mg capsules and she take that for 2 to 4 weeks and she can try stopping again if she wants to at that point. If she stops temazepam cold [place] as she is doing currently she is likely to have pretty serious insomnia and very fragmented sleep that will not respond to Benadryl for a period of 2 to 4 weeks.  She has chronic insomnia which is the reason she is taken the temazepam and I am not sure what her sleep will be like once she gets through the withdrawal phase of stopping the temazepam. Pt via TC stated: Rtc to [facility] and she would prefer the 15 mg capsule of Temazepam, take 1 at hs. Rx called into her [facility] [address] Discussed her chronic insomnia and she verbalized understanding. Advised to call back with worsening condition or not effective for sleep.   07/05/20 appt with the following noted: Hell trying to stop temazepam 30 mg cold [place].  Couldn't sleep for days.  Sleep is fine on 15 mg HS however. No interest, not going to church since Pandemic.  Not worrying much about Covid.  Got vaccine.  Can't motivate herself to go get clothes or yard work.  Just sits and watches TV or reads.  Only social with friends once monthly.  Don't understand while not going to church.  Unmotivated and some sadness. No sig worry or fear other than about her mental health and no paranoia. Doctor said iron was low.  Recent UTI and ABx helped energy.  Donepezil has really helped with her memory she is well pleased.  Patient denies any suicidal ideation. Plan: Added lamotrigine for depression  09/07/20 appt with  following noted: More motivated and active and productive and less napping.  Big difference with addtion of lamotrigine.  Got benefit by 50 mg daily. NO SE. Depression resolved "definitely" No paranoia.  Nor fearfulness nor mood swings. Geodon and Fanapt doses are perfect.  Mild mouth movements but not severe. Sleep well with temazepam. Plan: no med changes  03/08/21 appt noted: Just not having any trouble.  Got me on the right medication.  Occ a little down but it's ok. No SE with lamotrigine. Had a fall and hurt back.  Not much better and had her down some.  Don't get out much. 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  Not going to church bc it's hard. Sleep OK No paranoia.  09/06/21 appt noted: Colon CA dx and surg 3 weeks ago. 3 positive nodes and started chemo soon. Been awful.  Depressed  since surgery.  Not really bad.  Feels drugged in AM until about 1 PM No change in meds.  Sleep 10 -8.    No insomnia.   Worried over chemo and how it will affect her.  Not worried over cancer. No paranoia since here. Plan: Yes reduce temazepam to 7.5 mg HS Continue ziprasidone 40, Fanapt 4 mg 1/4 tablet daily, lamotrigine 100  12/18/21 appt noted: Doing OK. Sleeping fine with temazepam 7.5 mg HS No mood problems or swings.  No fear or anxiety.  No paranoia. Satisfied with meds.  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 Otherwise satisfied with activity.  06/18/22 appt noted: Psych meds: donepezil 10, lamotrigine 100, Fanapt 1 mg and Geodon 40 mg daily without changes. Fine without complaints.  Is tired a lot.  No napping but does lay down. Taking one capsule nightly of temazepam and she thinks it's 15 mg but computer shows 7.5 mg HS . Sleep 9 hours. No paranoia.  Not sig depression but does lay down a couple of times daily.  Not sad.  Not a lot of interest.  Is bored.  Occ finds Netflix interests.  Will procrastinate things.   Health has been stable. Cancer free (hx colon  CA) Finished chemo.   Pleased with meds and no new SE concerns.  No clenching any more with Fanapt added to Geodon.  12/24/22 appt noted: Had to put done dog of 18 years.   Other wise had been ok.   Psych meds: donepezil 10, lamotrigine 100, Fanapt 1 mg and Geodon 40 mg daily, temazepam 7.5 mg HS without changes. Otherwise ok re: mood.  No fear or paranoia at all. Sleep pretty good except awhile to get to sleep.  Then 8-9 hours.   No new health problems. Memory is surprisingly good.   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  Past Psychiatric Medication Trials: Olanzapine haloperidol, Abilify, Latuda, Saphris, Geodon 60 , Fanapt 2 mg,  Depakote 250 mg 3 times d aily, benztropine, Artane,  clonazepam side effects, Tranxene increased appetite, temazepam,  trazodone, hydroxyzine She has been under the care of this office since 2003.  Review of Systems:  Review of Systems  Constitutional:  Positive for fatigue.  HENT:         Jaw clenching stable.  Musculoskeletal:  Positive for back pain and gait problem.  Neurological:  Positive for facial asymmetry and weakness. Negative for dizziness and tremors.  Psychiatric/Behavioral:  Negative for agitation, behavioral problems, confusion, decreased concentration, dysphoric mood, hallucinations, self-injury, sleep disturbance and suicidal ideas. The patient is not nervous/anxious and is not hyperactive.     Medications: I have reviewed the patient's current medications.  Current Outpatient Medications  Medication Sig Dispense Refill   acyclovir (ZOVIRAX) 400 MG tablet Take 400 mg at bedtime by mouth.      alendronate (FOSAMAX) 70 MG tablet Take 70 mg by mouth every Sunday. Take with a full glass of water on an empty stomach.     Ascorbic Acid (VITAMIN C) 1000 MG tablet Take 1,000 mg by mouth daily.     azelastine (OPTIVAR) 0.05 % ophthalmic solution Place 2  drops into both eyes daily as needed. Inching eyes     b complex vitamins capsule Take 1 capsule by mouth daily.     Calcium Citrate-Vitamin D (CALCIUM CITRATE + D PO) Take 1,200 mg by mouth at bedtime.     cholecalciferol (VITAMIN D) 25 MCG (1000 UNIT) tablet Take 1,000 Units by mouth daily.     Coenzyme Q10 (CO Q-10) 100 MG CAPS Take 100 mg by mouth daily.      D-Ribose (RIBOSE, D,) POWD Take 2 g by mouth daily.      diltiazem (CARDIZEM CD) 180 MG 24 hr capsule Take 180 mg by mouth daily.     donepezil (ARICEPT) 10 MG tablet Take 1 tablet (10 mg total) by mouth at bedtime. 90 tablet 3   ELIQUIS 2.5 MG TABS tablet TAKE 1 TABLET BY MOUTH TWICE A DAY *STOP ELIQUIS 5 MG* 60 tablet 5   ferrous sulfate 324 MG TBEC Take 324 mg by mouth daily with breakfast.     fluticasone (FLONASE) 50 MCG/ACT nasal spray Place 2 sprays into both nostrils daily.     GAVILAX 17 GM/SCOOP powder Take 17 g by mouth daily. Miralax     Ginkgo Biloba 120 MG TABS Take 240 mg by mouth daily.     iloperidone (FANAPT) 4 MG TABS tablet Take 1 tablet (4 mg total) by mouth at bedtime. (Patient taking differently: Take 1 mg by mouth at bedtime. Takes 0.25 tablet every night) 30 tablet 3   lamoTRIgine (LAMICTAL) 100 MG tablet TAKE 1 TABLET BY MOUTH EVERY DAY 90 tablet 0   levothyroxine (SYNTHROID, LEVOTHROID) 100 MCG tablet Take 100 mcg by mouth daily before breakfast.      MAGNESIUM PO Take 250 mg by mouth every evening.     Melatonin 3 MG TABS Take 3 mg by mouth at bedtime.     Omega-3 Fatty Acids (FISH OIL BURP-LESS) 1000 MG CAPS Take 3,000 mg by mouth 3 (three) times daily.     oxybutynin (DITROPAN XL) 15 MG 24 hr tablet Take 15 mg by mouth daily.      rosuvastatin (CRESTOR) 10 MG tablet TAKE 1 TABLET BY MOUTH UP TO 4  TIMES A WEEK ON SUNDAY, MONDAY,WEDNESDAY AND FRIDAY 48 tablet 2   sodium chloride (OCEAN) 0.65 % SOLN nasal spray Place 2 sprays into both nostrils every evening.     temazepam (RESTORIL) 7.5 MG capsule TAKE 1  CAPSULE BY MOUTH AT BEDTIME. 30 capsule 5   traMADol (ULTRAM) 50 MG tablet Take 1 tablet (50 mg total) by mouth 3 (three) times daily as needed. 90 tablet 3   Turmeric 500 MG CAPS Take 1,000 mg by mouth daily.     vitamin B-12 (CYANOCOBALAMIN) 1000 MCG tablet Take 1,000 mcg by mouth daily.     ziprasidone (GEODON) 40 MG capsule TAKE 1 CAPSULE BY MOUTH AT BEDTIME. 90 capsule 0   No current facility-administered medications for this visit.    Medication Side Effects: Other: EPS prone.  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    Family History  Problem Relation Age of Onset   Hypertension Mother    Cancer Father        JAW BONE   COPD Brother    Atrial fibrillation Brother     Social History   Socioeconomic History   Marital status: Divorced    Spouse name: Not on file   Number of children: Not on file   Years of education: Not on file   Highest education level: Not on file  Occupational History   Not on file  Tobacco Use   Smoking status: Former    Packs/day: 1.00    Years: 25.00    Total pack years: 25.00    Types: Cigarettes    Start date: 84    Quit date: 1998    Years since quitting: 26.1   Smokeless tobacco: Never   Tobacco comments:    QUIT SMOKING IN 1998  Vaping Use   Vaping Use: Never used  Substance and Sexual Activity   Alcohol use: Not Currently   Drug use: No   Sexual activity: Not on file  Other Topics Concern   Not on file  Social  History Narrative   Not on file   Social Determinants of Health   Financial Resource Strain: Not on file  Food Insecurity: Not on file  Transportation Needs: Not on file  Physical Activity: Not on file  Stress: Not on file  Social Connections: Not on file  Intimate Partner Violence: Not on file    Past Medical History, Surgical history, Social history, and Family history were reviewed and updated as appropriate.   Please see review of systems for further details on the patient's review from today.   Objective:   Physical Exam:  LMP  (LMP Unknown)   Physical Exam Constitutional:      General: She is not in acute distress. Musculoskeletal:        General: No deformity.  Neurological:     Mental Status: She is alert  and oriented to person, place, and time.     Cranial Nerves: Dysarthria present.     Coordination: Coordination normal.     Comments: Mild jaw clenching and jaw movements not severe.  Psychiatric:        Attention and Perception: Attention and perception normal. She does not perceive auditory or visual hallucinations.        Mood and Affect: Mood is not anxious or depressed. Affect is not labile, blunt or angry.        Speech: Speech normal. Speech is not slurred.        Behavior: Behavior normal. Behavior is not agitated. Behavior is cooperative.        Thought Content: Thought content normal. Thought content is not paranoid or delusional. Thought content does not include homicidal or suicidal ideation. Thought content does not include suicidal plan.        Cognition and Memory: Cognition and memory normal.        Judgment: Judgment normal.     Comments: History of paranoia in remission       Lab Review:     Component Value Date/Time   NA 141 03/14/2022 1306   NA 142 11/02/2020 1146   K 4.4 03/14/2022 1306   CL 107 03/14/2022 1306   CO2 26 03/14/2022 1306   GLUCOSE 108 (H) 03/14/2022 1306   BUN 23 03/14/2022 1306   BUN 15 11/02/2020 1146    CREATININE 1.21 (H) 03/14/2022 1306   CALCIUM 9.1 03/14/2022 1306   PROT 7.1 03/14/2022 1306   PROT 7.2 02/12/2017 1013   ALBUMIN 4.2 03/14/2022 1306   ALBUMIN 4.0 02/12/2017 1013   AST 23 03/14/2022 1306   ALT 10 03/14/2022 1306   ALKPHOS 72 03/14/2022 1306   BILITOT 0.9 03/14/2022 1306   GFRNONAA 45 (L) 03/14/2022 1306   GFRAA 53 (L) 11/02/2020 1146       Component Value Date/Time   WBC 6.4 03/14/2022 1306   WBC 10.8 (H) 08/17/2021 0428   RBC 3.44 (L) 03/14/2022 1306   HGB 12.4 03/14/2022 1306   HGB 11.8 11/02/2020 1146   HCT 36.9 03/14/2022 1306   HCT 34.4 11/02/2020 1146   PLT 217 03/14/2022 1306   PLT 252 11/02/2020 1146   MCV 107.3 (H) 03/14/2022 1306   MCV 92 11/02/2020 1146   MCH 36.0 (H) 03/14/2022 1306   MCHC 33.6 03/14/2022 1306   RDW 17.0 (H) 03/14/2022 1306   RDW 12.0 11/02/2020 1146   LYMPHSABS 1.4 03/14/2022 1306   LYMPHSABS 1.8 04/11/2017 0911   MONOABS 0.5 03/14/2022 1306   EOSABS 0.1 03/14/2022 1306   EOSABS 0.2 04/11/2017 0911   BASOSABS 0.1 03/14/2022 1306   BASOSABS 0.1 04/11/2017 0911    No results found for: "POCLITH", "LITHIUM"   No results found for: "PHENYTOIN", "PHENOBARB", "VALPROATE", "CBMZ"   .res Assessment: Plan:    Schizoaffective disorder, bipolar type ([place])  Insomnia due to other mental disorder  Mild cognitive impairment   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 It does not appear severe enough to warrant additional medication.  Greater than 50% of 30 min face to face time with patient was spent on counseling and coordination of care.  K has a long history of repeated episodes of paranoia associated with hypomania at times and at times independent of mood changes.  WE discussed her med-sensitivity.  As noted above she has tried multiple different antipsychotics and different antipsychotic  combinations.  Discussed potential metabolic side effects associated with atypical  antipsychotics, as well as potential risk for movement side effects. Advised pt to contact office if movement side effects occur.  She has mild dysarthria which may be related but it is no worse and it stable.  She is very specifically aware of the risk of tardive dyskinesia.   The unusual combination of antipsychotics is helpful in that the [place] actually offsets some of the dystonia that the Geodon tends to cause.  Lower dose of antipsychotics has led to worsening paranoia.  Her paranoia is well controlled at the current combination of medications.  Depression resolved with lamotrigine   Supportive therapy over dealing with cancer and mental health piece of it. She's doing well now.  We discussed the short-term risks associated with benzodiazepines including sedation and increased fall risk among others.  Discussed long-term side effect risk including dependence, potential withdrawal symptoms, and the potential eventual dose-related risk of dementia.  But recent studies from 2020 dispute this association between benzodiazepines and dementia risk. Newer studies in 2020 do not support an association with dementia.  We have tried to taper her off benzodiazepines for sleep and she was able to be off for a short period of time but then had recurrence of insomnia.  She is aware of the risks but the benefit for sleep is noteworthy and she is failed multiple other sleep meds.   Discussed cost concerns with insurance currently not wanting to cover temazepam. Ok with reduced temazepam to 7.5 mg HS She thinks she is taking 15 mg temazepam but PDMP says the dose has been 7.5 since October of last year.  Asked her to check the bottle when she gets home and call back.  She called back and she is taking 7.5 mg temazepam and not 15 mg.  Continue ziprasidone 40, Fanapt 4 mg 1/4 tablet daily,  lamotrigine 100 Aricept 10 helped memory.  No SE. Still notices benefit. Temazepam 7.5 mg HS No med changes.  She has  had worsening paranoia at lower doses of ziprasidone in the past.  No problems at 40 mg daily  Follow-up 6 months  Lynder Parents MD, DFAPA   Please see After Visit Summary for patient specific instructions.  Future Appointments  Date Time Provider [place]  01/03/2023 11:15 AM [facility] PHLEBOTOMIST [facility] None  01/03/2023 11:40 AM Ladell Pier, MD [facility] None    No orders of the defined types were placed in this encounter.      -------------------------------

## 2022-12-25 DIAGNOSIS — M9903 Segmental and somatic dysfunction of lumbar region: Secondary | ICD-10-CM | POA: Diagnosis not present

## 2022-12-25 DIAGNOSIS — M5416 Radiculopathy, lumbar region: Secondary | ICD-10-CM | POA: Diagnosis not present

## 2022-12-25 DIAGNOSIS — M9901 Segmental and somatic dysfunction of cervical region: Secondary | ICD-10-CM | POA: Diagnosis not present

## 2022-12-25 DIAGNOSIS — M62830 Muscle spasm of back: Secondary | ICD-10-CM | POA: Diagnosis not present

## 2023-01-01 DIAGNOSIS — M9903 Segmental and somatic dysfunction of lumbar region: Secondary | ICD-10-CM | POA: Diagnosis not present

## 2023-01-01 DIAGNOSIS — M5416 Radiculopathy, lumbar region: Secondary | ICD-10-CM | POA: Diagnosis not present

## 2023-01-01 DIAGNOSIS — M62830 Muscle spasm of back: Secondary | ICD-10-CM | POA: Diagnosis not present

## 2023-01-01 DIAGNOSIS — M9901 Segmental and somatic dysfunction of cervical region: Secondary | ICD-10-CM | POA: Diagnosis not present

## 2023-01-03 ENCOUNTER — Telehealth: Payer: Self-pay | Admitting: *Deleted

## 2023-01-03 ENCOUNTER — Inpatient Hospital Stay: Payer: PPO | Admitting: Oncology

## 2023-01-03 ENCOUNTER — Inpatient Hospital Stay: Payer: PPO | Attending: Oncology

## 2023-01-03 VITALS — BP 172/60 | HR 76 | Temp 98.0°F | Resp 20 | Ht 62.0 in | Wt 125.0 lb

## 2023-01-03 DIAGNOSIS — C184 Malignant neoplasm of transverse colon: Secondary | ICD-10-CM

## 2023-01-03 DIAGNOSIS — C183 Malignant neoplasm of hepatic flexure: Secondary | ICD-10-CM | POA: Insufficient documentation

## 2023-01-03 DIAGNOSIS — Z9049 Acquired absence of other specified parts of digestive tract: Secondary | ICD-10-CM | POA: Insufficient documentation

## 2023-01-03 DIAGNOSIS — I4891 Unspecified atrial fibrillation: Secondary | ICD-10-CM | POA: Insufficient documentation

## 2023-01-03 DIAGNOSIS — Z8719 Personal history of other diseases of the digestive system: Secondary | ICD-10-CM | POA: Diagnosis not present

## 2023-01-03 DIAGNOSIS — F319 Bipolar disorder, unspecified: Secondary | ICD-10-CM | POA: Diagnosis not present

## 2023-01-03 DIAGNOSIS — R918 Other nonspecific abnormal finding of lung field: Secondary | ICD-10-CM | POA: Insufficient documentation

## 2023-01-03 DIAGNOSIS — Z7901 Long term (current) use of anticoagulants: Secondary | ICD-10-CM | POA: Diagnosis not present

## 2023-01-03 LAB — CEA (ACCESS): CEA (CHCC): 6.64 ng/mL — ABNORMAL HIGH (ref 0.00–5.00)

## 2023-01-03 NOTE — Telephone Encounter (Signed)
-----   Message from Ladell Pier, MD sent at 01/03/2023  1:13 PM EST ----- Please call patient, the CEA is stable, follow-up as scheduled

## 2023-01-03 NOTE — Progress Notes (Signed)
  [facility] OFFICE PROGRESS NOTE   Diagnosis: Colon cancer  INTERVAL HISTORY:   [name] returns as scheduled.  She feels well.  Good appetite.  No difficulty with bowel function.  No complaint. She had a negative colonoscopy 09/03/2022. Objective:  Vital signs in last 24 hours:  Blood pressure (!) 172/60, pulse 76, temperature 98 F (36.7 C), temperature source Oral, resp. rate 20, height 5' 2"$  (1.575 m), weight 125 lb (56.7 kg), SpO2 98 %.    HEENT: Oropharynx without visible mass, neck without mass less  Lymphatics:  less than 1 cm bilateral posterior cervical nodes, no other cervical, supraclavicular, axillary, or inguinal nodes Resp: Lungs clear bilaterally Cardio: Regular rate and rhythm GI: No hepatosplenomegaly, no mass, nontender Vascular: No leg edema   Lab Results:  Lab Results  Component Value Date   WBC 6.4 03/14/2022   HGB 12.4 03/14/2022   HCT 36.9 03/14/2022   MCV 107.3 (H) 03/14/2022   PLT 217 03/14/2022   NEUTROABS 4.5 03/14/2022    CMP  Lab Results  Component Value Date   NA 141 03/14/2022   K 4.4 03/14/2022   CL 107 03/14/2022   CO2 26 03/14/2022   GLUCOSE 108 (H) 03/14/2022   BUN 23 03/14/2022   CREATININE 1.21 (H) 03/14/2022   CALCIUM 9.1 03/14/2022   PROT 7.1 03/14/2022   ALBUMIN 4.2 03/14/2022   AST 23 03/14/2022   ALT 10 03/14/2022   ALKPHOS 72 03/14/2022   BILITOT 0.9 03/14/2022   GFRNONAA 45 (L) 03/14/2022   GFRAA 53 (L) 11/02/2020    Lab Results  Component Value Date   CEA1 5.4 (H) 08/06/2021   CEA 6.85 (H) 09/30/2022     Medications: I have reviewed the patient's current medications.   Assessment/Plan:  Colon cancer, hepatic flexure, stage IIIb (pT4a,pN1b), status post a right colectomy 08/15/2021 Tumor invades through the serosa, 3/25 lymph nodes, MSS, no loss of mismatch repair protein expression CTs 06/26/2021-eccentric wall thickening of the hepatic flexure, tiny pulmonary nodules measuring up to  2 mm-likely infectious or inflammatory Elevated preoperative CEA Cycle 1 Xeloda 09/10/2021 Cycle 2 Xeloda 10/01/2021 Cycle 3 Xeloda 10/22/2021 Cycle 4 Xeloda 11/12/2021-last dose 11/16/2021, hospitalized in [place] 11/18/2021 with small bowel obstruction Cycle 5 Xeloda 12/17/2021 Cycle 6 Xeloda 01/07/2022 Cycle 7 Xeloda 01/28/2022 Cycle 8 Xeloda 02/18/2022 Mild elevation of the CEA CTs 03/19/2022-no evidence of metastatic disease, unchanged tiny pulmonary nodules-likely benign Colonoscopy 09/03/2022-negative History of colon polyps including on the right colectomy specimen 08/15/2021, negative colonoscopy 09/03/2022 Bipolar disease Atrial fibrillation maintained on anticoagulation Subcutaneous lesion right lower chest inferior to the breast-possibly a lipoma Admission to the hospital in [place] 11/18/2021 with nausea/vomiting/diarrhea.  Diagnosed with small bowel obstruction.  Discharged home 11/25/2021. Small bilateral posterior cervical nodes on exam 01/03/2023    Disposition: Kaitlyn Hoover is in clinical remission from colon cancer.  We will follow-up on the CEA from today.  She will be scheduled for an office visit and surveillance CTs in 3 months.  We will schedule the CT sooner if the CEA has increased significantly today.    [name], MD  01/03/2023  11:40 AM

## 2023-01-03 NOTE — Telephone Encounter (Signed)
Notified patient of stable CEA and F/U as scheduled.

## 2023-01-04 ENCOUNTER — Other Ambulatory Visit: Payer: Self-pay | Admitting: Psychiatry

## 2023-01-04 DIAGNOSIS — F5105 Insomnia due to other mental disorder: Secondary | ICD-10-CM

## 2023-01-07 DIAGNOSIS — M9903 Segmental and somatic dysfunction of lumbar region: Secondary | ICD-10-CM | POA: Diagnosis not present

## 2023-01-07 DIAGNOSIS — M9901 Segmental and somatic dysfunction of cervical region: Secondary | ICD-10-CM | POA: Diagnosis not present

## 2023-01-07 DIAGNOSIS — M62830 Muscle spasm of back: Secondary | ICD-10-CM | POA: Diagnosis not present

## 2023-01-07 DIAGNOSIS — M5416 Radiculopathy, lumbar region: Secondary | ICD-10-CM | POA: Diagnosis not present

## 2023-01-15 DIAGNOSIS — M5416 Radiculopathy, lumbar region: Secondary | ICD-10-CM | POA: Diagnosis not present

## 2023-01-15 DIAGNOSIS — M9903 Segmental and somatic dysfunction of lumbar region: Secondary | ICD-10-CM | POA: Diagnosis not present

## 2023-01-15 DIAGNOSIS — M9901 Segmental and somatic dysfunction of cervical region: Secondary | ICD-10-CM | POA: Diagnosis not present

## 2023-01-15 DIAGNOSIS — M62830 Muscle spasm of back: Secondary | ICD-10-CM | POA: Diagnosis not present

## 2023-01-22 DIAGNOSIS — M9901 Segmental and somatic dysfunction of cervical region: Secondary | ICD-10-CM | POA: Diagnosis not present

## 2023-01-22 DIAGNOSIS — M62830 Muscle spasm of back: Secondary | ICD-10-CM | POA: Diagnosis not present

## 2023-01-22 DIAGNOSIS — M5416 Radiculopathy, lumbar region: Secondary | ICD-10-CM | POA: Diagnosis not present

## 2023-01-22 DIAGNOSIS — M9903 Segmental and somatic dysfunction of lumbar region: Secondary | ICD-10-CM | POA: Diagnosis not present

## 2023-02-03 DIAGNOSIS — M9903 Segmental and somatic dysfunction of lumbar region: Secondary | ICD-10-CM | POA: Diagnosis not present

## 2023-02-03 DIAGNOSIS — M62830 Muscle spasm of back: Secondary | ICD-10-CM | POA: Diagnosis not present

## 2023-02-03 DIAGNOSIS — M5416 Radiculopathy, lumbar region: Secondary | ICD-10-CM | POA: Diagnosis not present

## 2023-02-03 DIAGNOSIS — M9901 Segmental and somatic dysfunction of cervical region: Secondary | ICD-10-CM | POA: Diagnosis not present

## 2023-02-12 DIAGNOSIS — M62830 Muscle spasm of back: Secondary | ICD-10-CM | POA: Diagnosis not present

## 2023-02-12 DIAGNOSIS — M5416 Radiculopathy, lumbar region: Secondary | ICD-10-CM | POA: Diagnosis not present

## 2023-02-12 DIAGNOSIS — M9901 Segmental and somatic dysfunction of cervical region: Secondary | ICD-10-CM | POA: Diagnosis not present

## 2023-02-12 DIAGNOSIS — M9903 Segmental and somatic dysfunction of lumbar region: Secondary | ICD-10-CM | POA: Diagnosis not present

## 2023-02-17 ENCOUNTER — Other Ambulatory Visit: Payer: Self-pay | Admitting: Family Medicine

## 2023-02-17 DIAGNOSIS — Z1231 Encounter for screening mammogram for malignant neoplasm of breast: Secondary | ICD-10-CM

## 2023-02-19 DIAGNOSIS — M9903 Segmental and somatic dysfunction of lumbar region: Secondary | ICD-10-CM | POA: Diagnosis not present

## 2023-02-19 DIAGNOSIS — M5416 Radiculopathy, lumbar region: Secondary | ICD-10-CM | POA: Diagnosis not present

## 2023-02-19 DIAGNOSIS — M9901 Segmental and somatic dysfunction of cervical region: Secondary | ICD-10-CM | POA: Diagnosis not present

## 2023-02-19 DIAGNOSIS — M62830 Muscle spasm of back: Secondary | ICD-10-CM | POA: Diagnosis not present

## 2023-02-26 DIAGNOSIS — M62830 Muscle spasm of back: Secondary | ICD-10-CM | POA: Diagnosis not present

## 2023-02-26 DIAGNOSIS — M5416 Radiculopathy, lumbar region: Secondary | ICD-10-CM | POA: Diagnosis not present

## 2023-02-26 DIAGNOSIS — M9901 Segmental and somatic dysfunction of cervical region: Secondary | ICD-10-CM | POA: Diagnosis not present

## 2023-02-26 DIAGNOSIS — M9903 Segmental and somatic dysfunction of lumbar region: Secondary | ICD-10-CM | POA: Diagnosis not present

## 2023-03-07 DIAGNOSIS — Z85038 Personal history of other malignant neoplasm of large intestine: Secondary | ICD-10-CM | POA: Diagnosis not present

## 2023-03-07 DIAGNOSIS — Z23 Encounter for immunization: Secondary | ICD-10-CM | POA: Diagnosis not present

## 2023-03-07 DIAGNOSIS — M810 Age-related osteoporosis without current pathological fracture: Secondary | ICD-10-CM | POA: Diagnosis not present

## 2023-03-07 DIAGNOSIS — D6869 Other thrombophilia: Secondary | ICD-10-CM | POA: Diagnosis not present

## 2023-03-07 DIAGNOSIS — I700 Atherosclerosis of aorta: Secondary | ICD-10-CM | POA: Diagnosis not present

## 2023-03-07 DIAGNOSIS — N183 Chronic kidney disease, stage 3 unspecified: Secondary | ICD-10-CM | POA: Diagnosis not present

## 2023-03-07 DIAGNOSIS — F319 Bipolar disorder, unspecified: Secondary | ICD-10-CM | POA: Diagnosis not present

## 2023-03-07 DIAGNOSIS — E785 Hyperlipidemia, unspecified: Secondary | ICD-10-CM | POA: Diagnosis not present

## 2023-03-07 DIAGNOSIS — E039 Hypothyroidism, unspecified: Secondary | ICD-10-CM | POA: Diagnosis not present

## 2023-03-07 DIAGNOSIS — I480 Paroxysmal atrial fibrillation: Secondary | ICD-10-CM | POA: Diagnosis not present

## 2023-03-07 DIAGNOSIS — Z0000 Encounter for general adult medical examination without abnormal findings: Secondary | ICD-10-CM | POA: Diagnosis not present

## 2023-03-12 DIAGNOSIS — M5416 Radiculopathy, lumbar region: Secondary | ICD-10-CM | POA: Diagnosis not present

## 2023-03-12 DIAGNOSIS — M9903 Segmental and somatic dysfunction of lumbar region: Secondary | ICD-10-CM | POA: Diagnosis not present

## 2023-03-12 DIAGNOSIS — N390 Urinary tract infection, site not specified: Secondary | ICD-10-CM | POA: Diagnosis not present

## 2023-03-12 DIAGNOSIS — M62830 Muscle spasm of back: Secondary | ICD-10-CM | POA: Diagnosis not present

## 2023-03-12 DIAGNOSIS — R3915 Urgency of urination: Secondary | ICD-10-CM | POA: Diagnosis not present

## 2023-03-12 DIAGNOSIS — N183 Chronic kidney disease, stage 3 unspecified: Secondary | ICD-10-CM | POA: Diagnosis not present

## 2023-03-12 DIAGNOSIS — I4891 Unspecified atrial fibrillation: Secondary | ICD-10-CM | POA: Diagnosis not present

## 2023-03-12 DIAGNOSIS — D6869 Other thrombophilia: Secondary | ICD-10-CM | POA: Diagnosis not present

## 2023-03-12 DIAGNOSIS — I700 Atherosclerosis of aorta: Secondary | ICD-10-CM | POA: Diagnosis not present

## 2023-03-12 DIAGNOSIS — M810 Age-related osteoporosis without current pathological fracture: Secondary | ICD-10-CM | POA: Diagnosis not present

## 2023-03-12 DIAGNOSIS — M9901 Segmental and somatic dysfunction of cervical region: Secondary | ICD-10-CM | POA: Diagnosis not present

## 2023-03-19 DIAGNOSIS — M9901 Segmental and somatic dysfunction of cervical region: Secondary | ICD-10-CM | POA: Diagnosis not present

## 2023-03-19 DIAGNOSIS — M5416 Radiculopathy, lumbar region: Secondary | ICD-10-CM | POA: Diagnosis not present

## 2023-03-19 DIAGNOSIS — M9903 Segmental and somatic dysfunction of lumbar region: Secondary | ICD-10-CM | POA: Diagnosis not present

## 2023-03-19 DIAGNOSIS — M62830 Muscle spasm of back: Secondary | ICD-10-CM | POA: Diagnosis not present

## 2023-03-20 ENCOUNTER — Ambulatory Visit
Admission: RE | Admit: 2023-03-20 | Discharge: 2023-03-20 | Disposition: A | Discharge status: Home / Self Care | Payer: PPO | Source: Ambulatory Visit | Attending: Family Medicine | Admitting: Family Medicine

## 2023-03-20 DIAGNOSIS — Z1231 Encounter for screening mammogram for malignant neoplasm of breast: Secondary | ICD-10-CM

## 2023-04-02 DIAGNOSIS — M9903 Segmental and somatic dysfunction of lumbar region: Secondary | ICD-10-CM | POA: Diagnosis not present

## 2023-04-02 DIAGNOSIS — M62830 Muscle spasm of back: Secondary | ICD-10-CM | POA: Diagnosis not present

## 2023-04-02 DIAGNOSIS — M9901 Segmental and somatic dysfunction of cervical region: Secondary | ICD-10-CM | POA: Diagnosis not present

## 2023-04-02 DIAGNOSIS — M5416 Radiculopathy, lumbar region: Secondary | ICD-10-CM | POA: Diagnosis not present

## 2023-04-08 ENCOUNTER — Ambulatory Visit (HOSPITAL_BASED_OUTPATIENT_CLINIC_OR_DEPARTMENT_OTHER)
Admission: RE | Admit: 2023-04-08 | Discharge: 2023-04-08 | Disposition: A | Discharge status: Home / Self Care | Payer: PPO | Source: Ambulatory Visit | Attending: Oncology | Admitting: Oncology

## 2023-04-08 ENCOUNTER — Encounter (HOSPITAL_BASED_OUTPATIENT_CLINIC_OR_DEPARTMENT_OTHER): Payer: Self-pay

## 2023-04-08 ENCOUNTER — Inpatient Hospital Stay: Payer: PPO | Attending: Oncology

## 2023-04-08 DIAGNOSIS — R918 Other nonspecific abnormal finding of lung field: Secondary | ICD-10-CM | POA: Insufficient documentation

## 2023-04-08 DIAGNOSIS — Z7901 Long term (current) use of anticoagulants: Secondary | ICD-10-CM | POA: Diagnosis not present

## 2023-04-08 DIAGNOSIS — I4891 Unspecified atrial fibrillation: Secondary | ICD-10-CM | POA: Insufficient documentation

## 2023-04-08 DIAGNOSIS — C184 Malignant neoplasm of transverse colon: Secondary | ICD-10-CM | POA: Insufficient documentation

## 2023-04-08 DIAGNOSIS — C183 Malignant neoplasm of hepatic flexure: Secondary | ICD-10-CM | POA: Insufficient documentation

## 2023-04-08 DIAGNOSIS — F319 Bipolar disorder, unspecified: Secondary | ICD-10-CM | POA: Diagnosis not present

## 2023-04-08 DIAGNOSIS — J439 Emphysema, unspecified: Secondary | ICD-10-CM | POA: Insufficient documentation

## 2023-04-08 DIAGNOSIS — Z9049 Acquired absence of other specified parts of digestive tract: Secondary | ICD-10-CM | POA: Insufficient documentation

## 2023-04-08 DIAGNOSIS — I700 Atherosclerosis of aorta: Secondary | ICD-10-CM | POA: Insufficient documentation

## 2023-04-08 DIAGNOSIS — Z8719 Personal history of other diseases of the digestive system: Secondary | ICD-10-CM | POA: Insufficient documentation

## 2023-04-08 LAB — BASIC METABOLIC PANEL - CANCER CENTER ONLY
Anion gap: 7 (ref 5–15)
BUN: 26 mg/dL — ABNORMAL HIGH (ref 8–23)
CO2: 27 mmol/L (ref 22–32)
Calcium: 9.3 mg/dL (ref 8.9–10.3)
Chloride: 110 mmol/L (ref 98–111)
Creatinine: 1.12 mg/dL — ABNORMAL HIGH (ref 0.44–1.00)
GFR, Estimated: 49 mL/min — ABNORMAL LOW (ref >60)
Glucose, Bld: 103 mg/dL — ABNORMAL HIGH (ref 70–99)
Potassium: 4.3 mmol/L (ref 3.5–5.1)
Sodium: 144 mmol/L (ref 135–145)

## 2023-04-08 LAB — CEA (ACCESS): CEA (CHCC): 6.01 ng/mL — ABNORMAL HIGH (ref 0.00–5.00)

## 2023-04-08 MED ORDER — IOHEXOL 300 MG/ML  SOLN: 100.0000 mL | Freq: Once | INTRAMUSCULAR | Status: DC | PRN

## 2023-04-09 ENCOUNTER — Other Ambulatory Visit (HOSPITAL_BASED_OUTPATIENT_CLINIC_OR_DEPARTMENT_OTHER): Payer: PPO

## 2023-04-10 ENCOUNTER — Inpatient Hospital Stay: Payer: PPO | Admitting: Oncology

## 2023-04-10 ENCOUNTER — Ambulatory Visit (HOSPITAL_BASED_OUTPATIENT_CLINIC_OR_DEPARTMENT_OTHER)
Admission: RE | Admit: 2023-04-10 | Discharge: 2023-04-10 | Disposition: A | Discharge status: Home / Self Care | Payer: PPO | Source: Ambulatory Visit | Attending: Oncology | Admitting: Oncology

## 2023-04-10 VITALS — BP 151/49 | HR 69 | Temp 98.2°F | Resp 18 | Ht 62.0 in | Wt 133.8 lb

## 2023-04-10 DIAGNOSIS — C184 Malignant neoplasm of transverse colon: Secondary | ICD-10-CM | POA: Diagnosis not present

## 2023-04-10 DIAGNOSIS — C189 Malignant neoplasm of colon, unspecified: Secondary | ICD-10-CM | POA: Diagnosis not present

## 2023-04-10 DIAGNOSIS — C183 Malignant neoplasm of hepatic flexure: Secondary | ICD-10-CM | POA: Diagnosis not present

## 2023-04-10 MED ORDER — IOHEXOL 300 MG/ML  SOLN
100.0000 mL | Freq: Once | INTRAMUSCULAR | Status: AC | PRN
  Administered 2023-04-10: 80 mL via INTRAVENOUS

## 2023-04-10 NOTE — Progress Notes (Signed)
  [facility] OFFICE PROGRESS NOTE   Diagnosis: Colon cancer  INTERVAL HISTORY:   Kaitlyn Hoover returns as scheduled.  She feels well.  Good appetite.  She has a remote history of smoking, but does not smoke at present.  Multiple attempts at IV placement and radiology were unsuccessful yesterday.  She was able to have IV contrast with CTs earlier today.  She developed swelling at the left upper arm following the procedure.  Objective:  Vital signs in last 24 hours:  Blood pressure (!) 151/49, pulse 69, temperature 98.2 F (36.8 C), temperature source Oral, resp. rate 18, height 5\' 2"  (1.575 m), weight 133 lb 12.8 oz (60.7 kg), SpO2 98 %.    Lymphatics: No cervical, supraclavicular, axillary, or inguinal nodes Resp: Lungs clear bilaterally Cardio: Regular rate and rhythm GI: Nontender, no mass, no hepatosplenomegaly Vascular: No leg edema, soft tissue swelling proximal to the olecranon at the medial left arm-no erythema or tenderness   Lab Results:  Lab Results  Component Value Date   WBC 6.4 03/14/2022   HGB 12.4 03/14/2022   HCT 36.9 03/14/2022   MCV 107.3 (H) 03/14/2022   PLT 217 03/14/2022   NEUTROABS 4.5 03/14/2022    CMP  Lab Results  Component Value Date   NA 144 04/08/2023   K 4.3 04/08/2023   CL 110 04/08/2023   CO2 27 04/08/2023   GLUCOSE 103 (H) 04/08/2023   BUN 26 (H) 04/08/2023   CREATININE 1.12 (H) 04/08/2023   CALCIUM 9.3 04/08/2023   PROT 7.1 03/14/2022   ALBUMIN 4.2 03/14/2022   AST 23 03/14/2022   ALT 10 03/14/2022   ALKPHOS 72 03/14/2022   BILITOT 0.9 03/14/2022   GFRNONAA 49 (L) 04/08/2023   GFRAA 53 (L) 11/02/2020    Lab Results  Component Value Date   CEA1 5.4 (H) 08/06/2021   CEA 6.01 (H) 04/08/2023   Medications: I have reviewed the patient's current medications.   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 Tumor invades through the serosa, 3/25  lymph nodes, MSS, no loss of mismatch repair protein expression CTs 06/26/2021-eccentric wall thickening of the hepatic flexure, tiny pulmonary nodules measuring up to 2 mm-likely infectious or inflammatory Elevated preoperative CEA Cycle 1 Xeloda 09/10/2021 Cycle 2 Xeloda 10/01/2021 Cycle 3 Xeloda 10/22/2021 Cycle 4 Xeloda 11/12/2021-last dose 11/16/2021, hospitalized in [place] 11/18/2021 with small bowel obstruction Cycle 5 Xeloda 12/17/2021 Cycle 6 Xeloda 01/07/2022 Cycle 7 Xeloda 01/28/2022 Cycle 8 Xeloda 02/18/2022 Mild elevation of the CEA CTs 03/19/2022-no evidence of metastatic disease, unchanged tiny pulmonary nodules-likely benign Colonoscopy 09/03/2022-negative History of colon polyps including on the right colectomy specimen 08/15/2021, negative colonoscopy 09/03/2022 Bipolar disease Atrial fibrillation maintained on anticoagulation Subcutaneous lesion right lower chest inferior to the breast-possibly a lipoma Admission to the hospital in [place] 11/18/2021 with nausea/vomiting/diarrhea.  Diagnosed with small bowel obstruction.  Discharged home 11/25/2021. Small bilateral posterior cervical nodes on exam 01/03/2023     Disposition: Kaitlyn Hoover appears stable.  She has chronic mild elevation of the CEA, likely a benign normal variant.  The CEA is lower over the past year.  We discussed obtaining a circulating tumor DNA test.  She does not want to have this test.  She will return for an office visit and CEA in 3 months.  We will follow-up on results from the surveillance imaging earlier today.  Thornton Papas, MD  04/10/2023  12:29 PM

## 2023-04-11 ENCOUNTER — Telehealth: Payer: Self-pay | Admitting: *Deleted

## 2023-04-11 DIAGNOSIS — Z1283 Encounter for screening for malignant neoplasm of skin: Secondary | ICD-10-CM | POA: Diagnosis not present

## 2023-04-11 DIAGNOSIS — D485 Neoplasm of uncertain behavior of skin: Secondary | ICD-10-CM | POA: Diagnosis not present

## 2023-04-11 DIAGNOSIS — D225 Melanocytic nevi of trunk: Secondary | ICD-10-CM | POA: Diagnosis not present

## 2023-04-11 NOTE — Telephone Encounter (Signed)
Notified patient of CT results. Some pulmonary  nodules that are not suspicious. Otherwise negative.

## 2023-04-16 DIAGNOSIS — M9903 Segmental and somatic dysfunction of lumbar region: Secondary | ICD-10-CM | POA: Diagnosis not present

## 2023-04-16 DIAGNOSIS — M62830 Muscle spasm of back: Secondary | ICD-10-CM | POA: Diagnosis not present

## 2023-04-16 DIAGNOSIS — M5416 Radiculopathy, lumbar region: Secondary | ICD-10-CM | POA: Diagnosis not present

## 2023-04-16 DIAGNOSIS — M9901 Segmental and somatic dysfunction of cervical region: Secondary | ICD-10-CM | POA: Diagnosis not present

## 2023-04-23 DIAGNOSIS — M62830 Muscle spasm of back: Secondary | ICD-10-CM | POA: Diagnosis not present

## 2023-04-23 DIAGNOSIS — M9901 Segmental and somatic dysfunction of cervical region: Secondary | ICD-10-CM | POA: Diagnosis not present

## 2023-04-23 DIAGNOSIS — M9903 Segmental and somatic dysfunction of lumbar region: Secondary | ICD-10-CM | POA: Diagnosis not present

## 2023-04-23 DIAGNOSIS — M5416 Radiculopathy, lumbar region: Secondary | ICD-10-CM | POA: Diagnosis not present

## 2023-04-28 ENCOUNTER — Other Ambulatory Visit: Payer: Self-pay | Admitting: Cardiology

## 2023-04-29 NOTE — Telephone Encounter (Signed)
Prescription refill request for Eliquis received. Indication:afib Last office visit:12/23 Scr:1.12  5/24 Age: 82 Weight:60.7  kg  Prescription refilled

## 2023-04-30 DIAGNOSIS — N390 Urinary tract infection, site not specified: Secondary | ICD-10-CM | POA: Diagnosis not present

## 2023-04-30 DIAGNOSIS — R350 Frequency of micturition: Secondary | ICD-10-CM | POA: Diagnosis not present

## 2023-04-30 DIAGNOSIS — M5416 Radiculopathy, lumbar region: Secondary | ICD-10-CM | POA: Diagnosis not present

## 2023-04-30 DIAGNOSIS — M9901 Segmental and somatic dysfunction of cervical region: Secondary | ICD-10-CM | POA: Diagnosis not present

## 2023-04-30 DIAGNOSIS — M62830 Muscle spasm of back: Secondary | ICD-10-CM | POA: Diagnosis not present

## 2023-04-30 DIAGNOSIS — M9903 Segmental and somatic dysfunction of lumbar region: Secondary | ICD-10-CM | POA: Diagnosis not present

## 2023-05-02 ENCOUNTER — Telehealth: Payer: Self-pay | Admitting: Psychiatry

## 2023-05-02 ENCOUNTER — Other Ambulatory Visit: Payer: Self-pay | Admitting: Psychiatry

## 2023-05-02 DIAGNOSIS — F5105 Insomnia due to other mental disorder: Secondary | ICD-10-CM

## 2023-05-02 MED ORDER — TEMAZEPAM 7.5 MG PO CAPS: 7.5000 mg | ORAL_CAPSULE | Freq: Every day | ORAL | 0 refills | Status: DC

## 2023-05-02 NOTE — Telephone Encounter (Signed)
sent 

## 2023-05-02 NOTE — Telephone Encounter (Signed)
Pharmacy called and said the patient needs an early refill on her temazepam 7.5 mg. She is leaving early Monday morning to go out of town. Refill isn't due to be filled until Monday. Pharmacy needs a call from [place] saying it is ok to fill early

## 2023-05-12 DIAGNOSIS — D485 Neoplasm of uncertain behavior of skin: Secondary | ICD-10-CM | POA: Diagnosis not present

## 2023-05-12 DIAGNOSIS — L988 Other specified disorders of the skin and subcutaneous tissue: Secondary | ICD-10-CM | POA: Diagnosis not present

## 2023-05-14 DIAGNOSIS — M5416 Radiculopathy, lumbar region: Secondary | ICD-10-CM | POA: Diagnosis not present

## 2023-05-14 DIAGNOSIS — M9901 Segmental and somatic dysfunction of cervical region: Secondary | ICD-10-CM | POA: Diagnosis not present

## 2023-05-14 DIAGNOSIS — M9903 Segmental and somatic dysfunction of lumbar region: Secondary | ICD-10-CM | POA: Diagnosis not present

## 2023-05-14 DIAGNOSIS — M62830 Muscle spasm of back: Secondary | ICD-10-CM | POA: Diagnosis not present

## 2023-05-21 DIAGNOSIS — M5416 Radiculopathy, lumbar region: Secondary | ICD-10-CM | POA: Diagnosis not present

## 2023-05-21 DIAGNOSIS — M62830 Muscle spasm of back: Secondary | ICD-10-CM | POA: Diagnosis not present

## 2023-05-21 DIAGNOSIS — M9901 Segmental and somatic dysfunction of cervical region: Secondary | ICD-10-CM | POA: Diagnosis not present

## 2023-05-21 DIAGNOSIS — M9903 Segmental and somatic dysfunction of lumbar region: Secondary | ICD-10-CM | POA: Diagnosis not present

## 2023-05-28 DIAGNOSIS — M9901 Segmental and somatic dysfunction of cervical region: Secondary | ICD-10-CM | POA: Diagnosis not present

## 2023-05-28 DIAGNOSIS — M9903 Segmental and somatic dysfunction of lumbar region: Secondary | ICD-10-CM | POA: Diagnosis not present

## 2023-05-28 DIAGNOSIS — M5416 Radiculopathy, lumbar region: Secondary | ICD-10-CM | POA: Diagnosis not present

## 2023-05-28 DIAGNOSIS — M62830 Muscle spasm of back: Secondary | ICD-10-CM | POA: Diagnosis not present

## 2023-06-04 DIAGNOSIS — M9903 Segmental and somatic dysfunction of lumbar region: Secondary | ICD-10-CM | POA: Diagnosis not present

## 2023-06-04 DIAGNOSIS — M9901 Segmental and somatic dysfunction of cervical region: Secondary | ICD-10-CM | POA: Diagnosis not present

## 2023-06-04 DIAGNOSIS — M62830 Muscle spasm of back: Secondary | ICD-10-CM | POA: Diagnosis not present

## 2023-06-04 DIAGNOSIS — M5416 Radiculopathy, lumbar region: Secondary | ICD-10-CM | POA: Diagnosis not present

## 2023-06-11 DIAGNOSIS — M5416 Radiculopathy, lumbar region: Secondary | ICD-10-CM | POA: Diagnosis not present

## 2023-06-11 DIAGNOSIS — M9901 Segmental and somatic dysfunction of cervical region: Secondary | ICD-10-CM | POA: Diagnosis not present

## 2023-06-11 DIAGNOSIS — M62830 Muscle spasm of back: Secondary | ICD-10-CM | POA: Diagnosis not present

## 2023-06-11 DIAGNOSIS — M9903 Segmental and somatic dysfunction of lumbar region: Secondary | ICD-10-CM | POA: Diagnosis not present

## 2023-06-18 DIAGNOSIS — M9903 Segmental and somatic dysfunction of lumbar region: Secondary | ICD-10-CM | POA: Diagnosis not present

## 2023-06-18 DIAGNOSIS — M5416 Radiculopathy, lumbar region: Secondary | ICD-10-CM | POA: Diagnosis not present

## 2023-06-18 DIAGNOSIS — M9901 Segmental and somatic dysfunction of cervical region: Secondary | ICD-10-CM | POA: Diagnosis not present

## 2023-06-18 DIAGNOSIS — M62830 Muscle spasm of back: Secondary | ICD-10-CM | POA: Diagnosis not present

## 2023-07-02 DIAGNOSIS — M9901 Segmental and somatic dysfunction of cervical region: Secondary | ICD-10-CM | POA: Diagnosis not present

## 2023-07-02 DIAGNOSIS — M62830 Muscle spasm of back: Secondary | ICD-10-CM | POA: Diagnosis not present

## 2023-07-02 DIAGNOSIS — M5416 Radiculopathy, lumbar region: Secondary | ICD-10-CM | POA: Diagnosis not present

## 2023-07-02 DIAGNOSIS — M9903 Segmental and somatic dysfunction of lumbar region: Secondary | ICD-10-CM | POA: Diagnosis not present

## 2023-07-03 ENCOUNTER — Other Ambulatory Visit: Payer: Self-pay | Admitting: Psychiatry

## 2023-07-03 DIAGNOSIS — F5105 Insomnia due to other mental disorder: Secondary | ICD-10-CM

## 2023-07-11 ENCOUNTER — Inpatient Hospital Stay: Payer: PPO | Admitting: Nurse Practitioner

## 2023-07-11 ENCOUNTER — Inpatient Hospital Stay: Payer: PPO

## 2023-07-18 ENCOUNTER — Encounter: Payer: Self-pay | Admitting: Nurse Practitioner

## 2023-07-18 ENCOUNTER — Inpatient Hospital Stay: Payer: PPO | Admitting: Nurse Practitioner

## 2023-07-18 ENCOUNTER — Inpatient Hospital Stay: Payer: PPO | Attending: Nurse Practitioner

## 2023-07-18 VITALS — BP 139/60 | HR 85 | Temp 98.1°F | Resp 18 | Ht 62.0 in | Wt 140.0 lb

## 2023-07-18 DIAGNOSIS — C183 Malignant neoplasm of hepatic flexure: Secondary | ICD-10-CM | POA: Insufficient documentation

## 2023-07-18 DIAGNOSIS — C184 Malignant neoplasm of transverse colon: Secondary | ICD-10-CM | POA: Diagnosis not present

## 2023-07-18 DIAGNOSIS — Z9049 Acquired absence of other specified parts of digestive tract: Secondary | ICD-10-CM | POA: Insufficient documentation

## 2023-07-18 LAB — CEA (ACCESS): CEA (CHCC): 6.84 ng/mL — ABNORMAL HIGH (ref 0.00–5.00)

## 2023-07-18 NOTE — Progress Notes (Signed)
  [facility] OFFICE PROGRESS NOTE   Diagnosis: Colon cancer  INTERVAL HISTORY:   Kaitlyn Hoover returns for follow-up.  She had nausea and diarrhea last week.  Symptoms have resolved.  Bowels moving regularly.  No bleeding.  No abdominal pain.  She has a good appetite.  She is gaining weight.  Objective:  Vital signs in last 24 hours:  Blood pressure 139/60, pulse 85, temperature 98.1 F (36.7 C), temperature source Oral, resp. rate 18, height 5\' 2"  (1.575 m), weight 140 lb (63.5 kg), SpO2 98%.    Lymphatics: No palpable cervical, supraclavicular, axillary or inguinal lymph nodes. Resp: Lungs clear bilaterally. Cardio: Regular rate and rhythm. GI: No hepatosplenomegaly.  No mass. Vascular: No leg edema.   Lab Results:  Lab Results  Component Value Date   WBC 6.4 03/14/2022   HGB 12.4 03/14/2022   HCT 36.9 03/14/2022   MCV 107.3 (H) 03/14/2022   PLT 217 03/14/2022   NEUTROABS 4.5 03/14/2022    Imaging:  No results found.  Medications: I have reviewed the patient's current medications.  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 Tumor invades through the serosa, 3/25 lymph nodes, MSS, no loss of mismatch repair protein expression CTs 06/26/2021-eccentric wall thickening of the hepatic flexure, tiny pulmonary nodules measuring up to 2 mm-likely infectious or inflammatory Elevated preoperative CEA Cycle 1 Xeloda 09/10/2021 Cycle 2 Xeloda 10/01/2021 Cycle 3 Xeloda 10/22/2021 Cycle 4 Xeloda 11/12/2021-last dose 11/16/2021, hospitalized in [place] 11/18/2021 with small bowel obstruction Cycle 5 Xeloda 12/17/2021 Cycle 6 Xeloda 01/07/2022 Cycle 7 Xeloda 01/28/2022 Cycle 8 Xeloda 02/18/2022 Mild elevation of the CEA CTs 03/19/2022-no evidence of metastatic disease, unchanged tiny pulmonary nodules-likely benign Colonoscopy 09/03/2022-negative CTs 04/10/2023-no evidence of metastatic disease; again  noted small scattered subpleural nodules lungs bilaterally, upper lobe predominant, measuring 2 to 3 mm, not considered suspicious for metastatic disease.  No new/suspicious pulmonary nodules. History of colon polyps including on the right colectomy specimen 08/15/2021, negative colonoscopy 09/03/2022 Bipolar disease Atrial fibrillation maintained on anticoagulation Subcutaneous lesion right lower chest inferior to the breast-possibly a lipoma Admission to the hospital in [place] 11/18/2021 with nausea/vomiting/diarrhea.  Diagnosed with small bowel obstruction.  Discharged home 11/25/2021. Small bilateral posterior cervical nodes on exam 01/03/2023  Disposition: Kaitlyn Hoover remains in clinical remission from colon cancer.  She has had chronic mild elevation of the CEA in the past.  We will follow-up on the CEA from today.  She will return for a CEA and follow-up visit in 3 to 4 months.  Next CTs May 2025.    Lonna Cobb ANP/GNP-BC   07/18/2023  2:24 PM

## 2023-07-19 ENCOUNTER — Other Ambulatory Visit: Payer: Self-pay | Admitting: Cardiology

## 2023-07-22 ENCOUNTER — Telehealth: Payer: Self-pay

## 2023-07-22 NOTE — Telephone Encounter (Signed)
Patient gave verbal understanding and had no further questions or concerns  

## 2023-07-22 NOTE — Telephone Encounter (Signed)
-----   Message from Thornton Papas sent at 07/21/2023  1:17 PM EDT ----- Please call patient, CEA is stable, f/u as scheduled

## 2023-07-23 DIAGNOSIS — H6123 Impacted cerumen, bilateral: Secondary | ICD-10-CM | POA: Diagnosis not present

## 2023-07-23 DIAGNOSIS — M9903 Segmental and somatic dysfunction of lumbar region: Secondary | ICD-10-CM | POA: Diagnosis not present

## 2023-07-23 DIAGNOSIS — M5416 Radiculopathy, lumbar region: Secondary | ICD-10-CM | POA: Diagnosis not present

## 2023-07-23 DIAGNOSIS — M9901 Segmental and somatic dysfunction of cervical region: Secondary | ICD-10-CM | POA: Diagnosis not present

## 2023-07-23 DIAGNOSIS — M62830 Muscle spasm of back: Secondary | ICD-10-CM | POA: Diagnosis not present

## 2023-07-25 DIAGNOSIS — H26491 Other secondary cataract, right eye: Secondary | ICD-10-CM | POA: Diagnosis not present

## 2023-07-30 DIAGNOSIS — M9903 Segmental and somatic dysfunction of lumbar region: Secondary | ICD-10-CM | POA: Diagnosis not present

## 2023-07-30 DIAGNOSIS — M9901 Segmental and somatic dysfunction of cervical region: Secondary | ICD-10-CM | POA: Diagnosis not present

## 2023-07-30 DIAGNOSIS — M5416 Radiculopathy, lumbar region: Secondary | ICD-10-CM | POA: Diagnosis not present

## 2023-07-30 DIAGNOSIS — M62830 Muscle spasm of back: Secondary | ICD-10-CM | POA: Diagnosis not present

## 2023-08-05 ENCOUNTER — Ambulatory Visit: Payer: PPO | Attending: Cardiology | Admitting: Cardiology

## 2023-08-05 ENCOUNTER — Encounter: Payer: Self-pay | Admitting: Cardiology

## 2023-08-05 ENCOUNTER — Encounter: Payer: Self-pay | Admitting: *Deleted

## 2023-08-05 VITALS — BP 138/70 | HR 75 | Ht 62.0 in | Wt 140.8 lb

## 2023-08-05 DIAGNOSIS — I480 Paroxysmal atrial fibrillation: Secondary | ICD-10-CM

## 2023-08-05 DIAGNOSIS — Z01812 Encounter for preprocedural laboratory examination: Secondary | ICD-10-CM | POA: Diagnosis not present

## 2023-08-05 NOTE — Patient Instructions (Addendum)
Medication Instructions:  Your physician recommends that you continue on your current medications as directed. Please refer to the Current Medication list given to you today.  *If you need a refill on your cardiac medications before your next appointment, please call your pharmacy*   Lab Work: Today: BMET & CBC If you have labs (blood work) drawn today and your tests are completely normal, you will receive your results only by: MyChart Message (if you have MyChart) OR A paper copy in the mail If you have any lab test that is abnormal or we need to change your treatment, we will call you to review the results.   Testing/Procedures: Your physician has recommended that you have a Cardioversion (DCCV). Electrical Cardioversion uses a jolt of electricity to your heart either through paddles or wired patches attached to your chest. This is a controlled, usually prescheduled, procedure. Defibrillation is done under light anesthesia in the hospital, and you usually go home the day of the procedure. This is done to get your heart back into a normal rhythm. You are not awake for the procedure. Please see the instruction sheet given to you today.   Follow-Up: At [facility], you and your health needs are our priority.  As part of our continuing mission to provide you with exceptional heart care, we have created designated Provider Care Teams.  These Care Teams include your primary Cardiologist (physician) and Advanced Practice Providers (APPs -  Physician Assistants and Nurse Practitioners) who all work together to provide you with the care you need, when you need it.   Your next appointment:   2  week(s) after your cardioversion  The format for your next appointment:   In Person  Provider:   You will follow up in the Atrial Fibrillation Clinic located at [facility]. Your provider will be: Clint R. Fenton, PA-C{ Or  Landry Mellow, PA-C   Thank you for choosing [facility]!!   Dory Horn, RN [phone]

## 2023-08-05 NOTE — Progress Notes (Signed)
Electrophysiology Office Note:   Date:  08/05/2023  ID:  Kaitlyn Hoover, DOB [birth year], MRN [id number]  Primary Cardiologist: None Electrophysiologist: Thurlow Gallaga Jorja Loa, MD      History of Present Illness:   Kaitlyn Hoover is a 82 y.o. female with h/o atrial fibrillation, AVNRT seen today for routine electrophysiology followup.   Since last being seen in our clinic the patient reports doing well.  She has no chest pain or shortness of breath.  She has no fatigue.  She is able to do all of her daily activities.  She is in atrial flutter today which is a surprise to her.  She has no acute complaints.  she denies chest pain, palpitations, dyspnea, PND, orthopnea, nausea, vomiting, dizziness, syncope, edema, weight gain, or early satiety.   Review of systems complete and found to be negative unless listed in HPI.   EP Information / Studies Reviewed:    EKG is ordered today. Personal review as below.  EKG Interpretation Date/Time:  Tuesday August 05 2023 14:48:11 EDT Ventricular Rate:  75 PR Interval:    QRS Duration:  78 QT Interval:  358 QTC Calculation: 399 R Axis:   69  Text Interpretation: Atrial flutter with variable A-V block When compared with ECG of 09-Nov-2021 10:47, Atrial flutter has replaced Sinus rhythm Confirmed by Maxima Skelton ([id number]) on 08/05/2023 2:52:30 PM     Risk Assessment/Calculations:    CHA2DS2-VASc Score = 5   This indicates a 7.2% annual risk of stroke. The patient's score is based upon: CHF History: 0 HTN History: 1 Diabetes History: 0 Stroke History: 0 Vascular Disease History: 1 Age Score: 2 Gender Score: 1             Physical Exam:   VS:  BP 138/70   Pulse 75   Ht 5\' 2"  (1.575 m)   Wt 140 lb 12.8 oz (63.9 kg)   LMP  (LMP Unknown)   SpO2 98%   BMI 25.75 kg/m    Wt Readings from Last 3 Encounters:  08/05/23 140 lb 12.8 oz (63.9 kg)  07/18/23 140 lb (63.5 kg)  04/10/23 133 lb 12.8 oz (60.7 kg)     GEN: Well nourished,  well developed in no acute distress NECK: No JVD; No carotid bruits CARDIAC: Irregularly irregular rate and rhythm, no murmurs, rubs, gallops RESPIRATORY:  Clear to auscultation without rales, wheezing or rhonchi  ABDOMEN: Soft, non-tender, non-distended EXTREMITIES:  No edema; No deformity   ASSESSMENT AND PLAN:    1.  Paroxysmal atrial fibrillation: Post ablation 03/25/2017 without recurrence.  Anticoagulation has been held as she has not had further episodes.  She is in atrial flutter.  She feels well currently, but has no fatigue.  She would like to see if she feels better normal rhythm.  Bena Kobel plan for cardioversion.  2.  Hyperlipidemia: Continue plan per primary physician  3.  Coronary artery disease: No current chest pain.  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  No obvious recurrence  5.  Hypertension: Currently well-controlled  Follow up with Afib Clinic as usual post procedure  Signed, [name] Jorja Loa, MD

## 2023-08-05 NOTE — H&P (View-Only) (Signed)
Electrophysiology Office Note:   Date:  08/05/2023  ID:  Kaitlyn Hoover, DOB [birth year], MRN [id number]  Primary Cardiologist: None Electrophysiologist: Thurlow Gallaga Jorja Loa, MD      History of Present Illness:   Kaitlyn Hoover is a 82 y.o. female with h/o atrial fibrillation, AVNRT seen today for routine electrophysiology followup.   Since last being seen in our clinic the patient reports doing well.  She has no chest pain or shortness of breath.  She has no fatigue.  She is able to do all of her daily activities.  She is in atrial flutter today which is a surprise to her.  She has no acute complaints.  she denies chest pain, palpitations, dyspnea, PND, orthopnea, nausea, vomiting, dizziness, syncope, edema, weight gain, or early satiety.   Review of systems complete and found to be negative unless listed in HPI.   EP Information / Studies Reviewed:    EKG is ordered today. Personal review as below.  EKG Interpretation Date/Time:  Tuesday August 05 2023 14:48:11 EDT Ventricular Rate:  75 PR Interval:    QRS Duration:  78 QT Interval:  358 QTC Calculation: 399 R Axis:   69  Text Interpretation: Atrial flutter with variable A-V block When compared with ECG of 09-Nov-2021 10:47, Atrial flutter has replaced Sinus rhythm Confirmed by Maxima Skelton ([id number]) on 08/05/2023 2:52:30 PM     Risk Assessment/Calculations:    CHA2DS2-VASc Score = 5   This indicates a 7.2% annual risk of stroke. The patient's score is based upon: CHF History: 0 HTN History: 1 Diabetes History: 0 Stroke History: 0 Vascular Disease History: 1 Age Score: 2 Gender Score: 1             Physical Exam:   VS:  BP 138/70   Pulse 75   Ht 5\' 2"  (1.575 m)   Wt 140 lb 12.8 oz (63.9 kg)   LMP  (LMP Unknown)   SpO2 98%   BMI 25.75 kg/m    Wt Readings from Last 3 Encounters:  08/05/23 140 lb 12.8 oz (63.9 kg)  07/18/23 140 lb (63.5 kg)  04/10/23 133 lb 12.8 oz (60.7 kg)     GEN: Well nourished,  well developed in no acute distress NECK: No JVD; No carotid bruits CARDIAC: Irregularly irregular rate and rhythm, no murmurs, rubs, gallops RESPIRATORY:  Clear to auscultation without rales, wheezing or rhonchi  ABDOMEN: Soft, non-tender, non-distended EXTREMITIES:  No edema; No deformity   ASSESSMENT AND PLAN:    1.  Paroxysmal atrial fibrillation: Post ablation 03/25/2017 without recurrence.  Anticoagulation has been held as she has not had further episodes.  She is in atrial flutter.  She feels well currently, but has no fatigue.  She would like to see if she feels better normal rhythm.  Bena Kobel plan for cardioversion.  2.  Hyperlipidemia: Continue plan per primary physician  3.  Coronary artery disease: No current chest pain.  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  No obvious recurrence  5.  Hypertension: Currently well-controlled  Follow up with Afib Clinic as usual post procedure  Signed, [name] Jorja Loa, MD

## 2023-08-06 ENCOUNTER — Ambulatory Visit (HOSPITAL_COMMUNITY): Payer: PPO | Admitting: Anesthesiology

## 2023-08-06 ENCOUNTER — Encounter (HOSPITAL_COMMUNITY): Payer: Self-pay | Admitting: Cardiovascular Disease

## 2023-08-06 ENCOUNTER — Encounter (HOSPITAL_COMMUNITY)
Admission: RE | Disposition: A | Discharge status: Home / Self Care | Payer: Self-pay | Source: Home / Self Care | Attending: Cardiovascular Disease

## 2023-08-06 ENCOUNTER — Ambulatory Visit (HOSPITAL_COMMUNITY)
Admission: RE | Admit: 2023-08-06 | Discharge: 2023-08-06 | Disposition: A | Discharge status: Home / Self Care | Payer: PPO | Attending: Cardiovascular Disease | Admitting: Cardiovascular Disease

## 2023-08-06 DIAGNOSIS — Z87891 Personal history of nicotine dependence: Secondary | ICD-10-CM

## 2023-08-06 DIAGNOSIS — E039 Hypothyroidism, unspecified: Secondary | ICD-10-CM | POA: Diagnosis not present

## 2023-08-06 DIAGNOSIS — Z7901 Long term (current) use of anticoagulants: Secondary | ICD-10-CM | POA: Insufficient documentation

## 2023-08-06 DIAGNOSIS — I4719 Other supraventricular tachycardia: Secondary | ICD-10-CM | POA: Diagnosis not present

## 2023-08-06 DIAGNOSIS — E785 Hyperlipidemia, unspecified: Secondary | ICD-10-CM | POA: Diagnosis not present

## 2023-08-06 DIAGNOSIS — Z79899 Other long term (current) drug therapy: Secondary | ICD-10-CM | POA: Diagnosis not present

## 2023-08-06 DIAGNOSIS — I480 Paroxysmal atrial fibrillation: Secondary | ICD-10-CM | POA: Insufficient documentation

## 2023-08-06 DIAGNOSIS — I2510 Atherosclerotic heart disease of native coronary artery without angina pectoris: Secondary | ICD-10-CM | POA: Diagnosis not present

## 2023-08-06 DIAGNOSIS — I4892 Unspecified atrial flutter: Secondary | ICD-10-CM | POA: Diagnosis not present

## 2023-08-06 DIAGNOSIS — I4891 Unspecified atrial fibrillation: Secondary | ICD-10-CM

## 2023-08-06 DIAGNOSIS — I10 Essential (primary) hypertension: Secondary | ICD-10-CM | POA: Insufficient documentation

## 2023-08-06 HISTORY — PX: CARDIOVERSION: SHX1299

## 2023-08-06 SURGERY — CARDIOVERSION
Anesthesia: General

## 2023-08-06 MED ORDER — LIDOCAINE 2% (20 MG/ML) 5 ML SYRINGE
INTRAMUSCULAR | Status: DC | PRN
  Administered 2023-08-06: 60 mg via INTRAVENOUS

## 2023-08-06 MED ORDER — SODIUM CHLORIDE 0.9 % IV SOLN: INTRAVENOUS | Status: DC

## 2023-08-06 MED ORDER — PROPOFOL 10 MG/ML IV BOLUS
INTRAVENOUS | Status: DC | PRN
  Administered 2023-08-06: 50 mg via INTRAVENOUS

## 2023-08-06 SURGICAL SUPPLY — 1 items: ELECT DEFIB PAD ADLT CADENCE (PAD) ×2 IMPLANT

## 2023-08-06 NOTE — Anesthesia Preprocedure Evaluation (Signed)
Anesthesia Evaluation  Patient identified by MRN, date of birth, ID band Patient awake    Reviewed: Allergy & Precautions, NPO status , Patient's Chart, lab work & pertinent test results  Airway Mallampati: II  TM Distance: >3 FB Neck ROM: Full    Dental  (+) Dental Advisory Given, Upper Dentures   Pulmonary former smoker   Pulmonary exam normal breath sounds clear to auscultation       Cardiovascular hypertension, + dysrhythmias Atrial Fibrillation  Rhythm:Irregular Rate:Abnormal     Neuro/Psych  PSYCHIATRIC DISORDERS Anxiety  Bipolar Disorder Schizophrenia   Neuromuscular disease    GI/Hepatic negative GI ROS, Neg liver ROS,,,  Endo/Other  Hypothyroidism    Renal/GU negative Renal ROS     Musculoskeletal negative musculoskeletal ROS (+)    Abdominal   Peds  Hematology  (+) Blood dyscrasia (Eliquis)   Anesthesia Other Findings A-FIB / AFLUTTER  Reproductive/Obstetrics                              Anesthesia Physical Anesthesia Plan  ASA: 3  Anesthesia Plan: General   Post-op Pain Management:    Induction: Intravenous  PONV Risk Score and Plan: 3 and TIVA  Airway Management Planned: Mask  Additional Equipment:   Intra-op Plan:   Post-operative Plan:   Informed Consent: I have reviewed the patients History and Physical, chart, labs and discussed the procedure including the risks, benefits and alternatives for the proposed anesthesia with the patient or authorized representative who has indicated his/her understanding and acceptance.     Dental advisory given  Plan Discussed with: CRNA  Anesthesia Plan Comments:         Anesthesia Quick Evaluation

## 2023-08-06 NOTE — CV Procedure (Signed)
DCC: Anesthesia:  Propofol On Rx Eliquis no missed doses  DCC x 1 150 J biphasic  Converted from afib rate 72 to NSR rate 60   No immediate neurologic sequelae  Charlton Haws MDF [facility]

## 2023-08-06 NOTE — Transfer of Care (Signed)
Immediate Anesthesia Transfer of Care Note  Patient: Kaitlyn Hoover  Procedure(s) Performed: CARDIOVERSION  Patient Location: Cath Lab  Anesthesia Type:General  Level of Consciousness: drowsy and responds to stimulation  Airway & Oxygen Therapy: Patient Spontanous Breathing and Patient connected to nasal cannula oxygen  Post-op Assessment: Report given to RN and Post -op Vital signs reviewed and stable  Post vital signs: Reviewed and stable  Last Vitals:  Vitals Value Taken Time  BP 140/54 08/06/23 1347  Temp 36.7 C 08/06/23 1347  Pulse 67 08/06/23 1347  Resp 16 08/06/23 1347  SpO2 95 % 08/06/23 1347         Complications: No notable events documented.

## 2023-08-06 NOTE — Interval H&P Note (Signed)
History and Physical Interval Note:  08/06/2023 1:17 PM  Kaitlyn Hoover  has presented today for surgery, with the diagnosis of AFIB / AFLUTTER.  The various methods of treatment have been discussed with the patient and family. After consideration of risks, benefits and other options for treatment, the patient has consented to  Procedure(s): CARDIOVERSION (N/A) as a surgical intervention.  The patient's history has been reviewed, patient examined, no change in status, stable for surgery.  I have reviewed the patient's chart and labs.  Questions were answered to the patient's satisfaction.     Charlton Haws

## 2023-08-07 ENCOUNTER — Encounter (HOSPITAL_COMMUNITY): Payer: Self-pay | Admitting: Cardiovascular Disease

## 2023-08-07 NOTE — Anesthesia Postprocedure Evaluation (Signed)
Anesthesia Post Note  Patient: Kaitlyn Hoover  Procedure(s) Performed: CARDIOVERSION     Patient location during evaluation: Cath Lab Anesthesia Type: General Level of consciousness: awake and alert Pain management: pain level controlled Vital Signs Assessment: post-procedure vital signs reviewed and stable Respiratory status: spontaneous breathing, nonlabored ventilation, respiratory function stable and patient connected to nasal cannula oxygen Cardiovascular status: blood pressure returned to baseline and stable Postop Assessment: no apparent nausea or vomiting Anesthetic complications: no   No notable events documented.  Last Vitals:  Vitals:   08/06/23 1400 08/06/23 1405  BP: 108/71 (!) 145/53  Pulse: (!) 55 (!) 57  Resp: 16 16  Temp:    SpO2: 95% 100%    Last Pain:  Vitals:   08/06/23 1405  TempSrc:   PainSc: 0-No pain   Pain Goal:                   Collene Schlichter

## 2023-08-13 DIAGNOSIS — M9901 Segmental and somatic dysfunction of cervical region: Secondary | ICD-10-CM | POA: Diagnosis not present

## 2023-08-13 DIAGNOSIS — M62830 Muscle spasm of back: Secondary | ICD-10-CM | POA: Diagnosis not present

## 2023-08-13 DIAGNOSIS — M9903 Segmental and somatic dysfunction of lumbar region: Secondary | ICD-10-CM | POA: Diagnosis not present

## 2023-08-13 DIAGNOSIS — M5416 Radiculopathy, lumbar region: Secondary | ICD-10-CM | POA: Diagnosis not present

## 2023-08-19 ENCOUNTER — Ambulatory Visit (HOSPITAL_COMMUNITY)
Admission: RE | Admit: 2023-08-19 | Discharge: 2023-08-19 | Disposition: A | Discharge status: Home / Self Care | Payer: PPO | Source: Ambulatory Visit | Attending: Internal Medicine | Admitting: Internal Medicine

## 2023-08-19 VITALS — BP 146/62 | HR 60 | Ht 62.0 in | Wt 141.4 lb

## 2023-08-19 DIAGNOSIS — I4892 Unspecified atrial flutter: Secondary | ICD-10-CM | POA: Diagnosis not present

## 2023-08-19 DIAGNOSIS — Z7901 Long term (current) use of anticoagulants: Secondary | ICD-10-CM | POA: Diagnosis not present

## 2023-08-19 DIAGNOSIS — D6869 Other thrombophilia: Secondary | ICD-10-CM | POA: Insufficient documentation

## 2023-08-19 DIAGNOSIS — I4891 Unspecified atrial fibrillation: Secondary | ICD-10-CM | POA: Diagnosis not present

## 2023-08-19 DIAGNOSIS — I480 Paroxysmal atrial fibrillation: Secondary | ICD-10-CM | POA: Insufficient documentation

## 2023-08-19 DIAGNOSIS — R9431 Abnormal electrocardiogram [ECG] [EKG]: Secondary | ICD-10-CM | POA: Diagnosis not present

## 2023-08-19 NOTE — Progress Notes (Signed)
Primary Care Physician: Farris Has, MD Primary Cardiologist: None Electrophysiologist: Will Jorja Loa, MD     Referring Physician: Dr. Jaci Lazier Kaitlyn Hoover is a 82 y.o. 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 Seen by Dr. Elberta Fortis on 9/17 and noted to be in atrial flutter. She is s/p successful DCCV on 08/06/23. Patient is on Eliquis 2.5 mg BID for a CHADS2VASC score of 5.  On evaluation today, she is currently in NSR. She appears to be maintaining NSR since cardioversion on 9/18. She feels well overall. No missed doses of Eliquis.   Today, she denies symptoms of palpitations, chest pain, shortness of breath, orthopnea, PND, lower extremity edema, dizziness, presyncope, syncope, snoring, daytime somnolence, bleeding, or neurologic sequela. The patient is tolerating medications without difficulties and is otherwise without complaint today.    she has a BMI of Body mass index is 25.86 kg/m.Marland Kitchen Filed Weights   08/19/23 1032  Weight: 64.1 kg    Current Outpatient Medications  Medication Sig Dispense Refill   acyclovir (ZOVIRAX) 400 MG tablet Take 400 mg at bedtime by mouth.      Ascorbic Acid (VITAMIN C) 1000 MG tablet Take 1,000 mg by mouth daily.     azelastine (OPTIVAR) 0.05 % ophthalmic solution Place 2 drops into both eyes daily as needed. Inching eyes     b complex vitamins capsule Take 1 capsule by mouth daily.     Calcium Citrate-Vitamin D (CALCIUM CITRATE + D PO) Take 1,200 mg by mouth at bedtime.     cholecalciferol (VITAMIN D) 25 MCG (1000 UNIT) tablet Take 1,000 Units by mouth daily.     Coenzyme Q10 (CO Q-10) 100 MG CAPS Take 100 mg by mouth daily.      diltiazem (CARDIZEM CD) 180 MG 24 hr capsule Take 180 mg by mouth daily.     donepezil (ARICEPT) 10 MG tablet  Take 1 tablet (10 mg total) by mouth at bedtime. 90 tablet 3   ELIQUIS 2.5 MG TABS tablet TAKE 1 TABLET BY MOUTH TWICE A DAY 60 tablet 5   ferrous sulfate 324 MG TBEC Take 324 mg by mouth daily with breakfast.     fluticasone (FLONASE) 50 MCG/ACT nasal spray Place 2 sprays into both nostrils daily.     Ginkgo Biloba 120 MG TABS Take 240 mg by mouth daily.     iloperidone (FANAPT) 4 MG TABS tablet Take 1 tablet (4 mg total) by mouth at bedtime. (Patient taking differently: Take 1 mg by mouth at bedtime.) 30 tablet 3   lamoTRIgine (LAMICTAL) 100 MG tablet Take 1 tablet (100 mg total) by mouth daily. 90 tablet 3   levothyroxine (SYNTHROID, LEVOTHROID) 100 MCG tablet Take 100 mcg by mouth daily before breakfast.      MAGNESIUM PO Take 250 mg by mouth every evening.     Melatonin 3 MG TABS Take 3 mg by mouth at bedtime.     Omega-3 Fatty Acids (FISH OIL BURP-LESS) 1000 MG CAPS Take 3,000 mg by mouth 2 (two) times daily.     oxybutynin (DITROPAN XL) 15 MG 24 hr tablet Take 15 mg by mouth daily.      rosuvastatin (CRESTOR) 10 MG tablet TAKE 1 TABLET BY MOUTH UP TO 4 TIMES A WEEK ON SUNDAY, MONDAY,WEDNESDAY AND FRIDAY 48 tablet 0  sodium chloride (OCEAN) 0.65 % SOLN nasal spray Place 2 sprays into both nostrils every evening.     temazepam (RESTORIL) 7.5 MG capsule TAKE 1 CAPSULE (7.5 MG TOTAL) BY MOUTH AT BEDTIME. 30 capsule 3   traMADol (ULTRAM) 50 MG tablet Take 1 tablet (50 mg total) by mouth 3 (three) times daily as needed. (Patient taking differently: Take 50 mg by mouth as needed.) 90 tablet 3   Turmeric 500 MG CAPS Take 500 mg by mouth daily.     vitamin B-12 (CYANOCOBALAMIN) 1000 MCG tablet Take 1,000 mcg by mouth daily.     ziprasidone (GEODON) 40 MG capsule Take 1 capsule (40 mg total) by mouth at bedtime. 90 capsule 3   No current facility-administered medications for this encounter.    Atrial Fibrillation Management history:  Previous antiarrhythmic drugs:  Previous cardioversions:  2018, 08/06/23 Previous ablations: Afib 2018, SVT 2019 Anticoagulation history: Eliquis   ROS- All systems are reviewed and negative except as per the HPI above.  Physical Exam: BP (!) 146/62   Pulse 60   Ht 5\' 2"  (1.575 m)   Wt 64.1 kg   LMP  (LMP Unknown)   BMI 25.86 kg/m   GEN: Well nourished, well developed in no acute distress NECK: No JVD; No carotid bruits CARDIAC: Regular rate and rhythm, no murmurs, rubs, gallops RESPIRATORY:  Clear to auscultation without rales, wheezing or rhonchi  ABDOMEN: Soft, non-tender, non-distended EXTREMITIES:  No edema; No deformity   EKG today demonstrates  Vent. rate 60 BPM PR interval 162 ms QRS duration 72 ms QT/QTcB 434/434 ms P-R-T axes 84 36 96 Normal sinus rhythm Nonspecific T wave abnormality Abnormal ECG When compared with ECG of 06-Aug-2023 13:49, PREVIOUS ECG IS PRESENT  Echo 03/2017 demonstrated  Study Conclusions   - Left ventricle: The cavity size was normal. Wall thickness was    normal. Systolic function was normal. The estimated ejection    fraction was in the range of 60% to 65%. Wall motion was normal;    there were no regional wall motion abnormalities. Features are    consistent with a pseudonormal left ventricular filling pattern,    with concomitant abnormal relaxation and increased filling    pressure (grade 2 diastolic dysfunction).  - Aortic valve: There was no stenosis.  - Mitral valve: Mildly calcified annulus. There was trivial    regurgitation.  - Left atrium: The atrium was moderately dilated.  - Right ventricle: The cavity size was normal. Systolic function    was normal.  - Tricuspid valve: Peak RV-RA gradient (S): 28 mm Hg.  - Pulmonary arteries: PA peak pressure: 31 mm Hg (S).  - Inferior vena cava: The vessel was normal in size. The    respirophasic diameter changes were in the normal range (>= 50%),    consistent with normal central venous pressure.  - Pericardium, extracardiac: A trivial  pericardial effusion was    identified.   ASSESSMENT & PLAN CHA2DS2-VASc Score = 5  The patient's score is based upon: CHF History: 0 HTN History: 1 Diabetes History: 0 Stroke History: 0 Vascular Disease History: 1 Age Score: 2 Gender Score: 1       ASSESSMENT AND PLAN: Paroxysmal Atrial Fibrillation (ICD10:  I48.0) / Atrial flutter The patient's CHA2DS2-VASc score is 5, indicating a 7.2% annual risk of stroke.   S/p successful DCCV on 08/06/23.   She is currently in NSR.  Continue diltiazem 180 mg daily.   Going forward, if she has recurrence  would be potential candidate for repeat ablation or Tikosyn 125 mcg (CrCl 37 mL/min from [facility] 08/05/23; Qtc 444 ms) or amiodarone.    Secondary Hypercoagulable State (ICD10:  D68.69) The patient is at significant risk for stroke/thromboembolism based upon her CHA2DS2-VASc Score of 5.  Continue Apixaban (Eliquis).  No missed doses.    Follow up as scheduled with Dr. Elberta Fortis.    Lake Bells, PA-C  [facility] [address] [place], [place] [address] [phone]

## 2023-08-20 DIAGNOSIS — M9903 Segmental and somatic dysfunction of lumbar region: Secondary | ICD-10-CM | POA: Diagnosis not present

## 2023-08-20 DIAGNOSIS — M5416 Radiculopathy, lumbar region: Secondary | ICD-10-CM | POA: Diagnosis not present

## 2023-08-20 DIAGNOSIS — M62830 Muscle spasm of back: Secondary | ICD-10-CM | POA: Diagnosis not present

## 2023-08-20 DIAGNOSIS — M9901 Segmental and somatic dysfunction of cervical region: Secondary | ICD-10-CM | POA: Diagnosis not present

## 2023-08-25 DIAGNOSIS — H26491 Other secondary cataract, right eye: Secondary | ICD-10-CM | POA: Diagnosis not present

## 2023-08-27 DIAGNOSIS — M9903 Segmental and somatic dysfunction of lumbar region: Secondary | ICD-10-CM | POA: Diagnosis not present

## 2023-08-27 DIAGNOSIS — M62830 Muscle spasm of back: Secondary | ICD-10-CM | POA: Diagnosis not present

## 2023-08-27 DIAGNOSIS — M5416 Radiculopathy, lumbar region: Secondary | ICD-10-CM | POA: Diagnosis not present

## 2023-08-27 DIAGNOSIS — M9901 Segmental and somatic dysfunction of cervical region: Secondary | ICD-10-CM | POA: Diagnosis not present

## 2023-09-01 NOTE — Telephone Encounter (Signed)
error 

## 2023-09-03 DIAGNOSIS — M9903 Segmental and somatic dysfunction of lumbar region: Secondary | ICD-10-CM | POA: Diagnosis not present

## 2023-09-03 DIAGNOSIS — M9901 Segmental and somatic dysfunction of cervical region: Secondary | ICD-10-CM | POA: Diagnosis not present

## 2023-09-03 DIAGNOSIS — M62830 Muscle spasm of back: Secondary | ICD-10-CM | POA: Diagnosis not present

## 2023-09-03 DIAGNOSIS — M5416 Radiculopathy, lumbar region: Secondary | ICD-10-CM | POA: Diagnosis not present

## 2023-09-09 DIAGNOSIS — I480 Paroxysmal atrial fibrillation: Secondary | ICD-10-CM | POA: Diagnosis not present

## 2023-09-09 DIAGNOSIS — N183 Chronic kidney disease, stage 3 unspecified: Secondary | ICD-10-CM | POA: Diagnosis not present

## 2023-09-09 DIAGNOSIS — K219 Gastro-esophageal reflux disease without esophagitis: Secondary | ICD-10-CM | POA: Diagnosis not present

## 2023-09-09 DIAGNOSIS — E785 Hyperlipidemia, unspecified: Secondary | ICD-10-CM | POA: Diagnosis not present

## 2023-09-09 DIAGNOSIS — M810 Age-related osteoporosis without current pathological fracture: Secondary | ICD-10-CM | POA: Diagnosis not present

## 2023-09-23 ENCOUNTER — Ambulatory Visit: Payer: PPO | Admitting: Psychiatry

## 2023-09-23 ENCOUNTER — Encounter: Payer: Self-pay | Admitting: Psychiatry

## 2023-09-23 DIAGNOSIS — G3184 Mild cognitive impairment, so stated: Secondary | ICD-10-CM

## 2023-09-23 DIAGNOSIS — F5105 Insomnia due to other mental disorder: Secondary | ICD-10-CM

## 2023-09-23 DIAGNOSIS — F250 Schizoaffective disorder, bipolar type: Secondary | ICD-10-CM

## 2023-09-23 DIAGNOSIS — F99 Mental disorder, not otherwise specified: Secondary | ICD-10-CM

## 2023-09-23 MED ORDER — LORAZEPAM 0.5 MG PO TABS
0.5000 mg | ORAL_TABLET | Freq: Every day | ORAL | 1 refills | Status: DC
Start: 2023-09-23 — End: 2023-11-21

## 2023-09-23 NOTE — Progress Notes (Signed)
Kaitlyn Hoover [id number] [birth year] 82 y.o.     Subjective:   Patient ID:  Kaitlyn Hoover is a 82 y.o. (DOB 05/20/1941) female.  Chief Complaint:  Chief Complaint  Patient presents with   Follow-up   Sleeping Problem    Anxiety Patient reports no confusion, decreased concentration, dizziness, nervous/anxious behavior or suicidal ideas.    Depression        Associated symptoms include fatigue.  Associated symptoms include no decreased concentration and no suicidal ideas.  Past medical history includes anxiety.    Kaitlyn Hoover presents for follow-up of psych issures  seen November 2020  and no meds were changed.  She called in August 2020  wanting to increase donepezil from 5 to 10 mg daily.  That was done.  03/28/20 appt with the following noted: Deep depression March.  No pcpt other than being confined.  No seasonal changes.  No real problems with depression in years.  Did worry about it.  Even had SI without plan.  Then got better.  Totally better now spontaneously.  She wonders about reducing ziprasidone. Doing OK overall.  Bored.  Not depressed.  Sleeping well with temazepam 30 mg with 8-9 hours.  Doing Zoom with church and looks forward to it.  Otherwise watching TV. Tolerated Aricept and has seen benefit for recall.  No paranoia or fear.   No med changes  06/26/20 TC stating: She's referring to the Temazepam 30 mg, she stopped on Thursday and hasn't slept in  4 days. She asked if eventually she would start sleeping without? I discussed that you would might recommend she go down to 15 mg, she really doesn't want to take it at all but will do as instructed. She does really she has to get some sleep. She's tried Benadryl but not helped either.  Following info was given to her: She has tapered off temazepam before but had to restart it.  She is tried alternatives clonazepam, Tranxene, trazodone, hydroxyzine.  She prefers to avoid meds that could have weight gain.  She  could probably stop temazepam more quickly and easily and still sleep if she took half of the 25 mg quetiapine but it may still give her a hangover. The way we tapered her off temazepam the last time was in the traditional manner of reducing the dose.  She still will have some insomnia but it likely is to be more bearable.  I would suggest we send in a prescription for 15 mg capsules and she take that for 2 to 4 weeks and she can try stopping again if she wants to at that point. If she stops temazepam cold [place] as she is doing currently she is likely to have pretty serious insomnia and very fragmented sleep that will not respond to Benadryl for a period of 2 to 4 weeks.  She has chronic insomnia which is the reason she is taken the temazepam and I am not sure what her sleep will be like once she gets through the withdrawal phase of stopping the temazepam. Pt via TC stated: Rtc to [facility] and she would prefer the 15 mg capsule of Temazepam, take 1 at hs. [facility] [address] Discussed her chronic insomnia and she verbalized understanding. Advised to call back with worsening condition or not effective for sleep.   07/05/20 appt with the following noted: Hell trying to stop temazepam 30 mg cold [place].  Couldn't sleep for days. Sleep is fine on  15 mg HS however. No interest, not going to church since Pandemic.  Not worrying much about Covid.  Got vaccine.  Can't motivate herself to go get clothes or yard work.  Just sits and watches TV or reads.  Only social with friends once monthly.  Don't understand while not going to church.  Unmotivated and some sadness. No sig worry or fear other than about her mental health and no paranoia. Doctor said iron was low.  Recent UTI and ABx helped energy.  Donepezil has really helped with her memory she is well pleased.  Patient denies any suicidal ideation. Plan: Added lamotrigine for depression  09/07/20 appt with following noted: More motivated  and active and productive and less napping.  Big difference with addtion of lamotrigine.  Got benefit by 50 mg daily. NO SE. Depression resolved "definitely" No paranoia.  Nor fearfulness nor mood swings. Geodon and Fanapt doses are perfect.  Mild mouth movements but not severe. Sleep well with temazepam. Plan: no med changes  03/08/21 appt noted: Just not having any trouble.  Got me on the right medication.  Occ a little down but it's ok. No SE with lamotrigine. Had a fall and hurt back.  Not much better and had her down some.  Don't get out much. 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  Not going to church bc it's hard. Sleep OK No paranoia.  09/06/21 appt noted: Colon CA dx and surg 3 weeks ago. 3 positive nodes and started chemo soon. Been awful.  Depressed  since surgery.  Not really bad.  Feels drugged in AM until about 1 PM No change in meds.  Sleep 10 -8.    No insomnia.   Worried over chemo and how it will affect her.  Not worried over cancer. No paranoia since here. Plan: Yes reduce temazepam to 7.5 mg HS Continue ziprasidone 40, Fanapt 4 mg 1/4 tablet daily, lamotrigine 100  12/18/21 appt noted: Doing OK. Sleeping fine with temazepam 7.5 mg HS No mood problems or swings.  No fear or anxiety.  No paranoia. Satisfied with meds.  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 Otherwise satisfied with activity.  06/18/22 appt noted: Psych meds: donepezil 10, lamotrigine 100, Fanapt 1 mg and Geodon 40 mg daily without changes. Fine without complaints.  Is tired a lot.  No napping but does lay down. Taking one capsule nightly of temazepam and she thinks it's 15 mg but computer shows 7.5 mg HS . Sleep 9 hours. No paranoia.  Not sig depression but does lay down a couple of times daily.  Not sad.  Not a lot of interest.  Is bored.  Occ finds Netflix interests.  Will procrastinate things.   Health has been stable. Cancer free (hx colon CA) Finished chemo.   Pleased with meds  and no new SE concerns.  No clenching any more with Fanapt added to Geodon.  12/24/22 appt noted: Had to put done dog of 18 years.   Other wise had been ok.   Psych meds: donepezil 10, lamotrigine 100, Fanapt 1 mg and Geodon 40 mg daily, temazepam 7.5 mg HS without changes. Otherwise ok re: mood.  No fear or paranoia at all. Sleep pretty good except awhile to get to sleep.  Then 8-9 hours.   No new health problems. Memory is surprisingly good.   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  09/23/23 appt noted: Psych meds: donepezil 10, lamotrigine  100, Fanapt 1 mg and Geodon 40 mg daily, temazepam 7.5 mg HS without changes. Temazepam not getting her to sleep. Going to bed 11 , to sleep an hour or more .  Wakes about 930-10.   No mood px.  No paranoia.  Anxiety manageable. Wt gain 23# to 142# over the last year.  Wonders about wt loss drugs.  Only eat 2 meals daily.  Might snack but not daily.   Less active bc difficulty with legs and back.  Don't walk much.   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  Past Psychiatric Medication Trials: Olanzapine haloperidol, Abilify, Latuda, Saphris, Geodon 60 , Fanapt 2 mg,  Depakote 250 mg 3 times d aily, benztropine, Artane,  clonazepam side effects, Tranxene increased appetite, temazepam,  trazodone, hydroxyzine She has been under the care of this office since 2003.  Review of Systems:  Review of Systems  Constitutional:  Positive for fatigue and unexpected weight change.  HENT:         Jaw clenching stable.  Musculoskeletal:  Positive for back pain and gait problem.  Neurological:  Positive for facial asymmetry and weakness. Negative for dizziness and tremors.  Psychiatric/Behavioral:  Positive for depression. Negative for agitation, behavioral problems, confusion, decreased concentration, dysphoric mood, hallucinations, self-injury, sleep disturbance and  suicidal ideas. The patient is not nervous/anxious and is not hyperactive.     Medications: I have reviewed the patient's current medications.  Current Outpatient Medications  Medication Sig Dispense Refill   acyclovir (ZOVIRAX) 400 MG tablet Take 400 mg at bedtime by mouth.      Ascorbic Acid (VITAMIN C) 1000 MG tablet Take 1,000 mg by mouth daily.     azelastine (OPTIVAR) 0.05 % ophthalmic solution Place 2 drops into both eyes daily as needed. Inching eyes     b complex vitamins capsule Take 1 capsule by mouth daily.     Calcium Citrate-Vitamin D (CALCIUM CITRATE + D PO) Take 1,200 mg by mouth at bedtime.     cholecalciferol (VITAMIN D) 25 MCG (1000 UNIT) tablet Take 1,000 Units by mouth daily.     Coenzyme Q10 (CO Q-10) 100 MG CAPS Take 100 mg by mouth daily.      diltiazem (CARDIZEM CD) 180 MG 24 hr capsule Take 180 mg by mouth daily.     donepezil (ARICEPT) 10 MG tablet Take 1 tablet (10 mg total) by mouth at bedtime. 90 tablet 3   ELIQUIS 2.5 MG TABS tablet TAKE 1 TABLET BY MOUTH TWICE A DAY 60 tablet 5   ferrous sulfate 324 MG TBEC Take 324 mg by mouth daily with breakfast.     fluticasone (FLONASE) 50 MCG/ACT nasal spray Place 2 sprays into both nostrils daily.     Ginkgo Biloba 120 MG TABS Take 240 mg by mouth daily.     iloperidone (FANAPT) 4 MG TABS tablet Take 1 tablet (4 mg total) by mouth at bedtime. (Patient taking differently: Take 1 mg by mouth at bedtime.) 30 tablet 3   lamoTRIgine (LAMICTAL) 100 MG tablet Take 1 tablet (100 mg total) by mouth daily. 90 tablet 3   levothyroxine (SYNTHROID, LEVOTHROID) 100 MCG tablet Take 100 mcg by mouth daily before breakfast.      LORazepam (ATIVAN) 0.5 MG tablet Take 1 tablet (0.5 mg total) by mouth at bedtime. 30 tablet 1   MAGNESIUM PO Take 250 mg by mouth every evening.     Melatonin 3 MG  TABS Take 3 mg by mouth at bedtime.     Omega-3 Fatty Acids (FISH OIL BURP-LESS) 1000 MG CAPS Take 3,000 mg by mouth 2 (two) times daily.      oxybutynin (DITROPAN XL) 15 MG 24 hr tablet Take 15 mg by mouth daily.      rosuvastatin (CRESTOR) 10 MG tablet TAKE 1 TABLET BY MOUTH UP TO 4 TIMES A WEEK ON SUNDAY, MONDAY,WEDNESDAY AND FRIDAY 48 tablet 0   sodium chloride (OCEAN) 0.65 % SOLN nasal spray Place 2 sprays into both nostrils every evening.     traMADol (ULTRAM) 50 MG tablet Take 1 tablet (50 mg total) by mouth 3 (three) times daily as needed. (Patient taking differently: Take 50 mg by mouth as needed.) 90 tablet 3   Turmeric 500 MG CAPS Take 500 mg by mouth daily.     vitamin B-12 (CYANOCOBALAMIN) 1000 MCG tablet Take 1,000 mcg by mouth daily.     ziprasidone (GEODON) 40 MG capsule Take 1 capsule (40 mg total) by mouth at bedtime. 90 capsule 3   No current facility-administered medications for this visit.    Medication Side Effects: Other: EPS prone.  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    Family History  Problem Relation Age of Onset   Hypertension Mother    Cancer Father        JAW BONE   COPD Brother    Atrial fibrillation Brother     Social History   Socioeconomic History   Marital status: Divorced    Spouse name: Not on file   Number of children: Not on file   Years of education: Not on file   Highest education level: Not on file  Occupational History   Not on file  Tobacco Use   Smoking status: Former    Current packs/day: 0.00    Average packs/day: 1 pack/day for 25.0 years (25.0  ttl pk-yrs)    Types: Cigarettes    Start date: 83    Quit date: 57    Years since quitting: 26.8   Smokeless tobacco: Never   Tobacco comments:    QUIT SMOKING IN 1998  Vaping Use   Vaping status: Never Used  Substance and Sexual Activity   Alcohol use: Not Currently   Drug use: No   Sexual activity: Not on file  Other Topics Concern   Not on file  Social History Narrative   Not on file   Social Determinants of Health   Financial Resource Strain: Not on file  Food Insecurity: Not on file  Transportation Needs: Not on file  Physical Activity: Not on file  Stress: Not on file  Social Connections: Unknown (08/09/2022)   [facility] from [facility]   Social Network    Social Network: Not on file  Intimate Partner Violence: Unknown (08/09/2022)   Received  from [facility]   HITS    Physically Hurt: Not on file    Insult or Talk Down To: Not on file    Threaten Physical Harm: Not on file    Scream or Curse: Not on file    Past Medical History, Surgical history, Social history, and Family history were reviewed and updated as appropriate.   Please see review of systems for further details on the patient's review from today.   Objective:   Physical Exam:  LMP  (LMP Unknown)   Physical Exam Constitutional:      General: She is not in acute distress. Musculoskeletal:        General: No deformity.  Neurological:     Mental Status: She is alert and oriented to person, place, and time.     Cranial Nerves: Dysarthria present.     Coordination: Coordination abnormal.     Comments: Mild jaw clenching and jaw movements not severe. More gait difficulty  Psychiatric:        Attention and Perception: Attention and perception normal. She does not perceive auditory or visual hallucinations.        Mood and Affect: Mood is not anxious or depressed. Affect is not labile, blunt or angry.        Speech: Speech normal. Speech is not slurred.         Behavior: Behavior normal. Behavior is not agitated. Behavior is cooperative.        Thought Content: Thought content normal. Thought content is not paranoid or delusional. Thought content does not include homicidal or suicidal ideation. Thought content does not include suicidal plan.        Cognition and Memory: Cognition and memory normal.        Judgment: Judgment normal.     Comments: History of paranoia in remission       Lab Review:     Component Value Date/Time   NA 142 08/05/2023 1534   K 4.6 08/05/2023 1534   CL 103 08/05/2023 1534   CO2 23 08/05/2023 1534   GLUCOSE 97 08/05/2023 1534   GLUCOSE 103 (H) 04/08/2023 1210   BUN 21 08/05/2023 1534   CREATININE 1.21 (H) 08/05/2023 1534   CREATININE 1.12 (H) 04/08/2023 1210   CALCIUM 9.5 08/05/2023 1534   PROT 7.1 03/14/2022 1306   PROT 7.2 02/12/2017 1013   ALBUMIN 4.2 03/14/2022 1306   ALBUMIN 4.0 02/12/2017 1013   AST 23 03/14/2022 1306   ALT 10 03/14/2022 1306   ALKPHOS 72 03/14/2022 1306   BILITOT 0.9 03/14/2022 1306   GFRNONAA 49 (L) 04/08/2023 1210   GFRAA 53 (L) 11/02/2020 1146       Component Value Date/Time   WBC 7.9 08/05/2023 1534   WBC 6.4 03/14/2022 1306   WBC 10.8 (H) 08/17/2021 0428   RBC 4.48 08/05/2023 1534   RBC 3.44 (L) 03/14/2022 1306   HGB 14.4 08/05/2023 1534   HCT 43.3 08/05/2023 1534   PLT 188 08/05/2023 1534   MCV 97 08/05/2023 1534   MCH 32.1 08/05/2023 1534   MCH 36.0 (H) 03/14/2022 1306   MCHC 33.3 08/05/2023 1534   MCHC 33.6 03/14/2022 1306   RDW 11.8 08/05/2023 1534   LYMPHSABS 1.4 03/14/2022 1306   LYMPHSABS 1.8 04/11/2017 0911   MONOABS 0.5 03/14/2022 1306   EOSABS 0.1 03/14/2022 1306   EOSABS 0.2 04/11/2017 0911   BASOSABS 0.1 03/14/2022 1306   BASOSABS 0.1 04/11/2017 0911    No results  found for: "POCLITH", "LITHIUM"   No results found for: "PHENYTOIN", "PHENOBARB", "VALPROATE", "CBMZ"   .res Assessment: Plan:    Schizoaffective disorder, bipolar type  (HCC)  Mild cognitive impairment  Insomnia due to other mental disorder - Plan: LORazepam (ATIVAN) 0.5 MG tablet   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 It does not appear severe enough to warrant additional medication.  30 min face to face time with patient was spent on counseling and coordination of care.  K has a long history of repeated episodes of paranoia associated with hypomania at times and at times independent of mood changes.  WE discussed her med-sensitivity.  As noted above she has tried multiple different antipsychotics and different antipsychotic combinations.   Discussed potential metabolic side effects associated with atypical antipsychotics, as well as potential risk for movement side effects. Advised pt to contact office if movement side effects occur.  She has mild dysarthria which may be related but it is no worse and it stable.  She is very specifically aware of the risk of tardive dyskinesia.   The unusual combination of antipsychotics is helpful in that the Fanapt actually offsets some of the dystonia that the Geodon tends to cause.  Lower dose of antipsychotics has led to worsening paranoia.  Her paranoia is well controlled at the current combination of medications. 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   stable  Depression resolved with lamotrigine   Supportive therapy over dealing with cancer and mental health piece of it. She's doing well now.  We discussed the short-term risks associated with benzodiazepines including sedation and increased fall risk among others.  Discussed long-term side effect risk including dependence, potential withdrawal symptoms, and the potential eventual dose-related risk of dementia.  But recent studies from 2020 dispute this association between benzodiazepines and dementia risk. Newer studies in 2020 do not support an association with dementia.  We have tried to taper her off benzodiazepines  for sleep and she was able to be off for a short period of time but then had recurrence of insomnia.  She is aware of the risks but the benefit for sleep is noteworthy and she is failed multiple other sleep meds.   Temazepam quit helping to get to sleep.  Continue ziprasidone 40, Fanapt 4 mg 1/4 tablet daily,  lamotrigine 100 Aricept 10 helped memory.  No SE. Still notices benefit. Switch temazepam to lorazepam 0.5 mg HS Reduce time in bed to 9 hour , her usual sleep time.  CBT  She has had worsening paranoia at lower doses of ziprasidone in the past.  No problems at 40 mg daily  Follow-up 6 months  Meredith Staggers MD, DFAPA   Please see After Visit Summary for patient specific instructions.  Future Appointments  Date Time Provider Department Center  11/04/2023 11:15 AM [facility] PHLEBOTOMIST [facility] None  11/04/2023 11:40 AM Ladene Artist, MD [facility] None  11/27/2023  9:45 AM Camnitz, Andree Coss, MD [facility]    No orders of the defined types were placed in this encounter.      -------------------------------

## 2023-10-09 ENCOUNTER — Other Ambulatory Visit: Payer: Self-pay | Admitting: Cardiology

## 2023-10-14 DIAGNOSIS — M62830 Muscle spasm of back: Secondary | ICD-10-CM | POA: Diagnosis not present

## 2023-10-14 DIAGNOSIS — M5416 Radiculopathy, lumbar region: Secondary | ICD-10-CM | POA: Diagnosis not present

## 2023-10-14 DIAGNOSIS — M9903 Segmental and somatic dysfunction of lumbar region: Secondary | ICD-10-CM | POA: Diagnosis not present

## 2023-10-14 DIAGNOSIS — M9901 Segmental and somatic dysfunction of cervical region: Secondary | ICD-10-CM | POA: Diagnosis not present

## 2023-10-20 DIAGNOSIS — R399 Unspecified symptoms and signs involving the genitourinary system: Secondary | ICD-10-CM | POA: Diagnosis not present

## 2023-10-22 DIAGNOSIS — M62830 Muscle spasm of back: Secondary | ICD-10-CM | POA: Diagnosis not present

## 2023-10-22 DIAGNOSIS — M9901 Segmental and somatic dysfunction of cervical region: Secondary | ICD-10-CM | POA: Diagnosis not present

## 2023-10-22 DIAGNOSIS — M9903 Segmental and somatic dysfunction of lumbar region: Secondary | ICD-10-CM | POA: Diagnosis not present

## 2023-10-22 DIAGNOSIS — M5416 Radiculopathy, lumbar region: Secondary | ICD-10-CM | POA: Diagnosis not present

## 2023-10-29 DIAGNOSIS — M9903 Segmental and somatic dysfunction of lumbar region: Secondary | ICD-10-CM | POA: Diagnosis not present

## 2023-10-29 DIAGNOSIS — M9901 Segmental and somatic dysfunction of cervical region: Secondary | ICD-10-CM | POA: Diagnosis not present

## 2023-10-29 DIAGNOSIS — M5416 Radiculopathy, lumbar region: Secondary | ICD-10-CM | POA: Diagnosis not present

## 2023-10-29 DIAGNOSIS — M62830 Muscle spasm of back: Secondary | ICD-10-CM | POA: Diagnosis not present

## 2023-11-04 ENCOUNTER — Inpatient Hospital Stay: Payer: PPO | Attending: Oncology | Admitting: Oncology

## 2023-11-04 ENCOUNTER — Inpatient Hospital Stay: Payer: PPO

## 2023-11-04 ENCOUNTER — Other Ambulatory Visit: Payer: PPO

## 2023-11-04 VITALS — BP 142/56 | HR 71 | Temp 98.2°F | Resp 18 | Ht 62.0 in | Wt 140.2 lb

## 2023-11-04 DIAGNOSIS — C183 Malignant neoplasm of hepatic flexure: Secondary | ICD-10-CM | POA: Insufficient documentation

## 2023-11-04 DIAGNOSIS — Z7901 Long term (current) use of anticoagulants: Secondary | ICD-10-CM | POA: Diagnosis not present

## 2023-11-04 DIAGNOSIS — R970 Elevated carcinoembryonic antigen [CEA]: Secondary | ICD-10-CM | POA: Insufficient documentation

## 2023-11-04 DIAGNOSIS — C184 Malignant neoplasm of transverse colon: Secondary | ICD-10-CM

## 2023-11-04 DIAGNOSIS — I4891 Unspecified atrial fibrillation: Secondary | ICD-10-CM | POA: Insufficient documentation

## 2023-11-04 DIAGNOSIS — R918 Other nonspecific abnormal finding of lung field: Secondary | ICD-10-CM | POA: Insufficient documentation

## 2023-11-04 DIAGNOSIS — Z9049 Acquired absence of other specified parts of digestive tract: Secondary | ICD-10-CM | POA: Insufficient documentation

## 2023-11-04 DIAGNOSIS — Z860100 Personal history of colon polyps, unspecified: Secondary | ICD-10-CM | POA: Diagnosis not present

## 2023-11-04 LAB — CEA (ACCESS): CEA (CHCC): 7.46 ng/mL — ABNORMAL HIGH (ref 0.00–5.00)

## 2023-11-04 NOTE — Progress Notes (Signed)
  [facility] OFFICE PROGRESS NOTE   Diagnosis: Colon cancer  INTERVAL HISTORY:   [name] returns as scheduled.  She feels well.  Good appetite.  No difficulty with bowel function.  No bleeding.  She is concerned that she is gaining weight.  Objective:  Vital signs in last 24 hours:  Blood pressure (!) 142/56, pulse 71, temperature 98.2 F (36.8 C), temperature source Temporal, resp. rate 18, height 5\' 2"  (1.575 m), weight 140 lb 3.2 oz (63.6 kg), SpO2 98%.    Lymphatics: No cervical, supraclavicular, axillary, or inguinal nodes Resp: Lungs clear bilaterally Cardio: Regular rate and rhythm GI: No hepatosplenomegaly, nontender, no mass Vascular: No leg edema   Lab Results:  Lab Results  Component Value Date   WBC 7.9 08/05/2023   HGB 14.4 08/05/2023   HCT 43.3 08/05/2023   MCV 97 08/05/2023   PLT 188 08/05/2023   NEUTROABS 4.5 03/14/2022    CMP  Lab Results  Component Value Date   NA 142 08/05/2023   K 4.6 08/05/2023   CL 103 08/05/2023   CO2 23 08/05/2023   GLUCOSE 97 08/05/2023   BUN 21 08/05/2023   CREATININE 1.21 (H) 08/05/2023   CALCIUM 9.5 08/05/2023   PROT 7.1 03/14/2022   ALBUMIN 4.2 03/14/2022   AST 23 03/14/2022   ALT 10 03/14/2022   ALKPHOS 72 03/14/2022   BILITOT 0.9 03/14/2022   GFRNONAA 49 (L) 04/08/2023   GFRAA 53 (L) 11/02/2020    Lab Results  Component Value Date   CEA1 5.4 (H) 08/06/2021   CEA 7.46 (H) 11/04/2023   Medications: I have reviewed the patient's current medications.   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 Tumor invades through the serosa, 3/25 lymph nodes, MSS, no loss of mismatch repair protein expression CTs 06/26/2021-eccentric wall thickening of the hepatic flexure, tiny pulmonary nodules measuring up to 2 mm-likely infectious or inflammatory Elevated preoperative CEA Cycle 1 Xeloda 09/10/2021 Cycle 2 Xeloda  10/01/2021 Cycle 3 Xeloda 10/22/2021 Cycle 4 Xeloda 11/12/2021-last dose 11/16/2021, hospitalized in [place] 11/18/2021 with small bowel obstruction Cycle 5 Xeloda 12/17/2021 Cycle 6 Xeloda 01/07/2022 Cycle 7 Xeloda 01/28/2022 Cycle 8 Xeloda 02/18/2022 Mild elevation of the CEA CTs 03/19/2022-no evidence of metastatic disease, unchanged tiny pulmonary nodules-likely benign Colonoscopy 09/03/2022-negative CTs 04/10/2023-no evidence of metastatic disease; again noted small scattered subpleural nodules lungs bilaterally, upper lobe predominant, measuring 2 to 3 mm, not considered suspicious for metastatic disease.  No new/suspicious pulmonary nodules. History of colon polyps including on the right colectomy specimen 08/15/2021, negative colonoscopy 09/03/2022 Bipolar disease Atrial fibrillation maintained on anticoagulation Subcutaneous lesion right lower chest inferior to the breast-possibly a lipoma Admission to the hospital in [place] 11/18/2021 with nausea/vomiting/diarrhea.  Diagnosed with small bowel obstruction.  Discharged home 11/25/2021. Small bilateral posterior cervical nodes on exam 01/03/2023    Disposition: [name] is in clinical remission from colon cancer.  She has chronic mild elevation of the CEA.  The CEA has not changed significantly over the past few years.  I suspect this is a benign nonspecific finding.  She will return for an office visit, CEA, and surveillance CTs in 6 months.  Thornton Papas, MD  11/04/2023  12:31 PM

## 2023-11-05 DIAGNOSIS — M62830 Muscle spasm of back: Secondary | ICD-10-CM | POA: Diagnosis not present

## 2023-11-05 DIAGNOSIS — M9901 Segmental and somatic dysfunction of cervical region: Secondary | ICD-10-CM | POA: Diagnosis not present

## 2023-11-05 DIAGNOSIS — M5416 Radiculopathy, lumbar region: Secondary | ICD-10-CM | POA: Diagnosis not present

## 2023-11-05 DIAGNOSIS — M9903 Segmental and somatic dysfunction of lumbar region: Secondary | ICD-10-CM | POA: Diagnosis not present

## 2023-11-06 ENCOUNTER — Encounter: Payer: Self-pay | Admitting: *Deleted

## 2023-11-17 DIAGNOSIS — M9901 Segmental and somatic dysfunction of cervical region: Secondary | ICD-10-CM | POA: Diagnosis not present

## 2023-11-17 DIAGNOSIS — M62830 Muscle spasm of back: Secondary | ICD-10-CM | POA: Diagnosis not present

## 2023-11-17 DIAGNOSIS — M9903 Segmental and somatic dysfunction of lumbar region: Secondary | ICD-10-CM | POA: Diagnosis not present

## 2023-11-17 DIAGNOSIS — M5416 Radiculopathy, lumbar region: Secondary | ICD-10-CM | POA: Diagnosis not present

## 2023-11-21 ENCOUNTER — Other Ambulatory Visit: Payer: Self-pay | Admitting: Psychiatry

## 2023-11-21 ENCOUNTER — Other Ambulatory Visit: Payer: Self-pay | Admitting: Cardiology

## 2023-11-21 DIAGNOSIS — I480 Paroxysmal atrial fibrillation: Secondary | ICD-10-CM

## 2023-11-21 DIAGNOSIS — F5105 Insomnia due to other mental disorder: Secondary | ICD-10-CM

## 2023-11-21 NOTE — Telephone Encounter (Signed)
 RF request received from pharmacy 11/21/23 at 1:43 PM. Has already been pended to provider.

## 2023-11-21 NOTE — Telephone Encounter (Signed)
 Pt Lvm @ 1:46p requesting refill of Lorazepam to   [id number] - [place], [place] - [address] AT [facility] [address] [place] [address] Phone: [phone]  Fax: [phone]   Next appt 5/13

## 2023-11-21 NOTE — Telephone Encounter (Addendum)
 Eliquis  2.5mg  refill request received. Patient is 83 years old, weight-63.6kg, Crea-1.21 on 08/05/23, Diagnosis-Afib, and last seen by Fairy Heinrich on 08/19/23. Will send in refill to requested pharmacy.   Spoke with PharmD and advised to continue eliquis  2.5mg  bid and at next refill re-evaluate doseage.

## 2023-11-21 NOTE — Telephone Encounter (Signed)
 Lf 12/3 lv 11/5

## 2023-11-21 NOTE — Telephone Encounter (Signed)
 Pt said she only has 2 or 3 pills left.

## 2023-11-27 ENCOUNTER — Encounter: Payer: Self-pay | Admitting: Cardiology

## 2023-11-27 ENCOUNTER — Ambulatory Visit: Payer: PPO | Attending: Cardiology | Admitting: Cardiology

## 2023-11-27 VITALS — BP 124/64 | HR 68 | Ht 62.0 in | Wt 141.2 lb

## 2023-11-27 DIAGNOSIS — I480 Paroxysmal atrial fibrillation: Secondary | ICD-10-CM

## 2023-11-27 DIAGNOSIS — I483 Typical atrial flutter: Secondary | ICD-10-CM

## 2023-11-27 DIAGNOSIS — I10 Essential (primary) hypertension: Secondary | ICD-10-CM

## 2023-11-27 DIAGNOSIS — D6869 Other thrombophilia: Secondary | ICD-10-CM | POA: Diagnosis not present

## 2023-11-27 DIAGNOSIS — I471 Supraventricular tachycardia, unspecified: Secondary | ICD-10-CM

## 2023-11-27 NOTE — Patient Instructions (Signed)
 Medication Instructions:  Your physician recommends that you continue on your current medications as directed. Please refer to the Current Medication list given to you today.  *If you need a refill on your cardiac medications before your next appointment, please call your pharmacy*   Lab Work: None ordered   Testing/Procedures: None ordered   Follow-Up: At [facility], you and your health needs are our priority.  As part of our continuing mission to provide you with exceptional heart care, we have created designated Provider Care Teams.  These Care Teams include your primary Cardiologist (physician) and Advanced Practice Providers (APPs -  Physician Assistants and Nurse Practitioners) who all work together to provide you with the care you need, when you need it.  Your next appointment:   6 month(s)  The format for your next appointment:   In Person  Provider:   You will follow up in the Atrial Fibrillation Clinic located at [facility]. Your provider will be: Clint R. Fenton, PA-C or Lake Bells, PA-C    Thank you for choosing [facility]!!   Dory Horn, RN [phone]

## 2023-11-27 NOTE — Progress Notes (Signed)
  Electrophysiology Office Note:   Date:  11/27/2023  ID:  Kaitlyn Hoover, DOB [birth year], MRN [id number]  Primary Cardiologist: None Primary Heart Failure: None Electrophysiologist: Edris Friedt Gladis Norton, MD      History of Present Illness:   Kaitlyn Hoover is a 83 y.o. female with h/o AVNRT, aortic and branch vessel calcifications, hypertension, hyperlipidemia, atrial fibrillation/flutter seen today for routine electrophysiology followup.   Since last being seen in our clinic the patient reports doing well.  She has no complaints.  She did have a cardioversion 08/06/2023.  She has remained in sinus rhythm since then.  She had atrial flutter at that time.  Currently she is happy with how she has been feeling.  she denies chest pain, palpitations, dyspnea, PND, orthopnea, nausea, vomiting, dizziness, syncope, edema, weight gain, or early satiety.   Review of systems complete and found to be negative unless listed in HPI.   EP Information / Studies Reviewed:    EKG is not ordered today. EKG from 08/19/23 reviewed which showed sinus rhythm        Risk Assessment/Calculations:    CHA2DS2-VASc Score = 5   This indicates a 7.2% annual risk of stroke. The patient's score is based upon: CHF History: 0 HTN History: 1 Diabetes History: 0 Stroke History: 0 Vascular Disease History: 1 Age Score: 2 Gender Score: 1             Physical Exam:   VS:  BP 124/64 (BP Location: Right Arm, Patient Position: Sitting, Cuff Size: Normal)   Pulse 68   Ht 5' 2 (1.575 m)   Wt 141 lb 3.2 oz (64 kg)   LMP  (LMP Unknown)   SpO2 97%   BMI 25.83 kg/m    Wt Readings from Last 3 Encounters:  11/27/23 141 lb 3.2 oz (64 kg)  11/04/23 140 lb 3.2 oz (63.6 kg)  08/19/23 141 lb 6.4 oz (64.1 kg)     GEN: Well nourished, well developed in no acute distress NECK: No JVD; No carotid bruits CARDIAC: Regular rate and rhythm, no murmurs, rubs, gallops RESPIRATORY:  Clear to auscultation without rales,  wheezing or rhonchi  ABDOMEN: Soft, non-tender, non-distended EXTREMITIES:  No edema; No deformity   ASSESSMENT AND PLAN:    1.  Paroxysmal atrial fibrillation/flutter: Post ablation in 2018.  She did have an episode of atrial flutter.  She is now post cardioversion.  Remains in sinus rhythm without further recurrence.  [name] continue with current management.  2.  Coronary artery disease: No current chest pain  3.  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  No obvious recurrence  4.  Hypertension: Currently well-controlled  5.  Secondary hypercoagulable state: Currently on Eliquis  for atrial fibrillation  Follow up with [facility] in 6 months  Signed, Ellary Casamento Gladis Norton, MD

## 2023-12-15 ENCOUNTER — Telehealth: Payer: Self-pay | Admitting: Psychiatry

## 2023-12-15 NOTE — Telephone Encounter (Signed)
Pt lvm that she is not sleeping. She said that it takes her 2 to 3 hours to fall asleep. She wants to know can he give her something to help her sleep .Maybe increase her ativan to 1 mg. Please give her a call at [phone]

## 2023-12-15 NOTE — Telephone Encounter (Signed)
What time is she going to bed?  What time does she fall asleep?  How many total hours of sleep is she getting? The primary determinant of what time you can go to sleep is the time you awaken.  If she is waking at 11 AM then it is unrealistic to expect to fall asleep before 16 hours later, or about 3 AM.  It is unrealistic to expect meds to move your sleep cycle earlier than that.  She can only expect to fall asleep earlier if she gets up earlier.   Let me know these details and we'll decide what to do. Meredith Staggers, MD, DFAPA

## 2023-12-15 NOTE — Telephone Encounter (Signed)
Kaitlyn Hoover reports that it is difficult for her to fall asleep- about 2/3 hours to fall asleep. She is getting in the bed about 10:30/11. She takes Ativan 0.5MG   right when she goes to sleep. She wakes up about 11 am. She says that her sleep is uninterrupted once she finally falls asleep.. She denies caffeine after 2, she only drinks 1 cup of coffee in the morning. She says that when she wakes up that its like she just wants to lay there longer, she doesn't feel rested. She denies napping during the day.  Please advise.

## 2023-12-16 NOTE — Telephone Encounter (Signed)
I will advise pt to take Ativan 0.5MG   1 hour before she goes to bed (not when she gets in the bed) and that she should go to bed earlier than 10:30/11. Also, set additional alarms to help awaken her before 11 am.   LVM TO RC.

## 2023-12-17 NOTE — Telephone Encounter (Signed)
Spoke with pt and relayed msg of going to bed earlier and taking Ativan and hour before she gets ready for bed. Pt said that she will try those recommendations.

## 2023-12-23 ENCOUNTER — Other Ambulatory Visit: Payer: Self-pay | Admitting: Psychiatry

## 2023-12-23 DIAGNOSIS — F250 Schizoaffective disorder, bipolar type: Secondary | ICD-10-CM

## 2024-01-09 ENCOUNTER — Other Ambulatory Visit: Payer: Self-pay | Admitting: Psychiatry

## 2024-01-09 DIAGNOSIS — F250 Schizoaffective disorder, bipolar type: Secondary | ICD-10-CM

## 2024-02-05 ENCOUNTER — Other Ambulatory Visit: Payer: Self-pay | Admitting: Psychiatry

## 2024-02-05 DIAGNOSIS — G3184 Mild cognitive impairment, so stated: Secondary | ICD-10-CM

## 2024-03-15 ENCOUNTER — Other Ambulatory Visit: Payer: Self-pay

## 2024-03-15 ENCOUNTER — Encounter (HOSPITAL_COMMUNITY): Payer: Self-pay | Admitting: Internal Medicine

## 2024-03-15 ENCOUNTER — Observation Stay (HOSPITAL_COMMUNITY)
Admission: EM | Admit: 2024-03-15 | Discharge: 2024-03-16 | Disposition: A | Discharge status: Home / Self Care | Attending: Family Medicine | Admitting: Family Medicine

## 2024-03-15 ENCOUNTER — Emergency Department (HOSPITAL_COMMUNITY)

## 2024-03-15 DIAGNOSIS — E039 Hypothyroidism, unspecified: Secondary | ICD-10-CM | POA: Insufficient documentation

## 2024-03-15 DIAGNOSIS — R262 Difficulty in walking, not elsewhere classified: Secondary | ICD-10-CM | POA: Diagnosis not present

## 2024-03-15 DIAGNOSIS — N179 Acute kidney failure, unspecified: Secondary | ICD-10-CM | POA: Diagnosis not present

## 2024-03-15 DIAGNOSIS — R2681 Unsteadiness on feet: Secondary | ICD-10-CM | POA: Diagnosis not present

## 2024-03-15 DIAGNOSIS — E876 Hypokalemia: Secondary | ICD-10-CM | POA: Diagnosis not present

## 2024-03-15 DIAGNOSIS — R112 Nausea with vomiting, unspecified: Secondary | ICD-10-CM | POA: Insufficient documentation

## 2024-03-15 DIAGNOSIS — Z79899 Other long term (current) drug therapy: Secondary | ICD-10-CM | POA: Diagnosis not present

## 2024-03-15 DIAGNOSIS — F259 Schizoaffective disorder, unspecified: Secondary | ICD-10-CM | POA: Insufficient documentation

## 2024-03-15 DIAGNOSIS — E785 Hyperlipidemia, unspecified: Secondary | ICD-10-CM | POA: Insufficient documentation

## 2024-03-15 DIAGNOSIS — I10 Essential (primary) hypertension: Secondary | ICD-10-CM | POA: Insufficient documentation

## 2024-03-15 DIAGNOSIS — E860 Dehydration: Secondary | ICD-10-CM | POA: Insufficient documentation

## 2024-03-15 DIAGNOSIS — I4891 Unspecified atrial fibrillation: Secondary | ICD-10-CM | POA: Insufficient documentation

## 2024-03-15 DIAGNOSIS — R109 Unspecified abdominal pain: Secondary | ICD-10-CM | POA: Diagnosis present

## 2024-03-15 DIAGNOSIS — N3090 Cystitis, unspecified without hematuria: Secondary | ICD-10-CM

## 2024-03-15 DIAGNOSIS — K5289 Other specified noninfective gastroenteritis and colitis: Secondary | ICD-10-CM | POA: Insufficient documentation

## 2024-03-15 DIAGNOSIS — K529 Noninfective gastroenteritis and colitis, unspecified: Secondary | ICD-10-CM

## 2024-03-15 LAB — CBC WITH DIFFERENTIAL/PLATELET
Abs Immature Granulocytes: 0.03 10*3/uL (ref 0.00–0.07)
Basophils Absolute: 0 10*3/uL (ref 0.0–0.1)
Basophils Relative: 0 %
Eosinophils Absolute: 0 10*3/uL (ref 0.0–0.5)
Eosinophils Relative: 0 %
HCT: 53.1 % — ABNORMAL HIGH (ref 36.0–46.0)
Hemoglobin: 17 g/dL — ABNORMAL HIGH (ref 12.0–15.0)
Immature Granulocytes: 0 %
Lymphocytes Relative: 11 %
Lymphs Abs: 1 10*3/uL (ref 0.7–4.0)
MCH: 31.9 pg (ref 26.0–34.0)
MCHC: 32 g/dL (ref 30.0–36.0)
MCV: 99.6 fL (ref 80.0–100.0)
Monocytes Absolute: 0.7 10*3/uL (ref 0.1–1.0)
Monocytes Relative: 8 %
Neutro Abs: 7.1 10*3/uL (ref 1.7–7.7)
Neutrophils Relative %: 81 %
Platelets: 187 10*3/uL (ref 150–400)
RBC: 5.33 MIL/uL — ABNORMAL HIGH (ref 3.87–5.11)
RDW: 12.9 % (ref 11.5–15.5)
WBC: 8.9 10*3/uL (ref 4.0–10.5)
nRBC: 0 % (ref 0.0–0.2)

## 2024-03-15 LAB — URINALYSIS, ROUTINE W REFLEX MICROSCOPIC
Bilirubin Urine: NEGATIVE
Glucose, UA: NEGATIVE mg/dL
Hgb urine dipstick: NEGATIVE
Ketones, ur: 20 mg/dL — AB
Nitrite: NEGATIVE
Protein, ur: 30 mg/dL — AB
Specific Gravity, Urine: 1.019 (ref 1.005–1.030)
pH: 5 (ref 5.0–8.0)

## 2024-03-15 LAB — COMPREHENSIVE METABOLIC PANEL WITH GFR
ALT: 19 U/L (ref 0–44)
AST: 33 U/L (ref 15–41)
Albumin: 4.4 g/dL (ref 3.5–5.0)
Alkaline Phosphatase: 71 U/L (ref 38–126)
Anion gap: 14 (ref 5–15)
BUN: 50 mg/dL — ABNORMAL HIGH (ref 8–23)
CO2: 22 mmol/L (ref 22–32)
Calcium: 9.4 mg/dL (ref 8.9–10.3)
Chloride: 101 mmol/L (ref 98–111)
Creatinine, Ser: 1.74 mg/dL — ABNORMAL HIGH (ref 0.44–1.00)
GFR, Estimated: 29 mL/min — ABNORMAL LOW (ref >60)
Glucose, Bld: 114 mg/dL — ABNORMAL HIGH (ref 70–99)
Potassium: 4.1 mmol/L (ref 3.5–5.1)
Sodium: 137 mmol/L (ref 135–145)
Total Bilirubin: 1 mg/dL (ref 0.0–1.2)
Total Protein: 8.8 g/dL — ABNORMAL HIGH (ref 6.5–8.1)

## 2024-03-15 LAB — CREATININE, URINE, RANDOM: Creatinine, Urine: 227 mg/dL

## 2024-03-15 LAB — MAGNESIUM: Magnesium: 2.7 mg/dL — ABNORMAL HIGH (ref 1.7–2.4)

## 2024-03-15 LAB — I-STAT CG4 LACTIC ACID, ED: Lactic Acid, Venous: 2 mmol/L (ref 0.5–1.9)

## 2024-03-15 LAB — LIPASE, BLOOD: Lipase: 60 U/L — ABNORMAL HIGH (ref 11–51)

## 2024-03-15 LAB — SODIUM, URINE, RANDOM: Sodium, Ur: 23 mmol/L

## 2024-03-15 MED ORDER — ILOPERIDONE 4 MG PO TABS
2.0000 mg | ORAL_TABLET | Freq: Every day | ORAL | Status: DC
  Filled 2024-03-15 (×2): qty 1

## 2024-03-15 MED ORDER — DILTIAZEM HCL ER COATED BEADS 180 MG PO CP24
180.0000 mg | ORAL_CAPSULE | Freq: Every day | ORAL | Status: DC
  Administered 2024-03-15 – 2024-03-16 (×2): 180 mg via ORAL
  Filled 2024-03-15 (×2): qty 1

## 2024-03-15 MED ORDER — ACETAMINOPHEN 650 MG RE SUPP: 650.0000 mg | Freq: Four times a day (QID) | RECTAL | Status: DC | PRN

## 2024-03-15 MED ORDER — SODIUM CHLORIDE 0.9% FLUSH
3.0000 mL | Freq: Two times a day (BID) | INTRAVENOUS | Status: DC
  Administered 2024-03-15 – 2024-03-16 (×2): 3 mL via INTRAVENOUS

## 2024-03-15 MED ORDER — MAGNESIUM OXIDE -MG SUPPLEMENT 400 (240 MG) MG PO TABS
400.0000 mg | ORAL_TABLET | Freq: Every evening | ORAL | Status: DC
  Administered 2024-03-15: 400 mg via ORAL
  Filled 2024-03-15: qty 1

## 2024-03-15 MED ORDER — ROSUVASTATIN CALCIUM 10 MG PO TABS
10.0000 mg | ORAL_TABLET | ORAL | Status: DC
  Administered 2024-03-16: 10 mg via ORAL
  Filled 2024-03-15: qty 1

## 2024-03-15 MED ORDER — APIXABAN 2.5 MG PO TABS
2.5000 mg | ORAL_TABLET | Freq: Two times a day (BID) | ORAL | Status: DC
  Administered 2024-03-15 – 2024-03-16 (×2): 2.5 mg via ORAL
  Filled 2024-03-15 (×2): qty 1

## 2024-03-15 MED ORDER — ACETAMINOPHEN 325 MG PO TABS: 650.0000 mg | ORAL_TABLET | Freq: Four times a day (QID) | ORAL | Status: DC | PRN

## 2024-03-15 MED ORDER — LABETALOL HCL 5 MG/ML IV SOLN
20.0000 mg | INTRAVENOUS | Status: DC | PRN
  Administered 2024-03-15: 20 mg via INTRAVENOUS
  Filled 2024-03-15: qty 4

## 2024-03-15 MED ORDER — LACTATED RINGERS IV BOLUS
1000.0000 mL | Freq: Once | INTRAVENOUS | Status: AC
  Administered 2024-03-15: 1000 mL via INTRAVENOUS

## 2024-03-15 MED ORDER — FENTANYL CITRATE PF 50 MCG/ML IJ SOSY: 50.0000 ug | PREFILLED_SYRINGE | INTRAMUSCULAR | Status: DC | PRN

## 2024-03-15 MED ORDER — SODIUM CHLORIDE 0.9 % IV SOLN: INTRAVENOUS | Status: DC

## 2024-03-15 MED ORDER — ENOXAPARIN SODIUM 30 MG/0.3ML IJ SOSY: 30.0000 mg | PREFILLED_SYRINGE | INTRAMUSCULAR | Status: DC

## 2024-03-15 MED ORDER — LORAZEPAM 0.5 MG PO TABS
0.5000 mg | ORAL_TABLET | Freq: Every day | ORAL | Status: DC
  Administered 2024-03-15: 0.5 mg via ORAL
  Filled 2024-03-15: qty 1

## 2024-03-15 MED ORDER — ONDANSETRON HCL 4 MG/2ML IJ SOLN
4.0000 mg | Freq: Once | INTRAMUSCULAR | Status: DC
  Filled 2024-03-15: qty 2

## 2024-03-15 MED ORDER — SODIUM CHLORIDE 0.9 % IV SOLN
1.0000 g | Freq: Once | INTRAVENOUS | Status: AC
  Administered 2024-03-15: 1 g via INTRAVENOUS
  Filled 2024-03-15: qty 10

## 2024-03-15 MED ORDER — LAMOTRIGINE 100 MG PO TABS
100.0000 mg | ORAL_TABLET | Freq: Every day | ORAL | Status: DC
  Administered 2024-03-15 – 2024-03-16 (×2): 100 mg via ORAL
  Filled 2024-03-15 (×2): qty 1

## 2024-03-15 MED ORDER — FAMOTIDINE 20 MG PO TABS
20.0000 mg | ORAL_TABLET | Freq: Every day | ORAL | Status: DC
  Administered 2024-03-15 – 2024-03-16 (×2): 20 mg via ORAL
  Filled 2024-03-15 (×2): qty 1

## 2024-03-15 MED ORDER — POLYETHYLENE GLYCOL 3350 17 G PO PACK: 17.0000 g | PACK | Freq: Every day | ORAL | Status: DC | PRN

## 2024-03-15 MED ORDER — ZIPRASIDONE HCL 40 MG PO CAPS
40.0000 mg | ORAL_CAPSULE | Freq: Every day | ORAL | Status: DC
  Administered 2024-03-15: 40 mg via ORAL
  Filled 2024-03-15: qty 1

## 2024-03-15 MED ORDER — DONEPEZIL HCL 10 MG PO TABS
10.0000 mg | ORAL_TABLET | Freq: Every day | ORAL | Status: DC
  Administered 2024-03-15: 10 mg via ORAL
  Filled 2024-03-15 (×2): qty 1

## 2024-03-15 MED ORDER — LEVOTHYROXINE SODIUM 100 MCG PO TABS
100.0000 ug | ORAL_TABLET | Freq: Every day | ORAL | Status: DC
  Administered 2024-03-16: 100 ug via ORAL
  Filled 2024-03-15: qty 1

## 2024-03-15 MED ORDER — ONDANSETRON HCL 4 MG/2ML IJ SOLN: 4.0000 mg | Freq: Four times a day (QID) | INTRAMUSCULAR | Status: DC | PRN

## 2024-03-15 MED ORDER — OXYBUTYNIN CHLORIDE ER 5 MG PO TB24
15.0000 mg | ORAL_TABLET | Freq: Every day | ORAL | Status: DC
  Administered 2024-03-15 – 2024-03-16 (×2): 15 mg via ORAL
  Filled 2024-03-15 (×2): qty 3
  Filled 2024-03-15: qty 1

## 2024-03-15 MED ORDER — HYDRALAZINE HCL 20 MG/ML IJ SOLN
10.0000 mg | INTRAMUSCULAR | Status: DC | PRN
  Administered 2024-03-15: 10 mg via INTRAVENOUS
  Filled 2024-03-15: qty 1

## 2024-03-15 MED ORDER — LACTATED RINGERS IV SOLN: INTRAVENOUS | Status: DC

## 2024-03-15 NOTE — Assessment & Plan Note (Signed)
 Is chronic, status post ablation as per the patient.  Continue with Cardizem , apixaban 

## 2024-03-15 NOTE — Assessment & Plan Note (Signed)
 Continue with Cardizem , patient blood pressure has been slightly above target in the ER.  Therefore patient given as needed doses of labetalol/hydralazine.  I anticipate this will self resolve as patient's acute illness is corrected.  Therefore we will monitor with home doses of oral regimen.

## 2024-03-15 NOTE — ED Triage Notes (Signed)
 Pt arrived reporting she ate with her son on Friday and since then has been experiencing N/V/D. Intermittent abdominal discomfort. Denies pain. No blood in stool. Son states patient is not drinking much due to vomiting.

## 2024-03-15 NOTE — H&P (Signed)
 History and Physical    Patient: Kaitlyn Hoover ZOX:[id number] DOB: 22-Nov-1940 DOA: 03/15/2024 DOS: the patient was seen and examined on 03/15/2024 PCP: Patient, No Pcp Per  Patient coming from: Home  Chief Complaint:  Chief Complaint  Patient presents with   Abdominal Pain   Nausea   Emesis   Dehydration   HPI: LAMAR CHOKSHI is a 83 y.o. female with medical history significant of atrial fibrillation, bipolar disorder, and other medical issues as listed below.  Patient seems to have been in her usual state of health till about 3 days ago when she reports an abrupt onset of vomiting.  Patient reports vomiting about 20 times in a 2-day.  Followed by new onset of diarrhea 5 times a day.  Without any bloody emesis or bright red blood per rectum.  Without any abdominal pain.  Patient does report subjective fever but these were not documented.  Patient denies any dysuria back pain or any trauma.  Today actually patient has had no vomiting or diarrhea.  But she became extremely fatigued/weak.  Unable to get out of bed.  Patient was brought to the ER accompanied by her son.  Triage note indicates that the patient was having intermittent abdominal discomfort/pain earlier.  ER workup is notable for slight AKI, patient declined CAT scan of the abdomen pelvis.  Patient at this time feels extremely hungry would like solid food.  Patient has no complaints at this time.   review of Systems: As mentioned in the history of present illness. All other systems reviewed and are negative. 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   Past Surgical History:  Procedure Laterality Date    ABDOMINAL HYSTERECTOMY     ATRIAL FIBRILLATION ABLATION N/A 04/25/2017   Procedure: Atrial Fibrillation Ablation;  Surgeon: Lei Pump, MD;  Location: [facility] INVASIVE CV LAB;  Service: Cardiovascular;  Laterality: N/A;   BREAST EXCISIONAL BIOPSY Bilateral    benign   CARDIOVERSION N/A 08/06/2023   Procedure: CARDIOVERSION;  Surgeon: Loyde Rule, MD;  Location: [facility] INVASIVE CV LAB;  Service: Cardiovascular;  Laterality: N/A;   CATARACT EXTRACTION W/ INTRAOCULAR LENS  IMPLANT, BILATERAL     CERVICAL LAMINECTOMY     CESAREAN SECTION     X2    LEGS      AGE 18-11   SEVERAL SURGERIES FOR POLIO    SVT ABLATION N/A 11/26/2017   Procedure: SVT ABLATION;  Surgeon: Lei Pump, MD;  Location: [facility] INVASIVE CV LAB;  Service: Cardiovascular;  Laterality: N/A;   THYROIDECTOMY     Social History:  reports that she quit smoking about 27 years ago. Her smoking use included cigarettes. She started smoking about 52 years ago. She has a 25 pack-year smoking history. She has never used smokeless tobacco. She reports that she does not currently use alcohol. She reports that she does not use drugs.  Allergies  Allergen Reactions   Sulfa Antibiotics Rash and Other (See Comments)    Childhood allergy   Ciprocinonide [Fluocinolone] Rash   Ciprofibrate Rash   Ciprofloxacin  Rash    Family History  Problem Relation Age of Onset   Hypertension Mother    Cancer Father        JAW BONE   COPD Brother    Atrial fibrillation Brother     Prior to Admission medications   Medication Sig Start Date End Date Taking? Authorizing Provider  acyclovir  (ZOVIRAX ) 400 MG tablet Take 400 mg at bedtime by mouth.  02/05/14  Yes [provider]  Ascorbic Acid  (VITAMIN C ) 1000 MG tablet Take 1,000 mg by mouth daily.   Yes [provider]  azelastine  (OPTIVAR ) 0.05 % ophthalmic solution Place 2 drops into both eyes daily as needed. Inching eyes 04/02/18  Yes [provider]  b complex  vitamins capsule Take 1 capsule by mouth daily.   Yes [provider]  cholecalciferol (VITAMIN D ) 25 MCG (1000 UNIT) tablet Take 1,000 Units by mouth daily.   Yes [provider]  Coenzyme Q10 (CO Q-10) 100 MG CAPS Take 100 mg by mouth daily.    Yes [provider]  diltiazem  (CARDIZEM  CD) 180 MG 24 hr capsule Take 180 mg by mouth daily.   Yes Ransom Byers, MD  donepezil  (ARICEPT ) 10 MG tablet TAKE 1 TABLET BY MOUTH EVERYDAY AT BEDTIME 02/05/24  Yes Cottle, Kennedy Peabody., MD  ELIQUIS  2.5 MG TABS tablet TAKE 1 TABLET BY MOUTH TWICE A DAY 11/21/23  Yes Camnitz, Will Gaylyn Keas, MD  famotidine (PEPCID) 20 MG tablet Take 20 mg by mouth daily.   Yes [provider]  ferrous sulfate 324 MG TBEC Take 324 mg by mouth daily with breakfast.   Yes [provider]  Ginkgo Biloba 120 MG TABS Take 240 mg by mouth daily.   Yes [provider]  iloperidone  (FANAPT ) 4 MG TABS tablet Take 1 tablet (4 mg total) by mouth at bedtime. Patient taking differently: Take 1 mg by mouth at bedtime. 12/24/22  Yes Cottle, Kennedy Peabody., MD  lamoTRIgine  (LAMICTAL ) 100 MG tablet TAKE 1 TABLET BY MOUTH EVERY DAY 01/09/24  Yes Cottle, Kennedy Peabody., MD  levothyroxine  (SYNTHROID , LEVOTHROID) 100 MCG tablet Take 100 mcg by mouth daily before breakfast.  02/16/14  Yes [provider]  LORazepam  (ATIVAN ) 0.5 MG tablet TAKE 1 TABLET BY MOUTH AT BEDTIME. 11/21/23  Yes Cottle, Kennedy Peabody., MD  MAGNESIUM  PO Take 250 mg by mouth every evening.   Yes [provider]  Melatonin 3 MG TABS Take 3 mg by mouth at bedtime.   Yes [provider]  Multiple Vitamin (MULTIVITAMIN) tablet Take 1 tablet by mouth daily.   Yes [provider]  nitrofurantoin (MACRODANTIN) 100 MG capsule Take 100 mg by mouth 2 (two) times daily. 03/10/24  Yes [provider]  Omega-3 Fatty Acids (FISH OIL  BURP-LESS) 1000 MG CAPS Take 3,000 mg by mouth 2 (two) times daily.   Yes  [provider]  oxybutynin  (DITROPAN  XL) 15 MG 24 hr tablet Take 15 mg by mouth daily.    Yes [provider]  rosuvastatin  (CRESTOR ) 10 MG tablet TAKE 1 TABLET BY MOUTH UP TO 4 TIMES A WEEK ON SUNDAY, MONDAY,WEDNESDAY AND FRIDAY 10/09/23  Yes Camnitz, Will Gaylyn Keas, MD  Turmeric 500 MG CAPS Take 500 mg by mouth daily.   Yes [provider]  vitamin B-12 (CYANOCOBALAMIN) 1000 MCG tablet Take 1,000 mcg by mouth daily.   Yes [provider]  ziprasidone  (GEODON ) 40 MG capsule TAKE 1 CAPSULE BY MOUTH AT  BEDTIME. 12/23/23  Yes Cottle, Kennedy Peabody., MD  fluticasone  (FLONASE ) 50 MCG/ACT nasal spray Place 2 sprays into both nostrils daily. Patient not taking: Reported on 03/15/2024    [provider]  sodium chloride  (OCEAN) 0.65 % SOLN nasal spray Place 2 sprays into both nostrils every evening.    [provider]    Physical Exam: Vitals:   03/15/24 1758 03/15/24 1813 03/15/24 1830 03/15/24 1836  BP: (!) 199/75 (!) 173/56 (!) 175/67 (!) 175/67  Pulse: 87 67 68   Resp: 18     Temp: 98.2 F (36.8 C)     TempSrc: Oral     SpO2: 98% 96% 97%   Weight:      Height:       General: Patient is alert and awake, hard of hearing but gives a coherent account of her symptoms.  Appears to be in no distress Respiratory exam: Bilateral intravesically Cardiovascular exam S1-S2 normal Abdomen bowel sounds are hyperactive all quadrants are soft and nontender Extremities warm without edema. Data Reviewed:  Labs on Admission:  Results for orders placed or performed during the hospital encounter of 03/15/24 (from the past 24 hours)  Comprehensive metabolic panel     Status: Abnormal   Collection Time: 03/15/24 12:15 PM  Result Value Ref Range   Sodium 137 135 - 145 mmol/L   Potassium 4.1 3.5 - 5.1 mmol/L   Chloride 101 98 - 111 mmol/L   CO2 22 22 - 32 mmol/L   Glucose, Bld 114 (H) 70 - 99 mg/dL   BUN 50 (H) 8 - 23 mg/dL   Creatinine, Ser 1.61 (H) 0.44 -  1.00 mg/dL   Calcium  9.4 8.9 - 10.3 mg/dL   Total Protein 8.8 (H) 6.5 - 8.1 g/dL   Albumin  4.4 3.5 - 5.0 g/dL   AST 33 15 - 41 U/L   ALT 19 0 - 44 U/L   Alkaline Phosphatase 71 38 - 126 U/L   Total Bilirubin 1.0 0.0 - 1.2 mg/dL   GFR, Estimated 29 (L) >60 mL/min   Anion gap 14 5 - 15  Lipase, blood     Status: Abnormal   Collection Time: 03/15/24 12:15 PM  Result Value Ref Range   Lipase 60 (H) 11 - 51 U/L  CBC with Diff     Status: Abnormal   Collection Time: 03/15/24 12:15 PM  Result Value Ref Range   WBC 8.9 4.0 - 10.5 K/uL   RBC 5.33 (H) 3.87 - 5.11 MIL/uL   Hemoglobin 17.0 (H) 12.0 - 15.0 g/dL   HCT 09.6 (H) 04.5 - 40.9 %   MCV 99.6 80.0 - 100.0 fL   MCH 31.9 26.0 - 34.0 pg   MCHC 32.0 30.0 - 36.0 g/dL   RDW 81.1 91.4 - 78.2 %   Platelets 187 150 - 400 K/uL   nRBC 0.0 0.0 - 0.2 %   Neutrophils Relative % 81 %   Neutro Abs 7.1 1.7 - 7.7 K/uL   Lymphocytes Relative 11 %   Lymphs Abs 1.0 0.7 - 4.0 K/uL   Monocytes Relative 8 %   Monocytes Absolute 0.7 0.1 - 1.0 K/uL   Eosinophils Relative 0 %   Eosinophils Absolute 0.0 0.0 - 0.5 K/uL   Basophils Relative 0 %   Basophils Absolute 0.0 0.0 - 0.1 K/uL   Immature Granulocytes 0 %   Abs Immature Granulocytes 0.03 0.00 - 0.07 K/uL  Urinalysis, Routine w reflex microscopic -Urine, Clean Catch  Status: Abnormal   Collection Time: 03/15/24 12:15 PM  Result Value Ref Range   Color, Urine YELLOW YELLOW   APPearance HAZY (A) CLEAR   Specific Gravity, Urine 1.019 1.005 - 1.030   pH 5.0 5.0 - 8.0   Glucose, UA NEGATIVE NEGATIVE mg/dL   Hgb urine dipstick NEGATIVE NEGATIVE   Bilirubin Urine NEGATIVE NEGATIVE   Ketones, ur 20 (A) NEGATIVE mg/dL   Protein, ur 30 (A) NEGATIVE mg/dL   Nitrite NEGATIVE NEGATIVE   Leukocytes,Ua SMALL (A) NEGATIVE   RBC / HPF 0-5 0 - 5 RBC/hpf   WBC, UA 11-20 0 - 5 WBC/hpf   Bacteria, UA RARE (A) NONE SEEN   Squamous Epithelial / HPF 0-5 0 - 5 /HPF   Mucus PRESENT    Hyaline Casts, UA PRESENT    Magnesium      Status: Abnormal   Collection Time: 03/15/24 12:15 PM  Result Value Ref Range   Magnesium  2.7 (H) 1.7 - 2.4 mg/dL  I-Stat Lactic Acid, ED     Status: Abnormal   Collection Time: 03/15/24 12:26 PM  Result Value Ref Range   Lactic Acid, Venous 2.0 (HH) 0.5 - 1.9 mmol/L   Comment NOTIFIED PHYSICIAN    Basic Metabolic Panel: Recent Labs  Lab 03/15/24 1215  NA 137  K 4.1  CL 101  CO2 22  GLUCOSE 114*  BUN 50*  CREATININE 1.74*  CALCIUM  9.4  MG 2.7*   Liver Function Tests: Recent Labs  Lab 03/15/24 1215  AST 33  ALT 19  ALKPHOS 71  BILITOT 1.0  PROT 8.8*  ALBUMIN  4.4   Recent Labs  Lab 03/15/24 1215  LIPASE 60*   No results for input(s): "AMMONIA" in the last 168 hours. CBC: Recent Labs  Lab 03/15/24 1215  WBC 8.9  NEUTROABS 7.1  HGB 17.0*  HCT 53.1*  MCV 99.6  PLT 187   Cardiac Enzymes: No results for input(s): "CKTOTAL", "CKMB", "CKMBINDEX", "TROPONINIHS" in the last 168 hours.  BNP (last 3 results) No results for input(s): "PROBNP" in the last 8760 hours. CBG: No results for input(s): "GLUCAP" in the last 168 hours.  Radiological Exams on Admission:  No results found.  chest X-ray    No intake/output data recorded. No intake/output data recorded.    Assessment and Plan: * AKI (acute kidney injury) (HCC) History of nausea vomiting diarrhea poor p.o. intake, hemoconcentration evident on CBC, this is most likely prerenal.  Patient has received 1 L fluid bolus in the ER.  I will continue with normal saline infusion at 75 an hour.  It is now hungry, allowed her to eat and drink.  Will trend creatinine.  I will add on urine sodium creatinine ratio to the lab testing  Hypothyroidism C/w synthroid   Gastroenteritis This seems to have started approximately 3 days ago and had pretty severe course in terms of vomiting and diarrhea.  However at this time patient reports no diarrhea today is feeling extremely hungry and wants to try  solid food.  No vomiting today.  No white count or fever.  Patient is s/p ceftriaxone single dose in the ER.  Patient is still pending C. difficile testing and stool pathogen panel.  However reports no diarrhea today again.  We will monitor the patient clinically I think further antibiotic therapy would not be beneficial to the patient.  In case of new diarrhea or any change in systemic status.  Will consider more dose of IV antibiotics.  Schizoaffective disorder (HCC) Continue  with Aricept , Fanapt , Geodon .  Patient uses Ativan  at bedtime at home. Coroborated with PDMP. Lamotirgine.  Hyperlipemia Continue with Crestor .  Patient takes it every other day.  Hypertension Continue with Cardizem , patient blood pressure has been slightly above target in the ER.  Therefore patient given as needed doses of labetalol/hydralazine.  I anticipate this will self resolve as patient's acute illness is corrected.  Therefore we will monitor with home doses of oral regimen.  Atrial fibrillation with RVR (HCC) Is chronic, status post ablation as per the patient.  Continue with Cardizem , apixaban       Advance Care Planning:   Code Status: Full Code   Consults: n/a  Family Communication: Offered to call patient family for patient.  However patient declined offer.  Patient says that her son and brother are aware of her hospitalization. Son did accompany patient to hospital  Severity of Illness: The appropriate patient status for this patient is INPATIENT. Inpatient status is judged to be reasonable and necessary in order to provide the required intensity of service to ensure the patient's safety. The patient's presenting symptoms, physical exam findings, and initial radiographic and laboratory data in the context of their chronic comorbidities is felt to place them at high risk for further clinical deterioration. Furthermore, it is not anticipated that the patient will be medically stable for discharge from the  hospital within 2 midnights of admission.   * I certify that at the point of admission it is my clinical judgment that the patient will require inpatient hospital care spanning beyond 2 midnights from the point of admission due to high intensity of service, high risk for further deterioration and high frequency of surveillance required.*  Author: Bennie Brave, MD 03/15/2024 7:01 PM  For on call review [email or web address].

## 2024-03-15 NOTE — Assessment & Plan Note (Signed)
 This seems to have started approximately 3 days ago and had pretty severe course in terms of vomiting and diarrhea.  However at this time patient reports no diarrhea today is feeling extremely hungry and wants to try solid food.  No vomiting today.  No white count or fever.  Patient is s/p ceftriaxone single dose in the ER.  Patient is still pending C. difficile testing and stool pathogen panel.  However reports no diarrhea today again.  We will monitor the patient clinically I think further antibiotic therapy would not be beneficial to the patient.  In case of new diarrhea or any change in systemic status.  Will consider more dose of IV antibiotics.

## 2024-03-15 NOTE — Assessment & Plan Note (Signed)
 History of nausea vomiting diarrhea poor p.o. intake, hemoconcentration evident on CBC, this is most likely prerenal.  Patient has received 1 L fluid bolus in the ER.  I will continue with normal saline infusion at 75 an hour.  It is now hungry, allowed her to eat and drink.  Will trend creatinine.  I will add on urine sodium creatinine ratio to the lab testing

## 2024-03-15 NOTE — Assessment & Plan Note (Addendum)
 Continue with Aricept , Fanapt , Geodon .  Patient uses Ativan  at bedtime at home. Coroborated with PDMP. Lamotirgine.

## 2024-03-15 NOTE — Assessment & Plan Note (Signed)
 C/w synthroid

## 2024-03-15 NOTE — ED Notes (Signed)
 ED TO INPATIENT HANDOFF REPORT  Name/Age/Gender Kaitlyn Hoover 83 y.o. female  Code Status    Code Status Orders  (From admission, onward)           Start     Ordered   03/15/24 1702  Full code  Continuous       Question:  By:  Answer:  Consent: discussion documented in EHR   03/15/24 1701           Code Status History     Date Active Date Inactive Code Status Order ID Comments User Context   08/15/2021 1729 08/23/2021 1743 Full Code [id number]  Joyce Nixon, MD Inpatient   04/25/2017 1602 04/26/2017 1504 Full Code [id number]  Lei Pump, MD Inpatient   07/31/2016 2214 08/02/2016 2034 Full Code [id number]  Audie Bleacher, MD Inpatient   04/18/2014 1140 04/19/2014 1217 Full Code [id number]  Graciela Lava, MD Inpatient       Home/SNF/Other Home  Chief Complaint AKI (acute kidney injury) (HCC) [N17.9]  Level of Care/Admitting Diagnosis ED Disposition     ED Disposition  Admit   Condition  --   Comment  Hospital Area: [facility] [[id number]]  Level of Care: Med-Surg [16]  May admit patient to [facility] or [facility] Smalling if equivalent level of care is available:: No  Covid Evaluation: Asymptomatic - no recent exposure (last 10 days) testing not required  Diagnosis: AKI (acute kidney injury) [facility]) [[id number]]  Admitting Physician: Bennie Brave [[id number]]  Attending Physician: Bennie Brave [[id number]]  Certification:: I certify this patient will need inpatient services for at least 2 midnights  Expected Medical Readiness: 03/17/2024          Medical History 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    Allergies Allergies   Allergen Reactions   Sulfa Antibiotics Rash and Other (See Comments)    Childhood allergy   Ciprocinonide [Fluocinolone] Rash   Ciprofibrate Rash   Ciprofloxacin Rash    IV Location/Drains/Wounds Patient Lines/Drains/Airways Status     Active Line/Drains/Airways     Name Placement date Placement time Site Days   Peripheral IV 03/15/24 22 G 1" Anterior;Left;Proximal Forearm 03/15/24  1216  Forearm  less than 1   Sheath 11/26/17 Right Venous 11/26/17  1210  Venous  2301   Sheath 11/26/17 Right Venous 11/26/17  1210  Venous  2301   Sheath 11/26/17 Left Venous 11/26/17  1211  Venous  2301   Sheath 11/26/17 Left Venous 11/26/17  1211  Venous  2301   Incision - 4 Ports Abdomen Right;Upper Right;Lower Left Left;Lower 08/15/21  1513  -- 943            Labs/Imaging Results for orders placed or performed during the hospital encounter of 03/15/24 (from the past 48 hours)  Comprehensive metabolic panel     Status: Abnormal   Collection Time: 03/15/24 12:15 PM  Result Value Ref Range   Sodium 137 135 - 145 mmol/L   Potassium 4.1 3.5 - 5.1 mmol/L   Chloride 101 98 - 111 mmol/L  CO2 22 22 - 32 mmol/L   Glucose, Bld 114 (H) 70 - 99 mg/dL    Comment: Glucose reference range applies only to samples taken after fasting for at least 8 hours.   BUN 50 (H) 8 - 23 mg/dL   Creatinine, Ser 8.29 (H) 0.44 - 1.00 mg/dL   Calcium  9.4 8.9 - 10.3 mg/dL   Total Protein 8.8 (H) 6.5 - 8.1 g/dL   Albumin  4.4 3.5 - 5.0 g/dL   AST 33 15 - 41 U/L   ALT 19 0 - 44 U/L   Alkaline Phosphatase 71 38 - 126 U/L   Total Bilirubin 1.0 0.0 - 1.2 mg/dL   GFR, Estimated 29 (L) >60 mL/min    Comment: (NOTE) Calculated using the CKD-EPI Creatinine Equation (2021)    Anion gap 14 5 - 15    Comment: Performed at [facility], [address] [place], [place] [address]  Lipase, blood     Status: Abnormal   Collection Time: 03/15/24 12:15 PM  Result Value Ref Range   Lipase 60 (H) 11 - 51 U/L     Comment: Performed at [facility], [address] [place], [place] [address]  CBC with Diff     Status: Abnormal   Collection Time: 03/15/24 12:15 PM  Result Value Ref Range   WBC 8.9 4.0 - 10.5 K/uL   RBC 5.33 (H) 3.87 - 5.11 MIL/uL   Hemoglobin 17.0 (H) 12.0 - 15.0 g/dL   HCT 84.6 (H) 96.2 - 95.2 %   MCV 99.6 80.0 - 100.0 fL   MCH 31.9 26.0 - 34.0 pg   MCHC 32.0 30.0 - 36.0 g/dL   RDW 84.1 32.4 - 40.1 %   Platelets 187 150 - 400 K/uL   nRBC 0.0 0.0 - 0.2 %   Neutrophils Relative % 81 %   Neutro Abs 7.1 1.7 - 7.7 K/uL   Lymphocytes Relative 11 %   Lymphs Abs 1.0 0.7 - 4.0 K/uL   Monocytes Relative 8 %   Monocytes Absolute 0.7 0.1 - 1.0 K/uL   Eosinophils Relative 0 %   Eosinophils Absolute 0.0 0.0 - 0.5 K/uL   Basophils Relative 0 %   Basophils Absolute 0.0 0.0 - 0.1 K/uL   Immature Granulocytes 0 %   Abs Immature Granulocytes 0.03 0.00 - 0.07 K/uL    Comment: Performed at [facility], [address] [place], [place] [address]  Urinalysis, Routine w reflex microscopic -Urine, Clean Catch     Status: Abnormal   Collection Time: 03/15/24 12:15 PM  Result Value Ref Range   Color, Urine YELLOW YELLOW   APPearance HAZY (A) CLEAR   Specific Gravity, Urine 1.019 1.005 - 1.030   pH 5.0 5.0 - 8.0   Glucose, UA NEGATIVE NEGATIVE mg/dL   Hgb urine dipstick NEGATIVE NEGATIVE   Bilirubin Urine NEGATIVE NEGATIVE   Ketones, ur 20 (A) NEGATIVE mg/dL   Protein, ur 30 (A) NEGATIVE mg/dL   Nitrite NEGATIVE NEGATIVE   Leukocytes,Ua SMALL (A) NEGATIVE   RBC / HPF 0-5 0 - 5 RBC/hpf   WBC, UA 11-20 0 - 5 WBC/hpf   Bacteria, UA RARE (A) NONE SEEN   Squamous Epithelial / HPF 0-5 0 - 5 /HPF   Mucus PRESENT    Hyaline Casts, UA PRESENT     Comment: Performed at [facility], [address] [place], [place] [address]  Magnesium      Status: Abnormal  Collection Time: 03/15/24 12:15 PM  Result Value Ref Range   Magnesium  2.7 (H)  1.7 - 2.4 mg/dL    Comment: Performed at [facility], [address] [place], [place] [address]  I-Stat Lactic Acid, ED     Status: Abnormal   Collection Time: 03/15/24 12:26 PM  Result Value Ref Range   Lactic Acid, Venous 2.0 (HH) 0.5 - 1.9 mmol/L   Comment NOTIFIED PHYSICIAN    No results found.  Pending Labs Unresulted Labs (From admission, onward)     Start     Ordered   03/22/24 0500  Creatinine, serum  (enoxaparin  (LOVENOX )    CrCl >/= 30 ml/min)  Weekly,   R     Comments: while on enoxaparin  therapy    03/15/24 1701   03/16/24 0500  APTT  Tomorrow morning,   R        03/15/24 1701   03/16/24 0500  Protime-INR  Tomorrow morning,   R        03/15/24 1701   03/16/24 0500  Basic metabolic panel  Tomorrow morning,   R        03/15/24 1701   03/16/24 0500  CBC  Tomorrow morning,   R        03/15/24 1701   03/15/24 1630  Urine Culture  Once,   URGENT       Question:  Indication  Answer:  Urgency/frequency   03/15/24 1630   03/15/24 1128  C Difficile Quick Screen w PCR reflex  (C Difficile quick screen w PCR reflex panel )  Once, for 24 hours,   URGENT       References:    CDiff Information Tool   03/15/24 1127   03/15/24 1128  Gastrointestinal Panel by PCR , Stool  (Gastrointestinal Panel by PCR, Stool                                                                                                                                                     **Does Not include CLOSTRIDIUM DIFFICILE testing. **If CDIFF testing is needed, place order from the "C Difficile Testing" order set.**)  Once,   URGENT        03/15/24 1127            Vitals/Pain Today's Vitals   03/15/24 1054 03/15/24 1055 03/15/24 1449 03/15/24 1700  BP:   (!) 169/71 (!) 192/75  Pulse:   94   Resp:   18 20  Temp:   99.4 F (37.4 C)   TempSrc:   Oral   SpO2:   96%   Weight:  64 kg    Height:  5\' 2"  (1.575 m)    PainSc: 0-No pain       Isolation Precautions Enteric precautions  (UV disinfection)  Medications Medications  ondansetron  (  ZOFRAN ) injection 4 mg (has no administration in time range)  fentaNYL  (SUBLIMAZE ) injection 50 mcg (has no administration in time range)  ondansetron  (ZOFRAN ) injection 4 mg (4 mg Intravenous Not Given 03/15/24 1245)  lactated ringers  infusion ( Intravenous New Bag/Given 03/15/24 1357)  cefTRIAXone (ROCEPHIN) 1 g in sodium chloride  0.9 % 100 mL IVPB (1 g Intravenous New Bag/Given 03/15/24 1707)  enoxaparin  (LOVENOX ) injection 30 mg (has no administration in time range)  0.9 %  sodium chloride  infusion (has no administration in time range)  acetaminophen  (TYLENOL ) tablet 650 mg (has no administration in time range)    Or  acetaminophen  (TYLENOL ) suppository 650 mg (has no administration in time range)  polyethylene glycol (MIRALAX  / GLYCOLAX ) packet 17 g (has no administration in time range)  sodium chloride  flush (NS) 0.9 % injection 3 mL (has no administration in time range)  lactated ringers  bolus 1,000 mL (1,000 mLs Intravenous New Bag/Given 03/15/24 1243)    Mobility walks

## 2024-03-15 NOTE — ED Provider Notes (Addendum)
 [place] EMERGENCY DEPARTMENT AT [facility] Provider Note   CSN: [id number] Arrival date & time: 03/15/24  1038     History  Chief Complaint  Patient presents with   Abdominal Pain   Nausea   Emesis   Dehydration    Kaitlyn Hoover is a 83 y.o. female.   Abdominal Pain Associated symptoms: diarrhea, nausea and vomiting   Emesis Associated symptoms: abdominal pain and diarrhea   Patient presents for abdominal pain, nausea, vomiting, diarrhea.  Medical history includes atrial fibrillation, anxiety, arthritis, bipolar disorder, GERD, HTN, colon cancer.  She completed cancer treatment 2 years ago.  Onset of current symptoms was 3 days ago.  Since that time, she has had nausea, vomiting, and p.o. intolerance.  She has had progressive weakness.  She denies any presence of blood in her vomitus or stool.  She has had some intermittent abdominal cramping with her symptoms but denies any abdominal pain currently.     Home Medications Prior to Admission medications   Medication Sig Start Date End Date Taking? Authorizing Provider  acyclovir  (ZOVIRAX ) 400 MG tablet Take 400 mg at bedtime by mouth.  02/05/14   [provider]  Ascorbic Acid  (VITAMIN C ) 1000 MG tablet Take 1,000 mg by mouth daily.    [provider]  azelastine  (OPTIVAR ) 0.05 % ophthalmic solution Place 2 drops into both eyes daily as needed. Inching eyes 04/02/18   [provider]  b complex vitamins capsule Take 1 capsule by mouth daily.    [provider]  cholecalciferol (VITAMIN D ) 25 MCG (1000 UNIT) tablet Take 1,000 Units by mouth daily.    [provider]  Coenzyme Q10 (CO Q-10) 100 MG CAPS Take 100 mg by mouth daily.     [provider]  diltiazem  (CARDIZEM  CD) 180 MG 24 hr capsule Take 180 mg by mouth daily.    Ransom Byers, MD  donepezil  (ARICEPT ) 10 MG tablet TAKE 1 TABLET BY MOUTH EVERYDAY AT BEDTIME 02/05/24   Cottle, Kennedy Peabody., MD   ELIQUIS  2.5 MG TABS tablet TAKE 1 TABLET BY MOUTH TWICE A DAY 11/21/23   Camnitz, Babetta Lesch, MD  ferrous sulfate 324 MG TBEC Take 324 mg by mouth daily with breakfast.    [provider]  fluticasone  (FLONASE ) 50 MCG/ACT nasal spray Place 2 sprays into both nostrils daily.    [provider]  Ginkgo Biloba 120 MG TABS Take 240 mg by mouth daily.    [provider]  iloperidone  (FANAPT ) 4 MG TABS tablet Take 1 tablet (4 mg total) by mouth at bedtime. Patient taking differently: Take 1 mg by mouth at bedtime. 12/24/22   Cottle, Kennedy Peabody., MD  lamoTRIgine  (LAMICTAL ) 100 MG tablet TAKE 1 TABLET BY MOUTH EVERY DAY 01/09/24   Cottle, Kennedy Peabody., MD  levothyroxine  (SYNTHROID , LEVOTHROID) 100 MCG tablet Take 100 mcg by mouth daily before breakfast.  02/16/14   [provider]  LORazepam  (ATIVAN ) 0.5 MG tablet TAKE 1 TABLET BY MOUTH AT BEDTIME. 11/21/23   Cottle, Kennedy Peabody., MD  MAGNESIUM  PO Take 250 mg by mouth every evening.    [provider]  Melatonin 3 MG TABS Take 3 mg by mouth at bedtime.    [provider]  Omega-3 Fatty Acids (FISH OIL  BURP-LESS) 1000 MG CAPS Take 3,000 mg by mouth 2 (two) times daily.    [provider]  oxybutynin  (DITROPAN  XL) 15 MG 24 hr tablet Take  15 mg by mouth daily.     [provider]  rosuvastatin  (CRESTOR ) 10 MG tablet TAKE 1 TABLET BY MOUTH UP TO 4 TIMES A WEEK ON SUNDAY, MONDAY,WEDNESDAY AND FRIDAY 10/09/23   Camnitz, Babetta Lesch, MD  sodium chloride  (OCEAN) 0.65 % SOLN nasal spray Place 2 sprays into both nostrils every evening.    [provider]  traMADol  (ULTRAM ) 50 MG tablet Take 1 tablet (50 mg total) by mouth 3 (three) times daily as needed. Patient taking differently: Take 50 mg by mouth as needed. 03/24/19   Sater, Sherida Dimmer, MD  Turmeric 500 MG CAPS Take 500 mg by mouth daily.    [provider]  vitamin B-12 (CYANOCOBALAMIN) 1000 MCG tablet Take 1,000 mcg by mouth daily.     [provider]  ziprasidone  (GEODON ) 40 MG capsule TAKE 1 CAPSULE BY MOUTH AT BEDTIME. 12/23/23   Cottle, Kennedy Peabody., MD      Allergies    Sulfa antibiotics, Ciprocinonide [fluocinolone], Ciprofibrate, and Ciprofloxacin    Review of Systems   Review of Systems  Constitutional:  Positive for activity change and appetite change.  Gastrointestinal:  Positive for abdominal pain, diarrhea, nausea and vomiting.  Neurological:  Positive for weakness (Generalized).  All other systems reviewed and are negative.   Physical Exam Updated Vital Signs BP (!) 169/71 (BP Location: Right Arm)   Pulse 94   Temp 99.4 F (37.4 C) (Oral)   Resp 18   Ht 5\' 2"  (1.575 m)   Wt 64 kg   LMP  (LMP Unknown)   SpO2 96%   BMI 25.81 kg/m  Physical Exam Vitals and nursing note reviewed.  Constitutional:      General: She is not in acute distress.    Appearance: She is well-developed. She is ill-appearing. She is not toxic-appearing or diaphoretic.  HENT:     Head: Normocephalic and atraumatic.     Mouth/Throat:     Mouth: Mucous membranes are dry.  Eyes:     Conjunctiva/sclera: Conjunctivae normal.  Cardiovascular:     Rate and Rhythm: Normal rate and regular rhythm.     Heart sounds: No murmur heard. Pulmonary:     Effort: Pulmonary effort is normal. No respiratory distress.     Breath sounds: Normal breath sounds.  Chest:     Chest wall: No tenderness.  Abdominal:     Palpations: Abdomen is soft.     Tenderness: There is generalized abdominal tenderness. There is no guarding or rebound.  Musculoskeletal:        General: No swelling.     Cervical back: Neck supple.  Skin:    General: Skin is warm and dry.     Coloration: Skin is not cyanotic or jaundiced.  Neurological:     General: No focal deficit present.     Mental Status: She is alert and oriented to person, place, and time.  Psychiatric:        Mood and Affect: Mood normal.        Behavior: Behavior normal.     ED  Results / Procedures / Treatments   Labs (all labs ordered are listed, but only abnormal results are displayed) Labs Reviewed  COMPREHENSIVE METABOLIC PANEL WITH GFR - Abnormal; Notable for the following components:      Result Value   Glucose, Bld 114 (*)    BUN 50 (*)    Creatinine, Ser 1.74 (*)    Total Protein 8.8 (*)  GFR, Estimated 29 (*)    All other components within normal limits  LIPASE, BLOOD - Abnormal; Notable for the following components:   Lipase 60 (*)    All other components within normal limits  CBC WITH DIFFERENTIAL/PLATELET - Abnormal; Notable for the following components:   RBC 5.33 (*)    Hemoglobin 17.0 (*)    HCT 53.1 (*)    All other components within normal limits  URINALYSIS, ROUTINE W REFLEX MICROSCOPIC - Abnormal; Notable for the following components:   APPearance HAZY (*)    Ketones, ur 20 (*)    Protein, ur 30 (*)    Leukocytes,Ua SMALL (*)    Bacteria, UA RARE (*)    All other components within normal limits  MAGNESIUM  - Abnormal; Notable for the following components:   Magnesium  2.7 (*)    All other components within normal limits  I-STAT CG4 LACTIC ACID, ED - Abnormal; Notable for the following components:   Lactic Acid, Venous 2.0 (*)    All other components within normal limits  C DIFFICILE QUICK SCREEN W PCR REFLEX    GASTROINTESTINAL PANEL BY PCR, STOOL (REPLACES STOOL CULTURE)  URINE CULTURE    EKG None  Radiology No results found.  Procedures Procedures    Medications Ordered in ED Medications  ondansetron  (ZOFRAN ) injection 4 mg (has no administration in time range)  fentaNYL  (SUBLIMAZE ) injection 50 mcg (has no administration in time range)  ondansetron  (ZOFRAN ) injection 4 mg (4 mg Intravenous Not Given 03/15/24 1245)  lactated ringers  infusion ( Intravenous New Bag/Given 03/15/24 1357)  cefTRIAXone (ROCEPHIN) 1 g in sodium chloride  0.9 % 100 mL IVPB (has no administration in time range)  lactated ringers  bolus 1,000 mL  (1,000 mLs Intravenous New Bag/Given 03/15/24 1243)    ED Course/ Medical Decision Making/ A&P                                 Medical Decision Making Amount and/or Complexity of Data Reviewed Labs: ordered. Radiology: ordered.  Risk Prescription drug management.   This patient presents to the ED for concern of nausea and vomiting, this involves an extensive number of treatment options, and is a complaint that carries with it a high risk of complications and morbidity.  The differential diagnosis includes enteritis, gastritis, intra-abdominal infection, dehydration, metabolic derangements   Co morbidities that complicate the patient evaluation  atrial fibrillation, anxiety, arthritis, bipolar disorder, GERD, HTN, colon cancer   Additional history obtained:  Additional history obtained from patient's son External records from outside source obtained and reviewed including EMR   Lab Tests:  I Ordered, and personally interpreted labs.  The pertinent results include: AKI is present.  Azotemia suggest prerenal etiology. Elevation in hemoglobin from baseline suggests hemoconcentration.  No leukocytosis is present. Lipase is slightly elevated but not consistent with pancreatitis.  Hepatobiliary enzymes are normal.  Urinalysis is equivocal.     Imaging Studies ordered:  I ordered imaging studies including CT of abdomen and pelvis I independently visualized and interpreted imaging which showed (pending at time of signout) I agree with the radiologist interpretation   Cardiac Monitoring: / EKG:  The patient was maintained on a cardiac monitor.  I personally viewed and interpreted the cardiac monitored which showed an underlying rhythm of: Sinus rhythm   Problem List / ED Course / Critical interventions / Medication management  Patient presents for nausea, vomiting, diarrhea, and p.o. intolerance over  the past 3 days.  On arrival, she is alert and oriented.  Vital signs are  normal.  Physical exam is notable for dry oral mucosa.  Currently, abdomen is soft with minimal tenderness.  IV fluids were ordered for hydration.  Zofran  was ordered for nausea.  Workup was initiated.  Laboratory workup notable for AKI.  Additional IV fluids ordered.  Patient's urine does show small amount of bacteria and pyuria.  She states that she has had some urinary frequency and urgency lately.  She actually started taking some antibiotics at home for self treatment of UTI.  This got interrupted by her recent GI upset.  Given her recent urine symptoms, will send for cultures and treat empirically.  For UTI.  Ceftriaxone was ordered.  Although CT scan was planned, patient declined to this.  Given her minimal abdominal discomfort at this time, I feel this is reasonable.  Patient to be admitted for AKI.  I ordered medication including IV fluids for hydration; Zofran  for nausea Reevaluation of the patient after these medicines showed that the patient improved I have reviewed the patients home medicines and have made adjustments as needed  Social Determinants of Health:  Lives at home with her brother        Final Clinical Impression(s) / ED Diagnoses Final diagnoses:  AKI (acute kidney injury) (HCC)  Dehydration  Cystitis    Rx / DC Orders ED Discharge Orders     None         Iva Mariner, MD 03/15/24 1620    Iva Mariner, MD 03/15/24 1630

## 2024-03-15 NOTE — Assessment & Plan Note (Signed)
 Continue with Crestor .  Patient takes it every other day.

## 2024-03-16 ENCOUNTER — Emergency Department (HOSPITAL_COMMUNITY)

## 2024-03-16 ENCOUNTER — Emergency Department (HOSPITAL_COMMUNITY)
Admission: EM | Admit: 2024-03-16 | Discharge: 2024-03-17 | Disposition: A | Discharge status: Home / Self Care | Source: Home / Self Care | Attending: Emergency Medicine | Admitting: Emergency Medicine

## 2024-03-16 DIAGNOSIS — K529 Noninfective gastroenteritis and colitis, unspecified: Secondary | ICD-10-CM | POA: Diagnosis not present

## 2024-03-16 DIAGNOSIS — E039 Hypothyroidism, unspecified: Secondary | ICD-10-CM | POA: Insufficient documentation

## 2024-03-16 DIAGNOSIS — Z87891 Personal history of nicotine dependence: Secondary | ICD-10-CM | POA: Insufficient documentation

## 2024-03-16 DIAGNOSIS — Y92009 Unspecified place in unspecified non-institutional (private) residence as the place of occurrence of the external cause: Secondary | ICD-10-CM | POA: Insufficient documentation

## 2024-03-16 DIAGNOSIS — M545 Low back pain, unspecified: Secondary | ICD-10-CM | POA: Insufficient documentation

## 2024-03-16 DIAGNOSIS — N3090 Cystitis, unspecified without hematuria: Secondary | ICD-10-CM

## 2024-03-16 DIAGNOSIS — Z7901 Long term (current) use of anticoagulants: Secondary | ICD-10-CM | POA: Insufficient documentation

## 2024-03-16 DIAGNOSIS — Y9301 Activity, walking, marching and hiking: Secondary | ICD-10-CM | POA: Insufficient documentation

## 2024-03-16 DIAGNOSIS — S0990XA Unspecified injury of head, initial encounter: Secondary | ICD-10-CM | POA: Insufficient documentation

## 2024-03-16 DIAGNOSIS — W19XXXA Unspecified fall, initial encounter: Secondary | ICD-10-CM

## 2024-03-16 DIAGNOSIS — W01198A Fall on same level from slipping, tripping and stumbling with subsequent striking against other object, initial encounter: Secondary | ICD-10-CM | POA: Insufficient documentation

## 2024-03-16 DIAGNOSIS — I10 Essential (primary) hypertension: Secondary | ICD-10-CM | POA: Insufficient documentation

## 2024-03-16 DIAGNOSIS — M542 Cervicalgia: Secondary | ICD-10-CM | POA: Insufficient documentation

## 2024-03-16 DIAGNOSIS — N179 Acute kidney failure, unspecified: Secondary | ICD-10-CM | POA: Diagnosis not present

## 2024-03-16 LAB — I-STAT CHEM 8, ED
BUN: 26 mg/dL — ABNORMAL HIGH (ref 8–23)
Calcium, Ion: 1.07 mmol/L — ABNORMAL LOW (ref 1.15–1.40)
Chloride: 108 mmol/L (ref 98–111)
Creatinine, Ser: 1.2 mg/dL — ABNORMAL HIGH (ref 0.44–1.00)
Glucose, Bld: 157 mg/dL — ABNORMAL HIGH (ref 70–99)
HCT: 38 % (ref 36.0–46.0)
Hemoglobin: 12.9 g/dL (ref 12.0–15.0)
Potassium: 3.8 mmol/L (ref 3.5–5.1)
Sodium: 138 mmol/L (ref 135–145)
TCO2: 18 mmol/L — ABNORMAL LOW (ref 22–32)

## 2024-03-16 LAB — PROTIME-INR
INR: 1.2 (ref 0.8–1.2)
INR: 1.2 (ref 0.8–1.2)
Prothrombin Time: 15.4 s — ABNORMAL HIGH (ref 11.4–15.2)
Prothrombin Time: 15.7 s — ABNORMAL HIGH (ref 11.4–15.2)

## 2024-03-16 LAB — CBC
HCT: 40.2 % (ref 36.0–46.0)
Hemoglobin: 12.8 g/dL (ref 12.0–15.0)
MCH: 32.1 pg (ref 26.0–34.0)
MCHC: 31.8 g/dL (ref 30.0–36.0)
MCV: 100.8 fL — ABNORMAL HIGH (ref 80.0–100.0)
Platelets: 164 10*3/uL (ref 150–400)
RBC: 3.99 MIL/uL (ref 3.87–5.11)
RDW: 12.8 % (ref 11.5–15.5)
WBC: 6.6 10*3/uL (ref 4.0–10.5)
nRBC: 0 % (ref 0.0–0.2)

## 2024-03-16 LAB — URINE CULTURE: Culture: <10000 — AB

## 2024-03-16 LAB — BASIC METABOLIC PANEL WITH GFR
Anion gap: 8 (ref 5–15)
BUN: 38 mg/dL — ABNORMAL HIGH (ref 8–23)
CO2: 23 mmol/L (ref 22–32)
Calcium: 8.3 mg/dL — ABNORMAL LOW (ref 8.9–10.3)
Chloride: 108 mmol/L (ref 98–111)
Creatinine, Ser: 1.14 mg/dL — ABNORMAL HIGH (ref 0.44–1.00)
GFR, Estimated: 48 mL/min — ABNORMAL LOW (ref >60)
Glucose, Bld: 90 mg/dL (ref 70–99)
Potassium: 3.4 mmol/L — ABNORMAL LOW (ref 3.5–5.1)
Sodium: 139 mmol/L (ref 135–145)

## 2024-03-16 LAB — I-STAT CG4 LACTIC ACID, ED: Lactic Acid, Venous: 2.1 mmol/L (ref 0.5–1.9)

## 2024-03-16 LAB — APTT: aPTT: 23 s — ABNORMAL LOW (ref 24–36)

## 2024-03-16 MED ORDER — POTASSIUM CHLORIDE 20 MEQ PO PACK
40.0000 meq | PACK | Freq: Once | ORAL | Status: AC
  Administered 2024-03-16: 40 meq via ORAL
  Filled 2024-03-16: qty 2

## 2024-03-16 NOTE — Evaluation (Signed)
 Physical Therapy Evaluation Patient Details Name: Kaitlyn Hoover MRN: [id number] DOB: [birth year] Today's Date: 03/16/2024  History of Present Illness  Kaitlyn Hoover is a 83 y.o. presents 03/15/24  with 3 days of nausea, vomiting and diarrhea. PMH: atrial fibrillation, bipolar disorder, post polio syndrome, HTN, anxiety.  Clinical Impression  Pt admitted with above diagnosis.  Pt currently with functional limitations due to the deficits listed below (see PT Problem List). Pt will benefit from acute skilled PT to increase their independence and safety with mobility to allow discharge.     The patient presents with weakness, able to ambulate  in room with and without device. 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  At baseline, patient is independent and drives. Patient does use a RW or no device, states she tends to cruise  in the house on walls and furniture. Patient does endorse  history of falls.        If plan is discharge home, recommend the following: A little help with walking and/or transfers;Assistance with cooking/housework;Assist for transportation;Help with stairs or ramp for entrance;A little help with bathing/dressing/bathroom   Can travel by private vehicle        Equipment Recommendations None recommended by PT  Recommendations for Other Services       Functional Status Assessment Patient has had a recent decline in their functional status and demonstrates the ability to make significant improvements in function in a reasonable and predictable amount of time.     Precautions / Restrictions Precautions Precautions: Fall Recall of Precautions/Restrictions: Intact Restrictions Weight Bearing Restrictions Per Provider Order: No      Mobility  Bed Mobility Overal bed mobility: Needs Assistance Bed Mobility: Supine to Sit     Supine to sit: Modified independent (Device/Increase time)     General bed mobility comments: extra time     Transfers Overall transfer level: Needs assistance Equipment used: Rolling walker (2 wheels), None Transfers: Sit to/from Stand Sit to Stand: Min assist, Supervision           General transfer comment: initially stood with mod support with no device, supervision at RW, then CGA with no device, improved with repetition    Ambulation/Gait Ambulation/Gait assistance: Min assist Gait Distance (Feet): 30 Feet (then 20') Assistive device: Rolling walker (2 wheels), None Gait Pattern/deviations: Step-to pattern Gait velocity: dedcr     General Gait Details: list to right  with  paralysis,  ambulated with RW  then no device, With no device tended to  reach for objects  to steady, patient  states she  does this at baseline when not using RW  Stairs            Wheelchair Mobility     Tilt Bed    Modified Rankin (Stroke Patients Only)       Balance Overall balance assessment: History of Falls, Mild deficits observed, not formally tested                                           Pertinent Vitals/Pain Pain Assessment Pain Assessment: No/denies pain    Home Living Family/patient expects to be discharged to:: Private residence Living Arrangements: Other relatives Available Help at Discharge: Family;Friend(s) Type of Home: House Home Access: Stairs to enter Entrance Stairs-Rails: Doctor, general practice of Steps: 3 or 10   Home Layout: Two level Home Equipment: Rolling  Walker (2 wheels) Additional Comments: brother lives in basement    Prior Function Prior Level of Function : Independent/Modified Independent;Driving                     Extremity/Trunk Assessment   Upper Extremity Assessment Upper Extremity Assessment: Overall WFL for tasks assessed    Lower Extremity Assessment Lower Extremity Assessment: RLE deficits/detail RLE Deficits / Details: post polio, 0 dorsiflexion, knee hyperextends , able to step     Cervical / Trunk Assessment Cervical / Trunk Assessment: Kyphotic  Communication   Communication Communication: Impaired Factors Affecting Communication: Hearing impaired    Cognition Arousal: Alert Behavior During Therapy: WFL for tasks assessed/performed   PT - Cognitive impairments: No apparent impairments                         Following commands: Intact       Cueing       General Comments      Exercises     Assessment/Plan    PT Assessment Patient needs continued PT services  PT Problem List Decreased strength;Decreased balance;Decreased mobility       PT Treatment Interventions DME instruction;Therapeutic exercise;Gait training;Functional mobility training;Therapeutic activities;Patient/family education    PT Goals (Current goals can be found in the Care Plan section)  Acute Rehab PT Goals Patient Stated Goal: gomhome and sleep PT Goal Formulation: With patient Time For Goal Achievement: 03/30/24 Potential to Achieve Goals: Good    Frequency Min 3X/week     Co-evaluation               AM-PAC PT "6 Clicks" Mobility  Outcome Measure Help needed turning from your back to your side while in a flat bed without using bedrails?: None Help needed moving from lying on your back to sitting on the side of a flat bed without using bedrails?: None Help needed moving to and from a bed to a chair (including a wheelchair)?: None Help needed standing up from a chair using your arms (e.g., wheelchair or bedside chair)?: A Little Help needed to walk in hospital room?: A Little Help needed climbing 3-5 steps with a railing? : A Little 6 Click Score: 21    End of Session Equipment Utilized During Treatment: Gait belt Activity Tolerance: Patient tolerated treatment well Patient left: in chair;with call bell/phone within reach;with chair alarm set Nurse Communication: Mobility status PT Visit Diagnosis: Unsteadiness on feet (R26.81);Difficulty in  walking, not elsewhere classified (R26.2);Other symptoms and signs involving the nervous system (R29.898)    Time: 1610-9604 PT Time Calculation (min) (ACUTE ONLY): 29 min   Charges:   PT Evaluation $PT Eval Low Complexity: 1 Low PT Treatments $Gait Training: 8-22 mins PT General Charges $$ ACUTE PT VISIT: 1 Visit         Abelina Hoes PT Acute Rehabilitation Services Office [phone] Weekend [phone]   Dareen Ebbing 03/16/2024, 12:27 PM

## 2024-03-16 NOTE — Discharge Summary (Addendum)
 Physician Discharge Summary   Patient: Kaitlyn Hoover MRN: [id number] DOB: Oct 07, 1941  Admit date:     03/15/2024  Discharge date: 03/16/24  Discharge Physician: Ozell Blunt   PCP: Patient, No Pcp Per   Recommendations at discharge:   Follow-up PCP in 1 week Follow-up urine culture results as outpatient with PCP Patient to go home with home health PT  Discharge Diagnoses: Principal Problem:   AKI (acute kidney injury) (HCC) Active Problems:   Gastroenteritis   Hypothyroidism  Resolved Problems:   * No resolved hospital problems. *  Hospital Course: 83 y.o. female with medical history significant of atrial fibrillation, bipolar disorder, and other medical issues as listed below.  Patient seems to have been in her usual state of health till about 3 days ago when she reports an abrupt onset of vomiting.  Patient reports vomiting about 20 times in a 2-day.  Followed by new onset of diarrhea 5 times a day.  Without any bloody emesis or bright red blood per rectum.  Without any abdominal pain.  Patient does report subjective fever but these were not documented.  Patient denies any dysuria back pain or any trauma.     Assessment and Plan:   * AKI (acute kidney injury) (HCC) -Resolved, creatinine down to 1.14 - Likely at baseline   Hypothyroidism C/w synthroid   Gastroenteritis -Resolved, patient tolerating diet well in the hospital -No more nausea vomiting or diarrhea in the hospital -Patient had refused CT scan abdomen in the hospital  Schizoaffective disorder (HCC) Continue home medications  Hyperlipemia Continue with Crestor .  Patient takes it every other day.  Hypertension Continue home regimen  Atrial fibrillation with RVR (HCC) Is chronic, status post ablation as per the patient.  Continue with Cardizem , apixaban   Hypokalemia - Potassium is 3.4 - Will replace calcium  before discharge       Consultants:  Procedures performed:  Disposition: Home Diet  recommendation:  Discharge Diet Orders (From admission, onward)     Start     Ordered   03/16/24 0000  Diet - low sodium heart healthy        03/16/24 1233           Heart healthy diet DISCHARGE MEDICATION: Allergies as of 03/16/2024       Reactions   Sulfa Antibiotics Rash, Other (See Comments)   Childhood allergy   Ciprocinonide [fluocinolone] Rash   Ciprofibrate Rash   Ciprofloxacin Rash        Medication List     TAKE these medications    acyclovir  400 MG tablet Commonly known as: ZOVIRAX  Take 400 mg at bedtime by mouth.   azelastine  0.05 % ophthalmic solution Commonly known as: OPTIVAR  Place 2 drops into both eyes daily as needed. Inching eyes   b complex vitamins capsule Take 1 capsule by mouth daily.   cholecalciferol 25 MCG (1000 UNIT) tablet Commonly known as: VITAMIN D3 Take 1,000 Units by mouth daily.   Co Q-10 100 MG Caps Take 100 mg by mouth daily.   cyanocobalamin 1000 MCG tablet Commonly known as: VITAMIN B12 Take 1,000 mcg by mouth daily.   diltiazem  180 MG 24 hr capsule Commonly known as: CARDIZEM  CD Take 180 mg by mouth daily.   donepezil  10 MG tablet Commonly known as: ARICEPT  TAKE 1 TABLET BY MOUTH EVERYDAY AT BEDTIME   Eliquis  2.5 MG Tabs tablet Generic drug: apixaban  TAKE 1 TABLET BY MOUTH TWICE A DAY   famotidine 20 MG tablet Commonly known as:  PEPCID Take 20 mg by mouth daily.   Fanapt  4 MG Tabs tablet Generic drug: iloperidone  Take 1 tablet (4 mg total) by mouth at bedtime. What changed: how much to take   ferrous sulfate 324 MG Tbec Take 324 mg by mouth daily with breakfast.   Fish Oil  Burp-Less 1000 MG Caps Take 3,000 mg by mouth 2 (two) times daily.   fluticasone  50 MCG/ACT nasal spray Commonly known as: FLONASE  Place 2 sprays into both nostrils daily.   Ginkgo Biloba 120 MG Tabs Take 240 mg by mouth daily.   lamoTRIgine  100 MG tablet Commonly known as: LAMICTAL  TAKE 1 TABLET BY MOUTH EVERY DAY    levothyroxine  100 MCG tablet Commonly known as: SYNTHROID  Take 100 mcg by mouth daily before breakfast.   LORazepam  0.5 MG tablet Commonly known as: ATIVAN  TAKE 1 TABLET BY MOUTH AT BEDTIME.   MAGNESIUM  PO Take 250 mg by mouth every evening.   melatonin 3 MG Tabs tablet Take 3 mg by mouth at bedtime.   multivitamin tablet Take 1 tablet by mouth daily.   nitrofurantoin 100 MG capsule Commonly known as: MACRODANTIN Take 100 mg by mouth 2 (two) times daily.   oxybutynin  15 MG 24 hr tablet Commonly known as: DITROPAN  XL Take 15 mg by mouth daily.   rosuvastatin  10 MG tablet Commonly known as: CRESTOR  TAKE 1 TABLET BY MOUTH UP TO 4 TIMES A WEEK ON SUNDAY, MONDAY,WEDNESDAY AND FRIDAY   sodium chloride  0.65 % Soln nasal spray Commonly known as: OCEAN Place 2 sprays into both nostrils every evening.   Turmeric 500 MG Caps Take 500 mg by mouth daily.   vitamin C  1000 MG tablet Take 1,000 mg by mouth daily.   ziprasidone  40 MG capsule Commonly known as: GEODON  TAKE 1 CAPSULE BY MOUTH AT BEDTIME.        Follow-up Information     [facility]. Call.   Specialty: Home Health Services Why: Will call to schedule times. Contact information: [address] [place] [address] [phone]                Discharge Exam: Cleavon Curls Weights   03/15/24 1055  Weight: 64 kg   General-appears in no acute distress Heart-S1-S2, regular, no murmur auscultated Lungs-clear to auscultation bilaterally, no wheezing or crackles auscultated Abdomen-soft, nontender, no organomegaly Extremities-no edema in the lower extremities Neuro-alert, oriented x3, no focal deficit noted  Condition at discharge: good  The results of significant diagnostics from this hospitalization (including imaging, microbiology, ancillary and laboratory) are listed below for reference.   Imaging Studies: No results found.  Microbiology: Results for orders placed or performed in  visit on 08/13/21  SARS Coronavirus 2 (TAT 6-24 hrs)     Status: None   Collection Time: 08/13/21 12:00 AM  Result Value Ref Range Status   SARS Coronavirus 2 RESULT: NEGATIVE  Final    Comment: RESULT: NEGATIVESARS-CoV-2 INTERPRETATION:A NEGATIVE  test result means that SARS-CoV-2 RNA was not present in the specimen above the limit of detection of this test. This does not preclude a possible SARS-CoV-2 infection and should not be used as the  sole basis for patient management decisions. Negative results must be combined with clinical observations, patient history, and epidemiological information. Optimum specimen types and timing for peak viral levels during infections caused by SARS-CoV-2  have not been determined. Collection of multiple specimens or types of specimens may be necessary to detect virus. Improper specimen collection and handling, sequence variability under  primers/probes, or organism present below the limit of detection may  lead to false negative results. Positive and negative predictive values of testing are highly dependent on prevalence. False negative test results are more likely when prevalence of disease is high.The expected result is NEGATIVE.Fact S heet for  Healthcare Providers: [employer] Sheet for Patients: https://poole-freeman.org/ Reference Range - Negative     Labs: CBC: Recent Labs  Lab 03/15/24 1215 03/16/24 0414  WBC 8.9 6.6  NEUTROABS 7.1  --   HGB 17.0* 12.8  HCT 53.1* 40.2  MCV 99.6 100.8*  PLT 187 164   Basic Metabolic Panel: Recent Labs  Lab 03/15/24 1215 03/16/24 0414  NA 137 139  K 4.1 3.4*  CL 101 108  CO2 22 23  GLUCOSE 114* 90  BUN 50* 38*  CREATININE 1.74* 1.14*  CALCIUM  9.4 8.3*  MG 2.7*  --    Liver Function Tests: Recent Labs  Lab 03/15/24 1215  AST 33  ALT 19  ALKPHOS 71  BILITOT 1.0  PROT 8.8*  ALBUMIN  4.4   CBG: No results for input(s): "GLUCAP" in the last  168 hours.  Discharge time spent: greater than 30 minutes.  Signed: Ozell Blunt, MD [facility] 03/16/2024

## 2024-03-16 NOTE — ED Triage Notes (Signed)
 Pt bibgcems from home pt fell at home and hit her head. Swelling on left shoulder from fall. Pt on eloquis. No loc. 144/76 84 hr  95% ra  100.8 temp

## 2024-03-16 NOTE — TOC Initial Note (Addendum)
 Transition of Care [facility]) - Initial/Assessment Note    Patient Details  Name: Kaitlyn Hoover MRN: [id number] Date of Birth: 05/26/1941  [facility] CM/SW Contact:    Kathryn Parish, RN Phone Number: 03/16/2024, 11:26 AM  Clinical Narrative:                 NCM spoke with pt in room. Pt states PTA independent,lives in house with brother Lovena Rubinstein) drives self; verified PCP(Steven Rogetio, [facility]) and insurance; DME(cane,walker,[facility],[place]), No HH, oxygen, SDOH; pt's friend Gentry Kief [phone]) will transport home at discharge. TOC will follow progression to discharge.  Expected Discharge Plan: Home/Self Care Barriers to Discharge: Continued Medical Work up   Patient Goals and CMS Choice Patient states their goals for this hospitalization and ongoing recovery are:: Home   Choice offered to / list presented to : NA      Expected Discharge Plan and Services In-house Referral: NA Discharge Planning Services: CM Consult Post Acute Care Choice: NA Living arrangements for the past 2 months: Single Family Home                 DME Arranged: N/A DME Agency: NA       HH Arranged: NA HH Agency: NA        Prior Living Arrangements/Services Living arrangements for the past 2 months: Single Family Home Lives with:: Siblings Patient language and need for interpreter reviewed:: Yes Do you feel safe going back to the place where you live?: Yes      Need for Family Participation in Patient Care: Yes (Comment) Care giver support system in place?: Yes (comment) Current home services: DME (cane, walker, [facility], [facility],) Criminal Activity/Legal Involvement Pertinent to Current Situation/Hospitalization: No - Comment as needed  Activities of Daily Living   ADL Screening (condition at time of admission) Independently performs ADLs?: Yes (appropriate for developmental age) Is the patient deaf or have difficulty hearing?: Yes Does the patient have difficulty seeing, even  when wearing glasses/contacts?: Yes Does the patient have difficulty concentrating, remembering, or making decisions?: No  Permission Sought/Granted Permission sought to share information with : Case Manager Permission granted to share information with : Yes, Verbal Permission Granted  Share Information with NAME: Gentry Kief     Permission granted to share info w Relationship: Friend  Permission granted to share info w Contact Information: [phone]  Emotional Assessment Appearance:: Appears stated age Attitude/Demeanor/Rapport: Engaged Affect (typically observed): Accepting Orientation: : Oriented to Self, Oriented to Place, Oriented to  Time, Oriented to Situation Alcohol / Substance Use: Not Applicable Psych Involvement: No (comment)  Admission diagnosis:  Dehydration [E86.0] AKI (acute kidney injury) (HCC) [N17.9] Cystitis [N30.90] Patient Active Problem List   Diagnosis Date Noted   AKI (acute kidney injury) (HCC) 03/15/2024   Gastroenteritis 03/15/2024   Hypothyroidism 03/15/2024   Atrial flutter (HCC) 08/19/2023   Colon cancer (HCC) 08/15/2021   Chronic midline back pain 03/24/2019   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/2018   Schizoaffective disorder (HCC) 09/15/2018   Atrial fibrillation (HCC) 04/25/2017   Hyperlipidemia 03/11/2017   Surgery, elective 08/02/2016   Spondylolisthesis, lumbar region 07/31/2016   Hyperlipemia 04/12/2016   Gait disturbance 01/26/2016   Spinal stenosis of lumbar region 01/26/2016   Progressive focal motor weakness 01/12/2016   Muscle atrophy 01/12/2016   Urinary urgency 01/12/2016   Encounter for long-term (current) use of high-risk medication 03/03/2015   Essential hypertension 03/03/2015   Atrial fibrillation with RVR (HCC) 04/18/2014  Palpitations 04/18/2014   Post-polio syndrome 04/18/2014   Hypertension    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 AT CORNER OF [facility] [address] [place] [address] Phone: [phone] Fax: [phone]  [facility] - [address], [place] - [address] [place] [address] Phone: [phone] Fax: [phone]  Melodee Spruce [facility] - [facility] [address] [place] [address] Phone: [phone] Fax: [phone]     Social Drivers of Health (SDOH) Social History: SDOH Screenings   Food Insecurity: No Food Insecurity (03/15/2024)  Housing: Low Risk  (03/15/2024)  Transportation Needs: No Transportation Needs (03/15/2024)  Utilities: Not At Risk (03/15/2024)  Social Connections: Moderately Isolated (03/15/2024)  Tobacco Use: Medium Risk (03/15/2024)   SDOH Interventions:     Readmission Risk Interventions     No data to display

## 2024-03-16 NOTE — TOC Transition Note (Signed)
 Transition of Care [facility]) - Discharge Note   Patient Details  Name: Kaitlyn Hoover MRN: [id number] Date of Birth: [birth year]  [facility]) CM/SW Contact:  Kathryn Parish, RN Phone Number: 03/16/2024, 1:33 PM   Clinical Narrative:    CODE 44 completed. Pt discharge home with [facility] PT with [facility]. Pt friend (Faith) to transport home. TOC signing off     Barriers to Discharge: Barriers Resolved   Patient Goals and CMS Choice Patient states their goals for this hospitalization and ongoing recovery are:: Home CMS Medicare.gov Compare Post Acute Care list provided to:: Patient Choice offered to / list presented to : Patient Grants ownership interest in [facility].provided to:: Parent NA    Discharge Placement                       Discharge Plan and Services Additional resources added to the After Visit Summary for   In-house Referral: NA Discharge Planning Services: CM Consult Post Acute Care Choice: NA          DME Arranged: N/A DME Agency: NA       HH Arranged: PT HH Agency: [facility] Date [facility] Agency Contacted: 03/16/24 Time HH Agency Contacted: 1245    Social Drivers of Health (SDOH) Interventions SDOH Screenings   Food Insecurity: No Food Insecurity (03/15/2024)  Housing: Low Risk  (03/15/2024)  Transportation Needs: No Transportation Needs (03/15/2024)  Utilities: Not At Risk (03/15/2024)  Social Connections: Moderately Isolated (03/15/2024)  Tobacco Use: Medium Risk (03/15/2024)     Readmission Risk Interventions     No data to display

## 2024-03-16 NOTE — ED Notes (Signed)
 Patient transported to CT

## 2024-03-16 NOTE — Care Management CC44 (Signed)
 Condition Code 44 Documentation Completed  Patient Details  Name: RAKISHA HEE MRN: [id number] Date of Birth: July 21, 1941   Condition Code 44 given:  Yes Patient signature on Condition Code 44 notice:  Yes Documentation of 2 MD's agreement:  Yes Code 44 added to claim:  Yes    Kathryn Parish, RN 03/16/2024, 1:24 PM

## 2024-03-16 NOTE — Discharge Instructions (Signed)
 Follow-up PCP in 1 week, follow-up urine cultures as outpatient at PCP office.

## 2024-03-16 NOTE — ED Provider Notes (Incomplete)
 [facility] DE[facility] Emergency Department Provider Note MRN:  [id number]  Arrival date & time: 03/17/24     Chief Complaint   Fall   History of Present Illness   Kaitlyn Hoover is a 83 y.o. year-old female with a history of A-fib presenting to the ED with chief complaint of fall.  Patient was walking around the corner and her foot was caught on something in the hallway and she fell.  Hit her head, hit the back of her neck, complaining of neck pain upper back pain lower back pain.  Denies chest pain or shortness of breath, no abdominal pain, no injuries to the arms or legs.  No numbness or weakness to the arms or legs, no loss of consciousness.  Review of Systems  A thorough review of systems was obtained and all systems are negative except as noted in the HPI and PMH.   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    Past Surgical History:  Procedure Laterality Date   ABDOMINAL HYSTERECTOMY     ATRIAL FIBRILLATION ABLATION N/A 04/25/2017   Procedure: Atrial Fibrillation Ablation;  Surgeon: Lei Pump, MD;  Location: [facility] INVASIVE CV LAB;  Service: Cardiovascular;  Laterality: N/A;   BREAST EXCISIONAL BIOPSY Bilateral    benign   CARDIOVERSION N/A 08/06/2023   Procedure: CARDIOVERSION;  Surgeon: Loyde Rule, MD;  Location: [facility] INVAS[facility] CV LAB;  Service: Cardiovascular;  Laterality: N/A;   CATARACT EXTRACTION W/ INTRAOCULAR LENS  IMPLANT, BILATERAL     CERVICAL LAMINECTOMY     CESAREAN SECTION     X2    LEGS      AGE 13-11   SEVERAL SURGERIES FOR POLIO    SVT ABLATION N/A 11/26/2017   Procedure: SVT ABLATION;   Surgeon: Lei Pump, MD;  Location: [facility] INVASIVE CV LAB;  Service: Cardiovascular;  Laterality: N/A;   THYROIDECTOMY      Family History  Problem Relation Age of Onset   Hypertension Mother    Cancer Father        JAW BONE   COPD Brother    Atrial fibrillation Brother     Social History   Socioeconomic History   Marital status: Divorced    Spouse name: Not on file   Number of children: Not on file   Years of education: Not on file   Highest education level: Not on file  Occupational History   Not on file  Tobacco Use   Smoking status: Former    Current packs/day: 0.00    Average packs/day: 1 pack/day for 25.0 years (25.0 ttl pk-yrs)    Types: Cigarettes    Start date: 73    Quit date: 36    Years since quitting: 27.3   Smokeless tobacco: Never   Tobacco comments:    QUIT SMOKING IN 1998  Vaping Use   Vaping status: Never Used  Substance and Sexual Activity   Alcohol use: Not Currently   Drug use: No   Sexual activity: Not on file  Other Topics Concern   Not on file  Social History Narrative   Not on file   Social Drivers of Health   Financial Resource Strain: Not on file  Food Insecurity: No Food Insecurity (03/15/2024)   Hunger Vital Sign    Worried About Running Out of Food in the Last Year: Never true    Ran Out of Food in the Last Year: Never true  Transportation Needs: No Transportation Needs (03/15/2024)   PRAPARE - Administrator, Civil Service (Medical): No    Lack of Transportation (Non-Medical): No  Physical Activity: Not on file  Stress: Not on file  Social Connections: Moderately Isolated (03/15/2024)   Social Connection and Isolation Panel [NHANES]    Frequency of Communication with Friends and Family: Three times a week    Frequency of Social Gatherings with Friends and Family: Once a week    Attends Religious Services: 1 to 4 times per year    Active Member of [employer] or Organizations: No    Attends Banker  Meetings: Never    Marital Status: Divorced  Catering manager Violence: Not At Risk (03/15/2024)   Humiliation, Afraid, Rape, and Kick questionnaire    Fear of Current or Ex-Partner: No    Emotionally Abused: No    Physically Abused: No    Sexually Abused: No     Physical Exam   Vitals:   03/17/24 0045 03/17/24 0130  BP: (!) 127/46 (!) 127/42  Pulse: 66 67  Resp: 20   SpO2: 94% 96%    CONSTITUTIONAL: Well-appearing, NAD NEURO/PSYCH:  Alert and oriented x 3, no focal deficits EYES:  eyes equal and reactive ENT/NECK:  no LAD, no JVD CARDIO: Regular rate, well-perfused, normal S1 and S2 PULM:  CTAB no wheezing or rhonchi GI/GU:  non-distended, non-tender MSK/SPINE:  No gross deformities, no edema SKIN:  no rash, atraumatic   *Additional and/or pertinent findings included in MDM below  Diagnostic and Interventional Summary    EKG Interpretation Date/Time:  Tuesday March 16 2024 23:32:49 EDT Ventricular Rate:  79 PR Interval:  157 QRS Duration:  93 QT Interval:  411 QTC Calculation: 472 R Axis:   45  Text Interpretation: Sinus rhythm Borderline low voltage, extremity leads Confirmed by Gwenetta Lennert [phone]) on 03/16/2024 11:56:44 PM       Labs Reviewed  COMPREHENSIVE METABOLIC PANEL WITH GFR - Abnormal; Notable for the following components:      Result Value   CO2 17 (*)    Glucose, Bld 157 (*)    BUN 25 (*)    Creatinine, Ser 1.16 (*)    Calcium  8.5 (*)    Total Protein 6.2 (*)    Albumin  3.1 (*)    AST 805 (*)    ALT 438 (*)    Alkaline Phosphatase 132 (*)    Total Bilirubin 3.0 (*)    GFR, Estimated 47 (*)    All other components within normal limits  PROTIME-INR - Abnormal; Notable for the following components:   Prothrombin Time 15.4 (*)    All other components within normal limits  I-STAT CHEM 8, ED - Abnormal; Notable for the following components:   BUN 26 (*)    Creatinine, Ser 1.20 (*)    Glucose, Bld 157 (*)    Calcium , Ion 1.07 (*)    TCO2  18 (*)    All other components within normal limits  I-STAT CG4 LACTIC ACID, ED - Abnormal; Notable for the following  components:   Lactic Acid, Venous 2.1 (*)    All other components within normal limits  CBC  ETHANOL  SAMPLE TO BLOOD BANK    CT CERVICAL SPINE WO CONTRAST  Final Result    CT HEAD WO CONTRAST  Final Result    CT Thoracic Spine Wo Contrast  Final Result    CT Lumbar Spine Wo Contrast  Final Result    DG Chest Port 1 View  Final Result      Medications - No data to display   Procedures  /  Critical Care .Critical Care  Performed by: Edson Graces, MD Authorized by: Edson Graces, MD   Critical care provider statement:    Critical care time (minutes):  30   Critical care was necessary to treat or prevent imminent or life-threatening deterioration of the following conditions:  Trauma   Critical care was time spent personally by me on the following activities:  Development of treatment plan with patient or surrogate, discussions with consultants, evaluation of patient's response to treatment, examination of patient, ordering and review of laboratory studies, ordering and review of radiographic studies, ordering and performing treatments and interventions, pulse oximetry, re-evaluation of patient's condition and review of old charts   ED Course and Medical Decision Making  Initial Impression and Ddx Level 2 trauma fall on thinners minimal evidence of trauma on exam, reassuring vital signs, endorsing some neck and back pain, did have head trauma.  Awaiting imaging.  Past medical/surgical history that increases complexity of ED encounter: Anticoagulated, history of A-fib  Interpretation of Diagnostics I personally reviewed the EKG and my interpretation is as follows: Sinus rhythm  No significant blood count or electrolyte disturbance.  Mild acidosis  Patient Reassessment and Ultimate Disposition/Management     Imaging without significant traumatic  injury, patient feels well, appropriate for discharge.  Patient management required discussion with the following services or consulting groups:  None  Complexity of Problems Addressed Acute illness or injury that poses threat of life of bodily function  Additional Data Reviewed and Analyzed Further history obtained from: EMS on arrival  Additional Factors Impacting ED Encounter Risk Consideration of hospitalization  Merrick Abe. Harless Lien, MD [facility] Emergency Medicine [facility] [email or web address]  Final Clinical Impressions(s) / ED Diagnoses     ICD-10-CM   1. Fall, initial encounter  W19.XXXA     2. Anticoagulated  Z79.01       ED Discharge Orders     None        Discharge Instructions Discussed with and Provided to Patient:     Discharge Instructions      You were evaluated in the Emergency Department and after careful evaluation, we did not find any emergent condition requiring admission or further testing in the hospital.  Your exam/testing today was overall reassuring.  CT scans did not show any significant injuries.  Can use Tylenol  at home for any lingering discomfort.  Please return to the Emergency Department if you experience any worsening of your condition.  Thank you for allowing us  to be a part of your care.        Edson Graces, MD 03/17/24 1610    Edson Graces, MD 03/17/24 [phone]

## 2024-03-16 NOTE — Care Management Obs Status (Signed)
 MEDICARE OBSERVATION STATUS NOTIFICATION   Patient Details  Name: Kaitlyn Hoover MRN: [id number] Date of Birth: 08-22-1941   Medicare Observation Status Notification Given:  Yes    Kathryn Parish, RN 03/16/2024, 1:23 PM

## 2024-03-17 ENCOUNTER — Other Ambulatory Visit: Payer: Self-pay

## 2024-03-17 ENCOUNTER — Encounter (HOSPITAL_COMMUNITY): Payer: Self-pay

## 2024-03-17 LAB — COMPREHENSIVE METABOLIC PANEL WITH GFR
ALT: 438 U/L — ABNORMAL HIGH (ref 0–44)
AST: 805 U/L — ABNORMAL HIGH (ref 15–41)
Albumin: 3.1 g/dL — ABNORMAL LOW (ref 3.5–5.0)
Alkaline Phosphatase: 132 U/L — ABNORMAL HIGH (ref 38–126)
Anion gap: 11 (ref 5–15)
BUN: 25 mg/dL — ABNORMAL HIGH (ref 8–23)
CO2: 17 mmol/L — ABNORMAL LOW (ref 22–32)
Calcium: 8.5 mg/dL — ABNORMAL LOW (ref 8.9–10.3)
Chloride: 108 mmol/L (ref 98–111)
Creatinine, Ser: 1.16 mg/dL — ABNORMAL HIGH (ref 0.44–1.00)
GFR, Estimated: 47 mL/min — ABNORMAL LOW (ref >60)
Glucose, Bld: 157 mg/dL — ABNORMAL HIGH (ref 70–99)
Potassium: 3.8 mmol/L (ref 3.5–5.1)
Sodium: 136 mmol/L (ref 135–145)
Total Bilirubin: 3 mg/dL — ABNORMAL HIGH (ref 0.0–1.2)
Total Protein: 6.2 g/dL — ABNORMAL LOW (ref 6.5–8.1)

## 2024-03-17 LAB — CBC
HCT: 41 % (ref 36.0–46.0)
Hemoglobin: 13.3 g/dL (ref 12.0–15.0)
MCH: 32 pg (ref 26.0–34.0)
MCHC: 32.4 g/dL (ref 30.0–36.0)
MCV: 98.6 fL (ref 80.0–100.0)
Platelets: 179 10*3/uL (ref 150–400)
RBC: 4.16 MIL/uL (ref 3.87–5.11)
RDW: 12.9 % (ref 11.5–15.5)
WBC: 7.8 10*3/uL (ref 4.0–10.5)
nRBC: 0 % (ref 0.0–0.2)

## 2024-03-17 LAB — ETHANOL: Alcohol, Ethyl (B): <15 mg/dL (ref <15)

## 2024-03-17 NOTE — Discharge Instructions (Signed)
 You were evaluated in the Emergency Department and after careful evaluation, we did not find any emergent condition requiring admission or further testing in the hospital.  Your exam/testing today was overall reassuring.  CT scans did not show any significant injuries.  Can use Tylenol  at home for any lingering discomfort.  Please return to the Emergency Department if you experience any worsening of your condition.  Thank you for allowing us  to be a part of your care.

## 2024-03-17 NOTE — Progress Notes (Signed)
 Orthopedic Tech Progress Note Patient Details:  Kaitlyn Hoover March 02, 1941 [id number]  Patient ID: [name], female   DOB: Jul 12, 1941, 83 y.o.   MRN: [id number] Level II; not currently needed. [name] 03/17/2024, 12:38 AM

## 2024-03-18 ENCOUNTER — Other Ambulatory Visit: Payer: Self-pay

## 2024-03-18 DIAGNOSIS — Z1231 Encounter for screening mammogram for malignant neoplasm of breast: Secondary | ICD-10-CM

## 2024-03-20 ENCOUNTER — Other Ambulatory Visit: Payer: Self-pay | Admitting: Psychiatry

## 2024-03-20 DIAGNOSIS — F250 Schizoaffective disorder, bipolar type: Secondary | ICD-10-CM

## 2024-03-23 ENCOUNTER — Other Ambulatory Visit: Payer: PPO | Admitting: Oncology

## 2024-03-23 ENCOUNTER — Inpatient Hospital Stay: Payer: PPO | Attending: Oncology

## 2024-03-23 ENCOUNTER — Ambulatory Visit (HOSPITAL_BASED_OUTPATIENT_CLINIC_OR_DEPARTMENT_OTHER)
Admission: RE | Admit: 2024-03-23 | Discharge: 2024-03-23 | Disposition: A | Discharge status: Home / Self Care | Payer: PPO | Source: Ambulatory Visit | Attending: Oncology | Admitting: Oncology

## 2024-03-23 DIAGNOSIS — Z860100 Personal history of colon polyps, unspecified: Secondary | ICD-10-CM | POA: Insufficient documentation

## 2024-03-23 DIAGNOSIS — C184 Malignant neoplasm of transverse colon: Secondary | ICD-10-CM | POA: Insufficient documentation

## 2024-03-23 DIAGNOSIS — Z9221 Personal history of antineoplastic chemotherapy: Secondary | ICD-10-CM | POA: Diagnosis not present

## 2024-03-23 DIAGNOSIS — Z7901 Long term (current) use of anticoagulants: Secondary | ICD-10-CM | POA: Diagnosis not present

## 2024-03-23 DIAGNOSIS — I4891 Unspecified atrial fibrillation: Secondary | ICD-10-CM | POA: Insufficient documentation

## 2024-03-23 DIAGNOSIS — Z85038 Personal history of other malignant neoplasm of large intestine: Secondary | ICD-10-CM | POA: Insufficient documentation

## 2024-03-23 DIAGNOSIS — K862 Cyst of pancreas: Secondary | ICD-10-CM | POA: Insufficient documentation

## 2024-03-24 ENCOUNTER — Ambulatory Visit
Admission: RE | Admit: 2024-03-24 | Discharge: 2024-03-24 | Disposition: A | Discharge status: Home / Self Care | Source: Ambulatory Visit

## 2024-03-24 DIAGNOSIS — Z1231 Encounter for screening mammogram for malignant neoplasm of breast: Secondary | ICD-10-CM

## 2024-03-25 ENCOUNTER — Inpatient Hospital Stay

## 2024-03-25 ENCOUNTER — Telehealth: Payer: Self-pay | Admitting: Oncology

## 2024-03-25 ENCOUNTER — Other Ambulatory Visit: Payer: Self-pay | Admitting: *Deleted

## 2024-03-25 ENCOUNTER — Inpatient Hospital Stay: Payer: PPO | Admitting: Oncology

## 2024-03-25 VITALS — BP 134/67 | HR 76 | Temp 98.2°F | Resp 18 | Ht 62.0 in | Wt 141.7 lb

## 2024-03-25 DIAGNOSIS — C184 Malignant neoplasm of transverse colon: Secondary | ICD-10-CM

## 2024-03-25 LAB — CMP (CANCER CENTER ONLY)
ALT: 64 U/L — ABNORMAL HIGH (ref 0–44)
AST: 32 U/L (ref 15–41)
Albumin: 4.2 g/dL (ref 3.5–5.0)
Alkaline Phosphatase: 123 U/L (ref 38–126)
Anion gap: 14 (ref 5–15)
BUN: 11 mg/dL (ref 8–23)
CO2: 24 mmol/L (ref 22–32)
Calcium: 9.4 mg/dL (ref 8.9–10.3)
Chloride: 108 mmol/L (ref 98–111)
Creatinine: 1.28 mg/dL — ABNORMAL HIGH (ref 0.44–1.00)
GFR, Estimated: 42 mL/min — ABNORMAL LOW (ref >60)
Glucose, Bld: 94 mg/dL (ref 70–99)
Potassium: 4.3 mmol/L (ref 3.5–5.1)
Sodium: 146 mmol/L — ABNORMAL HIGH (ref 135–145)
Total Bilirubin: 0.6 mg/dL (ref 0.0–1.2)
Total Protein: 6.3 g/dL — ABNORMAL LOW (ref 6.5–8.1)

## 2024-03-25 LAB — CEA (ACCESS): CEA (CHCC): 8.63 ng/mL — ABNORMAL HIGH (ref 0.00–5.00)

## 2024-03-25 NOTE — Progress Notes (Signed)
 Per Dr. Scherrie Curt request: added CMP to lab today to f/u on liver enzyme elevations from 03/16/24 noted in hospital. Notified nurse navigator to add patient to GI conference in next 2 weeks radiology only to evaluate pancreatic cyst.

## 2024-03-25 NOTE — Telephone Encounter (Signed)
 Patient has been scheduled for follow-up visit per 03/25/24 LOS.  Pt aware of scheduled appt details.

## 2024-03-25 NOTE — Telephone Encounter (Signed)
 SPOKE WITH PATIENT ABOUT UPCOMING APPOINTMENT.

## 2024-03-25 NOTE — Progress Notes (Signed)
 [facility] OFFICE PROGRESS NOTE   Diagnosis: Colon cancer  INTERVAL HISTORY:   Kaitlyn Hoover returns as scheduled.  She reports a good appetite.  No bleeding.  She was admitted a few weeks ago with gastroenteritis.  She was seen in the emergency room 03/16/2024 after a fall.  A CT head and CTs of the spine revealed no acute injury.  Objective:  Vital signs in last 24 hours:  Blood pressure 134/67, pulse 76, temperature 98.2 F (36.8 C), temperature source Temporal, resp. rate 18, height 5\' 2"  (1.575 m), weight 141 lb 11.2 oz (64.3 kg), SpO2 98%.  Lymphatics: No cervical, supraclavicular, axillary, or inguinal nodes Resp: Lungs clear bilaterally Cardio: Regular rate and rhythm GI: No hepatosplenomegaly, no mass, nontender Vascular: No leg edema   Lab Results:  Lab Results  Component Value Date   WBC 7.8 03/16/2024   HGB 12.9 03/16/2024   HCT 38.0 03/16/2024   MCV 98.6 03/16/2024   PLT 179 03/16/2024   NEUTROABS 7.1 03/15/2024    CMP  Lab Results  Component Value Date   NA 138 03/16/2024   K 3.8 03/16/2024   CL 108 03/16/2024   CO2 17 (L) 03/16/2024   GLUCOSE 157 (H) 03/16/2024   BUN 26 (H) 03/16/2024   CREATININE 1.20 (H) 03/16/2024   CALCIUM  8.5 (L) 03/16/2024   PROT 6.2 (L) 03/16/2024   ALBUMIN  3.1 (L) 03/16/2024   AST 805 (H) 03/16/2024   ALT 438 (H) 03/16/2024   ALKPHOS 132 (H) 03/16/2024   BILITOT 3.0 (H) 03/16/2024   GFRNONAA 47 (L) 03/16/2024   GFRAA 53 (L) 11/02/2020    Lab Results  Component Value Date   CEA1 5.4 (H) 08/06/2021   CEA 7.46 (H) 11/04/2023    Lab Results  Component Value Date   INR 1.2 03/16/2024   LABPROT 15.4 (H) 03/16/2024    Imaging:  CT CHEST ABDOMEN PELVIS WO CONTRAST Result Date: 03/23/2024 CLINICAL DATA:  History of colon cancer, surveillance. * Tracking Code: BO * EXAM: CT CHEST, ABDOMEN AND PELVIS WITHOUT CONTRAST TECHNIQUE: Multidetector CT imaging of the chest, abdomen and pelvis was performed  following the standard protocol without IV contrast. RADIATION DOSE REDUCTION: This exam was performed according to the departmental dose-optimization program which includes automated exposure control, adjustment of the mA and/or kV according to patient size and/or use of iterative reconstruction technique. COMPARISON:  Multiple priors including CT Apr 10, 2023 FINDINGS: CT CHEST FINDINGS Cardiovascular: Aortic atherosclerosis. Normal size heart. Calcifications of the mitral annulus. 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 Mediastinum/Nodes: Thyroid  gland is atrophic or surgically absent. No suspicious thyroid  nodule. No pathologically enlarged mediastinal, hilar or axillary lymph nodes. Retained versus refluxed contrast in the esophagus. Lungs/Pleura: Biapical pleuroparenchymal scarring. Stable scattered tiny pulmonary nodules for instance a 2 mm nodule in the dependent left lower lobe on image 69/3. No new suspicious pulmonary nodules or masses. Scattered atelectasis/scarring. Musculoskeletal: No aggressive lytic or blastic lesion of bone. Multilevel degenerative changes spine. Degenerative changes bilateral shoulders. CT ABDOMEN PELVIS FINDINGS Hepatobiliary: No suspicious hepatic lesion on noncontrast enhanced examination. Gallbladder is unremarkable. No biliary ductal dilation. Pancreas: No pancreatic ductal dilation or evidence of acute inflammation. Increased size of the cyst in the pancreatic body/tail now measuring 18 mm on image 59/2 previously 10 mm. Spleen: No splenomegaly. Adrenals/Urinary Tract: Bilateral adrenal glands appear normal. No hydronephrosis. Stable exophytic large left renal cyst which is considered benign requires no specific imaging  follow-up. Urinary bladder is minimally distended. Stomach/Bowel: Radiopaque enteric contrast material traverses the rectum. Stomach is unremarkable for degree of distension. No pathologic dilation of  small or large bowel. Colonic diverticulosis. Surgical changes of prior right hemicolectomy with ileocolic anastomosis. No suspicious nodularity along the suture line. Similar fat necrosis in the anterior omentum on image 99/2. Vascular/Lymphatic: Aortic atherosclerosis. Normal caliber abdominal aorta. No pathologically enlarged abdominal or pelvic lymph nodes. Reproductive: Status post hysterectomy. No adnexal masses. Other: Postsurgical change in the anterior abdominal wall. No significant abdominopelvic free fluid. Musculoskeletal: No aggressive lytic or blastic lesion of bone. Lumbar spine posterior fusion hardware. Multilevel degenerative change of the spine. Transitional lumbosacral anatomy. IMPRESSION: 1. Surgical changes of prior right hemicolectomy with ileocolic anastomosis. No evidence of local recurrence or metastatic disease in the chest, abdomen or pelvis. 2. Increased size of the cyst in the pancreatic body/tail now measuring 18 mm previously 10 mm. Consider further evaluation by pancreatic protocol MRI with and without contrast. 3. Stable scattered tiny pulmonary nodules, favored benign. 4. Colonic diverticulosis without evidence of acute diverticulitis. Electronically Signed   By: Tama Fails M.D.   On: 03/23/2024 14:28    Medications: I have reviewed the patient's current medications.   Assessment/Plan:  Colon cancer, hepatic flexure, stage IIIb (pT4a,pN1b), status post a right colectomy 08/15/2021 Tumor invades through the serosa, 3/25 lymph nodes, MSS, no loss of mismatch repair protein expression CTs 06/26/2021-eccentric wall thickening of the hepatic flexure, tiny pulmonary nodules measuring up to 2 mm-likely infectious or inflammatory Elevated preoperative CEA Cycle 1 Xeloda  09/10/2021 Cycle 2 Xeloda  10/01/2021 Cycle 3 Xeloda  10/22/2021 Cycle 4 Xeloda  11/12/2021-last dose 11/16/2021, hospitalized in [place]  11/18/2021 with small bowel obstruction Cycle 5 Xeloda  12/17/2021 Cycle 6  Xeloda  01/07/2022 Cycle 7 Xeloda  01/28/2022 Cycle 8 Xeloda  02/18/2022 Mild elevation of the CEA CTs 03/19/2022-no evidence of metastatic disease, unchanged tiny pulmonary nodules-likely benign Colonoscopy 09/03/2022-negative CTs 04/10/2023-no evidence of metastatic disease; again noted small scattered subpleural nodules lungs bilaterally, upper lobe predominant, measuring 2 to 3 mm, not considered suspicious for metastatic disease.  No new/suspicious pulmonary nodules. CTs 03/23/2024: No evidence of recurrent disease, increased size of a cyst in the pancreas body/tail, scattered tiny pulmonary nodules favored benign History of colon polyps including on the right colectomy specimen 08/15/2021, negative colonoscopy 09/03/2022 Bipolar disease Atrial fibrillation maintained on anticoagulation Subcutaneous lesion right lower chest inferior to the breast-possibly a lipoma Admission to the hospital in [place]  11/18/2021 with nausea/vomiting/diarrhea.  Diagnosed with small bowel obstruction.  Discharged home 11/25/2021. Small bilateral posterior cervical nodes on exam 01/03/2023    Disposition: Kaitlyn Hoover is in clinical remission from colon cancer.  I reviewed the restaging CT findings and images with her.  There is no evidence of recurrent colon cancer.  A cyst in the pancreas body appears larger.  I will present her case at the GI tumor conference to discuss the indication for further evaluation.  She has chronic mild elevation of the CEA.  This is likely a benign finding.  The CEA has not changed significantly for the past several years.  Kaitlyn Hoover will return for an office visit and CEA in 6 months.  The liver enzymes were markedly elevated on 03/16/2024.  We will be sure the liver enzymes were repeated this week.  Coni Deep, MD  03/25/2024  11:12 AM

## 2024-03-26 ENCOUNTER — Telehealth: Payer: Self-pay | Admitting: *Deleted

## 2024-03-26 NOTE — Telephone Encounter (Signed)
-----   Message from Coni Deep sent at 03/26/2024  7:04 AM EDT ----- Please call patient, liver enzymes are improved compared to 4/29 Copy results of 4/28-5/8 chem panels to primary MD, f/u as scheduled

## 2024-03-26 NOTE — Telephone Encounter (Signed)
 Notified of improvement in liver functions. Copy routed to her PCP as well. She is asking about CEA results and these are slightly higher. Is asking if she needs to check again before November? Will inquire of MD and get back with her.

## 2024-03-26 NOTE — Telephone Encounter (Signed)
 Called patient back and informed her Dr. Scherrie Curt said OK to wait and check again in November, since her current CEA is lower than it was 2 years ago. She understands and agrees.

## 2024-03-30 ENCOUNTER — Encounter: Payer: Self-pay | Admitting: Psychiatry

## 2024-03-30 ENCOUNTER — Ambulatory Visit: Payer: PPO | Admitting: Psychiatry

## 2024-03-30 DIAGNOSIS — F5105 Insomnia due to other mental disorder: Secondary | ICD-10-CM | POA: Diagnosis not present

## 2024-03-30 DIAGNOSIS — G3184 Mild cognitive impairment, so stated: Secondary | ICD-10-CM

## 2024-03-30 DIAGNOSIS — F250 Schizoaffective disorder, bipolar type: Secondary | ICD-10-CM | POA: Diagnosis not present

## 2024-03-30 DIAGNOSIS — F99 Mental disorder, not otherwise specified: Secondary | ICD-10-CM | POA: Diagnosis not present

## 2024-03-30 MED ORDER — LORAZEPAM 0.5 MG PO TABS: 0.5000 mg | ORAL_TABLET | Freq: Every day | ORAL | 5 refills | Status: DC

## 2024-03-30 MED ORDER — FANAPT 4 MG PO TABS
4.0000 mg | ORAL_TABLET | Freq: Every evening | ORAL | 3 refills | Status: DC
Start: 2024-03-30 — End: 2024-10-07

## 2024-03-30 MED ORDER — ZIPRASIDONE HCL 40 MG PO CAPS: 40.0000 mg | ORAL_CAPSULE | Freq: Every day | ORAL | 1 refills | Status: DC

## 2024-03-30 MED ORDER — LAMOTRIGINE 100 MG PO TABS: 100.0000 mg | ORAL_TABLET | Freq: Every day | ORAL | 1 refills | Status: DC

## 2024-03-30 NOTE — Progress Notes (Signed)
 Kaitlyn Hoover [id number] [birth year] 83 y.o.     Subjective:   Patient ID:  Kaitlyn Hoover is a 83 y.o. (DOB 21-Aug-1941) female.  Chief Complaint:  Chief Complaint  Patient presents with   Follow-up    Mood, paranoia, sleep    Kaitlyn Hoover presents for follow-up of psych issures  seen November 2020  and no meds were changed.  She called in August 2020  wanting to increase donepezil  from 5 to 10 mg daily.  That was done.  03/28/20 appt with the following noted: Deep depression March.  No pcpt other than being confined.  No seasonal changes.  No real problems with depression in years.  Did worry about it.  Even had SI without plan.  Then got better.  Totally better now spontaneously.  She wonders about reducing ziprasidone . Doing OK overall.  Bored.  Not depressed.  Sleeping well with temazepam  30 mg with 8-9 hours.  Doing Zoom with church and looks forward to it.  Otherwise watching TV. Tolerated Aricept  and has seen benefit for recall.  No paranoia or fear.   No med changes  06/26/20 TC stating: She's referring to the Temazepam  30 mg, she stopped on Thursday and hasn't slept in  4 days. She asked if eventually she would start sleeping without? I discussed that you would might recommend she go down to 15 mg, she really doesn't want to take it at all but will do as instructed. She does really she has to get some sleep. She's tried Benadryl  but not helped either.  Following info was given to her: She has tapered off temazepam  before but had to restart it.  She is tried alternatives clonazepam, Tranxene, trazodone, hydroxyzine.  She prefers to avoid meds that could have weight gain.  She could probably stop temazepam  more quickly and easily and still sleep if she took half of the 25 mg quetiapine but it may still give her a hangover. The way we tapered her off temazepam  the last time was in the traditional manner of reducing the dose.  She still will have some insomnia but it likely  is to be more bearable.  I would suggest we send in a prescription for 15 mg capsules and she take that for 2 to 4 weeks and she can try stopping again if she wants to at that point. If she stops temazepam  cold [place] as she is doing currently she is likely to have pretty serious insomnia and very fragmented sleep that will not respond to Benadryl  for a period of 2 to 4 weeks.  She has chronic insomnia which is the reason she is taken the temazepam  and I am not sure what her sleep will be like once she gets through the withdrawal phase of stopping the temazepam . Pt via TC stated: Rtc to [facility] and she would prefer the 15 mg capsule of Temazepam , take 1 at hs. Rx called into her [facility] [address] Discussed her chronic insomnia and she verbalized understanding. Advised to call back with worsening condition or not effective for sleep.   07/05/20 appt with the following noted: Hell trying to stop temazepam  30 mg cold [place].  Couldn't sleep for days. Sleep is fine on 15 mg HS however. No interest, not going to church since Pandemic.  Not worrying much about Covid.  Got vaccine.  Can't motivate herself to go get clothes or yard work.  Just sits and watches TV or reads.  Only social with friends once  monthly.  Don't understand while not going to church.  Unmotivated and some sadness. No sig worry or fear other than about her mental health and no paranoia. Doctor said iron was low.  Recent UTI and ABx helped energy.  Donepezil  has really helped with her memory she is well pleased.  Patient denies any suicidal ideation. Plan: Added lamotrigine  for depression  09/07/20 appt with following noted: More motivated and active and productive and less napping.  Big difference with addtion of lamotrigine .  Got benefit by 50 mg daily. NO SE. Depression resolved "definitely" No paranoia.  Nor fearfulness nor mood swings. Geodon  and Fanapt  doses are perfect.  Mild mouth movements but not severe. Sleep well with  temazepam . Plan: no med changes  03/08/21 appt noted: Just not having any trouble.  Got me on the right medication.  Occ a little down but it's ok. No SE with lamotrigine . Had a fall and hurt back.  Not much better and had her down some.  Don't get out much. 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  Not going to church bc it's hard. Sleep OK No paranoia.  09/06/21 appt noted: Colon CA dx and surg 3 weeks ago. 3 positive nodes and started chemo soon. Been awful.  Depressed  since surgery.  Not really bad.  Feels drugged in AM until about 1 PM No change in meds.  Sleep 10 -8.    No insomnia.   Worried over chemo and how it will affect her.  Not worried over cancer. No paranoia since here. Plan: Yes reduce temazepam  to 7.5 mg HS Continue ziprasidone  40, Fanapt  4 mg 1/4 tablet daily, lamotrigine  100  12/18/21 appt noted: Doing OK. Sleeping fine with temazepam  7.5 mg HS No mood problems or swings.  No fear or anxiety.  No paranoia. Satisfied with meds.  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 Otherwise satisfied with activity.  06/18/22 appt noted: Psych meds: donepezil  10, lamotrigine  100, Fanapt  1 mg and Geodon  40 mg daily without changes. Fine without complaints.  Is tired a lot.  No napping but does lay down. Taking one capsule nightly of temazepam  and she thinks it's 15 mg but computer shows 7.5 mg HS . Sleep 9 hours. No paranoia.  Not sig depression but does lay down a couple of times daily.  Not sad.  Not a lot of interest.  Is bored.  Occ finds Netflix interests.  Will procrastinate things.   Health has been stable. Cancer free (hx colon CA) Finished chemo.   Pleased with meds and no new SE concerns.  No clenching any more with Fanapt  added to Geodon .  12/24/22 appt noted: Had to put done dog of 18 years.   Other wise had been ok.   Psych meds: donepezil  10, lamotrigine  100, Fanapt  1 mg and Geodon  40 mg daily, temazepam  7.5 mg HS without changes. Otherwise ok re:  mood.  No fear or paranoia at all. Sleep pretty good except awhile to get to sleep.  Then 8-9 hours.   No new health problems. Memory is surprisingly good.   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  09/23/23 appt noted: Psych meds: donepezil  10, lamotrigine  100, Fanapt  1 mg and Geodon  40 mg daily, temazepam  7.5 mg HS without changes. Temazepam  not getting her to sleep. Going to bed 11 , to sleep an hour or more .  Wakes about 930-10.   No mood px.  No paranoia.  Anxiety manageable. Wt gain 23# to 142# over the last year.  Wonders about wt loss drugs.  Only eat 2 meals daily.  Might snack but not daily.   Less active bc difficulty with legs and back.  Don't walk much.   Post polio. Chiropracter weekly for pain. Plan: Switch temazepam  to lorazepam  0.5 mg HS Reduce time in bed to 9 hour , her usual sleep time.  CBT  03/30/24 appt noted: Med: donepezil  10, lamotrigine  100, Fanapt  1 mg and Geodon  40 mg daily, temazepam  7.5 mg HS without changes. no sig SE concerns.  No hangover.  Ok  and no sig px.  Mood and anxiety has been ok. Hosp recently with NVD and doesn't feel really great.  Maybe food related.   Sleep is chronically a px.  Takes a couple hour to go to sleep.  11 to bed and up at night or 10.   No fear nor paranoia.   Kind of depressed since hospital stay end of April.  Feels more unsteady and it's kind of depressing.  Ordering a life alert.   Usually if feels well goes out with women's group for lunch monthly.  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  Past Psychiatric Medication Trials:  Olanzapine haloperidol, Abilify, Latuda, Saphris, Geodon  60 , Fanapt  2 mg,  Depakote 250 mg 3 times d aily, benztropine, Artane,  clonazepam side effects, Tranxene increased appetite, temazepam ,  lorazepam  0.5 HS trazodone, hydroxyzine She has been under the care of this office since 2003.  Review of Systems:  Review of Systems  Constitutional:   Positive for fatigue and unexpected weight change.  HENT:         Jaw clenching stable.  Musculoskeletal:  Positive for back pain and gait problem.  Neurological:  Positive for facial asymmetry and weakness. Negative for dizziness and tremors.  Psychiatric/Behavioral:  Negative for agitation, behavioral problems, confusion, decreased concentration, dysphoric mood, hallucinations, self-injury, sleep disturbance and suicidal ideas. The patient is not nervous/anxious and is not hyperactive.     Medications: I have reviewed the patient's current medications.  Current Outpatient Medications  Medication Sig Dispense Refill   acyclovir  (ZOVIRAX ) 400 MG tablet Take 400 mg at bedtime by mouth.      Ascorbic Acid  (VITAMIN C ) 1000 MG tablet Take 1,000 mg by mouth daily.     azelastine  (OPTIVAR ) 0.05 % ophthalmic solution Place 2 drops into both eyes daily as needed. Inching eyes     b complex vitamins capsule Take 1 capsule by mouth daily.     cholecalciferol (VITAMIN D ) 25 MCG (1000 UNIT) tablet Take 1,000 Units by mouth daily.     Coenzyme Q10 (CO Q-10) 100 MG CAPS Take 100 mg by mouth daily.      diltiazem  (CARDIZEM  CD) 180 MG 24 hr capsule Take 180 mg by mouth daily.     donepezil  (ARICEPT ) 10 MG tablet TAKE 1 TABLET BY MOUTH EVERYDAY AT BEDTIME 90 tablet 3   ELIQUIS  2.5 MG TABS tablet TAKE 1 TABLET BY MOUTH TWICE A DAY 60 tablet 5   famotidine  (PEPCID ) 20 MG tablet Take 20 mg by mouth daily.     ferrous sulfate 324 MG TBEC Take 324 mg by mouth daily with breakfast.     fluticasone  (FLONASE ) 50 MCG/ACT nasal spray Place 2 sprays into both nostrils daily.     Ginkgo Biloba 120 MG TABS Take 240 mg by mouth daily.     levothyroxine  (SYNTHROID , LEVOTHROID) 100 MCG tablet Take  100 mcg by mouth daily before breakfast.      MAGNESIUM  PO Take 250 mg by mouth every evening.     Melatonin 3 MG TABS Take 3 mg by mouth at bedtime.     Multiple Vitamin (MULTIVITAMIN) tablet Take 1 tablet by mouth daily.      Omega-3 Fatty Acids (FISH OIL  BURP-LESS) 1000 MG CAPS Take 3,000 mg by mouth 2 (two) times daily.     oxybutynin  (DITROPAN  XL) 15 MG 24 hr tablet Take 15 mg by mouth daily.      rosuvastatin  (CRESTOR ) 10 MG tablet TAKE 1 TABLET BY MOUTH UP TO 4 TIMES A WEEK ON SUNDAY, MONDAY,WEDNESDAY AND FRIDAY 48 tablet 2   sodium chloride  (OCEAN) 0.65 % SOLN nasal spray Place 2 sprays into both nostrils every evening.     Turmeric 500 MG CAPS Take 500 mg by mouth daily.     vitamin B-12 (CYANOCOBALAMIN) 1000 MCG tablet Take 1,000 mcg by mouth daily.     iloperidone  (FANAPT ) 4 MG TABS tablet Take 1 tablet (4 mg total) by mouth at bedtime. 30 tablet 3   lamoTRIgine  (LAMICTAL ) 100 MG tablet Take 1 tablet (100 mg total) by mouth daily. 90 tablet 1   LORazepam  (ATIVAN ) 0.5 MG tablet Take 1 tablet (0.5 mg total) by mouth at bedtime. 30 tablet 5   ziprasidone  (GEODON ) 40 MG capsule Take 1 capsule (40 mg total) by mouth daily at 12 noon. 90 capsule 1   No current facility-administered medications for this visit.    Medication Side Effects: Other: EPS prone.  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    Family History  Problem Relation Age of Onset   Hypertension Mother    Cancer Father        JAW BONE   COPD Brother    Atrial fibrillation Brother    Breast cancer Neg Hx     Social History   Socioeconomic History   Marital status: Divorced    Spouse name: Not on file   Number of  children: Not on file   Years of education: Not on file   Highest education level: Not on file  Occupational History   Not on file  Tobacco Use   Smoking status: Former    Current packs/day: 0.00    Average packs/day: 1 pack/day for 25.0 years (25.0 ttl pk-yrs)    Types: Cigarettes    Start date: 27    Quit date: 43    Years since quitting: 27.3   Smokeless tobacco: Never   Tobacco comments:    QUIT SMOKING IN 1998  Vaping Use   Vaping status: Never Used  Substance and Sexual Activity   Alcohol use: Not Currently   Drug use: No   Sexual activity: Not on file  Other Topics Concern   Not on file  Social History  Narrative   Not on file   Social Drivers of Health   Financial Resource Strain: Not on file  Food Insecurity: No Food Insecurity (03/15/2024)   Hunger Vital Sign    Worried About Running Out of Food in the Last Year: Never true    Ran Out of Food in the Last Year: Never true  Transportation Needs: No Transportation Needs (03/15/2024)   PRAPARE - Administrator, Civil Service (Medical): No    Lack of Transportation (Non-Medical): No  Physical Activity: Not on file  Stress: Not on file  Social Connections: Moderately Isolated (03/15/2024)   Social Connection and Isolation Panel [NHANES]    Frequency of Communication with Friends and Family: Three times a week    Frequency of Social Gatherings with Friends and Family: Once a week    Attends Religious Services: 1 to 4 times per year    Active Member of [employer] or Organizations: No    Attends Banker Meetings: Never    Marital Status: Divorced  Catering manager Violence: Not At Risk (03/15/2024)   Humiliation, Afraid, Rape, and Kick questionnaire    Fear of Current or Ex-Partner: No    Emotionally Abused: No    Physically Abused: No    Sexually Abused: No    Past Medical History, Surgical history, Social history, and Family history were reviewed and updated as appropriate.   Please see  review of systems for further details on the patient's review from today.   Objective:   Physical Exam:  LMP  (LMP Unknown)   Physical Exam Constitutional:      General: She is not in acute distress. Musculoskeletal:        General: No deformity.  Neurological:     Mental Status: She is alert and oriented to person, place, and time.     Cranial Nerves: Dysarthria present.     Coordination: Coordination abnormal.     Comments: Mild jaw clenching and jaw movements not severe. More gait difficulty  Psychiatric:        Attention and Perception: Attention and perception normal. She does not perceive auditory or visual hallucinations.        Mood and Affect: Mood is depressed. Mood is not anxious. Affect is not labile, blunt or angry.        Speech: Speech normal.        Behavior: Behavior normal. Behavior is not agitated. Behavior is cooperative.        Thought Content: Thought content normal. Thought content is not paranoid or delusional. Thought content does not include homicidal or suicidal ideation. Thought content does not include suicidal plan.        Cognition and Memory: Cognition and memory normal.        Judgment: Judgment normal.     Comments: History of paranoia in remission Mild dep over health. Mild hypoverbal more than before       Lab Review:     Component Value Date/Time   NA 146 (H) 03/25/2024 1030   NA 142 08/05/2023 1534   K 4.3 03/25/2024 1030   CL 108 03/25/2024 1030   CO2 24 03/25/2024 1030   GLUCOSE 94 03/25/2024 1030   BUN 11 03/25/2024 1030   BUN 21 08/05/2023 1534   CREATININE 1.28 (H) 03/25/2024 1030   CALCIUM  9.4 03/25/2024 1030   PROT 6.3 (L) 03/25/2024 1030   PROT 7.2 02/12/2017 1013   ALBUMIN  4.2 03/25/2024 1030   ALBUMIN  4.0 02/12/2017  1013   AST 32 03/25/2024 1030   ALT 64 (H) 03/25/2024 1030   ALKPHOS 123 03/25/2024 1030   BILITOT 0.6 03/25/2024 1030   GFRNONAA 42 (L) 03/25/2024 1030   GFRAA 53 (L) 11/02/2020 1146        Component Value Date/Time   WBC 7.8 03/16/2024 2335   RBC 4.16 03/16/2024 2335   HGB 12.9 03/16/2024 2339   HGB 14.4 08/05/2023 1534   HCT 38.0 03/16/2024 2339   HCT 43.3 08/05/2023 1534   PLT 179 03/16/2024 2335   PLT 188 08/05/2023 1534   MCV 98.6 03/16/2024 2335   MCV 97 08/05/2023 1534   MCH 32.0 03/16/2024 2335   MCHC 32.4 03/16/2024 2335   RDW 12.9 03/16/2024 2335   RDW 11.8 08/05/2023 1534   LYMPHSABS 1.0 03/15/2024 1215   LYMPHSABS 1.8 04/11/2017 0911   MONOABS 0.7 03/15/2024 1215   EOSABS 0.0 03/15/2024 1215   EOSABS 0.2 04/11/2017 0911   BASOSABS 0.0 03/15/2024 1215   BASOSABS 0.1 04/11/2017 0911    No results found for: "POCLITH", "LITHIUM"   No results found for: "PHENYTOIN", "PHENOBARB", "VALPROATE", "CBMZ"   .res Assessment: Plan:    Schizoaffective disorder, bipolar type (HCC) - Plan: iloperidone  (FANAPT ) 4 MG TABS tablet, lamoTRIgine  (LAMICTAL ) 100 MG tablet, ziprasidone  (GEODON ) 40 MG capsule  Mild cognitive impairment  Insomnia due to other mental disorder - Plan: LORazepam  (ATIVAN ) 0.5 MG tablet   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 It does not appear severe enough to warrant additional medication.  30 min face to face time with patient.   K has a long history of repeated episodes of paranoia associated with hypomania at times and at times independent of mood changes.  WE discussed her med-sensitivity.  As noted above she has tried multiple different antipsychotics and different antipsychotic combinations.   Discussed potential metabolic side effects associated with atypical antipsychotics, as well as potential risk for movement side effects. Advised pt to contact office if movement side effects occur.  She has mild dysarthria which may be related but it is no worse and it stable.  She is very specifically aware of the risk of tardive dyskinesia.   The unusual combination of antipsychotics is helpful  in that the Fanapt  actually offsets some of the dystonia that the Geodon  tends to cause.  Lower dose of antipsychotics has led to worsening paranoia.  Her paranoia is well controlled at the current combination of medications. 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   stable  Depression better with lamotrigine    Supportive therapy over dealing with cancer and mental health piece of it. She's doing well now.  Encourage her to get out more.  We discussed the short-term risks associated with benzodiazepines including sedation and increased fall risk among others.  Discussed long-term side effect risk including dependence, potential withdrawal symptoms, and the potential eventual dose-related risk of dementia.  But recent studies from 2020 dispute this association between benzodiazepines and dementia risk. Newer studies in 2020 do not support an association with dementia.  We have tried to taper her off benzodiazepines for sleep and she was able to be off for a short period of time but then had recurrence of insomnia.  She is aware of the risks but the benefit for sleep is noteworthy and she is failed multiple other sleep meds.   Temazepam  quit helping to get to sleep.  Continue ziprasidone  40, Fanapt  4 mg 1/4 tablet daily,  lamotrigine  100 Aricept  10 helped memory.  No SE. Still notices benefit. lorazepam  0.5 mg HS for chronic insomnia. Unlikely to comply Reduce time in bed to 9 hour , her usual sleep time.  CBT  She has had worsening paranoia at lower doses of ziprasidone  in the past.  No problems at 40 mg daily  Follow-up 6 months  Nori Beat MD, DFAPA   Please see After Visit Summary for patient specific instructions.  Future Appointments  Date Time Provider Department Center  06/01/2024  1:30 PM Nathanel Bal, PA-C [facility]  09/30/2024  2:45 PM [facility] PHLEBOTOMIST [facility] None  09/30/2024  3:00 PM Sumner Ends, MD [facility] None    No orders of the defined types were placed  in this encounter.      -------------------------------

## 2024-03-31 ENCOUNTER — Other Ambulatory Visit: Payer: Self-pay

## 2024-04-02 ENCOUNTER — Telehealth: Payer: Self-pay | Admitting: *Deleted

## 2024-04-02 DIAGNOSIS — C184 Malignant neoplasm of transverse colon: Secondary | ICD-10-CM

## 2024-04-02 DIAGNOSIS — K869 Disease of pancreas, unspecified: Secondary | ICD-10-CM

## 2024-04-02 NOTE — Telephone Encounter (Signed)
 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 A MRI w/without contrast of the abdomen is suggested to be more certain. She agrees to the scan and reports she is a very difficult stick. Informed her this will be noted on the order.

## 2024-04-02 NOTE — Progress Notes (Signed)
 The proposed treatment discussed in conference is for discussion purpose only and is not a binding recommendation.  The patients have not been physically examined, or presented with their treatment options.  Therefore, final treatment plans cannot be decided.

## 2024-04-03 NOTE — Progress Notes (Signed)
 Kaitlyn Hoover. [name], MD [facility] [email or web address]

## 2024-04-13 ENCOUNTER — Ambulatory Visit (HOSPITAL_COMMUNITY)
Admission: RE | Admit: 2024-04-13 | Discharge: 2024-04-13 | Disposition: A | Discharge status: Home / Self Care | Source: Ambulatory Visit | Attending: Oncology | Admitting: Oncology

## 2024-04-13 ENCOUNTER — Other Ambulatory Visit: Payer: Self-pay | Admitting: Oncology

## 2024-04-13 DIAGNOSIS — K869 Disease of pancreas, unspecified: Secondary | ICD-10-CM

## 2024-04-13 DIAGNOSIS — C184 Malignant neoplasm of transverse colon: Secondary | ICD-10-CM | POA: Diagnosis present

## 2024-04-13 MED ORDER — GADOBUTROL 1 MMOL/ML IV SOLN
6.0000 mL | Freq: Once | INTRAVENOUS | Status: AC | PRN
  Administered 2024-04-13: 6 mL via INTRAVENOUS

## 2024-04-16 ENCOUNTER — Ambulatory Visit: Payer: Self-pay | Admitting: Oncology

## 2024-04-16 NOTE — Telephone Encounter (Signed)
 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

## 2024-04-22 ENCOUNTER — Other Ambulatory Visit: Payer: Self-pay | Admitting: Nurse Practitioner

## 2024-04-22 DIAGNOSIS — R1310 Dysphagia, unspecified: Secondary | ICD-10-CM

## 2024-04-27 ENCOUNTER — Other Ambulatory Visit

## 2024-05-06 ENCOUNTER — Ambulatory Visit
Admission: RE | Admit: 2024-05-06 | Discharge: 2024-05-06 | Disposition: A | Discharge status: Home / Self Care | Source: Ambulatory Visit | Attending: Nurse Practitioner | Admitting: Nurse Practitioner

## 2024-05-06 DIAGNOSIS — R1310 Dysphagia, unspecified: Secondary | ICD-10-CM

## 2024-05-29 ENCOUNTER — Other Ambulatory Visit: Payer: Self-pay | Admitting: Cardiology

## 2024-05-29 DIAGNOSIS — I480 Paroxysmal atrial fibrillation: Secondary | ICD-10-CM

## 2024-05-31 NOTE — Telephone Encounter (Signed)
 Prescription refill request for Eliquis  received. Indication:afib Last office visit:1/25 Scr:1.28  5/25 Age: 83 Weight:64.3  kg  Prescription refilled

## 2024-06-01 ENCOUNTER — Ambulatory Visit (HOSPITAL_COMMUNITY)
Admission: RE | Admit: 2024-06-01 | Discharge: 2024-06-01 | Disposition: A | Discharge status: Home / Self Care | Payer: PPO | Source: Ambulatory Visit | Attending: Internal Medicine | Admitting: Internal Medicine

## 2024-06-01 VITALS — BP 202/80 | HR 69 | Ht 62.0 in | Wt 137.4 lb

## 2024-06-01 DIAGNOSIS — I4891 Unspecified atrial fibrillation: Secondary | ICD-10-CM | POA: Diagnosis not present

## 2024-06-01 DIAGNOSIS — I10 Essential (primary) hypertension: Secondary | ICD-10-CM | POA: Diagnosis not present

## 2024-06-01 DIAGNOSIS — D6869 Other thrombophilia: Secondary | ICD-10-CM | POA: Diagnosis not present

## 2024-06-01 DIAGNOSIS — I480 Paroxysmal atrial fibrillation: Secondary | ICD-10-CM | POA: Diagnosis not present

## 2024-06-01 NOTE — Progress Notes (Signed)
 Primary Care Physician: Orion Kerns, MD Primary Cardiologist: None Electrophysiologist: Will Gladis Norton, MD     Referring Physician: Dr. Norton Moccasin Kaitlyn Hoover is a 83 y.o. 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 Seen by Dr. Norton on 9/17 and noted to be in atrial flutter. She is s/p successful DCCV on 08/06/23. Patient is on Eliquis  2.5 mg BID for a CHADS2VASC score of 5.  On follow up 06/01/24, she is currently in NSR. She has had no episodes of Afib since last office visit with Dr. Norton. No bleeding issues on Eliquis . Patient states she is taking care of her brother who is in bad shape. She notes she was so busy this morning she gave his medications and forgot to take her own.   Today, she denies symptoms of palpitations, chest pain, shortness of breath, orthopnea, PND, lower extremity edema, dizziness, presyncope, syncope, snoring, daytime somnolence, bleeding, or neurologic sequela. The patient is tolerating medications without difficulties and is otherwise without complaint today.    she has a BMI of Body mass index is 25.13 kg/m.[name] Filed Weights   06/01/24 1328  Weight: 62.3 kg     Current Outpatient Medications  Medication Sig Dispense Refill   acyclovir  (ZOVIRAX ) 400 MG tablet Take 400 mg at bedtime by mouth.      Ascorbic Acid  (VITAMIN C ) 1000 MG tablet Take 1,000 mg by mouth daily.     azelastine  (OPTIVAR ) 0.05 % ophthalmic solution Place 2 drops into both eyes daily as needed. Inching eyes     b complex vitamins capsule Take 1 capsule by mouth daily.     cholecalciferol (VITAMIN D ) 25 MCG (1000 UNIT) tablet Take 1,000 Units by mouth daily.     Coenzyme Q10 (CO Q-10) 100 MG CAPS Take 100 mg by mouth daily.      diltiazem  (CARDIZEM  CD) 180 MG 24 hr capsule  Take 180 mg by mouth daily.     donepezil  (ARICEPT ) 10 MG tablet TAKE 1 TABLET BY MOUTH EVERYDAY AT BEDTIME 90 tablet 3   ELIQUIS  2.5 MG TABS tablet TAKE 1 TABLET BY MOUTH TWICE A DAY 60 tablet 5   famotidine  (PEPCID ) 20 MG tablet Take 20 mg by mouth daily.     ferrous sulfate 324 MG TBEC Take 324 mg by mouth daily with breakfast.     fluticasone  (FLONASE ) 50 MCG/ACT nasal spray Place 2 sprays into both nostrils daily.     Ginkgo Biloba 120 MG TABS Take 240 mg by mouth daily.     iloperidone  (FANAPT ) 4 MG TABS tablet Take 1 tablet (4 mg total) by mouth at bedtime. 30 tablet 3   lamoTRIgine  (LAMICTAL ) 100 MG tablet Take 1 tablet (100 mg total) by mouth daily. 90 tablet 1   levothyroxine  (SYNTHROID , LEVOTHROID) 100 MCG tablet Take 100 mcg by mouth daily before breakfast.      LORazepam  (ATIVAN ) 0.5 MG tablet Take 1 tablet (0.5 mg total) by mouth at bedtime. 30 tablet 5   MAGNESIUM  PO Take 250 mg by mouth every evening.     Melatonin 3 MG TABS Take 3 mg by mouth at bedtime.     Multiple Vitamin (MULTIVITAMIN) tablet Take 1 tablet by mouth daily.     nitrofurantoin (MACRODANTIN) 100 MG capsule Take 100 mg by mouth 2 (two) times  daily.     Omega-3 Fatty Acids (FISH OIL  BURP-LESS) 1000 MG CAPS Take 3,000 mg by mouth 2 (two) times daily.     oxybutynin  (DITROPAN  XL) 15 MG 24 hr tablet Take 15 mg by mouth daily.      rosuvastatin  (CRESTOR ) 10 MG tablet TAKE 1 TABLET BY MOUTH UP TO 4 TIMES A WEEK ON SUNDAY, MONDAY,WEDNESDAY AND FRIDAY 48 tablet 2   sodium chloride  (OCEAN) 0.65 % SOLN nasal spray Place 2 sprays into both nostrils every evening.     Turmeric 500 MG CAPS Take 500 mg by mouth daily.     vitamin B-12 (CYANOCOBALAMIN) 1000 MCG tablet Take 1,000 mcg by mouth daily.     ziprasidone  (GEODON ) 40 MG capsule Take 1 capsule (40 mg total) by mouth daily at 12 noon. 90 capsule 1   No current facility-administered medications for this encounter.    Atrial Fibrillation Management  history:  Previous antiarrhythmic drugs:  Previous cardioversions: 2018, 08/06/23 Previous ablations: Afib 2018, SVT 2019 Anticoagulation history: Eliquis    ROS- All systems are reviewed and negative except as per the HPI above.  Physical Exam: BP (!) 202/80   Pulse 69   Ht 5' 2 (1.575 m)   Wt 62.3 kg   LMP  (LMP Unknown)   BMI 25.13 kg/m   GEN- The patient is well appearing, alert and oriented x 3 today.   Neck - no JVD or carotid bruit noted Lungs- Clear to ausculation bilaterally, normal work of breathing Heart- Regular rate and rhythm, no murmurs, rubs or gallops, PMI not laterally displaced Extremities- no clubbing, cyanosis, or edema Skin - no rash or ecchymosis noted   EKG today demonstrates  Vent. rate 69 BPM PR interval 162 ms QRS duration 72 ms QT/QTcB 394/422 ms P-R-T axes 82 42 94 Normal sinus rhythm Nonspecific ST and T wave abnormality Abnormal ECG When compared with ECG of 16-Mar-2024 23:32, PREVIOUS ECG IS PRESENT  Echo 03/2017 demonstrated  Study Conclusions   - Left ventricle: The cavity size was normal. Wall thickness was    normal. Systolic function was normal. The estimated ejection    fraction was in the range of 60% to 65%. Wall motion was normal;    there were no regional wall motion abnormalities. Features are    consistent with a pseudonormal left ventricular filling pattern,    with concomitant abnormal relaxation and increased filling    pressure (grade 2 diastolic dysfunction).  - Aortic valve: There was no stenosis.  - Mitral valve: Mildly calcified annulus. There was trivial    regurgitation.  - Left atrium: The atrium was moderately dilated.  - Right ventricle: The cavity size was normal. Systolic function    was normal.  - Tricuspid valve: Peak RV-RA gradient (S): 28 mm Hg.  - Pulmonary arteries: PA peak pressure: 31 mm Hg (S).  - Inferior vena cava: The vessel was normal in size. The    respirophasic diameter changes were in  the normal range (>= 50%),    consistent with normal central venous pressure.  - Pericardium, extracardiac: A trivial pericardial effusion was    identified.   ASSESSMENT & PLAN CHA2DS2-VASc Score = 5  The patient's score is based upon: CHF History: 0 HTN History: 1 Diabetes History: 0 Stroke History: 0 Vascular Disease History: 1 Age Score: 2 Gender Score: 1       ASSESSMENT AND PLAN: Paroxysmal Atrial Fibrillation (ICD10:  I48.0) / Atrial flutter The patient's CHA2DS2-VASc score is 5,  indicating a 7.2% annual risk of stroke.   S/p successful DCCV on 08/06/23.   She is currently in NSR. Continue diltiazem  180 mg daily. Take medication for BP as soon as she gets home. I discussed with her ED precautions because her blood pressure is dangerously elevated. Patient denies HA or blurry vision. She declines ED visit today. She will take her BP medicine as soon as she gets home. Monitor BP at home.   Going forward, if she has recurrence would be potential candidate for repeat ablation or Tikosyn 125 mcg (CrCl 37 mL/min from [facility] 08/05/23; Qtc 444 ms) or amiodarone.    Secondary Hypercoagulable State (ICD10:  D68.69) The patient is at significant risk for stroke/thromboembolism based upon her CHA2DS2-VASc Score of 5.  Continue Apixaban  (Eliquis ).  Advised patient she go ahead and count her morning dose as officially missed and take her next dose at the normal time.   Hypertension As above.    Follow up 6 months Afib clinic. [name] Terra Pac, PA-C  Afib Clinic [facility] [address] [place], [place] [address] [phone]

## 2024-07-08 ENCOUNTER — Other Ambulatory Visit: Payer: Self-pay | Admitting: Cardiology

## 2024-09-30 ENCOUNTER — Inpatient Hospital Stay: Attending: Oncology

## 2024-09-30 ENCOUNTER — Inpatient Hospital Stay: Admitting: Oncology

## 2024-09-30 ENCOUNTER — Ambulatory Visit: Payer: Self-pay | Admitting: Oncology

## 2024-09-30 VITALS — BP 148/73 | HR 98 | Temp 98.0°F | Resp 18 | Ht 62.0 in | Wt 133.3 lb

## 2024-09-30 DIAGNOSIS — Z9221 Personal history of antineoplastic chemotherapy: Secondary | ICD-10-CM | POA: Insufficient documentation

## 2024-09-30 DIAGNOSIS — Z9049 Acquired absence of other specified parts of digestive tract: Secondary | ICD-10-CM | POA: Diagnosis not present

## 2024-09-30 DIAGNOSIS — Z860100 Personal history of colon polyps, unspecified: Secondary | ICD-10-CM | POA: Insufficient documentation

## 2024-09-30 DIAGNOSIS — C184 Malignant neoplasm of transverse colon: Secondary | ICD-10-CM

## 2024-09-30 DIAGNOSIS — R970 Elevated carcinoembryonic antigen [CEA]: Secondary | ICD-10-CM | POA: Insufficient documentation

## 2024-09-30 DIAGNOSIS — Z85038 Personal history of other malignant neoplasm of large intestine: Secondary | ICD-10-CM | POA: Diagnosis present

## 2024-09-30 DIAGNOSIS — I4891 Unspecified atrial fibrillation: Secondary | ICD-10-CM | POA: Insufficient documentation

## 2024-09-30 DIAGNOSIS — F319 Bipolar disorder, unspecified: Secondary | ICD-10-CM | POA: Diagnosis not present

## 2024-09-30 DIAGNOSIS — Z7901 Long term (current) use of anticoagulants: Secondary | ICD-10-CM | POA: Insufficient documentation

## 2024-09-30 LAB — CEA (ACCESS): CEA (CHCC): 7.29 ng/mL — ABNORMAL HIGH (ref 0.00–5.00)

## 2024-09-30 NOTE — Progress Notes (Signed)
 [facility] OFFICE PROGRESS NOTE   Diagnosis: Colon cancer  INTERVAL HISTORY:   [name] returns as scheduled.  She feels well.  Good appetite.  She reports chronic constipation.  No bleeding.  No new complaint.  Objective:  Vital signs in last 24 hours:  Blood pressure (!) 148/73, pulse 98, temperature 98 F (36.7 C), temperature source Temporal, resp. rate 18, height 5' 2 (1.575 m), weight 133 lb 4.8 oz (60.5 kg), SpO2 100%.    Lymphatics: No cervical, supraclavicular, axillary, or inguinal nodes Resp: End inspiratory coarse rhonchi/wheeze at the posterior chest bilaterally, no respiratory distress Cardio: Regular rate and rhythm, there are periods of frequent premature beats GI: No hepatosplenomegaly, no mass, nontender Vascular: No leg edema   Lab Results:  Lab Results  Component Value Date   WBC 7.8 03/16/2024   HGB 12.9 03/16/2024   HCT 38.0 03/16/2024   MCV 98.6 03/16/2024   PLT 179 03/16/2024   NEUTROABS 7.1 03/15/2024    CMP  Lab Results  Component Value Date   NA 146 (H) 03/25/2024   K 4.3 03/25/2024   CL 108 03/25/2024   CO2 24 03/25/2024   GLUCOSE 94 03/25/2024   BUN 11 03/25/2024   CREATININE 1.28 (H) 03/25/2024   CALCIUM  9.4 03/25/2024   PROT 6.3 (L) 03/25/2024   ALBUMIN  4.2 03/25/2024   AST 32 03/25/2024   ALT 64 (H) 03/25/2024   ALKPHOS 123 03/25/2024   BILITOT 0.6 03/25/2024   GFRNONAA 42 (L) 03/25/2024   GFRAA 53 (L) 11/02/2020    Lab Results  Component Value Date   CEA1 5.4 (H) 08/06/2021   CEA 8.63 (H) 03/25/2024    Lab Results  Component Value Date   INR 1.2 03/16/2024   LABPROT 15.4 (H) 03/16/2024    Imaging:  No results found.  Medications: I have reviewed the patient's current medications.   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 Tumor invades through the serosa, 3/25 lymph nodes, MSS, no loss of mismatch repair protein  expression CTs 06/26/2021-eccentric wall thickening of the hepatic flexure, tiny pulmonary nodules measuring up to 2 mm-likely infectious or inflammatory Elevated preoperative CEA Cycle 1 Xeloda  09/10/2021 Cycle 2 Xeloda  10/01/2021 Cycle 3 Xeloda  10/22/2021 Cycle 4 Xeloda  11/12/2021-last dose 11/16/2021, hospitalized in [place]  11/18/2021 with small bowel obstruction Cycle 5 Xeloda  12/17/2021 Cycle 6 Xeloda  01/07/2022 Cycle 7 Xeloda  01/28/2022 Cycle 8 Xeloda  02/18/2022 Mild elevation of the CEA CTs 03/19/2022-no evidence of metastatic disease, unchanged tiny pulmonary nodules-likely benign Colonoscopy 09/03/2022-negative CTs 04/10/2023-no evidence of metastatic disease; again noted small scattered subpleural nodules lungs bilaterally, upper lobe predominant, measuring 2 to 3 mm, not considered suspicious for metastatic disease.  No new/suspicious pulmonary nodules. 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  Diagnosed with small bowel obstruction.  Discharged home 11/25/2021. Small bilateral posterior cervical nodes on exam 01/03/2023      Disposition: [name] is in clinical remission from colon cancer.  There is chronic mild elevation of the CEA.  We will  follow-up on the CEA from today.  Cystic pancreas lesions were noted on the MRI abdomen 04/13/2024.  These lesions were felt to be benign.  She will undergo surveillance CT imaging  prior to an office visit in 6 months.  She declines IV contrast.   Arley Hof, MD  09/30/2024  3:00 PM

## 2024-10-04 NOTE — Telephone Encounter (Signed)
 Patient gave verbal understanding and had no further questions.

## 2024-10-04 NOTE — Telephone Encounter (Signed)
-----   Message from Arley Hof sent at 09/30/2024  5:33 PM EST ----- Please call patient, the CEA is stable, follow-up scheduled  ----- Message ----- From: Rebecka, [facility] [place] Sent: 09/30/2024   3:44 PM EST To: Arley KATHEE Hof, MD

## 2024-10-07 ENCOUNTER — Encounter: Payer: Self-pay | Admitting: Psychiatry

## 2024-10-07 ENCOUNTER — Ambulatory Visit: Admitting: Psychiatry

## 2024-10-07 DIAGNOSIS — F250 Schizoaffective disorder, bipolar type: Secondary | ICD-10-CM | POA: Diagnosis not present

## 2024-10-07 DIAGNOSIS — G3184 Mild cognitive impairment, so stated: Secondary | ICD-10-CM

## 2024-10-07 DIAGNOSIS — F99 Mental disorder, not otherwise specified: Secondary | ICD-10-CM | POA: Diagnosis not present

## 2024-10-07 DIAGNOSIS — F5105 Insomnia due to other mental disorder: Secondary | ICD-10-CM

## 2024-10-07 MED ORDER — ZIPRASIDONE HCL 40 MG PO CAPS: 40.0000 mg | ORAL_CAPSULE | Freq: Every day | ORAL | 1 refills | Status: AC

## 2024-10-07 MED ORDER — LORAZEPAM 0.5 MG PO TABS: 0.5000 mg | ORAL_TABLET | Freq: Every day | ORAL | 5 refills | Status: AC

## 2024-10-07 MED ORDER — FANAPT 4 MG PO TABS: 4.0000 mg | ORAL_TABLET | Freq: Every evening | ORAL | 1 refills | Status: AC

## 2024-10-07 MED ORDER — LAMOTRIGINE 100 MG PO TABS: 100.0000 mg | ORAL_TABLET | Freq: Every day | ORAL | 1 refills | Status: AC

## 2024-10-07 MED ORDER — DONEPEZIL HCL 10 MG PO TABS: 10.0000 mg | ORAL_TABLET | Freq: Every day | ORAL | 3 refills | Status: AC

## 2024-10-07 NOTE — Progress Notes (Signed)
 Kaitlyn Hoover [id number] 1940/12/24 83 y.o.     Subjective:   Patient ID:  Kaitlyn Hoover is a 83 y.o. (DOB September 19, 1941) female.  Chief Complaint:  Chief Complaint  Patient presents with   Follow-up    Kaitlyn Hoover presents for follow-up of psych issures  seen November 2020  and no meds were changed.  She called in August 2020  wanting to increase donepezil  from 5 to 10 mg daily.  That was done.  03/28/20 appt with the following noted: Deep depression March.  No pcpt other than being confined.  No seasonal changes.  No real problems with depression in years.  Did worry about it.  Even had SI without plan.  Then got better.  Totally better now spontaneously.  She wonders about reducing ziprasidone . Doing OK overall.  Bored.  Not depressed.  Sleeping well with temazepam  30 mg with 8-9 hours.  Doing Zoom with church and looks forward to it.  Otherwise watching TV. Tolerated Aricept  and has seen benefit for recall.  No paranoia or fear.   No med changes  06/26/20 TC stating: She's referring to the Temazepam  30 mg, she stopped on Thursday and hasn't slept in  4 days. She asked if eventually she would start sleeping without? I discussed that you would might recommend she go down to 15 mg, she really doesn't want to take it at all but will do as instructed. She does really she has to get some sleep. She's tried Benadryl  but not helped either.  Following info was given to her: She has tapered off temazepam  before but had to restart it.  She is tried alternatives clonazepam, Tranxene, trazodone, hydroxyzine.  She prefers to avoid meds that could have weight gain.  She could probably stop temazepam  more quickly and easily and still sleep if she took half of the 25 mg quetiapine but it may still give her a hangover. The way we tapered her off temazepam  the last time was in the traditional manner of reducing the dose.  She still will have some insomnia but it likely is to be more bearable.   I would suggest we send in a prescription for 15 mg capsules and she take that for 2 to 4 weeks and she can try stopping again if she wants to at that point. If she stops temazepam  cold turkey as she is doing currently she is likely to have pretty serious insomnia and very fragmented sleep that will not respond to Benadryl  for a period of 2 to 4 weeks.  She has chronic insomnia which is the reason she is taken the temazepam  and I am not sure what her sleep will be like once she gets through the withdrawal phase of stopping the temazepam . Pt via TC stated: Rtc to [facility] and she would prefer the 15 mg capsule of Temazepam , take 1 at hs. Rx called into her [facility] [address] Discussed her chronic insomnia and she verbalized understanding. Advised to call back with worsening condition or not effective for sleep.   07/05/20 appt with the following noted: Hell trying to stop temazepam  30 mg cold turkey.  Couldn't sleep for days. Sleep is fine on 15 mg HS however. No interest, not going to church since Pandemic.  Not worrying much about Covid.  Got vaccine.  Can't motivate herself to go get clothes or yard work.  Just sits and watches TV or reads.  Only social with friends once monthly.  Don't understand while not  going to church.  Unmotivated and some sadness. No sig worry or fear other than about her mental health and no paranoia. Doctor said iron was low.  Recent UTI and ABx helped energy.  Donepezil  has really helped with her memory she is well pleased.  Patient denies any suicidal ideation. Plan: Added lamotrigine  for depression  09/07/20 appt with following noted: More motivated and active and productive and less napping.  Big difference with addtion of lamotrigine .  Got benefit by 50 mg daily. NO SE. Depression resolved definitely No paranoia.  Nor fearfulness nor mood swings. Geodon  and Fanapt  doses are perfect.  Mild mouth movements but not severe. Sleep well with temazepam . Plan: no med  changes  03/08/21 appt noted: Just not having any trouble.  Got me on the right medication.  Occ a little down but it's ok. No SE with lamotrigine . Had a fall and hurt back.  Not much better and had her down some.  Don't get out much. 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  Not going to church bc it's hard. Sleep OK No paranoia.  09/06/21 appt noted: Colon CA dx and surg 3 weeks ago. 3 positive nodes and started chemo soon. Been awful.  Depressed  since surgery.  Not really bad.  Feels drugged in AM until about 1 PM No change in meds.  Sleep 10 -8.    No insomnia.   Worried over chemo and how it will affect her.  Not worried over cancer. No paranoia since here. Plan: Yes reduce temazepam  to 7.5 mg HS Continue ziprasidone  40, Fanapt  4 mg 1/4 tablet daily, lamotrigine  100  12/18/21 appt noted: Doing OK. Sleeping fine with temazepam  7.5 mg HS No mood problems or swings.  No fear or anxiety.  No paranoia. Satisfied with meds.  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 Otherwise satisfied with activity.  06/18/22 appt noted: Psych meds: donepezil  10, lamotrigine  100, Fanapt  1 mg and Geodon  40 mg daily without changes. Fine without complaints.  Is tired a lot.  No napping but does lay down. Taking one capsule nightly of temazepam  and she thinks it's 15 mg but computer shows 7.5 mg HS . Sleep 9 hours. No paranoia.  Not sig depression but does lay down a couple of times daily.  Not sad.  Not a lot of interest.  Is bored.  Occ finds Netflix interests.  Will procrastinate things.   Health has been stable. Cancer free (hx colon CA) Finished chemo.   Pleased with meds and no new SE concerns.  No clenching any more with Fanapt  added to Geodon .  12/24/22 appt noted: Had to put done dog of 18 years.   Other wise had been ok.   Psych meds: donepezil  10, lamotrigine  100, Fanapt  1 mg and Geodon  40 mg daily, temazepam  7.5 mg HS without changes. Otherwise ok re: mood.  No fear or paranoia  at all. Sleep pretty good except awhile to get to sleep.  Then 8-9 hours.   No new health problems. Memory is surprisingly good.   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  09/23/23 appt noted: Psych meds: donepezil  10, lamotrigine  100, Fanapt  1 mg and Geodon  40 mg daily, temazepam  7.5 mg HS without changes. Temazepam  not getting her to sleep. Going to bed 11 , to sleep an hour or more .  Wakes about 930-10.   No mood px.  No paranoia.  Anxiety manageable. Wt gain 23# to  142# over the last year.  Wonders about wt loss drugs.  Only eat 2 meals daily.  Might snack but not daily.   Less active bc difficulty with legs and back.  Don't walk much.   Post polio. Chiropracter weekly for pain. Plan: Switch temazepam  to lorazepam  0.5 mg HS Reduce time in bed to 9 hour , her usual sleep time.  CBT  03/30/24 appt noted: Med: donepezil  10, lamotrigine  100, Fanapt  1 mg and Geodon  40 mg daily, temazepam  7.5 mg HS without changes. no sig SE concerns.  No hangover.  Ok  and no sig px.  Mood and anxiety has been ok. Hosp recently with NVD and doesn't feel really great.  Maybe food related.   Sleep is chronically a px.  Takes a couple hour to go to sleep.  11 to bed and up at night or 10.   No fear nor paranoia.   Kind of depressed since hospital stay end of April.  Feels more unsteady and it's kind of depressing.  Ordering a life alert.   Usually if feels well goes out with women's group for lunch monthly. Plan: switch sleeper to lorazepam    10/07/24 appt noted:  Med: donepezil  10, lamotrigine  100, Fanapt  1 mg and Geodon  40 mg daily, lorazepam  0. 5 mg HS no sig SE concerns.  No hangover.  Doing pretty well but no problems except initial inso.  Once asleep don't wake.  Didn't notice much difference between lorazepam  and temazepam .   Not sig dep and no mood swings. No new health problems.  No sig anxiety nor paranoia.   Get sout with friends.  Every couple of  weeks and text with friends and goes out with B occ. She lives with B and caretakes him.    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  Past Psychiatric Medication Trials:  Olanzapine haloperidol, Abilify, Latuda, Saphris, Geodon  60 , Fanapt  2 mg,  Depakote 250 mg 3 times d aily, benztropine, Artane,  clonazepam side effects, Tranxene increased appetite, temazepam ,  lorazepam  0.5 HS trazodone, hydroxyzine She has been under the care of this office since 2003.  Review of Systems:  Review of Systems  Constitutional:  Positive for fatigue and unexpected weight change.  HENT:         Jaw clenching stable.  Musculoskeletal:  Positive for back pain and gait problem.  Neurological:  Positive for facial asymmetry and weakness. Negative for dizziness and tremors.  Psychiatric/Behavioral:  Negative for agitation, behavioral problems, confusion, decreased concentration, dysphoric mood, hallucinations, self-injury, sleep disturbance and suicidal ideas. The patient is not nervous/anxious and is not hyperactive.     Medications: I have reviewed the patient's current medications.  Current Outpatient Medications  Medication Sig Dispense Refill   acyclovir  (ZOVIRAX ) 400 MG tablet Take 400 mg at bedtime by mouth.      Ascorbic Acid  (VITAMIN C ) 1000 MG tablet Take 1,000 mg by mouth daily.     azelastine  (OPTIVAR ) 0.05 % ophthalmic solution Place 2 drops into both eyes daily as needed. Inching eyes     b complex vitamins capsule Take 1 capsule by mouth daily.     cholecalciferol (VITAMIN D ) 25 MCG (1000 UNIT) tablet Take 1,000 Units by mouth daily.     Coenzyme Q10 (CO Q-10) 100 MG CAPS Take 100 mg by mouth daily.      diltiazem  (CARDIZEM  CD) 180 MG 24 hr capsule Take 180 mg by mouth daily.     ELIQUIS  2.5 MG TABS  tablet TAKE 1 TABLET BY MOUTH TWICE A DAY 60 tablet 5   famotidine  (PEPCID ) 20 MG tablet Take 20 mg by mouth daily.     ferrous sulfate 324 MG TBEC Take 324 mg by mouth daily with  breakfast.     fluticasone  (FLONASE ) 50 MCG/ACT nasal spray Place 2 sprays into both nostrils daily.     Ginkgo Biloba 120 MG TABS Take 240 mg by mouth daily.     levothyroxine  (SYNTHROID , LEVOTHROID) 100 MCG tablet Take 100 mcg by mouth daily before breakfast.      MAGNESIUM  PO Take 250 mg by mouth every evening.     Melatonin 3 MG TABS Take 3 mg by mouth at bedtime.     Multiple Vitamin (MULTIVITAMIN) tablet Take 1 tablet by mouth daily.     Omega-3 Fatty Acids (FISH OIL  BURP-LESS) 1000 MG CAPS Take 3,000 mg by mouth 2 (two) times daily.     oxybutynin  (DITROPAN  XL) 15 MG 24 hr tablet Take 15 mg by mouth daily.      rosuvastatin  (CRESTOR ) 10 MG tablet TAKE 1 TABLET BY MOUTH UP TO 4 TIMES A WEEK ON SUNDAY, MONDAY,WEDNESDAY AND FRIDAY 48 tablet 2   sodium chloride  (OCEAN) 0.65 % SOLN nasal spray Place 2 sprays into both nostrils every evening.     Turmeric 500 MG CAPS Take 500 mg by mouth daily.     vitamin B-12 (CYANOCOBALAMIN) 1000 MCG tablet Take 1,000 mcg by mouth daily.     donepezil  (ARICEPT ) 10 MG tablet Take 1 tablet (10 mg total) by mouth at bedtime. 90 tablet 3   iloperidone  (FANAPT ) 4 MG TABS tablet Take 1 tablet (4 mg total) by mouth at bedtime. 90 tablet 1   lamoTRIgine  (LAMICTAL ) 100 MG tablet Take 1 tablet (100 mg total) by mouth daily. 90 tablet 1   LORazepam  (ATIVAN ) 0.5 MG tablet Take 1 tablet (0.5 mg total) by mouth at bedtime. 30 tablet 5   ziprasidone  (GEODON ) 40 MG capsule Take 1 capsule (40 mg total) by mouth daily at 12 noon. 90 capsule 1   No current facility-administered medications for this visit.    Medication Side Effects: Other: EPS prone.  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    Family History  Problem Relation Age of Onset   Hypertension Mother    Cancer Father        JAW BONE   COPD Brother    Atrial fibrillation Brother    Breast cancer Neg Hx     Social History   Socioeconomic History   Marital status: Divorced    Spouse name: Not on file   Number of children: Not on file   Years of education: Not on file   Highest education level: Not on file  Occupational History   Not on file  Tobacco Use   Smoking  status: Former    Current packs/day: 0.00    Average packs/day: 1 pack/day for 25.0 years (25.0 ttl pk-yrs)    Types: Cigarettes    Start date: 89    Quit date: 55    Years since quitting: 27.9   Smokeless tobacco: Never   Tobacco comments:    QUIT SMOKING IN 1998  Vaping Use   Vaping status: Never Used  Substance and Sexual Activity   Alcohol use: Not Currently   Drug use: No   Sexual activity: Not on file  Other Topics Concern   Not on file  Social History Narrative   Not on file   Social Drivers of Health   Financial Resource Strain: Not on file  Food Insecurity: No Food Insecurity (03/15/2024)   Hunger Vital Sign    Worried About Running Out of Food in the Last Year: Never true    Ran Out of Food in the Last Year: Never true  Transportation Needs: No Transportation Needs (03/15/2024)   PRAPARE - Administrator, Civil Service (Medical): No    Lack of Transportation (Non-Medical): No  Physical Activity: Not on file  Stress: Not on file  Social Connections: Moderately Isolated (03/15/2024)   Social Connection and Isolation Panel    Frequency of Communication with Friends and Family: Three times a week    Frequency of Social Gatherings with Friends and Family: Once a week     Attends Religious Services: 1 to 4 times per year    Active Member of [employer] or Organizations: No    Attends Banker Meetings: Never    Marital Status: Divorced  Catering Manager Violence: Not At Risk (03/15/2024)   Humiliation, Afraid, Rape, and Kick questionnaire    Fear of Current or Ex-Partner: No    Emotionally Abused: No    Physically Abused: No    Sexually Abused: No    Past Medical History, Surgical history, Social history, and Family history were reviewed and updated as appropriate.   Please see review of systems for further details on the patient's review from today.   Objective:   Physical Exam:  LMP  (LMP Unknown)   Physical Exam Constitutional:      General: She is not in acute distress. Musculoskeletal:        General: No deformity.  Neurological:     Mental Status: She is alert and oriented to person, place, and time.     Cranial Nerves: Dysarthria present.     Coordination: Coordination abnormal.     Comments: Mild-mod jaw clenching and jaw movements not severe. Stable gait difficulty  Psychiatric:        Attention and Perception: Attention and perception normal. She does not perceive auditory or visual hallucinations.        Mood and Affect: Mood is depressed. Mood is not anxious. Affect is not labile, blunt or angry.        Speech: Speech normal.        Behavior: Behavior normal. Behavior is not agitated. Behavior is cooperative.        Thought Content: Thought content normal. Thought content is not paranoid or delusional. Thought content does not include homicidal or suicidal ideation. Thought content does not include suicidal plan.        Cognition and Memory: Cognition and memory normal.        Judgment: Judgment normal.     Comments: History of paranoia in remission Mild dep over health. Mild hypoverbal  more than before       Lab Review:     Component Value Date/Time   NA 146 (H) 03/25/2024 1030   NA 142 08/05/2023 1534   K 4.3  03/25/2024 1030   CL 108 03/25/2024 1030   CO2 24 03/25/2024 1030   GLUCOSE 94 03/25/2024 1030   BUN 11 03/25/2024 1030   BUN 21 08/05/2023 1534   CREATININE 1.28 (H) 03/25/2024 1030   CALCIUM  9.4 03/25/2024 1030   PROT 6.3 (L) 03/25/2024 1030   PROT 7.2 02/12/2017 1013   ALBUMIN  4.2 03/25/2024 1030   ALBUMIN  4.0 02/12/2017 1013   AST 32 03/25/2024 1030   ALT 64 (H) 03/25/2024 1030   ALKPHOS 123 03/25/2024 1030   BILITOT 0.6 03/25/2024 1030   GFRNONAA 42 (L) 03/25/2024 1030   GFRAA 53 (L) 11/02/2020 1146       Component Value Date/Time   WBC 7.8 03/16/2024 2335   RBC 4.16 03/16/2024 2335   HGB 12.9 03/16/2024 2339   HGB 14.4 08/05/2023 1534   HCT 38.0 03/16/2024 2339   HCT 43.3 08/05/2023 1534   PLT 179 03/16/2024 2335   PLT 188 08/05/2023 1534   MCV 98.6 03/16/2024 2335   MCV 97 08/05/2023 1534   MCH 32.0 03/16/2024 2335   MCHC 32.4 03/16/2024 2335   RDW 12.9 03/16/2024 2335   RDW 11.8 08/05/2023 1534   LYMPHSABS 1.0 03/15/2024 1215   LYMPHSABS 1.8 04/11/2017 0911   MONOABS 0.7 03/15/2024 1215   EOSABS 0.0 03/15/2024 1215   EOSABS 0.2 04/11/2017 0911   BASOSABS 0.0 03/15/2024 1215   BASOSABS 0.1 04/11/2017 0911    No results found for: POCLITH, LITHIUM   No results found for: PHENYTOIN, PHENOBARB, VALPROATE, CBMZ   .res Assessment: Plan:    Schizoaffective disorder, bipolar type (HCC) - Plan: ziprasidone  (GEODON ) 40 MG capsule, lamoTRIgine  (LAMICTAL ) 100 MG tablet, iloperidone  (FANAPT ) 4 MG TABS tablet  Insomnia due to other mental disorder - Plan: LORazepam  (ATIVAN ) 0.5 MG tablet  Mild cognitive impairment - Plan: donepezil  (ARICEPT ) 10 MG tablet   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 It does not appear severe enough to warrant additional medication.  30 min face to face time with patient.   K has a long history of repeated episodes of paranoia associated with hypomania at times and at  times independent of mood changes.  WE discussed her med-sensitivity.  As noted above she has tried multiple different antipsychotics and different antipsychotic combinations.   Discussed potential metabolic side effects associated with atypical antipsychotics, as well as potential risk for movement side effects. Advised pt to contact office if movement side effects occur.  She has mild dysarthria which may be related but it is no worse and it stable.  She is very specifically aware of the risk of tardive dyskinesia.   The unusual combination of antipsychotics is helpful in that the Fanapt  actually offsets some of the dystonia that the Geodon  tends to cause.  Lower dose of antipsychotics has led to worsening paranoia.  Her paranoia is well controlled at the current combination of medications. 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  She doesn't want to treat right now.    Suportive therapy around self care re; caretaking.  We discussed the short-term risks associated with benzodiazepines including sedation and increased fall risk among others.  Discussed long-term side effect risk including  dependence, potential withdrawal symptoms, and the potential eventual dose-related risk of dementia.  But recent studies from 2020 dispute this association between benzodiazepines and dementia risk. Newer studies in 2020 do not support an association with dementia.  We have tried to taper her off benzodiazepines for sleep and she was able to be off for a short period of time but then had recurrence of insomnia.  She is aware of the risks but the benefit for sleep is noteworthy and she is failed multiple other sleep meds.   Temazepam  quit helping to get to sleep.  Continue ziprasidone  40, Fanapt  4 mg 1/4 tablet daily,  lamotrigine  100 Aricept  10 helped memory.  No SE. Still notices benefit. lorazepam  0.5 mg HS for chronic insomnia. Unlikely to comply Reduce time  in bed to 9 hour , her usual sleep time.  CBT  She has had worsening paranoia at lower doses of ziprasidone  in the past.  No problems at 40 mg daily  Follow-up 6 months  Lorene Macintosh MD, DFAPA   Please see After Visit Summary for patient specific instructions.  Future Appointments  Date Time Provider Department Center  12/02/2024  2:00 PM Terra Fairy JINNY [facility]ON[facility]  03/30/2025 12:00 PM [facility] PHLEBOTOMIST [facility] None  03/30/2025  2:00 PM [facility]  04/07/2025  2:40 PM Cloretta Arley NOVAK, MD [facility] None    No orders of the defined types were placed in this encounter.      -------------------------------

## 2024-11-18 ENCOUNTER — Other Ambulatory Visit: Payer: Self-pay | Admitting: Cardiology

## 2024-11-18 DIAGNOSIS — I480 Paroxysmal atrial fibrillation: Secondary | ICD-10-CM

## 2024-11-19 NOTE — Telephone Encounter (Signed)
 Eliquis  2.5mg  refill request received. Patient is 84 years old, weight-60.5kg, Crea-1.28 on 03/25/24, Diagnosis-Afib, and last seen by Fairy Heinrich on 06/01/24. Dose is appropriate based on dosing criteria. Will send in refill to requested pharmacy.

## 2024-12-02 ENCOUNTER — Ambulatory Visit (HOSPITAL_COMMUNITY)
Admission: RE | Admit: 2024-12-02 | Discharge: 2024-12-02 | Disposition: A | Discharge status: Home / Self Care | Source: Ambulatory Visit | Attending: Internal Medicine | Admitting: Internal Medicine

## 2024-12-02 ENCOUNTER — Encounter (HOSPITAL_COMMUNITY): Payer: Self-pay | Admitting: Internal Medicine

## 2024-12-02 VITALS — BP 124/60 | HR 93 | Ht 62.0 in | Wt 135.1 lb

## 2024-12-02 DIAGNOSIS — I480 Paroxysmal atrial fibrillation: Secondary | ICD-10-CM

## 2024-12-02 DIAGNOSIS — D6869 Other thrombophilia: Secondary | ICD-10-CM

## 2024-12-02 DIAGNOSIS — I483 Typical atrial flutter: Secondary | ICD-10-CM

## 2024-12-02 MED ORDER — APIXABAN 5 MG PO TABS: 5.0000 mg | ORAL_TABLET | Freq: Two times a day (BID) | ORAL | 6 refills | Status: AC

## 2024-12-02 MED ORDER — APIXABAN 2.5 MG PO TABS: 5.0000 mg | ORAL_TABLET | Freq: Two times a day (BID) | ORAL | 5 refills | Status: DC

## 2024-12-02 NOTE — Patient Instructions (Signed)
 Increase apixaban  (ELIQUIS ) 5 MG twice a day

## 2024-12-02 NOTE — Progress Notes (Addendum)
 "   Primary Care Physician: Kaitlyn Kerns, MD Primary Cardiologist: None Electrophysiologist: Kaitlyn Gladis Norton, MD     Referring Physician: Dr. Norton Hoover Kaitlyn Hoover is a 84 y.o. 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 Seen by Dr. Norton on 9/17 and noted to be in atrial flutter. She is s/p successful DCCV on 08/06/23. Patient is on Eliquis  2.5 mg BID for a CHADS2VASC score of 5.   Follow-up 12/02/2024.  Patient is currently in atrial flutter.  She does not have cardiac awareness. No clear trigger for current episode. Patient missed a dose of Eliquis  this morning.  Today, she denies symptoms of palpitations, chest pain, shortness of breath, orthopnea, PND, lower extremity edema, dizziness, presyncope, syncope, snoring, daytime somnolence, bleeding, or neurologic sequela. The patient is tolerating medications without difficulties and is otherwise without complaint today.    she has a BMI of Body mass index is 24.71 kg/m.Kaitlyn Hoover Filed Weights   12/02/24 1407  Weight: 61.3 kg      Current Outpatient Medications  Medication Sig Dispense Refill   acyclovir  (ZOVIRAX ) 400 MG tablet Take 400 mg at bedtime by mouth.      Ascorbic Acid  (VITAMIN C ) 1000 MG tablet Take 1,000 mg by mouth daily.     azelastine (OPTIVAR) 0.05 % ophthalmic solution Place 2 drops into both eyes daily as needed. Inching eyes     b complex vitamins capsule Take 1 capsule by mouth daily.     cholecalciferol (VITAMIN D ) 25 MCG (1000 UNIT) tablet Take 1,000 Units by mouth daily.     Coenzyme Q10 (CO Q-10) 100 MG CAPS Take 100 mg by mouth daily.      diltiazem  (CARDIZEM  CD) 180 MG 24 hr capsule Take 180 mg by mouth daily.     donepezil  (ARICEPT ) 10 MG tablet Take 1 tablet (10 mg total) by mouth at bedtime. 90 tablet 3    famotidine  (PEPCID ) 20 MG tablet Take 20 mg by mouth daily.     ferrous sulfate 324 MG TBEC Take 324 mg by mouth daily with breakfast.     fluticasone  (FLONASE ) 50 MCG/ACT nasal spray Place 2 sprays into both nostrils daily.     Ginkgo Biloba 120 MG TABS Take 240 mg by mouth daily.     iloperidone  (FANAPT ) 4 MG TABS tablet Take 1 tablet (4 mg total) by mouth at bedtime. 90 tablet 1   lamoTRIgine  (LAMICTAL ) 100 MG tablet Take 1 tablet (100 mg total) by mouth daily. 90 tablet 1   levothyroxine  (SYNTHROID , LEVOTHROID) 100 MCG tablet Take 100 mcg by mouth daily before breakfast.      LORazepam  (ATIVAN ) 0.5 MG tablet Take 1 tablet (0.5 mg total) by mouth at bedtime. 30 tablet 5   MAGNESIUM  PO Take 250 mg by mouth every evening.     Melatonin 3 MG TABS Take 3 mg by mouth at bedtime.     Multiple Vitamin (MULTIVITAMIN) tablet Take 1 tablet by mouth daily.     Omega-3 Fatty Acids (FISH OIL  BURP-LESS) 1000 MG CAPS Take 3,000 mg by mouth 2 (two) times daily.     oxybutynin  (DITROPAN  XL) 15 MG 24 hr tablet Take 15 mg by mouth daily.      rosuvastatin  (CRESTOR ) 10 MG tablet TAKE 1 TABLET BY MOUTH UP TO 4 TIMES A WEEK  ON SUNDAY, MONDAY,WEDNESDAY AND FRIDAY 48 tablet 2   sodium chloride  (OCEAN) 0.65 % SOLN nasal spray Place 2 sprays into both nostrils every evening.     Turmeric 500 MG CAPS Take 500 mg by mouth daily.     vitamin B-12 (CYANOCOBALAMIN) 1000 MCG tablet Take 1,000 mcg by mouth daily.     ziprasidone  (GEODON ) 40 MG capsule Take 1 capsule (40 mg total) by mouth daily at 12 noon. 90 capsule 1   apixaban  (ELIQUIS ) 5 MG TABS tablet Take 1 tablet (5 mg total) by mouth 2 (two) times daily. 60 tablet 6   No current facility-administered medications for this encounter.    Atrial Fibrillation Management history:  Previous antiarrhythmic drugs:  Previous cardioversions: 2018, 08/06/23 Previous ablations: Afib 2018, SVT 2019 Anticoagulation history: Eliquis    ROS- All systems are reviewed and  negative except as per the HPI above.  Physical Exam: BP 124/60   Pulse 93   Ht 5' 2 (1.575 m)   Wt 61.3 kg   LMP  (LMP Unknown)   BMI 24.71 kg/m   GEN- The patient is well appearing, alert and oriented x 3 today.   Neck - no JVD or carotid bruit noted Lungs- Clear to ausculation bilaterally, normal work of breathing Heart- Irregular rate and rhythm, no murmurs, rubs or gallops, PMI not laterally displaced Extremities- no clubbing, cyanosis, or edema Skin - no rash or ecchymosis noted    EKG today demonstrates  EKG Interpretation Date/Time:  Thursday December 02 2024 14:09:38 EST Ventricular Rate:  93 PR Interval:    QRS Duration:  74 QT Interval:  384 QTC Calculation: 477 R Axis:   60  Text Interpretation: Atrial flutter with variable AV block Nonspecific T wave abnormality Prolonged QT Abnormal ECG When compared with ECG of 01-Jun-2024 13:37,  Atrial flutter has replaced sinus rhythm Reconfirmed by Kaitlyn Hoover ([id number]) on 12/02/2024 2:25:16 PM    Echo 03/2017 demonstrated  Study Conclusions   - Left ventricle: The cavity size was normal. Wall thickness was    normal. Systolic function was normal. The estimated ejection    fraction was in the range of 60% to 65%. Wall motion was normal;    there were no regional wall motion abnormalities. Features are    consistent with a pseudonormal left ventricular filling pattern,    with concomitant abnormal relaxation and increased filling    pressure (grade 2 diastolic dysfunction).  - Aortic valve: There was no stenosis.  - Mitral valve: Mildly calcified annulus. There was trivial    regurgitation.  - Left atrium: The atrium was moderately dilated.  - Right ventricle: The cavity size was normal. Systolic function    was normal.  - Tricuspid valve: Peak RV-RA gradient (S): 28 mm Hg.  - Pulmonary arteries: PA peak pressure: 31 mm Hg (S).  - Inferior vena cava: The vessel was normal in size. The    respirophasic diameter  changes were in the normal range (>= 50%),    consistent with normal central venous pressure.  - Pericardium, extracardiac: A trivial pericardial effusion was    identified.   CHA2DS2-VASc Score = 5  The patient's score is based upon: CHF History: 0 HTN History: 1 Diabetes History: 0 Stroke History: 0 Vascular Disease History: 1 Age Score: 2 Gender Score: 1       ASSESSMENT AND PLAN: Paroxysmal Atrial Fibrillation (ICD10:  I48.0) / Atrial flutter The patient's CHA2DS2-VASc score is 5, indicating a 7.2% annual risk of stroke.  S/p successful DCCV on 08/06/23.   Patient is currently in atrial flutter.  We discussed cardioversion as a procedure to try to restore sinus rhythm. Patient missed a dose of Eliquis  this morning so would need to wait for 3 weeks of uninterrupted anticoagulation. Also, patient is taking Eliquis  2.5 mg but should be on 5 mg dosage due to weight > 60 kg and creatinine less than 1.5. Her creatinine was higher than 1.5 in 02/2024 but has not been that high since then.  Secondary Hypercoagulable State (ICD10:  D68.69) The patient is at significant risk for stroke/thromboembolism based upon her CHA2DS2-VASc Score of 5.  Continue Apixaban  (Eliquis ).  Transition patient to Eliquis  5 mg BID in anticipation of possible cardioversion. If her weight goes below 60 kg, Kaitlyn transition patient back to 2.5 mg.   Hypertension Stable today.     Follow up 3 weeks to determine if needs cardioversion.    Kaitlyn Pac, PA-C  [facility] [address] [place], [place] [address] [phone]  "

## 2024-12-23 ENCOUNTER — Ambulatory Visit (HOSPITAL_COMMUNITY)
Admission: RE | Admit: 2024-12-23 | Discharge: 2024-12-23 | Disposition: A | Source: Ambulatory Visit | Attending: Internal Medicine | Admitting: Internal Medicine

## 2024-12-23 ENCOUNTER — Encounter (HOSPITAL_COMMUNITY): Payer: Self-pay | Admitting: Internal Medicine

## 2024-12-23 VITALS — BP 138/60 | HR 74 | Ht 62.0 in | Wt 138.4 lb

## 2024-12-23 DIAGNOSIS — I4891 Unspecified atrial fibrillation: Secondary | ICD-10-CM | POA: Insufficient documentation

## 2024-12-23 DIAGNOSIS — I480 Paroxysmal atrial fibrillation: Secondary | ICD-10-CM | POA: Diagnosis not present

## 2024-12-23 DIAGNOSIS — D6869 Other thrombophilia: Secondary | ICD-10-CM

## 2024-12-23 DIAGNOSIS — I4819 Other persistent atrial fibrillation: Secondary | ICD-10-CM

## 2024-12-23 DIAGNOSIS — I4892 Unspecified atrial flutter: Secondary | ICD-10-CM | POA: Diagnosis not present

## 2024-12-23 NOTE — Progress Notes (Addendum)
 "   Primary Care Physician: Orion Kerns, MD Primary Cardiologist: None Electrophysiologist: Will Gladis Norton, MD     Referring Physician: Dr. Norton Moccasin Kaitlyn Hoover is a 84 y.o. 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 Seen by Dr. Norton on 9/17 and noted to be in atrial flutter. She is s/p successful DCCV on 08/06/23. Patient is on Eliquis  2.5 mg BID for a CHADS2VASC score of 5.  Follow-up 12/23/2024.  Patient is currently in atrial flutter.  She has not missed any doses of Eliquis  since transition to 5 mg twice daily.  Today, she denies symptoms of palpitations, chest pain, shortness of breath, orthopnea, PND, lower extremity edema, dizziness, presyncope, syncope, snoring, daytime somnolence, bleeding, or neurologic sequela. The patient is tolerating medications without difficulties and is otherwise without complaint today.    she has a BMI of Body mass index is 25.31 kg/m.[name] Filed Weights   12/23/24 1434  Weight: 62.8 kg     Current Outpatient Medications  Medication Sig Dispense Refill   acyclovir  (ZOVIRAX ) 400 MG tablet Take 400 mg at bedtime by mouth.      apixaban  (ELIQUIS ) 5 MG TABS tablet Take 1 tablet (5 mg total) by mouth 2 (two) times daily. 60 tablet 6   Ascorbic Acid  (VITAMIN C ) 1000 MG tablet Take 1,000 mg by mouth daily.     azelastine (OPTIVAR) 0.05 % ophthalmic solution Place 2 drops into both eyes daily as needed. Inching eyes     b complex vitamins capsule Take 1 capsule by mouth daily.     cholecalciferol (VITAMIN D ) 25 MCG (1000 UNIT) tablet Take 1,000 Units by mouth daily.     Coenzyme Q10 (CO Q-10) 100 MG CAPS Take 100 mg by mouth daily.      diltiazem  (CARDIZEM  CD) 180 MG 24 hr capsule Take 180 mg by mouth daily.     donepezil   (ARICEPT ) 10 MG tablet Take 1 tablet (10 mg total) by mouth at bedtime. 90 tablet 3   famotidine  (PEPCID ) 20 MG tablet Take 20 mg by mouth daily.     ferrous sulfate 324 MG TBEC Take 324 mg by mouth daily with breakfast.     fluticasone  (FLONASE ) 50 MCG/ACT nasal spray Place 2 sprays into both nostrils daily.     Ginkgo Biloba 120 MG TABS Take 240 mg by mouth daily.     iloperidone  (FANAPT ) 4 MG TABS tablet Take 1 tablet (4 mg total) by mouth at bedtime. 90 tablet 1   lamoTRIgine  (LAMICTAL ) 100 MG tablet Take 1 tablet (100 mg total) by mouth daily. 90 tablet 1   levothyroxine  (SYNTHROID , LEVOTHROID) 100 MCG tablet Take 100 mcg by mouth daily before breakfast.      LORazepam  (ATIVAN ) 0.5 MG tablet Take 1 tablet (0.5 mg total) by mouth at bedtime. 30 tablet 5   MAGNESIUM  PO Take 250 mg by mouth every evening.     Melatonin 3 MG TABS Take 3 mg by mouth at bedtime.     Multiple Vitamin (MULTIVITAMIN) tablet Take 1 tablet by mouth daily.     Omega-3 Fatty Acids (FISH OIL  BURP-LESS) 1000 MG CAPS Take 3,000 mg by mouth 2 (two) times daily.     oxybutynin  (DITROPAN  XL) 15 MG 24 hr tablet Take 15 mg by mouth daily.  rosuvastatin  (CRESTOR ) 10 MG tablet TAKE 1 TABLET BY MOUTH UP TO 4 TIMES A WEEK ON SUNDAY, MONDAY,WEDNESDAY AND FRIDAY 48 tablet 2   sodium chloride  (OCEAN) 0.65 % SOLN nasal spray Place 2 sprays into both nostrils every evening.     Turmeric 500 MG CAPS Take 500 mg by mouth daily.     vitamin B-12 (CYANOCOBALAMIN) 1000 MCG tablet Take 1,000 mcg by mouth daily.     ziprasidone  (GEODON ) 40 MG capsule Take 1 capsule (40 mg total) by mouth daily at 12 noon. 90 capsule 1   No current facility-administered medications for this encounter.    Atrial Fibrillation Management history:  Previous antiarrhythmic drugs:  Previous cardioversions: 2018, 08/06/23 Previous ablations: Afib 2018, SVT 2019 Anticoagulation history: Eliquis    ROS- All systems are reviewed and negative except as per the  HPI above.  Physical Exam: BP 138/60   Pulse 74   Ht 5' 2 (1.575 m)   Wt 62.8 kg   LMP  (LMP Unknown)   BMI 25.31 kg/m   GEN- The patient is well appearing, alert and oriented x 3 today.  Involuntary oral movements noted. Neck - no JVD or carotid bruit noted Lungs- Clear to ausculation bilaterally, normal work of breathing Heart- Irregular rate and rhythm, no murmurs, rubs or gallops, PMI not laterally displaced Extremities- no clubbing, cyanosis, or edema Skin - no rash or ecchymosis noted    EKG today demonstrates  EKG Interpretation Date/Time:  Thursday December 23 2024 14:37:23 EST Ventricular Rate:  74 PR Interval:    QRS Duration:  80 QT Interval:  408 QTC Calculation: 452 R Axis:   61  Text Interpretation: Atrial flutter with variable A-V block with premature ventricular or aberrantly conducted complexes Nonspecific ST abnormality Abnormal ECG When compared with ECG of 02-Dec-2024 14:09, PREVIOUS ECG IS PRESENT Confirmed by Terra Pac ([id number]) on 12/23/2024 2:40:19 PM     Echo 03/2017 demonstrated  Study Conclusions   - Left ventricle: The cavity size was normal. Wall thickness was    normal. Systolic function was normal. The estimated ejection    fraction was in the range of 60% to 65%. Wall motion was normal;    there were no regional wall motion abnormalities. Features are    consistent with a pseudonormal left ventricular filling pattern,    with concomitant abnormal relaxation and increased filling    pressure (grade 2 diastolic dysfunction).  - Aortic valve: There was no stenosis.  - Mitral valve: Mildly calcified annulus. There was trivial    regurgitation.  - Left atrium: The atrium was moderately dilated.  - Right ventricle: The cavity size was normal. Systolic function    was normal.  - Tricuspid valve: Peak RV-RA gradient (S): 28 mm Hg.  - Pulmonary arteries: PA peak pressure: 31 mm Hg (S).  - Inferior vena cava: The vessel was normal in size. The     respirophasic diameter changes were in the normal range (>= 50%),    consistent with normal central venous pressure.  - Pericardium, extracardiac: A trivial pericardial effusion was    identified.   CHA2DS2-VASc Score = 5  The patient's score is based upon: CHF History: 0 HTN History: 1 Diabetes History: 0 Stroke History: 0 Vascular Disease History: 1 Age Score: 2 Gender Score: 1       ASSESSMENT AND PLAN: Persistent Atrial Fibrillation (ICD10:  I48.0) / Atrial flutter The patient's CHA2DS2-VASc score is 5, indicating a 7.2% annual risk of stroke.  S/p successful DCCV on 08/06/23.   Patient is currently in atrial flutter.  She has not missed any doses of Eliquis  since patient was transitioned to the appropriate dose of 5 mg. We discussed the procedure cardioversion to try to convert to NSR. We discussed the risks vs benefits of this procedure and how ultimately we cannot predict whether a patient will have early return of arrhythmia post procedure. After discussion, the patient wishes to proceed with cardioversion. Labs drawn today.   Informed Consent   Shared Decision Making/Informed Consent The risks (stroke, cardiac arrhythmias rarely resulting in the need for a temporary or permanent pacemaker, skin irritation or burns and complications associated with conscious sedation including aspiration, arrhythmia, respiratory failure and death), benefits (restoration of normal sinus rhythm) and alternatives of a direct current cardioversion were explained in detail to Ms. Racette and she agrees to proceed.       If patient has ERAF, there are multiple interactions with patient's Geodon  and Fanapt  with potential antiarrhythmic medication therapy.   Secondary Hypercoagulable State (ICD10:  D68.69) The patient is at significant risk for stroke/thromboembolism based upon her CHA2DS2-VASc Score of 5.  Continue Apixaban  (Eliquis ).  No missed doses of Eliquis  5 mg twice  daily.  Hypertension Stable today.    Follow up after DCCV with Dr. Inocencio.   Terra Pac, PA-C  [facility] [address] [place], [place] [address] [phone]  "

## 2024-12-23 NOTE — Patient Instructions (Addendum)
 Cardioversion scheduled for: 12/29/24 Wednesday at 9:00 am   - Arrive at the [facility] ([address]  and check in with ADMITTING at 9:00 am    - Do not eat or drink anything after midnight the night prior to your procedure.   - Take all your morning medication (except diabetic medications) with a sip of water prior to arrival.  - Do NOT miss any doses of your blood thinner - if you should miss a dose or take a dose more than 4 hours late -- please notify our office immediately.  - You will not be able to drive home after your procedure. Please ensure you have a responsible adult to drive you home. You will need someone with you for 24 hours post procedure.     - Expect to be in the procedural area approximately 2 hours.   - If you feel as if you go back into normal rhythm prior to scheduled cardioversion, please notify our office immediately.   If your procedure is canceled in the cardioversion suite you will be charged a cancellation fee.

## 2024-12-24 ENCOUNTER — Ambulatory Visit (HOSPITAL_COMMUNITY): Payer: Self-pay | Admitting: Internal Medicine

## 2024-12-24 LAB — BASIC METABOLIC PANEL WITH GFR
BUN/Creatinine Ratio: 15 (ref 12–28)
BUN: 18 mg/dL (ref 8–27)
CO2: 23 mmol/L (ref 20–29)
Calcium: 9.2 mg/dL (ref 8.7–10.3)
Chloride: 107 mmol/L — ABNORMAL HIGH (ref 96–106)
Creatinine, Ser: 1.2 mg/dL — ABNORMAL HIGH (ref 0.57–1.00)
Glucose: 94 mg/dL (ref 70–99)
Potassium: 4.3 mmol/L (ref 3.5–5.2)
Sodium: 146 mmol/L — ABNORMAL HIGH (ref 134–144)
eGFR: 45 mL/min/{1.73_m2} — ABNORMAL LOW

## 2024-12-24 LAB — CBC
Hematocrit: 40.5 % (ref 34.0–46.6)
Hemoglobin: 13 g/dL (ref 11.1–15.9)
MCH: 30.7 pg (ref 26.6–33.0)
MCHC: 32.1 g/dL (ref 31.5–35.7)
MCV: 96 fL (ref 79–97)
Platelets: 215 10*3/uL (ref 150–450)
RBC: 4.24 x10E6/uL (ref 3.77–5.28)
RDW: 12.4 % (ref 11.7–15.4)
WBC: 7.3 10*3/uL (ref 3.4–10.8)

## 2024-12-29 ENCOUNTER — Encounter (HOSPITAL_COMMUNITY): Payer: Self-pay

## 2024-12-29 ENCOUNTER — Ambulatory Visit (HOSPITAL_COMMUNITY): Admit: 2024-12-29 | Admitting: Cardiology

## 2025-01-20 ENCOUNTER — Ambulatory Visit: Admitting: Cardiology

## 2025-03-30 ENCOUNTER — Other Ambulatory Visit (HOSPITAL_BASED_OUTPATIENT_CLINIC_OR_DEPARTMENT_OTHER)

## 2025-03-30 ENCOUNTER — Inpatient Hospital Stay

## 2025-04-07 ENCOUNTER — Ambulatory Visit: Admitting: Psychiatry

## 2025-04-07 ENCOUNTER — Inpatient Hospital Stay: Admitting: Oncology
